# Patient Record
Sex: Male | Born: 1935 | Race: White | Hispanic: No | Marital: Married | Smoking: Former smoker
Health system: Southern US, Community
[De-identification: ages and names within clinical notes are randomized; demographics above are authoritative.]

## PROBLEM LIST (undated history)

## (undated) DIAGNOSIS — I4892 Unspecified atrial flutter: Secondary | ICD-10-CM

## (undated) DIAGNOSIS — M1990 Unspecified osteoarthritis, unspecified site: Secondary | ICD-10-CM

## (undated) DIAGNOSIS — T148XXA Other injury of unspecified body region, initial encounter: Secondary | ICD-10-CM

## (undated) DIAGNOSIS — K219 Gastro-esophageal reflux disease without esophagitis: Secondary | ICD-10-CM

## (undated) DIAGNOSIS — C801 Malignant (primary) neoplasm, unspecified: Secondary | ICD-10-CM

## (undated) DIAGNOSIS — I10 Essential (primary) hypertension: Secondary | ICD-10-CM

## (undated) DIAGNOSIS — E669 Obesity, unspecified: Secondary | ICD-10-CM

## (undated) DIAGNOSIS — I2510 Atherosclerotic heart disease of native coronary artery without angina pectoris: Secondary | ICD-10-CM

## (undated) DIAGNOSIS — C189 Malignant neoplasm of colon, unspecified: Secondary | ICD-10-CM

## (undated) DIAGNOSIS — Z950 Presence of cardiac pacemaker: Secondary | ICD-10-CM

## (undated) DIAGNOSIS — N189 Chronic kidney disease, unspecified: Principal | ICD-10-CM

## (undated) DIAGNOSIS — R569 Unspecified convulsions: Secondary | ICD-10-CM

## (undated) DIAGNOSIS — I259 Chronic ischemic heart disease, unspecified: Secondary | ICD-10-CM

## (undated) DIAGNOSIS — G4730 Sleep apnea, unspecified: Secondary | ICD-10-CM

## (undated) DIAGNOSIS — I495 Sick sinus syndrome: Secondary | ICD-10-CM

## (undated) DIAGNOSIS — K649 Unspecified hemorrhoids: Secondary | ICD-10-CM

## (undated) DIAGNOSIS — D631 Anemia in chronic kidney disease: Principal | ICD-10-CM

## (undated) DIAGNOSIS — N289 Disorder of kidney and ureter, unspecified: Secondary | ICD-10-CM

## (undated) DIAGNOSIS — I209 Angina pectoris, unspecified: Secondary | ICD-10-CM

## (undated) DIAGNOSIS — H269 Unspecified cataract: Secondary | ICD-10-CM

## (undated) DIAGNOSIS — E785 Hyperlipidemia, unspecified: Secondary | ICD-10-CM

## (undated) DIAGNOSIS — I219 Acute myocardial infarction, unspecified: Secondary | ICD-10-CM

## (undated) HISTORY — DX: Hyperlipidemia, unspecified: E78.5

## (undated) HISTORY — DX: Unspecified atrial flutter: I48.92

## (undated) HISTORY — DX: Chronic ischemic heart disease, unspecified: I25.9

## (undated) HISTORY — DX: Obesity, unspecified: E66.9

## (undated) HISTORY — DX: Disorder of kidney and ureter, unspecified: N28.9

## (undated) HISTORY — DX: Atherosclerotic heart disease of native coronary artery without angina pectoris: I25.10

## (undated) HISTORY — DX: Presence of cardiac pacemaker: Z95.0

## (undated) HISTORY — DX: Sick sinus syndrome: I49.5

## (undated) HISTORY — DX: Malignant neoplasm of colon, unspecified: C18.9

## (undated) HISTORY — DX: Chronic kidney disease, unspecified: N18.9

## (undated) HISTORY — PX: FRACTURE SURGERY: SHX138

## (undated) HISTORY — DX: Anemia in chronic kidney disease: D63.1

---

## 1984-05-21 DIAGNOSIS — I219 Acute myocardial infarction, unspecified: Secondary | ICD-10-CM

## 1984-05-21 HISTORY — DX: Acute myocardial infarction, unspecified: I21.9

## 1995-05-22 DIAGNOSIS — I495 Sick sinus syndrome: Secondary | ICD-10-CM

## 1995-05-22 HISTORY — DX: Sick sinus syndrome: I49.5

## 1995-05-22 HISTORY — PX: PACEMAKER REMOVAL: SHX5066

## 1997-09-17 ENCOUNTER — Other Ambulatory Visit: Admission: RE | Admit: 1997-09-17 | Discharge: 1997-09-17 | Payer: Self-pay | Admitting: Cardiology

## 1999-08-29 ENCOUNTER — Inpatient Hospital Stay (HOSPITAL_COMMUNITY): Admission: EM | Admit: 1999-08-29 | Discharge: 1999-08-31 | Payer: Self-pay | Admitting: Emergency Medicine

## 1999-08-29 ENCOUNTER — Encounter: Payer: Self-pay | Admitting: Emergency Medicine

## 2003-05-22 HISTORY — PX: COLON SURGERY: SHX602

## 2003-11-19 DIAGNOSIS — C189 Malignant neoplasm of colon, unspecified: Secondary | ICD-10-CM

## 2003-11-19 HISTORY — DX: Malignant neoplasm of colon, unspecified: C18.9

## 2003-11-23 ENCOUNTER — Ambulatory Visit (HOSPITAL_COMMUNITY): Admission: RE | Admit: 2003-11-23 | Discharge: 2003-11-23 | Payer: Self-pay | Admitting: Gastroenterology

## 2003-11-23 ENCOUNTER — Encounter (INDEPENDENT_AMBULATORY_CARE_PROVIDER_SITE_OTHER): Payer: Self-pay | Admitting: Specialist

## 2003-12-03 ENCOUNTER — Encounter: Admission: RE | Admit: 2003-12-03 | Discharge: 2003-12-03 | Payer: Self-pay | Admitting: General Surgery

## 2003-12-04 HISTORY — PX: LAPAROTOMY: SHX154

## 2003-12-14 ENCOUNTER — Encounter (INDEPENDENT_AMBULATORY_CARE_PROVIDER_SITE_OTHER): Payer: Self-pay | Admitting: *Deleted

## 2003-12-14 ENCOUNTER — Inpatient Hospital Stay (HOSPITAL_COMMUNITY): Admission: RE | Admit: 2003-12-14 | Discharge: 2003-12-21 | Payer: Self-pay | Admitting: General Surgery

## 2003-12-27 ENCOUNTER — Ambulatory Visit (HOSPITAL_COMMUNITY): Admission: RE | Admit: 2003-12-27 | Discharge: 2003-12-27 | Payer: Self-pay | Admitting: General Surgery

## 2004-01-06 ENCOUNTER — Ambulatory Visit (HOSPITAL_COMMUNITY): Admission: RE | Admit: 2004-01-06 | Discharge: 2004-01-06 | Payer: Self-pay | Admitting: Oncology

## 2004-04-04 ENCOUNTER — Ambulatory Visit: Payer: Self-pay | Admitting: Oncology

## 2004-04-10 ENCOUNTER — Ambulatory Visit (HOSPITAL_COMMUNITY): Admission: RE | Admit: 2004-04-10 | Discharge: 2004-04-10 | Payer: Self-pay | Admitting: Oncology

## 2004-05-21 HISTORY — PX: INSERT / REPLACE / REMOVE PACEMAKER: SUR710

## 2004-07-11 ENCOUNTER — Ambulatory Visit: Payer: Self-pay | Admitting: Oncology

## 2004-07-14 ENCOUNTER — Ambulatory Visit (HOSPITAL_COMMUNITY): Admission: RE | Admit: 2004-07-14 | Discharge: 2004-07-14 | Payer: Self-pay | Admitting: Oncology

## 2004-10-25 ENCOUNTER — Ambulatory Visit: Payer: Self-pay | Admitting: Oncology

## 2004-10-27 ENCOUNTER — Ambulatory Visit (HOSPITAL_COMMUNITY): Admission: RE | Admit: 2004-10-27 | Discharge: 2004-10-27 | Payer: Self-pay | Admitting: Oncology

## 2004-11-28 ENCOUNTER — Encounter: Admission: RE | Admit: 2004-11-28 | Discharge: 2004-11-28 | Payer: Self-pay | Admitting: Cardiology

## 2005-01-03 ENCOUNTER — Encounter: Admission: RE | Admit: 2005-01-03 | Discharge: 2005-01-03 | Payer: Self-pay | Admitting: Otolaryngology

## 2005-01-03 ENCOUNTER — Encounter (INDEPENDENT_AMBULATORY_CARE_PROVIDER_SITE_OTHER): Payer: Self-pay | Admitting: Specialist

## 2005-01-03 ENCOUNTER — Other Ambulatory Visit: Admission: RE | Admit: 2005-01-03 | Discharge: 2005-01-03 | Payer: Self-pay | Admitting: Interventional Radiology

## 2005-03-02 ENCOUNTER — Ambulatory Visit (HOSPITAL_COMMUNITY): Admission: RE | Admit: 2005-03-02 | Discharge: 2005-03-02 | Payer: Self-pay | Admitting: *Deleted

## 2005-04-20 ENCOUNTER — Ambulatory Visit: Payer: Self-pay | Admitting: Oncology

## 2005-04-25 ENCOUNTER — Ambulatory Visit (HOSPITAL_COMMUNITY): Admission: RE | Admit: 2005-04-25 | Discharge: 2005-04-25 | Payer: Self-pay | Admitting: Urology

## 2005-04-30 ENCOUNTER — Ambulatory Visit: Admission: RE | Admit: 2005-04-30 | Discharge: 2005-07-11 | Payer: Self-pay | Admitting: Radiation Oncology

## 2005-05-21 HISTORY — PX: RADIOACTIVE SEED IMPLANT: SHX5150

## 2005-06-06 ENCOUNTER — Ambulatory Visit (HOSPITAL_BASED_OUTPATIENT_CLINIC_OR_DEPARTMENT_OTHER): Admission: RE | Admit: 2005-06-06 | Discharge: 2005-06-06 | Payer: Self-pay | Admitting: Urology

## 2005-10-17 ENCOUNTER — Ambulatory Visit: Payer: Self-pay | Admitting: Oncology

## 2005-10-23 LAB — COMPREHENSIVE METABOLIC PANEL
Albumin: 4 g/dL (ref 3.5–5.2)
BUN: 23 mg/dL (ref 6–23)
CO2: 25 mEq/L (ref 19–32)
Calcium: 8.8 mg/dL (ref 8.4–10.5)
Chloride: 99 mEq/L (ref 96–112)
Glucose, Bld: 325 mg/dL — ABNORMAL HIGH (ref 70–99)
Potassium: 4.7 mEq/L (ref 3.5–5.3)

## 2005-10-23 LAB — CBC WITH DIFFERENTIAL/PLATELET
Basophils Absolute: 0 10*3/uL (ref 0.0–0.1)
Eosinophils Absolute: 0.1 10*3/uL (ref 0.0–0.5)
HGB: 12.8 g/dL — ABNORMAL LOW (ref 13.0–17.1)
MCV: 91.4 fL (ref 81.6–98.0)
MONO#: 0.6 10*3/uL (ref 0.1–0.9)
MONO%: 6.6 % (ref 0.0–13.0)
NEUT#: 7 10*3/uL — ABNORMAL HIGH (ref 1.5–6.5)
RDW: 13 % (ref 11.2–14.6)
WBC: 8.7 10*3/uL (ref 4.0–10.0)
lymph#: 1 10*3/uL (ref 0.9–3.3)

## 2005-10-23 LAB — CEA: CEA: 2.6 ng/mL (ref 0.0–5.0)

## 2005-10-23 LAB — LACTATE DEHYDROGENASE: LDH: 137 U/L (ref 94–250)

## 2005-10-25 ENCOUNTER — Ambulatory Visit (HOSPITAL_COMMUNITY): Admission: RE | Admit: 2005-10-25 | Discharge: 2005-10-25 | Payer: Self-pay | Admitting: Oncology

## 2005-10-30 ENCOUNTER — Ambulatory Visit: Payer: Self-pay | Admitting: Oncology

## 2006-03-20 ENCOUNTER — Inpatient Hospital Stay (HOSPITAL_COMMUNITY): Admission: RE | Admit: 2006-03-20 | Discharge: 2006-03-21 | Payer: Self-pay | Admitting: Cardiovascular Disease

## 2006-05-01 ENCOUNTER — Inpatient Hospital Stay (HOSPITAL_COMMUNITY): Admission: EM | Admit: 2006-05-01 | Discharge: 2006-05-12 | Payer: Self-pay | Admitting: Emergency Medicine

## 2006-05-01 ENCOUNTER — Encounter: Payer: Self-pay | Admitting: Vascular Surgery

## 2006-05-01 HISTORY — PX: CARDIAC CATHETERIZATION: SHX172

## 2006-05-06 DIAGNOSIS — I259 Chronic ischemic heart disease, unspecified: Secondary | ICD-10-CM

## 2006-05-06 HISTORY — PX: CORONARY ARTERY BYPASS GRAFT: SHX141

## 2006-05-06 HISTORY — DX: Chronic ischemic heart disease, unspecified: I25.9

## 2006-06-27 ENCOUNTER — Ambulatory Visit: Payer: Self-pay | Admitting: Cardiothoracic Surgery

## 2006-07-05 ENCOUNTER — Ambulatory Visit (HOSPITAL_COMMUNITY): Admission: RE | Admit: 2006-07-05 | Discharge: 2006-07-05 | Payer: Self-pay | Admitting: Cardiology

## 2006-10-04 ENCOUNTER — Ambulatory Visit: Payer: Self-pay | Admitting: Oncology

## 2006-10-08 LAB — COMPREHENSIVE METABOLIC PANEL
ALT: 14 U/L (ref 0–53)
AST: 15 U/L (ref 0–37)
CO2: 28 mEq/L (ref 19–32)
Sodium: 142 mEq/L (ref 135–145)
Total Bilirubin: 0.7 mg/dL (ref 0.3–1.2)
Total Protein: 6.7 g/dL (ref 6.0–8.3)

## 2006-10-08 LAB — CBC WITH DIFFERENTIAL/PLATELET
BASO%: 0.4 % (ref 0.0–2.0)
LYMPH%: 13.7 % — ABNORMAL LOW (ref 14.0–48.0)
MCHC: 34.7 g/dL (ref 32.0–35.9)
MONO#: 0.5 10*3/uL (ref 0.1–0.9)
Platelets: 156 10*3/uL (ref 145–400)
RBC: 3.57 10*6/uL — ABNORMAL LOW (ref 4.20–5.71)
WBC: 5.4 10*3/uL (ref 4.0–10.0)
lymph#: 0.7 10*3/uL — ABNORMAL LOW (ref 0.9–3.3)

## 2006-10-15 ENCOUNTER — Ambulatory Visit: Payer: Self-pay | Admitting: Oncology

## 2006-10-16 LAB — CBC WITH DIFFERENTIAL (CANCER CENTER ONLY)
BASO#: 0 10*3/uL (ref 0.0–0.2)
Eosinophils Absolute: 0.1 10*3/uL (ref 0.0–0.5)
HGB: 11.2 g/dL — ABNORMAL LOW (ref 13.0–17.1)
MCH: 30.9 pg (ref 28.0–33.4)
MCV: 90 fL (ref 82–98)
MONO%: 6.7 % (ref 0.0–13.0)
NEUT#: 3.8 10*3/uL (ref 1.5–6.5)
RBC: 3.62 10*6/uL — ABNORMAL LOW (ref 4.20–5.70)

## 2006-10-17 LAB — COMPREHENSIVE METABOLIC PANEL
BUN: 23 mg/dL (ref 6–23)
CO2: 28 mEq/L (ref 19–32)
Creatinine, Ser: 1.48 mg/dL (ref 0.40–1.50)
Glucose, Bld: 107 mg/dL — ABNORMAL HIGH (ref 70–99)
Total Bilirubin: 1 mg/dL (ref 0.3–1.2)

## 2006-10-17 LAB — IRON AND TIBC
%SAT: 18 % — ABNORMAL LOW (ref 20–55)
Iron: 69 ug/dL (ref 42–165)
TIBC: 389 ug/dL (ref 215–435)

## 2006-10-17 LAB — VITAMIN B12: Vitamin B-12: 489 pg/mL (ref 211–911)

## 2006-10-17 LAB — CEA: CEA: 2.7 ng/mL (ref 0.0–5.0)

## 2007-02-20 ENCOUNTER — Encounter: Admission: RE | Admit: 2007-02-20 | Discharge: 2007-02-20 | Payer: Self-pay | Admitting: Gastroenterology

## 2007-02-20 ENCOUNTER — Inpatient Hospital Stay (HOSPITAL_COMMUNITY): Admission: AD | Admit: 2007-02-20 | Discharge: 2007-02-22 | Payer: Self-pay | Admitting: Gastroenterology

## 2007-04-03 ENCOUNTER — Ambulatory Visit: Payer: Self-pay | Admitting: Oncology

## 2007-04-07 LAB — CBC WITH DIFFERENTIAL/PLATELET
BASO%: 0.6 % (ref 0.0–2.0)
LYMPH%: 13.6 % — ABNORMAL LOW (ref 14.0–48.0)
MCHC: 35 g/dL (ref 32.0–35.9)
MCV: 91.5 fL (ref 81.6–98.0)
MONO%: 8.5 % (ref 0.0–13.0)
Platelets: 166 10*3/uL (ref 145–400)
RBC: 3.52 10*6/uL — ABNORMAL LOW (ref 4.20–5.71)

## 2007-04-07 LAB — COMPREHENSIVE METABOLIC PANEL
Alkaline Phosphatase: 62 U/L (ref 39–117)
Creatinine, Ser: 1.99 mg/dL — ABNORMAL HIGH (ref 0.40–1.50)
Glucose, Bld: 123 mg/dL — ABNORMAL HIGH (ref 70–99)
Sodium: 141 mEq/L (ref 135–145)
Total Bilirubin: 0.6 mg/dL (ref 0.3–1.2)
Total Protein: 6.8 g/dL (ref 6.0–8.3)

## 2007-04-09 ENCOUNTER — Ambulatory Visit (HOSPITAL_COMMUNITY): Admission: RE | Admit: 2007-04-09 | Discharge: 2007-04-09 | Payer: Self-pay | Admitting: Oncology

## 2007-04-15 ENCOUNTER — Ambulatory Visit: Payer: Self-pay | Admitting: Oncology

## 2007-10-02 ENCOUNTER — Ambulatory Visit: Payer: Self-pay | Admitting: Oncology

## 2007-10-06 LAB — CBC WITH DIFFERENTIAL/PLATELET
BASO%: 0.4 % (ref 0.0–2.0)
Basophils Absolute: 0 10*3/uL (ref 0.0–0.1)
Eosinophils Absolute: 0.2 10*3/uL (ref 0.0–0.5)
HCT: 32.9 % — ABNORMAL LOW (ref 38.7–49.9)
HGB: 11.4 g/dL — ABNORMAL LOW (ref 13.0–17.1)
LYMPH%: 15.7 % (ref 14.0–48.0)
MCHC: 34.7 g/dL (ref 32.0–35.9)
MONO#: 0.6 10*3/uL (ref 0.1–0.9)
NEUT%: 72.4 % (ref 40.0–75.0)
Platelets: 160 10*3/uL (ref 145–400)
WBC: 6.4 10*3/uL (ref 4.0–10.0)

## 2007-10-06 LAB — COMPREHENSIVE METABOLIC PANEL
ALT: 14 U/L (ref 0–53)
Albumin: 4.1 g/dL (ref 3.5–5.2)
Alkaline Phosphatase: 50 U/L (ref 39–117)
CO2: 26 mEq/L (ref 19–32)
Glucose, Bld: 107 mg/dL — ABNORMAL HIGH (ref 70–99)
Potassium: 4.3 mEq/L (ref 3.5–5.3)
Sodium: 139 mEq/L (ref 135–145)
Total Protein: 6.9 g/dL (ref 6.0–8.3)

## 2007-10-06 LAB — CEA: CEA: 3.8 ng/mL (ref 0.0–5.0)

## 2007-10-15 ENCOUNTER — Ambulatory Visit: Payer: Self-pay | Admitting: Oncology

## 2007-10-16 LAB — PSA: PSA: 13.19 ng/mL — ABNORMAL HIGH (ref 0.10–4.00)

## 2007-10-16 LAB — IRON AND TIBC: %SAT: 26 % (ref 20–55)

## 2007-11-04 ENCOUNTER — Ambulatory Visit (HOSPITAL_COMMUNITY): Admission: RE | Admit: 2007-11-04 | Discharge: 2007-11-04 | Payer: Self-pay | Admitting: Oncology

## 2007-11-20 LAB — CBC WITH DIFFERENTIAL (CANCER CENTER ONLY)
BASO#: 0 10*3/uL (ref 0.0–0.2)
EOS%: 2.5 % (ref 0.0–7.0)
Eosinophils Absolute: 0.2 10*3/uL (ref 0.0–0.5)
HGB: 11.4 g/dL — ABNORMAL LOW (ref 13.0–17.1)
LYMPH#: 0.9 10*3/uL (ref 0.9–3.3)
MONO#: 0.4 10*3/uL (ref 0.1–0.9)
NEUT#: 4.4 10*3/uL (ref 1.5–6.5)
RBC: 3.66 10*6/uL — ABNORMAL LOW (ref 4.20–5.70)

## 2008-03-02 ENCOUNTER — Observation Stay (HOSPITAL_COMMUNITY): Admission: EM | Admit: 2008-03-02 | Discharge: 2008-03-03 | Payer: Self-pay | Admitting: Emergency Medicine

## 2008-04-01 ENCOUNTER — Ambulatory Visit: Payer: Self-pay | Admitting: Oncology

## 2008-04-05 LAB — CBC WITH DIFFERENTIAL/PLATELET
BASO%: 0.7 % (ref 0.0–2.0)
EOS%: 2.5 % (ref 0.0–7.0)
MCH: 31.6 pg (ref 28.0–33.4)
MCHC: 34.5 g/dL (ref 32.0–35.9)
NEUT%: 76.3 % — ABNORMAL HIGH (ref 40.0–75.0)
RDW: 13.5 % (ref 11.2–14.6)
lymph#: 0.9 10*3/uL (ref 0.9–3.3)

## 2008-04-06 LAB — COMPREHENSIVE METABOLIC PANEL
ALT: 17 U/L (ref 0–53)
AST: 19 U/L (ref 0–37)
Calcium: 9.4 mg/dL (ref 8.4–10.5)
Chloride: 101 mEq/L (ref 96–112)
Creatinine, Ser: 1.99 mg/dL — ABNORMAL HIGH (ref 0.40–1.50)
Potassium: 4.7 mEq/L (ref 3.5–5.3)

## 2008-04-07 ENCOUNTER — Ambulatory Visit: Payer: Self-pay | Admitting: Oncology

## 2008-09-30 ENCOUNTER — Ambulatory Visit: Payer: Self-pay | Admitting: Oncology

## 2008-10-04 LAB — CBC WITH DIFFERENTIAL/PLATELET
Eosinophils Absolute: 0.2 10*3/uL (ref 0.0–0.5)
HCT: 30.4 % — ABNORMAL LOW (ref 38.4–49.9)
LYMPH%: 15.2 % (ref 14.0–49.0)
MONO#: 0.6 10*3/uL (ref 0.1–0.9)
NEUT#: 5.5 10*3/uL (ref 1.5–6.5)
NEUT%: 74.1 % (ref 39.0–75.0)
Platelets: 152 10*3/uL (ref 140–400)
WBC: 7.4 10*3/uL (ref 4.0–10.3)
lymph#: 1.1 10*3/uL (ref 0.9–3.3)
nRBC: 0 % (ref 0–0)

## 2008-10-04 LAB — COMPREHENSIVE METABOLIC PANEL
AST: 18 U/L (ref 0–37)
BUN: 41 mg/dL — ABNORMAL HIGH (ref 6–23)
Calcium: 9.7 mg/dL (ref 8.4–10.5)
Chloride: 102 mEq/L (ref 96–112)
Creatinine, Ser: 1.9 mg/dL — ABNORMAL HIGH (ref 0.40–1.50)

## 2008-10-04 LAB — CEA: CEA: 3 ng/mL (ref 0.0–5.0)

## 2008-10-06 ENCOUNTER — Ambulatory Visit: Payer: Self-pay | Admitting: Oncology

## 2008-11-09 LAB — PSA: PSA: 0.57 ng/mL (ref 0.10–4.00)

## 2008-11-23 ENCOUNTER — Ambulatory Visit: Payer: Self-pay | Admitting: Oncology

## 2008-11-25 LAB — CBC WITH DIFFERENTIAL (CANCER CENTER ONLY)
BASO%: 0.3 % (ref 0.0–2.0)
EOS%: 2.7 % (ref 0.0–7.0)
HCT: 30.2 % — ABNORMAL LOW (ref 38.7–49.9)
LYMPH#: 1.2 10*3/uL (ref 0.9–3.3)
LYMPH%: 15.6 % (ref 14.0–48.0)
MCHC: 35.4 g/dL (ref 32.0–35.9)
NEUT%: 73.5 % (ref 40.0–80.0)
Platelets: 193 10*3/uL (ref 145–400)
RDW: 12.9 % (ref 10.5–14.6)

## 2008-12-16 LAB — CBC WITH DIFFERENTIAL (CANCER CENTER ONLY)
BASO%: 0.3 % (ref 0.0–2.0)
EOS%: 2.3 % (ref 0.0–7.0)
HGB: 11.3 g/dL — ABNORMAL LOW (ref 13.0–17.1)
LYMPH#: 0.9 10*3/uL (ref 0.9–3.3)
MCHC: 34.6 g/dL (ref 32.0–35.9)
NEUT#: 5.7 10*3/uL (ref 1.5–6.5)
RDW: 13.2 % (ref 10.5–14.6)
WBC: 7.3 10*3/uL (ref 4.0–10.0)

## 2009-01-04 ENCOUNTER — Ambulatory Visit: Payer: Self-pay | Admitting: Oncology

## 2009-01-05 LAB — CBC WITH DIFFERENTIAL (CANCER CENTER ONLY)
BASO%: 0.4 % (ref 0.0–2.0)
HCT: 33.4 % — ABNORMAL LOW (ref 38.7–49.9)
LYMPH%: 15.3 % (ref 14.0–48.0)
MCH: 29.2 pg (ref 28.0–33.4)
MCHC: 34.6 g/dL (ref 32.0–35.9)
MCV: 85 fL (ref 82–98)
MONO#: 0.5 10*3/uL (ref 0.1–0.9)
MONO%: 7.2 % (ref 0.0–13.0)
NEUT%: 74.5 % (ref 40.0–80.0)
Platelets: 217 10*3/uL (ref 145–400)
RDW: 13.2 % (ref 10.5–14.6)
WBC: 7.1 10*3/uL (ref 4.0–10.0)

## 2009-01-05 LAB — CMP (CANCER CENTER ONLY)
Alkaline Phosphatase: 70 U/L (ref 26–84)
BUN, Bld: 38 mg/dL — ABNORMAL HIGH (ref 7–22)
Creat: 1.9 mg/dl — ABNORMAL HIGH (ref 0.6–1.2)
Glucose, Bld: 91 mg/dL (ref 73–118)
Total Bilirubin: 0.5 mg/dl (ref 0.20–1.60)

## 2009-01-28 LAB — CBC WITH DIFFERENTIAL (CANCER CENTER ONLY)
BASO%: 0.5 % (ref 0.0–2.0)
EOS%: 2.7 % (ref 0.0–7.0)
LYMPH#: 1.2 10*3/uL (ref 0.9–3.3)
MCHC: 33.1 g/dL (ref 32.0–35.9)
NEUT#: 5.1 10*3/uL (ref 1.5–6.5)
RDW: 14.1 % (ref 10.5–14.6)

## 2009-02-14 ENCOUNTER — Ambulatory Visit: Payer: Self-pay | Admitting: Oncology

## 2009-02-17 LAB — CBC WITH DIFFERENTIAL (CANCER CENTER ONLY)
BASO%: 0.4 % (ref 0.0–2.0)
EOS%: 2.9 % (ref 0.0–7.0)
HCT: 33.3 % — ABNORMAL LOW (ref 38.7–49.9)
LYMPH%: 14 % (ref 14.0–48.0)
MCH: 28.8 pg (ref 28.0–33.4)
MCHC: 33.3 g/dL (ref 32.0–35.9)
MCV: 86 fL (ref 82–98)
MONO%: 7.8 % (ref 0.0–13.0)
NEUT%: 74.9 % (ref 40.0–80.0)
RDW: 14.3 % (ref 10.5–14.6)

## 2009-03-10 LAB — CBC WITH DIFFERENTIAL (CANCER CENTER ONLY)
BASO%: 0.4 % (ref 0.0–2.0)
HCT: 34.4 % — ABNORMAL LOW (ref 38.7–49.9)
LYMPH%: 14.6 % (ref 14.0–48.0)
MCV: 84 fL (ref 82–98)
MONO#: 0.6 10*3/uL (ref 0.1–0.9)
NEUT%: 74.3 % (ref 40.0–80.0)
RDW: 15 % — ABNORMAL HIGH (ref 10.5–14.6)
WBC: 6.9 10*3/uL (ref 4.0–10.0)

## 2009-03-10 LAB — BASIC METABOLIC PANEL - CANCER CENTER ONLY
BUN, Bld: 38 mg/dL — ABNORMAL HIGH (ref 7–22)
CO2: 29 mEq/L (ref 18–33)
Chloride: 101 mEq/L (ref 98–108)
Glucose, Bld: 125 mg/dL — ABNORMAL HIGH (ref 73–118)
Potassium: 4.6 mEq/L (ref 3.3–4.7)

## 2009-03-28 ENCOUNTER — Ambulatory Visit: Payer: Self-pay | Admitting: Oncology

## 2009-03-31 LAB — CBC WITH DIFFERENTIAL (CANCER CENTER ONLY)
Eosinophils Absolute: 0.2 10*3/uL (ref 0.0–0.5)
LYMPH%: 17.5 % (ref 14.0–48.0)
MONO#: 0.6 10*3/uL (ref 0.1–0.9)
NEUT%: 70.1 % (ref 40.0–80.0)
Platelets: 201 10*3/uL (ref 145–400)

## 2009-04-21 LAB — CBC WITH DIFFERENTIAL (CANCER CENTER ONLY)
BASO#: 0 10*3/uL (ref 0.0–0.2)
BASO%: 0.3 % (ref 0.0–2.0)
EOS%: 2.8 % (ref 0.0–7.0)
HCT: 31.7 % — ABNORMAL LOW (ref 38.7–49.9)
HGB: 10.5 g/dL — ABNORMAL LOW (ref 13.0–17.1)
LYMPH%: 13.9 % — ABNORMAL LOW (ref 14.0–48.0)
MCH: 27.8 pg — ABNORMAL LOW (ref 28.0–33.4)
MCHC: 33.2 g/dL (ref 32.0–35.9)
MCV: 84 fL (ref 82–98)
MONO%: 7.5 % (ref 0.0–13.0)
NEUT%: 75.5 % (ref 40.0–80.0)
RDW: 17.1 % — ABNORMAL HIGH (ref 10.5–14.6)

## 2009-05-10 ENCOUNTER — Ambulatory Visit: Payer: Self-pay | Admitting: Oncology

## 2009-05-12 LAB — CBC WITH DIFFERENTIAL (CANCER CENTER ONLY)
BASO#: 0 10*3/uL (ref 0.0–0.2)
BASO%: 0.3 % (ref 0.0–2.0)
Eosinophils Absolute: 0.2 10*3/uL (ref 0.0–0.5)
HCT: 33 % — ABNORMAL LOW (ref 38.7–49.9)
HGB: 10.7 g/dL — ABNORMAL LOW (ref 13.0–17.1)
LYMPH%: 14.8 % (ref 14.0–48.0)
MCV: 84 fL (ref 82–98)
MONO#: 0.5 10*3/uL (ref 0.1–0.9)
NEUT%: 73.7 % (ref 40.0–80.0)
RDW: 17.7 % — ABNORMAL HIGH (ref 10.5–14.6)
WBC: 6.3 10*3/uL (ref 4.0–10.0)

## 2009-06-02 LAB — CBC WITH DIFFERENTIAL (CANCER CENTER ONLY)
BASO#: 0 10*3/uL (ref 0.0–0.2)
Eosinophils Absolute: 0.2 10*3/uL (ref 0.0–0.5)
HCT: 33.1 % — ABNORMAL LOW (ref 38.7–49.9)
LYMPH#: 0.9 10*3/uL (ref 0.9–3.3)
LYMPH%: 13.4 % — ABNORMAL LOW (ref 14.0–48.0)
MCV: 84 fL (ref 82–98)
MONO%: 5.2 % (ref 0.0–13.0)
NEUT#: 5.1 10*3/uL (ref 1.5–6.5)
NEUT%: 78.4 % (ref 40.0–80.0)
Platelets: 196 10*3/uL (ref 145–400)
WBC: 6.5 10*3/uL (ref 4.0–10.0)

## 2009-06-02 LAB — BASIC METABOLIC PANEL - CANCER CENTER ONLY
CO2: 27 mEq/L (ref 18–33)
Chloride: 104 mEq/L (ref 98–108)
Creat: 2.2 mg/dl — ABNORMAL HIGH (ref 0.6–1.2)
Sodium: 143 mEq/L (ref 128–145)

## 2009-06-21 ENCOUNTER — Ambulatory Visit: Payer: Self-pay | Admitting: Oncology

## 2009-06-23 LAB — CBC WITH DIFFERENTIAL (CANCER CENTER ONLY)
BASO#: 0 10*3/uL (ref 0.0–0.2)
HCT: 31.3 % — ABNORMAL LOW (ref 38.7–49.9)
HGB: 10.4 g/dL — ABNORMAL LOW (ref 13.0–17.1)
LYMPH#: 0.9 10*3/uL (ref 0.9–3.3)
LYMPH%: 14.8 % (ref 14.0–48.0)
MCHC: 33.1 g/dL (ref 32.0–35.9)
MCV: 83 fL (ref 82–98)
MONO#: 0.4 10*3/uL (ref 0.1–0.9)
NEUT%: 74.5 % (ref 40.0–80.0)
RDW: 17 % — ABNORMAL HIGH (ref 10.5–14.6)
WBC: 5.8 10*3/uL (ref 4.0–10.0)

## 2009-07-14 LAB — CBC WITH DIFFERENTIAL (CANCER CENTER ONLY)
BASO#: 0 10*3/uL (ref 0.0–0.2)
BASO%: 0.3 % (ref 0.0–2.0)
EOS%: 2.9 % (ref 0.0–7.0)
HCT: 31.3 % — ABNORMAL LOW (ref 38.7–49.9)
HGB: 10.6 g/dL — ABNORMAL LOW (ref 13.0–17.1)
LYMPH%: 14.5 % (ref 14.0–48.0)
MCH: 28.3 pg (ref 28.0–33.4)
MCHC: 33.8 g/dL (ref 32.0–35.9)
MONO%: 5.9 % (ref 0.0–13.0)
NEUT%: 76.4 % (ref 40.0–80.0)
RDW: 19.9 % — ABNORMAL HIGH (ref 10.5–14.6)

## 2009-08-02 ENCOUNTER — Ambulatory Visit: Payer: Self-pay | Admitting: Oncology

## 2009-08-04 LAB — CBC WITH DIFFERENTIAL (CANCER CENTER ONLY)
BASO%: 0.3 % (ref 0.0–2.0)
Eosinophils Absolute: 0.2 10*3/uL (ref 0.0–0.5)
HCT: 31.5 % — ABNORMAL LOW (ref 38.7–49.9)
LYMPH#: 0.9 10*3/uL (ref 0.9–3.3)
LYMPH%: 13.5 % — ABNORMAL LOW (ref 14.0–48.0)
MCV: 83 fL (ref 82–98)
MONO#: 0.5 10*3/uL (ref 0.1–0.9)
NEUT%: 76.4 % (ref 40.0–80.0)
RBC: 3.78 10*6/uL — ABNORMAL LOW (ref 4.20–5.70)
RDW: 18.7 % — ABNORMAL HIGH (ref 10.5–14.6)
WBC: 6.4 10*3/uL (ref 4.0–10.0)

## 2009-08-25 LAB — CBC WITH DIFFERENTIAL (CANCER CENTER ONLY)
BASO#: 0 10*3/uL (ref 0.0–0.2)
BASO%: 0.3 % (ref 0.0–2.0)
EOS%: 2.9 % (ref 0.0–7.0)
HCT: 31.8 % — ABNORMAL LOW (ref 38.7–49.9)
LYMPH#: 1 10*3/uL (ref 0.9–3.3)
MCH: 27.8 pg — ABNORMAL LOW (ref 28.0–33.4)
MCHC: 34 g/dL (ref 32.0–35.9)
MONO%: 7.2 % (ref 0.0–13.0)
NEUT%: 74.5 % (ref 40.0–80.0)
RDW: 17.9 % — ABNORMAL HIGH (ref 10.5–14.6)

## 2009-09-08 ENCOUNTER — Ambulatory Visit: Payer: Self-pay | Admitting: Oncology

## 2009-09-15 LAB — CBC WITH DIFFERENTIAL (CANCER CENTER ONLY)
BASO%: 0.4 % (ref 0.0–2.0)
EOS%: 2.5 % (ref 0.0–7.0)
Eosinophils Absolute: 0.2 10*3/uL (ref 0.0–0.5)
LYMPH%: 14.9 % (ref 14.0–48.0)
MCH: 27 pg — ABNORMAL LOW (ref 28.0–33.4)
MCHC: 33 g/dL (ref 32.0–35.9)
MCV: 82 fL (ref 82–98)
MONO%: 5.8 % (ref 0.0–13.0)
NEUT#: 5.1 10*3/uL (ref 1.5–6.5)
Platelets: 214 10*3/uL (ref 145–400)
RBC: 3.95 10*6/uL — ABNORMAL LOW (ref 4.20–5.70)
RDW: 18 % — ABNORMAL HIGH (ref 10.5–14.6)

## 2009-09-15 LAB — BASIC METABOLIC PANEL - CANCER CENTER ONLY
Calcium: 9.3 mg/dL (ref 8.0–10.3)
Glucose, Bld: 172 mg/dL — ABNORMAL HIGH (ref 73–118)
Potassium: 4.6 mEq/L (ref 3.3–4.7)
Sodium: 139 mEq/L (ref 128–145)

## 2009-10-06 LAB — CBC WITH DIFFERENTIAL (CANCER CENTER ONLY)
BASO#: 0 10*3/uL (ref 0.0–0.2)
BASO%: 0.4 % (ref 0.0–2.0)
EOS%: 2.4 % (ref 0.0–7.0)
HCT: 27.5 % — ABNORMAL LOW (ref 38.7–49.9)
HGB: 9.4 g/dL — ABNORMAL LOW (ref 13.0–17.1)
LYMPH%: 16.4 % (ref 14.0–48.0)
MCHC: 34.3 g/dL (ref 32.0–35.9)
MCV: 80 fL — ABNORMAL LOW (ref 82–98)
MONO#: 0.5 10*3/uL (ref 0.1–0.9)
NEUT%: 72.5 % (ref 40.0–80.0)
RDW: 17.2 % — ABNORMAL HIGH (ref 10.5–14.6)

## 2009-10-11 ENCOUNTER — Ambulatory Visit: Payer: Self-pay | Admitting: Oncology

## 2009-10-27 LAB — CBC WITH DIFFERENTIAL (CANCER CENTER ONLY)
Eosinophils Absolute: 0.2 10*3/uL (ref 0.0–0.5)
HCT: 31.1 % — ABNORMAL LOW (ref 38.7–49.9)
LYMPH%: 13.6 % — ABNORMAL LOW (ref 14.0–48.0)
MCH: 26.8 pg — ABNORMAL LOW (ref 28.0–33.4)
MCV: 83 fL (ref 82–98)
MONO#: 0.6 10*3/uL (ref 0.1–0.9)
MONO%: 8.1 % (ref 0.0–13.0)
NEUT%: 74.8 % (ref 40.0–80.0)
Platelets: 243 10*3/uL (ref 145–400)
RBC: 3.77 10*6/uL — ABNORMAL LOW (ref 4.20–5.70)
RDW: 16.6 % — ABNORMAL HIGH (ref 10.5–14.6)
WBC: 6.8 10*3/uL (ref 4.0–10.0)

## 2009-11-07 ENCOUNTER — Ambulatory Visit: Payer: Self-pay | Admitting: Oncology

## 2009-11-17 LAB — CBC WITH DIFFERENTIAL (CANCER CENTER ONLY)
BASO%: 0.3 % (ref 0.0–2.0)
EOS%: 3 % (ref 0.0–7.0)
HCT: 27.2 % — ABNORMAL LOW (ref 38.7–49.9)
LYMPH%: 13.7 % — ABNORMAL LOW (ref 14.0–48.0)
MCHC: 33.3 g/dL (ref 32.0–35.9)
MCV: 82 fL (ref 82–98)
MONO%: 7.7 % (ref 0.0–13.0)
NEUT%: 75.3 % (ref 40.0–80.0)
Platelets: 202 10*3/uL (ref 145–400)
RDW: 18.4 % — ABNORMAL HIGH (ref 10.5–14.6)

## 2009-11-21 LAB — KAPPA/LAMBDA LIGHT CHAINS
Kappa free light chain: 2.1 mg/dL — ABNORMAL HIGH (ref 0.33–1.94)
Lambda Free Lght Chn: 1.58 mg/dL (ref 0.57–2.63)

## 2009-11-21 LAB — DIRECT ANTIGLOBULIN TEST (NOT AT ARMC): DAT IgG: NEGATIVE

## 2009-11-21 LAB — PROTEIN ELECTROPHORESIS, SERUM
Albumin ELP: 52.1 % — ABNORMAL LOW (ref 55.8–66.1)
Alpha-1-Globulin: 5.4 % — ABNORMAL HIGH (ref 2.9–4.9)
Gamma Globulin: 15.6 % (ref 11.1–18.8)
Total Protein, Serum Electrophoresis: 6.9 g/dL (ref 6.0–8.3)

## 2009-11-21 LAB — FERRITIN: Ferritin: 12 ng/mL — ABNORMAL LOW (ref 22–322)

## 2009-11-21 LAB — VITAMIN B12: Vitamin B-12: 1515 pg/mL — ABNORMAL HIGH (ref 211–911)

## 2009-11-21 LAB — ERYTHROPOIETIN: Erythropoietin: 36.3 m[IU]/mL — ABNORMAL HIGH (ref 2.6–34.0)

## 2009-11-30 ENCOUNTER — Ambulatory Visit: Payer: Self-pay | Admitting: Oncology

## 2009-12-08 LAB — CBC WITH DIFFERENTIAL/PLATELET
Basophils Absolute: 0 10*3/uL (ref 0.0–0.1)
Eosinophils Absolute: 0.1 10*3/uL (ref 0.0–0.5)
HGB: 10.4 g/dL — ABNORMAL LOW (ref 13.0–17.1)
LYMPH%: 10.6 % — ABNORMAL LOW (ref 14.0–49.0)
MCV: 84.4 fL (ref 79.3–98.0)
MONO#: 0.6 10*3/uL (ref 0.1–0.9)
MONO%: 9.9 % (ref 0.0–14.0)
NEUT#: 4.9 10*3/uL (ref 1.5–6.5)
Platelets: 181 10*3/uL (ref 140–400)
RBC: 3.66 10*6/uL — ABNORMAL LOW (ref 4.20–5.82)
WBC: 6.4 10*3/uL (ref 4.0–10.3)

## 2009-12-29 LAB — URINALYSIS, MICROSCOPIC (CHCC SATELLITE)
Glucose: NEGATIVE g/dL
RBC: NEGATIVE (ref 0–2)

## 2009-12-29 LAB — CBC WITH DIFFERENTIAL (CANCER CENTER ONLY)
Eosinophils Absolute: 0.2 10*3/uL (ref 0.0–0.5)
HGB: 12.2 g/dL — ABNORMAL LOW (ref 13.0–17.1)
LYMPH#: 0.7 10*3/uL — ABNORMAL LOW (ref 0.9–3.3)
LYMPH%: 11.5 % — ABNORMAL LOW (ref 14.0–48.0)
MCH: 29.6 pg (ref 28.0–33.4)
MCHC: 34.1 g/dL (ref 32.0–35.9)
MONO#: 0.4 10*3/uL (ref 0.1–0.9)
MONO%: 6.9 % (ref 0.0–13.0)
NEUT#: 4.5 10*3/uL (ref 1.5–6.5)
Platelets: 181 10*3/uL (ref 145–400)
RDW: 21.5 % — ABNORMAL HIGH (ref 10.5–14.6)

## 2010-01-02 ENCOUNTER — Ambulatory Visit: Payer: Self-pay | Admitting: Cardiology

## 2010-01-13 ENCOUNTER — Ambulatory Visit: Payer: Self-pay | Admitting: Oncology

## 2010-01-15 ENCOUNTER — Ambulatory Visit: Payer: Self-pay | Admitting: Cardiology

## 2010-01-19 LAB — CBC WITH DIFFERENTIAL (CANCER CENTER ONLY)
BASO%: 0.3 % (ref 0.0–2.0)
Eosinophils Absolute: 0.2 10*3/uL (ref 0.0–0.5)
HCT: 32.8 % — ABNORMAL LOW (ref 38.7–49.9)
LYMPH#: 0.7 10*3/uL — ABNORMAL LOW (ref 0.9–3.3)
LYMPH%: 13.3 % — ABNORMAL LOW (ref 14.0–48.0)
NEUT%: 76.4 % (ref 40.0–80.0)
Platelets: 160 10*3/uL (ref 145–400)
RBC: 3.74 10*6/uL — ABNORMAL LOW (ref 4.20–5.70)
WBC: 5.6 10*3/uL (ref 4.0–10.0)

## 2010-01-20 LAB — IRON AND TIBC: UIBC: 199 ug/dL

## 2010-01-20 LAB — FERRITIN: Ferritin: 212 ng/mL (ref 22–322)

## 2010-02-02 ENCOUNTER — Ambulatory Visit: Payer: Self-pay | Admitting: Cardiology

## 2010-02-09 LAB — CMP (CANCER CENTER ONLY)
ALT(SGPT): 24 U/L (ref 10–47)
Albumin: 3.4 g/dL (ref 3.3–5.5)
Alkaline Phosphatase: 77 U/L (ref 26–84)
BUN, Bld: 36 mg/dL — ABNORMAL HIGH (ref 7–22)
CO2: 25 mEq/L (ref 18–33)
Calcium: 8.6 mg/dL (ref 8.0–10.3)
Chloride: 98 mEq/L (ref 98–108)
Creat: 1.7 mg/dl — ABNORMAL HIGH (ref 0.6–1.2)
Glucose, Bld: 141 mg/dL — ABNORMAL HIGH (ref 73–118)
Potassium: 4.5 mEq/L (ref 3.3–4.7)
Sodium: 141 mEq/L (ref 128–145)

## 2010-02-09 LAB — CBC WITH DIFFERENTIAL (CANCER CENTER ONLY)
BASO%: 0.3 % (ref 0.0–2.0)
EOS%: 3.2 % (ref 0.0–7.0)
HCT: 35.4 % — ABNORMAL LOW (ref 38.7–49.9)
HGB: 12.1 g/dL — ABNORMAL LOW (ref 13.0–17.1)
MCH: 30.8 pg (ref 28.0–33.4)
MONO#: 0.5 10*3/uL (ref 0.1–0.9)
MONO%: 7.6 % (ref 0.0–13.0)
WBC: 6.2 10*3/uL (ref 4.0–10.0)

## 2010-02-16 ENCOUNTER — Ambulatory Visit: Payer: Self-pay | Admitting: Cardiology

## 2010-02-28 ENCOUNTER — Ambulatory Visit: Payer: Self-pay | Admitting: Oncology

## 2010-03-02 LAB — CBC WITH DIFFERENTIAL (CANCER CENTER ONLY)
BASO%: 0.5 % (ref 0.0–2.0)
EOS%: 3 % (ref 0.0–7.0)
Eosinophils Absolute: 0.2 10*3/uL (ref 0.0–0.5)
HCT: 33.5 % — ABNORMAL LOW (ref 38.7–49.9)
LYMPH#: 0.9 10*3/uL (ref 0.9–3.3)
LYMPH%: 11.3 % — ABNORMAL LOW (ref 14.0–48.0)
MCH: 31.4 pg (ref 28.0–33.4)
MCHC: 34.8 g/dL (ref 32.0–35.9)
MONO%: 8 % (ref 0.0–13.0)
NEUT%: 77.2 % (ref 40.0–80.0)
RBC: 3.71 10*6/uL — ABNORMAL LOW (ref 4.20–5.70)
RDW: 13.8 % (ref 10.5–14.6)
WBC: 7.5 10*3/uL (ref 4.0–10.0)

## 2010-03-09 ENCOUNTER — Ambulatory Visit: Payer: Self-pay | Admitting: Cardiology

## 2010-03-23 LAB — CBC WITH DIFFERENTIAL/PLATELET
BASO%: 0.4 % (ref 0.0–2.0)
LYMPH%: 9.8 % — ABNORMAL LOW (ref 14.0–49.0)
MCHC: 33 g/dL (ref 32.0–36.0)
MONO#: 0.5 10*3/uL (ref 0.1–0.9)
MONO%: 6.9 % (ref 0.0–14.0)
Platelets: 179 10*3/uL (ref 140–400)
RBC: 4.01 10*6/uL — ABNORMAL LOW (ref 4.20–5.82)
RDW: 15.7 % — ABNORMAL HIGH (ref 11.0–14.6)
WBC: 7.5 10*3/uL (ref 4.0–10.3)

## 2010-04-12 ENCOUNTER — Ambulatory Visit: Payer: Self-pay | Admitting: Oncology

## 2010-04-14 ENCOUNTER — Ambulatory Visit: Payer: Self-pay | Admitting: Cardiology

## 2010-04-14 LAB — CBC WITH DIFFERENTIAL/PLATELET
Basophils Absolute: 0 10*3/uL (ref 0.0–0.1)
EOS%: 2.1 % (ref 0.0–7.0)
Eosinophils Absolute: 0.2 10*3/uL (ref 0.0–0.5)
HCT: 35.8 % — ABNORMAL LOW (ref 38.4–49.9)
LYMPH%: 11.1 % — ABNORMAL LOW (ref 14.0–49.0)
MCHC: 33.2 g/dL (ref 32.0–36.0)
MONO#: 0.6 10*3/uL (ref 0.1–0.9)
MONO%: 5.8 % (ref 0.0–14.0)
NEUT%: 80.6 % — ABNORMAL HIGH (ref 39.0–75.0)
RBC: 3.85 10*6/uL — ABNORMAL LOW (ref 4.20–5.82)
WBC: 9.5 10*3/uL (ref 4.0–10.3)
lymph#: 1.1 10*3/uL (ref 0.9–3.3)

## 2010-04-22 ENCOUNTER — Encounter (INDEPENDENT_AMBULATORY_CARE_PROVIDER_SITE_OTHER): Payer: Self-pay | Admitting: *Deleted

## 2010-05-04 LAB — CBC WITH DIFFERENTIAL/PLATELET
BASO%: 0.5 % (ref 0.0–2.0)
Basophils Absolute: 0 10*3/uL (ref 0.0–0.1)
EOS%: 2.3 % (ref 0.0–7.0)
MCH: 30.8 pg (ref 27.2–33.4)
MCHC: 33.3 g/dL (ref 32.0–36.0)
MCV: 92.5 fL (ref 79.3–98.0)
MONO%: 7.1 % (ref 0.0–14.0)
RBC: 3.86 10*6/uL — ABNORMAL LOW (ref 4.20–5.82)
RDW: 14.9 % — ABNORMAL HIGH (ref 11.0–14.6)

## 2010-05-08 ENCOUNTER — Ambulatory Visit: Payer: Self-pay | Admitting: Cardiology

## 2010-05-09 ENCOUNTER — Ambulatory Visit: Payer: Self-pay | Admitting: Cardiology

## 2010-05-21 HISTORY — PX: OTHER SURGICAL HISTORY: SHX169

## 2010-05-25 ENCOUNTER — Ambulatory Visit: Payer: Self-pay | Admitting: Oncology

## 2010-05-25 LAB — CBC WITH DIFFERENTIAL/PLATELET
BASO%: 0.5 % (ref 0.0–2.0)
Basophils Absolute: 0.1 10*3/uL (ref 0.0–0.1)
EOS%: 2.4 % (ref 0.0–7.0)
Eosinophils Absolute: 0.2 10*3/uL (ref 0.0–0.5)
HCT: 36.8 % — ABNORMAL LOW (ref 38.4–49.9)
HGB: 12.2 g/dL — ABNORMAL LOW (ref 13.0–17.1)
LYMPH%: 12 % — ABNORMAL LOW (ref 14.0–49.0)
MCH: 30.1 pg (ref 27.2–33.4)
MCHC: 33.2 g/dL (ref 32.0–36.0)
MCV: 90.9 fL (ref 79.3–98.0)
MONO#: 0.9 10*3/uL (ref 0.1–0.9)
MONO%: 9.2 % (ref 0.0–14.0)
NEUT#: 7.1 10*3/uL — ABNORMAL HIGH (ref 1.5–6.5)
NEUT%: 75.9 % — ABNORMAL HIGH (ref 39.0–75.0)
Platelets: 180 10*3/uL (ref 140–400)
RBC: 4.05 10*6/uL — ABNORMAL LOW (ref 4.20–5.82)
RDW: 14.6 % (ref 11.0–14.6)
WBC: 9.3 10*3/uL (ref 4.0–10.3)
lymph#: 1.1 10*3/uL (ref 0.9–3.3)
nRBC: 0 % (ref 0–0)

## 2010-06-09 ENCOUNTER — Ambulatory Visit: Payer: Self-pay | Admitting: Cardiology

## 2010-06-09 ENCOUNTER — Other Ambulatory Visit (HOSPITAL_COMMUNITY): Payer: Self-pay | Admitting: Oncology

## 2010-06-09 DIAGNOSIS — C61 Malignant neoplasm of prostate: Secondary | ICD-10-CM

## 2010-06-10 ENCOUNTER — Other Ambulatory Visit: Payer: Self-pay | Admitting: Oncology

## 2010-06-10 ENCOUNTER — Other Ambulatory Visit (HOSPITAL_COMMUNITY): Payer: Self-pay | Admitting: Oncology

## 2010-06-10 DIAGNOSIS — C61 Malignant neoplasm of prostate: Secondary | ICD-10-CM

## 2010-06-15 LAB — CBC WITH DIFFERENTIAL/PLATELET
BASO%: 0.6 % (ref 0.0–2.0)
EOS%: 2.3 % (ref 0.0–7.0)
Eosinophils Absolute: 0.2 10*3/uL (ref 0.0–0.5)
HCT: 36.4 % — ABNORMAL LOW (ref 38.4–49.9)
LYMPH%: 12.7 % — ABNORMAL LOW (ref 14.0–49.0)
MCH: 30.3 pg (ref 27.2–33.4)
MCHC: 33.5 g/dL (ref 32.0–36.0)
MONO#: 0.7 10*3/uL (ref 0.1–0.9)
NEUT#: 8.3 10*3/uL — ABNORMAL HIGH (ref 1.5–6.5)
NEUT%: 77.7 % — ABNORMAL HIGH (ref 39.0–75.0)
RBC: 4.03 10*6/uL — ABNORMAL LOW (ref 4.20–5.82)
RDW: 15 % — ABNORMAL HIGH (ref 11.0–14.6)
WBC: 10.6 10*3/uL — ABNORMAL HIGH (ref 4.0–10.3)
lymph#: 1.4 10*3/uL (ref 0.9–3.3)

## 2010-06-20 ENCOUNTER — Ambulatory Visit: Payer: Self-pay | Admitting: Cardiology

## 2010-06-20 NOTE — Miscellaneous (Signed)
Summary: Device preload  Clinical Lists Changes  Observations: Added new observation of PPM INDICATN: Sick sinus syndrome (04/22/2010 12:50) Added new observation of MAGNET RTE: BOL 85 ERI 65 (04/22/2010 12:50) Added new observation of PPMLEADSTAT2: active (04/22/2010 12:50) Added new observation of PPMLEADSER2: [id number] (04/22/2010 12:50) Added new observation of PPMLEADMOD2: 1388T (04/22/2010 12:50) Added new observation of PPMLEADDOI2: 03/27/1196 (04/22/2010 12:50) Added new observation of PPMLEADLOC2: RV (04/22/2010 12:50) Added new observation of PPMLEADSTAT1: active (04/22/2010 12:50) Added new observation of PPMLEADSER1: [id number] (04/22/2010 12:50) Added new observation of PPMLEADMOD1: 1388TC (04/22/2010 12:50) Added new observation of PPMLEADDOI1: 03/27/1996 (04/22/2010 12:50) Added new observation of PPMLEADLOC1: RA (04/22/2010 12:50) Added new observation of PPM DOI: 03/02/2005 (04/22/2010 12:50) Added new observation of PPM SERL#: [id number] (04/22/2010 12:50) Added new observation of PPM MODL#: [id number] (04/22/2010 12:50) Added new observation of PACEMAKERMFG: Medtronic (04/22/2010 12:50) Added new observation of PPM IMP MD: Charlynn Court (04/22/2010 12:50) Added new observation of PPM REFER MD: Rolla Plate (04/22/2010 12:50) Added new observation of PACEMAKER MD: Hillis Range, MD (04/22/2010 12:50)      PPM Specifications Following MD:  Hillis Range, MD     Referring MD:  Rolla Plate PPM Vendor:  Medtronic     PPM Model Number:  ADDR01     PPM Serial Number:  [id number] PPM DOI:  03/02/2005     PPM Implanting MD:  Charlynn Court  Lead 1    Location: RA     DOI: 03/27/1996     Model #: [id number]     Serial #: [id number]     Status: active Lead 2    Location: RV     DOI: 03/27/1196     Model #: 1388T     Serial #: [id number]     Status: active  Magnet Response Rate:  BOL 85 ERI 65  Indications:  Sick sinus syndrome

## 2010-07-04 ENCOUNTER — Ambulatory Visit (INDEPENDENT_AMBULATORY_CARE_PROVIDER_SITE_OTHER): Payer: Medicare Other | Admitting: Cardiology

## 2010-07-04 DIAGNOSIS — Z79899 Other long term (current) drug therapy: Secondary | ICD-10-CM

## 2010-07-04 DIAGNOSIS — I4891 Unspecified atrial fibrillation: Secondary | ICD-10-CM

## 2010-07-04 DIAGNOSIS — E119 Type 2 diabetes mellitus without complications: Secondary | ICD-10-CM

## 2010-07-06 ENCOUNTER — Other Ambulatory Visit: Payer: Self-pay | Admitting: Oncology

## 2010-07-06 ENCOUNTER — Encounter (HOSPITAL_BASED_OUTPATIENT_CLINIC_OR_DEPARTMENT_OTHER): Payer: Medicare Other | Admitting: Oncology

## 2010-07-06 ENCOUNTER — Ambulatory Visit: Payer: Self-pay | Admitting: Cardiology

## 2010-07-06 DIAGNOSIS — C189 Malignant neoplasm of colon, unspecified: Secondary | ICD-10-CM

## 2010-07-06 DIAGNOSIS — N289 Disorder of kidney and ureter, unspecified: Secondary | ICD-10-CM

## 2010-07-06 DIAGNOSIS — C61 Malignant neoplasm of prostate: Secondary | ICD-10-CM

## 2010-07-06 DIAGNOSIS — D649 Anemia, unspecified: Secondary | ICD-10-CM

## 2010-07-06 DIAGNOSIS — D638 Anemia in other chronic diseases classified elsewhere: Secondary | ICD-10-CM

## 2010-07-06 LAB — COMPREHENSIVE METABOLIC PANEL
AST: 15 U/L (ref 0–37)
Alkaline Phosphatase: 91 U/L (ref 39–117)
Calcium: 8.6 mg/dL (ref 8.4–10.5)
Potassium: 4 mEq/L (ref 3.5–5.3)
Sodium: 136 mEq/L (ref 135–145)
Total Protein: 6.5 g/dL (ref 6.0–8.3)

## 2010-07-06 LAB — CBC WITH DIFFERENTIAL/PLATELET
BASO%: 0.3 % (ref 0.0–2.0)
EOS%: 2.4 % (ref 0.0–7.0)
HGB: 10.8 g/dL — ABNORMAL LOW (ref 13.0–17.1)
LYMPH%: 7.3 % — ABNORMAL LOW (ref 14.0–49.0)
MONO#: 0.5 10*3/uL (ref 0.1–0.9)
MONO%: 5.8 % (ref 0.0–14.0)
NEUT%: 84.2 % — ABNORMAL HIGH (ref 39.0–75.0)
Platelets: 176 10*3/uL (ref 140–400)
RDW: 15.7 % — ABNORMAL HIGH (ref 11.0–14.6)

## 2010-07-06 LAB — FERRITIN: Ferritin: 66 ng/mL (ref 22–322)

## 2010-07-06 LAB — IRON AND TIBC
Iron: 55 ug/dL (ref 42–165)
TIBC: 291 ug/dL (ref 215–435)
UIBC: 236 ug/dL

## 2010-07-18 ENCOUNTER — Encounter (HOSPITAL_BASED_OUTPATIENT_CLINIC_OR_DEPARTMENT_OTHER): Payer: Medicare Other | Admitting: Oncology

## 2010-07-18 DIAGNOSIS — N289 Disorder of kidney and ureter, unspecified: Secondary | ICD-10-CM

## 2010-07-18 DIAGNOSIS — D638 Anemia in other chronic diseases classified elsewhere: Secondary | ICD-10-CM

## 2010-07-26 DIAGNOSIS — I498 Other specified cardiac arrhythmias: Secondary | ICD-10-CM | POA: Insufficient documentation

## 2010-07-26 DIAGNOSIS — R55 Syncope and collapse: Secondary | ICD-10-CM | POA: Insufficient documentation

## 2010-07-26 DIAGNOSIS — I219 Acute myocardial infarction, unspecified: Secondary | ICD-10-CM | POA: Insufficient documentation

## 2010-07-26 DIAGNOSIS — E785 Hyperlipidemia, unspecified: Secondary | ICD-10-CM | POA: Insufficient documentation

## 2010-07-26 DIAGNOSIS — E119 Type 2 diabetes mellitus without complications: Secondary | ICD-10-CM | POA: Insufficient documentation

## 2010-07-26 DIAGNOSIS — N259 Disorder resulting from impaired renal tubular function, unspecified: Secondary | ICD-10-CM | POA: Insufficient documentation

## 2010-07-26 DIAGNOSIS — I495 Sick sinus syndrome: Secondary | ICD-10-CM | POA: Insufficient documentation

## 2010-07-26 DIAGNOSIS — D126 Benign neoplasm of colon, unspecified: Secondary | ICD-10-CM | POA: Insufficient documentation

## 2010-07-26 DIAGNOSIS — I10 Essential (primary) hypertension: Secondary | ICD-10-CM | POA: Insufficient documentation

## 2010-07-26 DIAGNOSIS — I2510 Atherosclerotic heart disease of native coronary artery without angina pectoris: Secondary | ICD-10-CM | POA: Insufficient documentation

## 2010-07-26 DIAGNOSIS — C61 Malignant neoplasm of prostate: Secondary | ICD-10-CM | POA: Insufficient documentation

## 2010-07-26 DIAGNOSIS — I4891 Unspecified atrial fibrillation: Secondary | ICD-10-CM | POA: Insufficient documentation

## 2010-07-27 ENCOUNTER — Encounter (HOSPITAL_BASED_OUTPATIENT_CLINIC_OR_DEPARTMENT_OTHER): Payer: Medicare Other | Admitting: Oncology

## 2010-07-27 ENCOUNTER — Other Ambulatory Visit: Payer: Self-pay | Admitting: Oncology

## 2010-07-27 ENCOUNTER — Encounter: Payer: Self-pay | Admitting: Internal Medicine

## 2010-07-27 ENCOUNTER — Encounter (INDEPENDENT_AMBULATORY_CARE_PROVIDER_SITE_OTHER): Payer: Medicare Other | Admitting: Internal Medicine

## 2010-07-27 DIAGNOSIS — I10 Essential (primary) hypertension: Secondary | ICD-10-CM

## 2010-07-27 DIAGNOSIS — I4891 Unspecified atrial fibrillation: Secondary | ICD-10-CM

## 2010-07-27 DIAGNOSIS — C61 Malignant neoplasm of prostate: Secondary | ICD-10-CM

## 2010-07-27 DIAGNOSIS — I495 Sick sinus syndrome: Secondary | ICD-10-CM

## 2010-07-27 LAB — CBC WITH DIFFERENTIAL/PLATELET
Basophils Absolute: 0 10*3/uL (ref 0.0–0.1)
EOS%: 2.1 % (ref 0.0–7.0)
HCT: 34.1 % — ABNORMAL LOW (ref 38.4–49.9)
HGB: 11.6 g/dL — ABNORMAL LOW (ref 13.0–17.1)
LYMPH%: 4.1 % — ABNORMAL LOW (ref 14.0–49.0)
MCH: 31 pg (ref 27.2–33.4)
MCHC: 34.2 g/dL (ref 32.0–36.0)
MONO#: 0.5 10*3/uL (ref 0.1–0.9)
NEUT%: 88.1 % — ABNORMAL HIGH (ref 39.0–75.0)
Platelets: 173 10*3/uL (ref 140–400)
lymph#: 0.3 10*3/uL — ABNORMAL LOW (ref 0.9–3.3)

## 2010-08-01 ENCOUNTER — Encounter (INDEPENDENT_AMBULATORY_CARE_PROVIDER_SITE_OTHER): Payer: Medicare Other

## 2010-08-01 DIAGNOSIS — I4891 Unspecified atrial fibrillation: Secondary | ICD-10-CM

## 2010-08-01 DIAGNOSIS — Z7901 Long term (current) use of anticoagulants: Secondary | ICD-10-CM

## 2010-08-01 NOTE — Assessment & Plan Note (Signed)
Summary: pacer check/Medtronic/[place] Card pt/al   Visit Type:  Initial Consult Referring Provider:  Dr Patty Sermons   History of Present Illness: Jeffery Gibson is a pleasant 77 yo WM with a h/o tachycardia/ bradycardia syndrome s/p PPM who presents today to establish care in the EP device clinic.  He has had a pacemaker for syncope and SSS 03/27/1996.  He reached ERI and underwent pulse generator replacement (MDT) by Dr Reyes Ivan 03/02/05.  He reports doing very well since that time.  He remains active despite his age.  He denies palpitations, chest pain, shortness of breath, orthopnea, PND, lower extremity edema, dizziness, presyncope, syncope, or neurologic sequela. The patient is tolerating medications without difficulties and is otherwise without complaint today.   Current Medications (verified): 1)  Micardis 80 Mg Tabs (Telmisartan) .Marland Kitchen.. 1 By Mouth Daily 2)  Glimepiride 4 Mg Tabs (Glimepiride) .Marland Kitchen.. 1 By Mouth Daily 3)  Amiodarone Hcl 200 Mg Tabs (Amiodarone Hcl) .... 1/2 By Mouth Daily 4)  Hydrochlorothiazide 25 Mg Tabs (Hydrochlorothiazide) .Marland Kitchen.. 1 By Mouth Daily 5)  Coumadin 5 Mg Tabs (Warfarin Sodium) .... Uad 6)  Multivitamins   Tabs (Multiple Vitamin) .Marland Kitchen.. 1 By Mouth Daily 7)  Vitamin C 500 Mg Chew (Ascorbic Acid) .Marland Kitchen.. 1 By Mouth Daily 8)  Vitamin D3 2000 Unit Caps (Cholecalciferol) .Marland Kitchen.. 1 By Mouth Daily 9)  Ursodial 300mg  .... 2 By Mouth Two Times A Day 10)  Lopressor 50 Mg Tabs (Metoprolol Tartrate) .... 2 By Mouth Two Times A Day 11)  Aspirin 81 Mg  Tabs (Aspirin) .Marland Kitchen.. 1 By Mouth Daily 12)  Crestor 10 Mg Tabs (Rosuvastatin Calcium) .... 1/2 By Mouth Daily 13)  Omeprazole 20 Mg Cpdr (Omeprazole) .Marland Kitchen.. 1 By Mouth Daily 14)  Lupron .... Injections Every 4 Months 15)  Aranesp .... Injection Every 3-4 Weeks For Anemia  Allergies (verified): No Known Drug Allergies  Past History:  Past Medical History: ATRIAL FIBRILLATION  BRADYCARDIA-TACHYCARDIA SYNDROME s/p PPM 1997, generator replaced  2006 HYPERTENSION CAD MYOCARDIAL INFARCTION HYPERLIPIDEMIA ADENOMATOUS COLONIC POLYP  PROSTATE CANCER RENAL INSUFFICIENCY  DM   Past Surgical History: Coronary artery bypass grafting x4, using left internal mammary artery to left anterior descending coronary artery; reverse saphenous vein graft to diagonal coronary artery; reverse saphenous vein graft to circumflex coronary artery; reverse saphenous vein graft to distal right coronary artery.  Endo vein harvesting done.  (status post dual-  chamber permanent pacemaker implant) 1997, generator replaced 2006   History of colon cancer status post sigmoid colectomy 2005 at which  time there was no nodal involvement (T3, N0), no chemo given.   Family History: Reviewed history from 07/26/2010 and no changes required.  Positive for COPD and coronary disease.      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  He is married and accompanied by his wife.  Denies TED.  Review of Systems       All systems are reviewed and negative except as listed in the HPI.   Vital Signs:  Patient profile:   77 year old male Height:      68 inches Weight:      257 pounds BMI:     39.22 Pulse rate:   92 / minute Resp:     16 per minute BP sitting:   161 / 83  (right arm)  Vitals Entered By: Marrion Coy, CNA (July 27, 2010 11:28 AM)  Physical Exam  General:  elderly male, NAD Head:  normocephalic and atraumatic Eyes:  PERRLA/EOM intact; conjunctiva and lids normal. Mouth:  Teeth, gums and palate normal. Oral mucosa normal. Neck:  supple Chest Wall:  pacemaker pocket well healed Lungs:  Clear bilaterally to auscultation and percussion. Heart:  RRR, no m/r/g Abdomen:  Bowel sounds positive; abdomen soft and non-tender without masses, organomegaly, or hernias noted. No hepatosplenomegaly. Msk:  Back normal, normal gait. Muscle strength and tone normal. Extremities:  No clubbing or cyanosis. 1+  edema Neurologic:  Alert and oriented x 3. Skin:  Intact without lesions or rashes. Psych:  Normal affect.   PPM Specifications Following MD:  Hillis Range, MD     Referring MD:  Rolla Plate PPM Vendor:  Medtronic     PPM Model Number:  ADDR01     PPM Serial Number:  [id number] PPM DOI:  03/02/2005     PPM Implanting MD:  Charlynn Court  Lead 1    Location: RA     DOI: 03/27/1996     Model #: 1388TC     Serial #: [id number]     Status: active Lead 2    Location: RV     DOI: 03/27/1196     Model #: 1388T     Serial #: [id number]     Status: active  Magnet Response Rate:  BOL 85 ERI 65  Indications:  Sick sinus syndrome  MD Comments:  see scanned report from [facility]  Impression & Recommendations:  Problem # 1:  BRADYCARDIA-TACHYCARDIA SYNDROME (ICD-427.81) normal pacemaker function see scanned report from [facility] no changes today  Problem # 2:  ATRIAL FIBRILLATION (ICD-427.31) stable no changes  Problem # 3:  CAD (ICD-414.00) no ischemic symptoms  Problem # 4:  HYPERTENSION (ICD-401.9) above goal 2 gram sodium diet pt will follow BP at home and contact Dr Patty Sermons if it remains elevated  Patient Instructions: 1)  Your physician wants you to follow-up in: 12 months  You will receive a reminder letter in the mail two months in advance. If you don't receive a letter, please call our office to schedule the follow-up appointment. 2)  Your physician has requested that you limit the intake of sodium (salt) in your diet to two grams daily. Please see [facility] handout.

## 2010-08-02 ENCOUNTER — Other Ambulatory Visit (HOSPITAL_COMMUNITY): Payer: Self-pay

## 2010-08-02 ENCOUNTER — Ambulatory Visit (HOSPITAL_COMMUNITY)
Admission: RE | Admit: 2010-08-02 | Discharge: 2010-08-02 | Disposition: A | Discharge status: Home / Self Care | Payer: Medicare Other | Source: Ambulatory Visit | Attending: Oncology | Admitting: Oncology

## 2010-08-02 ENCOUNTER — Other Ambulatory Visit: Payer: Self-pay | Admitting: Oncology

## 2010-08-02 ENCOUNTER — Encounter (HOSPITAL_COMMUNITY): Payer: Self-pay

## 2010-08-02 ENCOUNTER — Encounter (HOSPITAL_COMMUNITY)
Admission: RE | Admit: 2010-08-02 | Discharge: 2010-08-02 | Disposition: A | Discharge status: Home / Self Care | Payer: Medicare Other | Source: Ambulatory Visit | Attending: Oncology | Admitting: Oncology

## 2010-08-02 DIAGNOSIS — R972 Elevated prostate specific antigen [PSA]: Secondary | ICD-10-CM | POA: Insufficient documentation

## 2010-08-02 DIAGNOSIS — Z85038 Personal history of other malignant neoplasm of large intestine: Secondary | ICD-10-CM | POA: Insufficient documentation

## 2010-08-02 DIAGNOSIS — C61 Malignant neoplasm of prostate: Secondary | ICD-10-CM

## 2010-08-02 DIAGNOSIS — Z9049 Acquired absence of other specified parts of digestive tract: Secondary | ICD-10-CM | POA: Insufficient documentation

## 2010-08-02 DIAGNOSIS — K5730 Diverticulosis of large intestine without perforation or abscess without bleeding: Secondary | ICD-10-CM | POA: Insufficient documentation

## 2010-08-02 HISTORY — DX: Malignant (primary) neoplasm, unspecified: C80.1

## 2010-08-02 MED ORDER — TECHNETIUM TC 99M MEDRONATE IV KIT
24.0000 | PACK | Freq: Once | INTRAVENOUS | Status: AC | PRN
  Administered 2010-08-02: 24 via INTRAVENOUS

## 2010-08-16 ENCOUNTER — Ambulatory Visit (INDEPENDENT_AMBULATORY_CARE_PROVIDER_SITE_OTHER): Payer: Medicare Other | Admitting: *Deleted

## 2010-08-16 DIAGNOSIS — Z7901 Long term (current) use of anticoagulants: Secondary | ICD-10-CM

## 2010-08-16 DIAGNOSIS — I4891 Unspecified atrial fibrillation: Secondary | ICD-10-CM

## 2010-08-17 NOTE — Cardiovascular Report (Signed)
Summary: Office Visit   Office Visit   Imported By: Roderic Ovens 08/11/2010 14:40:36  _____________________________________________________________________  External Attachment:    Type:   Image     Comment:   External Document

## 2010-08-18 ENCOUNTER — Other Ambulatory Visit: Payer: Self-pay | Admitting: Oncology

## 2010-08-18 ENCOUNTER — Encounter (HOSPITAL_BASED_OUTPATIENT_CLINIC_OR_DEPARTMENT_OTHER): Payer: Medicare Other | Admitting: Oncology

## 2010-08-18 DIAGNOSIS — C189 Malignant neoplasm of colon, unspecified: Secondary | ICD-10-CM

## 2010-08-18 DIAGNOSIS — C61 Malignant neoplasm of prostate: Secondary | ICD-10-CM

## 2010-08-18 LAB — CBC WITH DIFFERENTIAL/PLATELET
Basophils Absolute: 0 10*3/uL (ref 0.0–0.1)
EOS%: 0.8 % (ref 0.0–7.0)
Eosinophils Absolute: 0.1 10*3/uL (ref 0.0–0.5)
HCT: 30.8 % — ABNORMAL LOW (ref 38.4–49.9)
HGB: 10.6 g/dL — ABNORMAL LOW (ref 13.0–17.1)
LYMPH%: 5.2 % — ABNORMAL LOW (ref 14.0–49.0)
MCH: 31.4 pg (ref 27.2–33.4)
MCV: 91.3 fL (ref 79.3–98.0)
MONO%: 2.7 % (ref 0.0–14.0)
NEUT#: 10.6 10*3/uL — ABNORMAL HIGH (ref 1.5–6.5)
NEUT%: 91.3 % — ABNORMAL HIGH (ref 39.0–75.0)
Platelets: 146 10*3/uL (ref 140–400)

## 2010-08-22 ENCOUNTER — Telehealth: Payer: Self-pay | Admitting: Cardiology

## 2010-08-22 NOTE — Telephone Encounter (Signed)
Wife wanting to know if ok to use mucinex, advised yes.  Is having some urinary issues and may have to go to emergency room per urologist if no output.  Will get protime tomorrow secondary to finishing up cirpo this morning.

## 2010-08-22 NOTE — Telephone Encounter (Signed)
WHEEZING AND MUCUS/NUMEROUS THINGS CAME  UP IN CONVERSATION SHE NEEDS TO TALK TO MELINDA ABOUT. PLACED CHART IN BOX.

## 2010-08-23 ENCOUNTER — Ambulatory Visit (INDEPENDENT_AMBULATORY_CARE_PROVIDER_SITE_OTHER): Payer: Medicare Other | Admitting: *Deleted

## 2010-08-23 DIAGNOSIS — I4891 Unspecified atrial fibrillation: Secondary | ICD-10-CM

## 2010-08-23 DIAGNOSIS — Z7901 Long term (current) use of anticoagulants: Secondary | ICD-10-CM

## 2010-08-24 ENCOUNTER — Encounter: Payer: Self-pay | Admitting: Cardiology

## 2010-09-04 ENCOUNTER — Encounter: Payer: PRIVATE HEALTH INSURANCE | Admitting: *Deleted

## 2010-09-07 ENCOUNTER — Encounter (HOSPITAL_BASED_OUTPATIENT_CLINIC_OR_DEPARTMENT_OTHER): Payer: Medicare Other | Admitting: Oncology

## 2010-09-07 ENCOUNTER — Other Ambulatory Visit: Payer: Self-pay | Admitting: Oncology

## 2010-09-07 DIAGNOSIS — C61 Malignant neoplasm of prostate: Secondary | ICD-10-CM

## 2010-09-07 LAB — CBC WITH DIFFERENTIAL/PLATELET
EOS%: 4.4 % (ref 0.0–7.0)
Eosinophils Absolute: 0.3 10*3/uL (ref 0.0–0.5)
LYMPH%: 8.9 % — ABNORMAL LOW (ref 14.0–49.0)
MCH: 31.8 pg (ref 27.2–33.4)
MCV: 92.3 fL (ref 79.3–98.0)
MONO%: 6.4 % (ref 0.0–14.0)
NEUT#: 6.3 10*3/uL (ref 1.5–6.5)
Platelets: 168 10*3/uL (ref 140–400)
RBC: 3.6 10*6/uL — ABNORMAL LOW (ref 4.20–5.82)
RDW: 15.4 % — ABNORMAL HIGH (ref 11.0–14.6)

## 2010-09-18 ENCOUNTER — Other Ambulatory Visit: Payer: Self-pay | Admitting: *Deleted

## 2010-09-18 DIAGNOSIS — E119 Type 2 diabetes mellitus without complications: Secondary | ICD-10-CM

## 2010-09-18 DIAGNOSIS — Z79899 Other long term (current) drug therapy: Secondary | ICD-10-CM

## 2010-09-18 DIAGNOSIS — E7800 Pure hypercholesterolemia, unspecified: Secondary | ICD-10-CM

## 2010-09-20 ENCOUNTER — Ambulatory Visit (INDEPENDENT_AMBULATORY_CARE_PROVIDER_SITE_OTHER): Payer: Medicare Other | Admitting: *Deleted

## 2010-09-20 ENCOUNTER — Telehealth: Payer: Self-pay | Admitting: *Deleted

## 2010-09-20 DIAGNOSIS — I4891 Unspecified atrial fibrillation: Secondary | ICD-10-CM

## 2010-09-20 LAB — POCT INR: INR: 1.5

## 2010-09-20 NOTE — Telephone Encounter (Signed)
When called and spoke with wife about coumadin she stated patient had an episode.  Stated just after lunch he said he had a sick feeling, sharp pain in back of neck, and she said his color looked drained.  Stated he was feeling better now.  I asked Mrs. Luellen if he had any change in speech, weakness, or any other things that were unusual for him, she said no.  She also stated he didn't think it was his heart.  Advised for her to try checking his blood sugar if it should happen again.  Again she said he was feeling ok now.  If any more problems says she will call back or covering physician.

## 2010-09-21 NOTE — Telephone Encounter (Signed)
No other suggestions

## 2010-09-25 ENCOUNTER — Other Ambulatory Visit: Payer: Self-pay | Admitting: *Deleted

## 2010-09-25 DIAGNOSIS — R52 Pain, unspecified: Secondary | ICD-10-CM

## 2010-09-25 MED ORDER — IBUPROFEN 600 MG PO TABS: ORAL_TABLET | ORAL | Status: DC

## 2010-09-25 MED ORDER — NITROGLYCERIN 0.4 MG SL SUBL: 0.4000 mg | SUBLINGUAL_TABLET | SUBLINGUAL | Status: DC | PRN

## 2010-09-28 ENCOUNTER — Encounter (HOSPITAL_BASED_OUTPATIENT_CLINIC_OR_DEPARTMENT_OTHER): Payer: Medicare Other | Admitting: Oncology

## 2010-09-28 ENCOUNTER — Other Ambulatory Visit: Payer: Self-pay | Admitting: Oncology

## 2010-09-28 ENCOUNTER — Encounter: Payer: Self-pay | Admitting: *Deleted

## 2010-09-28 ENCOUNTER — Encounter: Payer: Self-pay | Admitting: Cardiology

## 2010-09-28 DIAGNOSIS — C61 Malignant neoplasm of prostate: Secondary | ICD-10-CM

## 2010-09-28 LAB — CBC WITH DIFFERENTIAL/PLATELET
EOS%: 2.2 % (ref 0.0–7.0)
Eosinophils Absolute: 0.2 10*3/uL (ref 0.0–0.5)
LYMPH%: 11.7 % — ABNORMAL LOW (ref 14.0–49.0)
MCH: 30.1 pg (ref 27.2–33.4)
MCHC: 33.1 g/dL (ref 32.0–36.0)
MCV: 90.7 fL (ref 79.3–98.0)
MONO%: 5.4 % (ref 0.0–14.0)
NEUT#: 7.8 10*3/uL — ABNORMAL HIGH (ref 1.5–6.5)
Platelets: 165 10*3/uL (ref 140–400)
RBC: 3.89 10*6/uL — ABNORMAL LOW (ref 4.20–5.82)

## 2010-10-02 ENCOUNTER — Other Ambulatory Visit (INDEPENDENT_AMBULATORY_CARE_PROVIDER_SITE_OTHER): Payer: Medicare Other | Admitting: *Deleted

## 2010-10-02 ENCOUNTER — Ambulatory Visit (INDEPENDENT_AMBULATORY_CARE_PROVIDER_SITE_OTHER): Payer: Medicare Other | Admitting: *Deleted

## 2010-10-02 ENCOUNTER — Encounter: Payer: Self-pay | Admitting: Cardiology

## 2010-10-02 ENCOUNTER — Ambulatory Visit (INDEPENDENT_AMBULATORY_CARE_PROVIDER_SITE_OTHER): Payer: Medicare Other | Admitting: Cardiology

## 2010-10-02 VITALS — BP 124/74 | HR 74 | Wt 257.0 lb

## 2010-10-02 DIAGNOSIS — Z79899 Other long term (current) drug therapy: Secondary | ICD-10-CM

## 2010-10-02 DIAGNOSIS — I10 Essential (primary) hypertension: Secondary | ICD-10-CM

## 2010-10-02 DIAGNOSIS — R079 Chest pain, unspecified: Secondary | ICD-10-CM

## 2010-10-02 DIAGNOSIS — C61 Malignant neoplasm of prostate: Secondary | ICD-10-CM

## 2010-10-02 DIAGNOSIS — I4891 Unspecified atrial fibrillation: Secondary | ICD-10-CM

## 2010-10-02 DIAGNOSIS — E7800 Pure hypercholesterolemia, unspecified: Secondary | ICD-10-CM

## 2010-10-02 DIAGNOSIS — I2510 Atherosclerotic heart disease of native coronary artery without angina pectoris: Secondary | ICD-10-CM

## 2010-10-02 DIAGNOSIS — E119 Type 2 diabetes mellitus without complications: Secondary | ICD-10-CM

## 2010-10-02 LAB — BASIC METABOLIC PANEL
Calcium: 9 mg/dL (ref 8.4–10.5)
GFR: 31.94 mL/min — ABNORMAL LOW (ref >60.00)
Glucose, Bld: 149 mg/dL — ABNORMAL HIGH (ref 70–99)
Sodium: 134 mEq/L — ABNORMAL LOW (ref 135–145)

## 2010-10-02 LAB — CBC WITH DIFFERENTIAL/PLATELET
Basophils Relative: 0.2 % (ref 0.0–3.0)
Eosinophils Relative: 1.9 % (ref 0.0–5.0)
Lymphocytes Relative: 10 % — ABNORMAL LOW (ref 12.0–46.0)
Lymphs Abs: 0.8 10*3/uL (ref 0.7–4.0)
MCV: 93.7 fl (ref 78.0–100.0)
Monocytes Relative: 7.7 % (ref 3.0–12.0)
Neutro Abs: 6.6 10*3/uL (ref 1.4–7.7)
RDW: 14.9 % — ABNORMAL HIGH (ref 11.5–14.6)
WBC: 8.3 10*3/uL (ref 4.5–10.5)

## 2010-10-02 LAB — POCT INR: INR: 1.8

## 2010-10-02 LAB — LIPID PANEL
Cholesterol: 134 mg/dL (ref 0–200)
HDL: 44.4 mg/dL (ref >39.00)
VLDL: 26.4 mg/dL (ref 0.0–40.0)

## 2010-10-02 LAB — TSH: TSH: 0.44 u[IU]/mL (ref 0.35–5.50)

## 2010-10-02 LAB — HEPATIC FUNCTION PANEL
Albumin: 3.5 g/dL (ref 3.5–5.2)
Alkaline Phosphatase: 83 U/L (ref 39–117)
Total Bilirubin: 0.5 mg/dL (ref 0.3–1.2)

## 2010-10-02 MED ORDER — NITROGLYCERIN 0.4 MG SL SUBL: 0.4000 mg | SUBLINGUAL_TABLET | SUBLINGUAL | Status: DC | PRN

## 2010-10-02 NOTE — Assessment & Plan Note (Signed)
The patient brought in a recent results of his PSA which was 4.64 which his urologist feels is satisfactory and requires no specific treatment at this time.

## 2010-10-02 NOTE — Assessment & Plan Note (Signed)
The patient has a past history of tachybradycardia syndrome and he has a functioning dual-chamber pacemaker.  He has not been aware of a recent episodes of atrial fibrillation.  He remains on long-term Coumadin and his INR today is subtherapeutic at 1.8 and his dose has been adjusted upward At the Coumadin clinic

## 2010-10-02 NOTE — Assessment & Plan Note (Signed)
The patient has a history of essential hypertension.  He has not been expressing any headaches.  He has had occasional weak spells which tend to occur shortly after eating a large meal.  The timing is not such that would suggest hypoglycemia

## 2010-10-02 NOTE — Assessment & Plan Note (Signed)
The patient has not been expressing any angina pectoris. 

## 2010-10-02 NOTE — Progress Notes (Signed)
Jeffery Gibson Date of Birth:  1928/08/03 [facility] [address]   [address] [place], [place]  [address] [phone]           Fax   [phone]  History of Present Illness: This pleasant 77 year old gentleman is seen for a scheduled followup office visit.  He has a complex past medical history.  He has a history of prostate cancer followed at [facility].  He's had anemia of chronic disease.  He's had a history of paroxysmal atrial fibrillation and tachybradycardia syndrome and has had a Medtronic pacemaker dual chamber implanted in 2007.  He has known coronary artery disease and is status post CABG.  CABG was 05/06/06.  He's had a problem with weight gain.  He is a diabetic.He has not been having any hypoglycemic episodes.  He said a history of essential hypertension.  His last echocardiogram was 11/02/05 and showed mild aortic sclerosis mild mitral regurgitation and moderate LVH with normal systolic function.  His ejection fraction was 55-60%.  He has had a history of colon cancer as well as prostate cancer.  Current Outpatient Prescriptions  Medication Sig Dispense Refill  . amiodarone (PACERONE) 100 MG tablet Take 200 mg by mouth daily. 1/2 tablet       . Ascorbic Acid (VITAMIN C PO) Take 1 tablet by mouth daily.        Marland Kitchen aspirin 81 MG tablet Take 81 mg by mouth daily.        . Cholecalciferol (VITAMIN D) 2000 UNITS tablet Take 2,000 Units by mouth daily.        Marland Kitchen FINASTERIDE PO Take 5 mg by mouth daily.        Marland Kitchen glimepiride (AMARYL) 4 MG tablet Take 4 mg by mouth daily before breakfast. 1/4 of tablet prn       . hydrochlorothiazide 25 MG tablet Take 25 mg by mouth daily. Taking 1/2 daily      . ibuprofen (ADVIL,MOTRIN) 600 MG tablet 1 daily with food as needed  90 tablet  0  . metoprolol (TOPROL-XL) 50 MG 24 hr tablet Take 50 mg by mouth 2 (two) times daily.        . Multiple Vitamin (MULTIVITAMIN) tablet Take 1 tablet by mouth daily.        .  nitroGLYCERIN (NITROSTAT) 0.4 MG SL tablet Place 1 tablet (0.4 mg total) under the tongue every 5 (five) minutes as needed for chest pain.  100 tablet  3  . omeprazole (PRILOSEC) 20 MG capsule Take 20 mg by mouth at bedtime.        . rosuvastatin (CRESTOR) 10 MG tablet Take 10 mg by mouth at bedtime. 1/2 tablet         . telmisartan (MICARDIS) 80 MG tablet Take 80 mg by mouth daily.        . ursodiol (ACTIGALL) 300 MG capsule Take 300 mg by mouth 2 (two) times daily.        Marland Kitchen warfarin (COUMADIN) 5 MG tablet Take 5 mg by mouth. Take as directed by the coumadin clinic       . DISCONTD: nitroGLYCERIN (NITROSTAT) 0.4 MG SL tablet Place 1 tablet (0.4 mg total) under the tongue every 5 (five) minutes as needed for chest pain.  100 tablet  3    Allergies  Allergen Reactions  . Altace Cough  . Bextra (Valdecoxib) Diarrhea  . Latex   . Metformin And Related Nausea Only  . Mevacor (Lovastatin)  Nausea And Vomiting    Patient Active Problem List  Diagnoses  . PROSTATE CANCER  . ADENOMATOUS COLONIC POLYP  . DM  . HYPERLIPIDEMIA  . HYPERTENSION  . MYOCARDIAL INFARCTION  . CAD  . Atrial fibrillation  . BRADYCARDIA-TACHYCARDIA SYNDROME  . SUPRAVENTRICULAR TACHYCARDIA  . RENAL INSUFFICIENCY  . SYNCOPE    History  Smoking status  . Former Smoker  . Types: Cigarettes  . Quit date: 05/22/1963  Smokeless tobacco  . Not on file    History  Alcohol Use No    Family History  Problem Relation Age of Onset  . Heart failure Father 9  . COPD    . Coronary artery disease    . Gallbladder disease Father   . Emphysema      Review of Systems: Constitutional: no fever chills diaphoresis or fatigue or change in weight.  Head and neck: no hearing loss, no epistaxis, no photophobia or visual disturbance. Respiratory: No cough, shortness of breath or wheezing. Cardiovascular: No chest pain peripheral edema, palpitations. Gastrointestinal: No abdominal distention, no abdominal pain, no  change in bowel habits hematochezia or melena. Genitourinary: No dysuria, no frequency, no urgency, no nocturia. Musculoskeletal:No arthralgias, no back pain, no gait disturbance or myalgias. Neurological: No dizziness, no headaches, no numbness, no seizures, no syncope, no weakness, no tremors. Hematologic: No lymphadenopathy, no easy bruising. Psychiatric: No confusion, no hallucinations, no sleep disturbance.    Physical Exam: Filed Vitals:   10/02/10 0926  BP: 124/74  Pulse: 74  The general appearance reveals a overweight elderly gentleman in no acute distress.Pupils equal and reactive.   Extraocular Movements are full.  There is no scleral icterus.  The mouth and pharynx are normal.  The neck is supple.  The carotids reveal no bruits.  The jugular venous pressure is normal.  The thyroid is not enlarged.  There is no lymphadenopathy.The chest is clear to percussion and auscultation. There are no rales or rhonchi. Expansion of the chest is symmetrical.The precordium is quiet.  The first heart sound is normal.  The second heart sound is physiologically split.  There is no murmur gallop rub or click.  There is no abnormal lift or heave. The rhythm is regular today.The abdomen is soft and nontender. Bowel sounds are normal. The liver and spleen are not enlarged. There Are no abdominal masses. There are no bruits.The pedal pulses are good.  There is no phlebitis or edema.  There is no cyanosis or clubbing.Strength is normal and symmetrical in all extremities.  There is no lateralizing weakness.  There are no sensory deficits.The skin is warm and dry.  There is no rash.There are no overt psychiatric signs or symptoms.   Assessment / Plan: Continue same medication.  Recheck in 3 months for followup office visit and fasting lab work.

## 2010-10-03 NOTE — H&P (Signed)
NAME:  Jeffery Gibson, Jeffery Gibson NO.:  [id number]   MEDICAL RECORD NO.:  [id number]          PATIENT TYPE:  OBV   LOCATION:  2037                         FACILITY:  [facility]   PHYSICIAN:  Elmore Guise., M.D.DATE OF BIRTH:  [birth year]   DATE OF ADMISSION:  03/02/2008  DATE OF DISCHARGE:                              HISTORY & PHYSICAL   INDICATION FOR ADMISSION:  Presyncope.   PRIMARY CARDIOLOGIST:  Dr. Ronny Flurry.   HISTORY OF PRESENT ILLNESS:  Jeffery Gibson is a very pleasant 77-year-  old white male with past medical history of diabetes mellitus,  paroxysmal atrial fibrillation (on chronic Coumadin), coronary artery  disease, hypertension, history of brady tachy syndrome (status post dual-  chamber permanent pacemaker implant), who presents after a presyncopal  episode.  The patient reports normal state of health.  He was out this  morning doing yard work when he got very hot, clammy, nauseous and felt  like I was going to pass out.  He went to the steps to sit down, Jeffery  son was at Jeffery side.  He reports Jeffery Gibson was confused, very pale and  diaphoretic.  He called EMS.  EMS on arrival did Jeffery vitals.  Jeffery blood  pressure was 80/40 and Jeffery pulse was thready.  He was given 1 liter of  normal saline with normalization of blood pressure and was brought to  the emergency room for further evaluation.  He now reports I am back to  normal.  He denies any recent problems with vomiting or viral  syndromes.  He does state that he has had occasional nausea since he  started Lupron and Degarelix.  No chest pain or shortness of breath.  No  significant lower extremity edema.  He does report he recently started  on Norvasc.   CURRENT MEDICATIONS:  1. Actos 45 mg daily.  2. Micardis 80 mg daily.  3. Coumadin.  4. Metformin 500 mg twice daily.  5. Amiodarone 100 mg daily.  6. Lopressor 50 mg twice daily.  7. Crestor 5 mg nightly.  8. Omeprazole 20 mg daily.  9. Aspirin  once daily.  10.Ursodial 300 mg twice daily.  11.Hydrochlorothiazide 12.5 mg daily.   ALLERGIES:  1. MEVACOR.  2. BEXTRA.  3. ALTACE.   FAMILY HISTORY:  Positive for COPD and coronary disease.   SOCIAL HISTORY:  He is married.  No tobacco, no alcohol.   PHYSICAL EXAMINATION:  VITAL SIGNS:  He is afebrile.  Blood pressure is  123/60, heart rate 58-90, showing sinus rhythm on the monitor, sating  96% on room air.  GENERAL:  He is a very pleasant elderly white male alert and oriented x4  in no acute distress.  HEENT:  Appears normal.  NECK:  Supple.  No lymphadenopathy.  2+ carotids.  No JVD.  No bruits.  LUNGS:  Clear.  HEART:  Regular with normal S1-S2.  No rub noted.  ABDOMEN:  Soft, nontender, nondistended.  No rebound or guarding.  EXTREMITIES:  Warm with trace pretibial edema.  NEUROLOGICAL:  He has no focal deficits.  Strength is 5/5  and equal  bilaterally.   Jeffery blood work shows an INR of 2.8, potassium of 4.7, BUN and creatinine  of 42 and 1.9, hemoglobin of 10.4 with white blood cell count of 8.9 and  platelet count of 158.  Jeffery CT of the head showed mild atrophy with  possible old tiny lacunar infarct at the right basal ganglia.  No acute  intracranial abnormalities were noted.  Jeffery chest x-ray showed no acute  cardiopulmonary disease.  Jeffery ECG showed sinus bradycardia with a first-  degree AV block.  No significant ST or T-wave changes noted.  Jeffery pacer  was interrogated and functioning appropriately.  He is set to pace at 50  beats per minute.   IMPRESSION:  1. Questionable vagal episode.  2. History of coronary artery disease.  3. History of sick sinus syndrome, status post dual-chamber permanent      pacemaker implant.  4. Chronic renal insufficiency.  5. Diabetes mellitus.  6. History of hypertension.   PLAN:  The patient will be admitted for observation with telemetry  monitoring.  We will continue gentle hydration for now.  He will have  orthostatics  checked.  We will hold Jeffery Norvasc.  Because Jeffery creatinine  is greater than 1.5, I will also hold Jeffery metformin.  If Jeffery symptoms do  continue, likely would consider increasing Jeffery heart rate from 50-60 or  65 beats per minute.  He is currently pacing the atrium less than 1% of  the time.  He will have serial cardiac enzymes performed.  If everything  checks out fine, I anticipate he could be discharged in the morning.  Dr. Patty Sermons to assume care at that point.      Elmore Guise., M.D.  Electronically Signed     TWK/MEDQ  D:  03/02/2008  T:  03/02/2008  Job:  [id number]   cc:   Cassell Clement, M.D.

## 2010-10-03 NOTE — Consult Note (Signed)
NAME:  Jeffery Gibson, Jeffery Gibson NO.:  [id number]   MEDICAL RECORD NO.:  [id number]          PATIENT TYPE:  INP   LOCATION:  3735                         FACILITY:  [facility]   PHYSICIAN:  Lennie Muckle, MD      DATE OF BIRTH:  [birth year]   DATE OF CONSULTATION:  02/21/2007  DATE OF DISCHARGE:                                 CONSULTATION   CONSULTATION:   REASON FOR CONSULTATION:  Evaluate for possible cholecystitis.   HISTORY OF PRESENT ILLNESS:  [name] is a 77 year old male who was  admitted for a fever of 102 yesterday.  He had generalized abdominal  pain on Tuesday which he describes as crampy, sometimes sharp pain which  radiated to his back.  He did have a colonoscopy on Monday and had 10  polyps removed.  He has no associated nausea or vomiting, no diarrhea.  Now his abdominal pain has seemed to have resolved.  He does not give a  history of having any right upper quadrant pain, especially after eating  greasy foods.  No significant history of epigastric discomfort, reflux  or heartburn or weight loss.  He did have a workup which included an  ultrasound on October 2 which revealed some sludge within the  gallbladder and mild gallbladder wall thickening.  No fluid around the  gallbladder.  He had a HIDA scan which was normal with the gallbladder  filling on examination.  He has had some enzymes drawn while he has been  in the hospital with mild elevation in AST and ALT as well as a mild  rise in his bilirubin of 1.5.  His AST is 101 and ALT is 196, alkaline  phosphatase has  been normal at 89 and 98.   PAST MEDICAL HISTORY:  His past medical history is significant for:  1. Diabetes.  2. Coronary artery disease.  3. Prostate cancer.  4. Colon cancer.  5. Atrial fibrillation.   PAST SURGICAL HISTORY:  1. Colon resection, August 2005.  2. Coronary artery bypass and a pacemaker.   REVIEW OF SYSTEMS:  As per the patient's chart and HPI.  No other  significant  positives.   SOCIAL HISTORY:  He is married.   MEDICATIONS/ALLERGIES:  Medications located in the patient's chart as  well as patient's allergies.   PHYSICAL EXAMINATION:  GENERAL:  The patient is laying in bed.  Appears  his stated age.  He is pleasant and in no acute distress.  VITAL SIGNS:  Temperature is 96.  Pulse of 83.  Blood pressure of  167/69.  HEENT EXAM:  Extraocular muscles are intact.  There is no scleral  icterus evident.  ABDOMEN:  Focused examination on his abdomen reveals a soft abdomen and  incisional midline scars, no hernias are appreciated.  He is nontender,  nondistended to deep palpation.  NO masses are palpated.  EXAMINATION OF EXTREMITIES:  Reveals no deformities or edema noted.  SKIN EXAM:  Within normal limits.   ASSESSMENT/PLAN:  Abdominal pain of unknown etiology.  I do not feel he  is having issues with his gallbladder given  his presentation and his  past and current symptoms.  Also his HIDA scan is normal and the  ultrasound was essentially normal as well.  There is only a minimal  amount of gallbladder wall thickening, but none that I would think would  coincide with acute cholecystitis.  I would recommend going ahead and  starting him on a diet.  Monitor for any change in examination.  He does  have a mild elevation in his liver enzymes which could be consistent  with medications.  I do not think there is any need for performing  cholecystectomy at this time and no further need for follow-up with  surgery given no past history of symptoms from his gallbladder.  Feel  free to call if the patient does have a change in symptoms or starts to  have symptomatology which is consistent with gallbladder disease.      Lennie Muckle, MD  Electronically Signed     ALA/MEDQ  D:  02/21/2007  T:  02/21/2007  Job:  [id number]

## 2010-10-03 NOTE — Discharge Summary (Signed)
NAME:  Jeffery Gibson, Jeffery Gibson NO.:  [id number]   MEDICAL RECORD NO.:  [id number]          PATIENT TYPE:  INP   LOCATION:  3735                         FACILITY:  [facility]   PHYSICIAN:  Bernette Redbird, M.D.   DATE OF BIRTH:  06-07-1928   DATE OF ADMISSION:  02/20/2007  DATE OF DISCHARGE:                               DISCHARGE SUMMARY   ADMISSION HISTORY AND PHYSICAL:  This 77 year old gentleman is being  admitted to the hospital because of recent abdominal pain, chills, and  elevated liver chemistries.   [name] developed the abrupt onset of diffuse abdominal pain about  1 a.m. yesterday morning, awakening him out of sleep.  The pain radiated  through to the back.  As the hours went on he developed a low-grade  fever that rose to as high as 102 degrees, even as the pain itself  seemed to diminish.  He was seen by me in the office yesterday afternoon  in absolutely no distress and with a benign abdominal exam and no fever,  but lab work showed elevated liver chemistries and a marginal elevation  of his white count.  Hospitalization was advised but the patient  declined it so I started him empirically on Keflex for what was thought  to possibly be cholangitis and an abdominal ultrasound was obtained this  morning which showed extensive sludge but no shadowing stones, a  somewhat thick-walled gallbladder at about 5 mm, and a normal-caliber  common bile duct at approximately 3 mm.   The patient returned to my office this afternoon for further labs and  reevaluation.  All of his labs were somewhat improved, amylase and  lipase were normal as they had also been the day before, and the patient  had remained free of pain from the preceding day, but he did have at  least two episodes of shaking chills today.  He attributed this to the  cooler whether.   Of note, all of these symptoms began approximately 24 hours after a  routine surveillance colonoscopy by me performed 3  days ago, at which  time approximately eight small polyps were removed by cold snare and  cold biopsy technique (no cautery).  As before, the patient was noted to  have extensive diverticulosis.   In view of the chills and the positive biliary findings on his abdominal  ultrasound, and in view of the fact that the patient is diabetic,  inpatient management was felt to be necessary.   PAST MEDICAL HISTORY:  No known allergies.   Outpatient medications:  1. Coumadin variable dose, typically 5 mg on Wednesday and Sunday and      2.5 mg the other 5 days of the week.  2. Actos 45 mg daily.  3. Omeprazole 20 mg q.h.s.  4. Aspirin 81 mg each morning.  5. Vitamin C 500 mg each morning.  6. Multiple vitamin each morning.  7. Micardis 80 mg each morning.  8. Metformin 500 mg b.i.d.  9. Amiodarone 100 mg each night (half of a 200-mg tablet).  10.Metoprolol 50 mg b.i.d.  11.Crestor 5 mg q.h.s. (half of  a 10-mg tablet).  12.Lasix 20 mg each morning (half of a 40-mg tablet).   Operations:  1. Pacemaker placement 1997.  2. Sigmoid colectomy July 2005 for colon cancer.  3. Radiation seed implants for prostate cancer January 2007.  4. Four vessel CABG December 2007.   Medical illnesses include:  1. Type 2 diabetes diagnosed approximately a year ago.  2. Atrial atrial fibrillation status post cardioversion, on      maintenance with amiodarone and Coumadin.  3. Prostate cancer treated with radiation.  4. Coronary artery disease without history of MI, status post CABG.  5. History of pacemaker placement.  6. History of pseudomembranous colitis May 2007.  7. History of colon cancer status post sigmoid colectomy 2005 at which      time there was no nodal involvement (T3, N0), no chemo given.  8. History of multiple adenomatous polyps.  Incidentally, all of the      polyps recently removed were benign but several were adenomatous in      character.  In addition, the patient has pancolonic  diverticulosis.   HABITS:  Nonsmoker, nondrinker.   FAMILY HISTORY:  Negative for colon cancer, polyps or liver disease, but  probably positive for gallbladder disease in his father who never had  his gallbladder removed due to significant medical comorbidities.   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  He is married and accompanied by his wife.   REVIEW OF SYSTEMS:  Pertinent for shaking chill today.   PHYSICAL EXAMINATION:  The patient appears slightly jittery or shaky  consistent with his chills but is not in any acute distress.  In the  office, his weight with clothing is 203 pounds, blood pressure 142/70,  pulse 68, temperature 98.2.  HEENT:  Anicteric.  No frank pallor.  CHEST:  Clear to auscultation.  HEART:  Regular rhythm, no murmurs or gallops.  ABDOMEN:  Slightly adipose, active bowel sounds.  No tenderness at this  time; no guarding, masses or organomegaly.   LABORATORIES TODAY:  Include white count 8600, hemoglobin 11.0.  Differential shows 87% polys.  Lipase is normal at 18.  Glucose is 162,  BUN 24, creatinine 1.5, electrolytes normal.  Total bilirubin 1.8, alk  phos 98, AST 195, ALT 262.  Reportedly, an INR at Dr. Yevonne Pax office  today was 1.5, although I do not have a hard copy of that report.  In  comparison, labs yesterday included total bilirubin 1.6; AST 502; ALT  343; white count 10,600 with 89% polys; normal amylase and lipase; BUN  27; creatinine 1.6.   Ultrasound:  See above report.  I have discussed it with the radiologist  on the telephone.   IMPRESSION:  I think the overall picture is most consistent with  smoldering cholecystitis and/or cholangitis.  Given the normal-caliber  duct in the absence of any discrete stones being seen, I favor the  absence of common duct pathology and instead would tend to attribute the  elevated liver chemistries to reactive hepatopathy.  In view of the  fact that  this problem is occurring in an older diabetic on Coumadin,  the potential for destabilization is substantial.   PROBLEM LIST:  1. Abdominal pain, recent fevers, chills and elevated liver      chemistries, suggestive of biliary tract disease.  2. Diabetes.  3. Chronic anticoagulation, on Coumadin.  4. History of atrial fibrillation on amiodarone and status post  pacemaker placement.  5. History of colon cancer status post sigmoid colectomy.  6. History of prostate cancer status post radiation seed implants  7. Pancolonic diverticulosis.  8. History of coronary disease status post four-vessel coronary artery      bypass grafting.  9. History of pseudomembranous colitis.   PLAN:  1. Admission to telemetry unit  2. Dr. Patty Sermons will be seeing the patient to assist with management      regarding diabetic and cardiac issues.  3. Surgical consultation.  I have already spoken with the surgery PA      and they will plan to see the patient this evening.  4. For now, we will hold the patient's Coumadin and aspirin and also      some other medicines including his Crestor.  5. IV antibiotics.  6. Probiotic therapy in view of his prior history of C. difficile.  7. N.p.o. with STAT HIDA scan to check the gallbladder as well as      plain abdominal films to help rule out perforation from his recent      colonoscopy (doubt, in view of benign exam and time course of      symptoms).  8. Consider abdominal CT scan if the HIDA scan is normal.           ______________________________  Bernette Redbird, M.D.     RB/MEDQ  D:  02/20/2007  T:  02/21/2007  Job:  [id number]   cc:   Lennie Muckle, MD  Cassell Clement, M.D.

## 2010-10-03 NOTE — Discharge Summary (Signed)
NAME:  AMARO, MANGOLD NO.:  [id number]   MEDICAL RECORD NO.:  [id number]          PATIENT TYPE:  INP   LOCATION:  3735                         FACILITY:  [facility]   PHYSICIAN:  Troye C. Madilyn Fireman, M.D.    DATE OF BIRTH:  [birth year]   DATE OF ADMISSION:  02/20/2007  DATE OF DISCHARGE:  02/22/2007                               DISCHARGE SUMMARY   HISTORY OF ILLNESS:  The patient is 77 year old white male admitted, due  to diffuse abdominal pain, chills and elevated liver chemistries.  All  this occurred within a day after he had had a colonoscopy, with eight  small polyps removed by cold biopsy, with extensive diverticulosis  incidentally noted.  For details, please see admission history and  physical.   COURSE IN HOSPITAL:  The patient was admitted and started on Unison.  Initially, he had had an ultrasound which showed gallbladder sludge and  mild gallbladder wall thickening with a normal common bile duct.  Surgical consultation was obtained, and they obtained a hepatobiliary  scan which was normal.  The patient's pain had already dissolved by the  time he was at hospital and never recurred.  His liver function tests  which were mildly elevated improved each day, while he was in, and  surgery felt a overall there was not enough evidence of cholelithiasis  or cholecystitis to warrant cholecystectomy at the time.  It was noted  that the patient had had fairly serious C. Difficile colitis in the  past, and therefore we switched him from [place] to Cipro and Flagyl, and  at the time of discharge they decided to send him home on p.o. Flagyl  only.  He was tolerating a diet, having normal non-bloody bowel  movements and was basically asymptomatic.  He did have a temperature  spike of 101, the day before he left that was afebrile on the day of  discharge.   DISCHARGE MEDICATIONS:  Same as on admission, plus Flagyl 500 mg b.i.d.  for 7 days.   FOLLOWUPS:  To be with Dr.  Matthias Hughs in 1 to 2 weeks with liver function  tests to be checked at that time.  He will call sooner, if he any  further abdominal pain, would re-evaluate him sooner.   DISCHARGE DIAGNOSIS:  Abdominal pain, etiology unclear.  Elevated liver  function tests, colon polyps, diverticulosis.   CONDITION ON DISCHARGE:  Improved.           ______________________________  Everardo All Madilyn Fireman, M.D.     JCH/MEDQ  D:  02/22/2007  T:  02/23/2007  Job:  [id number]   cc:   Bernette Redbird, M.D.  Cassell Clement, M.D.

## 2010-10-03 NOTE — Consult Note (Signed)
NAME:  Jeffery Gibson, Jeffery Gibson NO.:  [id number]   MEDICAL RECORD NO.:  [id number]          PATIENT TYPE:  INP   LOCATION:  3735                         FACILITY:  [facility]   PHYSICIAN:  Cassell Clement, M.D. DATE OF BIRTH:  1928-08-31   DATE OF CONSULTATION:  02/20/2007  DATE OF DISCHARGE:                                 CONSULTATION   CARDIOLOGY CONSULTATION:   CHIEF COMPLAINT:  Fever and abnormal liver function studies.   HISTORY:  This is a 77 year old married Caucasian diabetic gentleman  admitted from Dr. Buccini's office with fever, transient abdominal pain  and elevated liver function studies.  The patient was in his usual state  of health until earlier this week.  He had an uneventful routine  colonoscopy on Monday September 29.  He did well for the next 24 hours  but then following that developed abdominal pain and probable fever.  He  saw Dr. Matthias Hughs yesterday and was found to have abnormal liver function  studies, and he underwent a gallbladder ultrasound as an outpatient  today, which showed sludge but no definite stones.  He went back to see  Dr. Matthias Hughs this afternoon and was having a shaking chill and together  with the elevated liver function studies, it was felt wise that he come  into the hospital for further evaluation since he may need surgical  intervention.   The patient has a complex past medical history.  He is status post CABG.  His surgery was in December 2007.  Dr. Tyrone Sage was his surgeon.  He  underwent coronary bypass grafting x4 on May 06, 2006, with an  internal mammary artery to the LAD, reverses saphenous vein graft to the  diagonal, reversed saphenous vein graft to the circumflex and reversed  saphenous vein graft to the distal right coronary artery.  He also had  ligation of the left atrial appendage.  He had had a previous history of  sick sinus syndrome requiring implantation of a dual-chamber pacemaker.  The patient has been on  chronic Coumadin because of his history of  atrial fibrillation.  The Coumadin was recently held prior to his  colonoscopy of February 17, 2007, and a pro time in my office earlier  today was only 1.5 INR.  Fortunately, the patient has been remaining in  normal sinus rhythm.  The patient also has a longstanding history of  hypertension, diabetes and hyperlipidemia.   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  The patient lives with his wife.  He does  not smoke or drink alcohol.   The patient has history of allergy to METFORMIN, MEVACOR, BEXTRA and  ALTACE.   PRESENT MEDICATIONS:  1. Coumadin 5 mg two days a week, 2.5 mg five days a week.  2. Actos 45 mg daily.  3. Aspirin 81 mg daily.  4. Multivitamin one daily.  5. Vitamin C daily.  6. Metformin 500 mg b.i.d.  7. Amiodarone 200 mg one-half tablet daily.  8. Metoprolol 50 mg b.i.d.  9. Crestor 10 mg taking 1/2 tablet daily.  10.Omeprazole 20 mg at bedtime.  11.Lasix 40 mg taking half a tablet daily p.r.n.  12.Micardis 80 mg daily.   PAST SURGICAL HISTORY:  Several years ago he had removal of a colon  cancer.  He is now followed by Dr. Park Breed, his oncologist.   REVIEW OF SYSTEMS:  He has not been experiencing any recent chest pain  or angina.  He has not been aware of any recent racing of his heart.  He  denies any cough or sputum production.  He has not noticed any  hematochezia or melena.  He has not noticed any particularly dark urine.  Remainder of his review of systems is negative in detail.   PHYSICAL EXAM:  Blood pressure 140/70, pulse of 76, and he is in normal  sinus rhythm, temperature is 100.9.  HEENT:  There is no scleral icterus.  SKIN:  Warm to touch.  CHEST is clear to percussion and auscultation.  HEART:  A soft basilar systolic murmur.  There is no gallop.  ABDOMEN:  Soft and nontender.  The patient has no palpable masses.  EXTREMITIES:  No phlebitis or  edema.   Laboratory studies in my office include an AST of 235, ALT of 304, blood  sugar of 257 and a BUN of 25 and a creatinine of 1.5 today.  EKG this  evening is pending.  A chest x-ray is pending.   IMPRESSION:  1. Possible cholecystitis.  2. Elevated liver function tests.  3. Type 2 diabetes with hyperglycemia.  4. Status post coronary artery bypass graft July 07, 2005, by Dr.      Tyrone Sage.  5. Sick sinus syndrome with functioning dual-chamber pacemaker and      history of chronic Coumadin anticoagulation for paroxysmal atrial      fibrillation.  6. Hyperlipidemia.  7. Essential hypertension.   DISPOSITION:  I agree with plans as outlined by Dr. Matthias Hughs.  We will  monitor blood sugars and use a hospital sliding scale.  We will hold his  Coumadin until we know whether gallbladder surgery will be needed and  will cover him for now with low-dose Lovenox 40 mg subcutaneous daily.  Will follow with you.           ______________________________  Cassell Clement, M.D.     TB/MEDQ  D:  02/20/2007  T:  02/21/2007  Job:  [id number]   cc:   Bernette Redbird, M.D.

## 2010-10-04 ENCOUNTER — Telehealth: Payer: Self-pay | Admitting: *Deleted

## 2010-10-04 ENCOUNTER — Other Ambulatory Visit: Payer: Self-pay | Admitting: *Deleted

## 2010-10-04 DIAGNOSIS — I10 Essential (primary) hypertension: Secondary | ICD-10-CM

## 2010-10-04 MED ORDER — METOPROLOL TARTRATE 50 MG PO TABS: 50.0000 mg | ORAL_TABLET | Freq: Two times a day (BID) | ORAL | Status: DC

## 2010-10-04 NOTE — Telephone Encounter (Signed)
Advised wife of lab results.  

## 2010-10-04 NOTE — Progress Notes (Signed)
Advised wife  

## 2010-10-04 NOTE — Telephone Encounter (Signed)
Refilled metoprolol tart. 50mg 

## 2010-10-04 NOTE — Telephone Encounter (Signed)
Message copied by Regis Bill on Wed Oct 04, 2010 12:00 PM ------      Message from: Cassell Clement      Created: Mon Oct 02, 2010  5:22 PM       Please report.Blood sugar is better at 149.Liver tests are normal.Lipids are good.Hemoglobin is stable at 11.4.Thyroid function is normal.The kidney function is not as good so I want him to try to drink more water.  Continue same medication.

## 2010-10-06 NOTE — H&P (Signed)
NAME:  Jeffery Gibson, HANNER NO.:  [id number]   MEDICAL RECORD NO.:  [id number]           PATIENT TYPE:   LOCATION:                                 FACILITY:   PHYSICIAN:  Vesta Mixer, M.D. DATE OF BIRTH:  1928/10/06   DATE OF ADMISSION:  DATE OF DISCHARGE:                                HISTORY & PHYSICAL   Jeffery Gibson is an elderly gentleman with a history of coronary artery  disease.  He is status post PTCA in 2001.  He also has a history of sick  sinus syndrome status post pacemaker.  He is admitted now for heart  catheterization after having an abnormal stress test.   Jeffery Gibson is an elderly gentleman with a history of coronary artery  disease.  He had recurrent episodes of chest pain for the past several  weeks.  The pain was described as chest heaviness with radiation to left  arm.  It sometimes occurs when he is walking fast.  He has been seen by Dr.  Patty Sermons.  He recently had a stress Cardiolite study which reveals an  inferior wall defect.  His left ventricular systolic function is normal.  Because of this Cardiolite abnormality, he is now referred for heart  catheterization.  He denies any syncope or presyncope.  He denies any PND or  orthopnea.   CURRENT MEDICATIONS:  1. Aspirin 81 mg a day.  2. Amiodarone 100 mg a day.  3. Fosamax 0.4 mg a day.  4. Metoprolol 50 mg p.o. b.i.d.  5. Nifedipine 30 mg a day.  6. Lanoxin 0.125 mg twice a day.  7. Omeprazole 20 mg a day.  8. Lipitor 10 mg a day.  9. Glucophage 500 mg twice a day.  10.Amaryl 4 mg twice a day.  11.Actos 45 mg a day.  12.Cozaar 100 mg a day.  13.Centrum once a day.  14.Coumadin 2.5 mg 7 days a week.  15.Vitamin C 500 mg a day.  16.Dipyridamole once a day.   ALLERGIES:  He has no known drug allergies.   PAST MEDICAL HISTORY:  1. Coronary artery disease.  Status post PTCA of his distal right coronary      artery.  2. History of sick sinus syndrome - status post pacemaker  implantation by      Rosine Abe, M.D.  3. Dyslipidemia.  4. Chronic anticoagulation because of a atrial fibrillation.   SOCIAL HISTORY:  The patient is a nonsmoker.  He does not drink alcohol to  excess.   FAMILY HISTORY:  Is unremarkable.   REVIEW OF SYSTEMS:  As per HPI and is otherwise negative.   PHYSICAL EXAMINATION:  GENERAL:  He is an elderly gentleman in no acute  distress.  He is alert and oriented x3 and mood and affect are normal.  His  weight is 212.  VITAL SIGNS:  His blood pressure is 120/70 with heart rate of 74.  HEENT: Exam reveals 2+ carotids, no bruits, no JVD, no thyromegaly.  LUNGS: Clear to auscultation.  HEART: Regular rate S1-S2 with no murmurs.  ABDOMEN:  Good  bowel sounds and is nontender.  EXTREMITIES:  He has no clubbing, cyanosis or edema.  NEUROLOGY:  Nonfocal.   His EKG reveals normal sinus rhythm.  He has no acute ST or T-wave changes.  There is normal R-wave progression.   Jeffery Gibson presents with some episodes of angina which radiate out to his  left arm.  He has an abnormal Cardiolite scan which reveals a defect in the  inferior wall.  It is quite possible that he has restenosis of his right  coronary artery.  We have scheduled him for a catheterization with possible  angioplasty.  We have discussed the risks, benefits and options of heart  catheterization.  He understands and agrees to proceed.  All his other  medical problems remain stable.           ______________________________  Vesta Mixer, M.D.     PJN/MEDQ  D:  03/17/2006  T:  03/18/2006  Job:  [id number]   cc:   Cassell Clement, M.D.

## 2010-10-06 NOTE — Op Note (Signed)
NAME:  Jeffery Gibson, Jeffery Gibson NO.:  [id number]   MEDICAL RECORD NO.:  [id number]          PATIENT TYPE:  AMB   LOCATION:  [facility]                         [facility]AC[facility]   PHYSICIAN:  Boston Service, M.D.DATE OF BIRTH:  10/05/1928   DATE OF PROCEDURE:  06/06/2005  DATE OF DISCHARGE:                                 OPERATIVE REPORT   References made to office notes from May 08, 2005, March 26, 2005,  March 22, 2005, March 14, 2005, February 12, 2005, bone scan from  April 25, 2005 and prostate biopsy report from March 22, 2005.   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  The patient has multiple medical problems including pacemaker, arrhythmia,  diabetes and prior history of colon cancer. The decision made to proceed  with seed implant as primary therapy for what is felt to be localized  prostate cancer.   PREOPERATIVE DIAGNOSES:  Gleason 7 adenocarcinoma, 30% on the left.   PROCEDURE:  Transperineal placement of radioactive I-125 seeds seed implant  for prostate cancer.   SURGEON:  Boston Service, M.D., Maryln Gottron, M.D.   ANESTHESIA:  General.   SPECIMENS:  One extracted seed.   DRAINS:  18-French Foley.   ESTIMATED BLOOD LOSS:  Minimal.   COMPLICATIONS:  None obvious.   DESCRIPTION OF PROCEDURE:  The patient was prepped and draped in the  dorsolithotomy position after institution of an adequate level of general  anesthesia. A rectal probe, transrectal ultrasound probe, suprapubic  fluoroscopy unit, indwelling Foley catheter were all positioned  appropriately and duplicate images were obtained that matched pretreatment  planning images. Using [employer], sonographic planning was carried  out by duplicating transverse and longitudinal scans of the prostate  outlining the prostatic base, prostatic apex, margin with  the rectum and  path of the urethra. Once adequate planning had been achieved, a plan was  then created and executed. Total dose delivered 145.00 Gy,  total number of  needles 19, total number of activated needles 20, total number of seeds 55,  one pulled from the bladder. Total activity 34.9440 mCi, type of seed I-125.  Stabilizing needles were placed, needles carrying seeds were then placed  according to prearranged coordinates taking care to confirm the position of  the needle in both transverse and longitudinal direction. Once all seeds had  been placed needles were removed. Appropriate post-treatment images were  obtained. Indwelling Foley catheter was removed. Fiberoptic cystoscopy  showed a normal urethra and sphincter. The prostatic urethra showed evidence  of submucosal calculi. There was a single seed within the bladder. Clear  efflux right and left orifice. Seed was grasped with forceps and  then gently withdrawn, confirmed with staff to represent the seeds seen  endoscopically. The cystoscope was reinserted and no additional seeds were  identified within the bladder. The 18-French Foley was reinserted and left  to straight drain. The patient was returned to recovery in satisfactory  condition.  ______________________________  Boston Service, M.D.     RH/MEDQ  D:  06/06/2005  T:  06/06/2005  Job:  [id number]   cc:   Cassell Clement, M.D.  Fax: [phone]   Gita Kudo, M.D.  [address]  [place]  [place] [address]   Maryln Gottron, M.D.  Fax: [phone]   Drue Second, MD  Fax: [phone]

## 2010-10-06 NOTE — Op Note (Signed)
NAME:  KOLSON, CHOVANEC                        ACCOUNT NO.:  [id number]   MEDICAL RECORD NO.:  [id number]                   PATIENT TYPE:  INP   LOCATION:  3735                                 FACILITY:  [facility]   PHYSICIAN:  Gita Kudo, M.D.              DATE OF BIRTH:  1929-03-17   DATE OF PROCEDURE:  DATE OF DISCHARGE:                                 OPERATIVE REPORT   ADDENDUM:  I operated on Mr. Rae Roam on December 14, 2003, and dictated the  note at that date.  I am adding this as an addendum on December 15, 2003.   After the EEA device was fired and removed without incident.  It was opened  and the two donuts of proximal and distal colon were examined.  They were  intact and had good full thickness circumferential tissue.  After that was  done, we checked for leaks by insufflating air gently and there were none.                                               Gita Kudo, M.D.    MRL/MEDQ  D:  12/15/2003  T:  12/15/2003  Job:  [id number]

## 2010-10-06 NOTE — Op Note (Signed)
NAME:  Jeffery Gibson, Jeffery Gibson                        ACCOUNT NO.:  [id number]   MEDICAL RECORD NO.:  [id number]                   PATIENT TYPE:  INP   LOCATION:  2550                                 FACILITY:  [facility]   PHYSICIAN:  Gita Kudo, M.D.              DATE OF BIRTH:  01-17-1929   DATE OF PROCEDURE:  12/14/2003  DATE OF DISCHARGE:                                 OPERATIVE REPORT   PREOPERATIVE DIAGNOSIS:  Cancer of the colon.   POSTOPERATIVE DIAGNOSIS:  Cancer of the colon with adherence to the bladder.   OPERATIVE PROCEDURE:  Laparotomy, resection of rectosigmoid carcinoma with  in-continuity partial cystectomy.   SURGEON:  Gita Kudo, M.D.   ASSISTANT:  Sheppard Plumber. Earlene Plater, M.D.   ANESTHESIA:  General.   CLINICAL SUMMARY:  A 77 year old man brought in after elective bowel prep  and medical stabilization for colon resection.  He has a significant  coronary artery history, has a pacemaker, he has treated diabetes.  Colonoscopy revealed a biopsy-proven carcinoma at approximately 22 cm.  CT  scan of the abdomen showed possible small lesions in the liver, CEA was  normal.  Chest x-ray showed no lesions.   OPERATIVE FINDINGS:  The patient had a very large, bulky tumor of the distal  sigmoid adherent to the bladder.  There was no fluid in the abdomen, no  enlarged nodes.  The liver felt and looked normal.  The ureters were not  injured.  We stayed well medial to them.   OPERATIVE PROCEDURE:  Under satisfactory general endotracheal anesthesia,  the patient was positioned and underwent rigid sigmoidoscopy to 25 cm  without seeing the lesion.  I felt comfortable that we could do this without  a colostomy.  He was placed in the stirrups and prepped and draped in the  standard fashion.  He received IV Cefotan and IM heparin preop as well as  placed with PAS hose.  Foley catheter and NG tubes placed.   A generous midline incision was made and carried into the peritoneum.   The  large, bulky tumor was encountered.  It had not broken through the bowel  wall, but it was quite adherent to the bladder.  A manual laparotomy and  visual laparotomy were performed showing no other lesions.  Following this,  self-retaining retractors were placed and the lateral peritoneal reflections  were divided with cautery.  Then with traction up on the colon and sutures  placed on the bladder for markers, a circumferential incision made with  cautery into the bladder itself and went all around, leaving a portion of  the bladder wall attached to the colon.  It looked as though this was just  bladder mucosa and not definitely malignancy of the colon coming in.  Then  the defect in the bladder was closed with two 2-0 chromic running sutures.   Then exposure was re-obtained and using self-retaining retractors, the  colon  was mobilized laterally using cautery and then medially the mesentery was  incised down to the pelvic brim, then carried across anteriorly on the  rectum.  It was felt best to remove the colon from above downward and,  accordingly, proximal to the tumor the bowel was divided with the GIA  stapler.  Then the mesentery was taken with clamps and ties of 2-0 silk and  the lateral attachments divided with the cautery, staying out as wide as we  could.  Then approximately 5 cm distal to the lesion the bowel was  transected with a Contour stapler and then the most distal mesentery was  divided with the same device.  The mesenteric dissection was completed and  the specimen removed and sent to pathology.   Then an incision made in the side of the proximal sigmoid after occluding  clamps were placed.  The 29 cm EEA stapler was chosen and the anvil placed  in and controlled with a 2-0 Prolene pursestring followed by a second one to  obtain good approximation.  A little bit of fat in the proposed suture line  was removed, and there was good healthy bowel proximally.   Then  Dr. Earlene Plater went distally and placed the EEA stapling device through the  rectum.  The device was advanced and the spike came through, hooked to the  anvil and then clamped down and fired.  After firing, rigid sigmoidoscopy  performed showing a patent anastomosis at about 20 cm with no evidence of  any air leak upon air insufflation while saline was in the pelvis.  Then the  EEA was removed, gowns and gloves were changed.   The pelvis suctioned out.  There was no available mesentery to really close  since it lay so well.  A little more peritoneum was incised laterally to  allow a tension-free anastomosis.  A small serosal defect of the proximal  sigmoid was repaired with three interrupted silk sutures.  Then sponge and  needle counts were done and correct.  Abdomen again lavaged with saline and  suctioned dry.  NG tube checked for good placement and left.  Omentum  brought down.   Abdomen then closed with a running double-stranded #1 PDS suture for midline  closure and staples for the skin.  Sterile absorbent dressings were applied  and the patient went to the recovery room from the operating room in good  condition without complication.                                               Gita Kudo, M.D.    MRL/MEDQ  D:  12/14/2003  T:  12/14/2003  Job:  [id number]   cc:   Cassell Clement, M.D.  [address], [address]  [place]  [place] [address]  Fax: [phone]   Bernette Redbird, M.D.  [address]  [place], [place] [address]  Fax: [phone]

## 2010-10-06 NOTE — Cardiovascular Report (Signed)
[place]  Patient:    Jeffery Gibson, Jeffery Gibson                     MRN: [id number] Proc. Date: 08/30/99 Adm. Date:  [id number] Disc. Date: [id number] Attending:  Corliss Marcus                        Cardiac Catheterization  HISTORY:  Mr. Siebenaler is a 77 year old gentleman who is admitted with chest pain. He was referred for catheterization for further evaluation.  PROCEDURES PERFORMED:  Left heart catheterization with PTCA of the distal right  coronary artery.  DESCRIPTION OF PROCEDURE:  The right femoral artery was easily cannulated using a modified Seldinger technique.  HEMODYNAMICS:  The left ventricular pressure was 181/23, with an aortic pressure of 184/95.  ANGIOGRAPHY: 1. The left main coronary artery is large, smooth, and normal.  It trifurcates nto    an LAD, ramus intermediate branch, and large circumflex artery.  2. The left anterior descending artery is a fairly large vessel.  There are minor    to moderate luminal irregularities throughout the proximal and mid LAD.  The    tightest stenosis is approximately 30%.  The LAD gives off a large diagonal    branch and then continues to have mild irregularities.  Further down the LAD is    relatively normal.  The LAD supplies the apex and a small amount of the    inferoapical wall.  The diagonal branch is a large and normal vessel.  3. The ramus intermediate vessel is a large branch.  It has minor luminal    irregularities with between 30% and 40% stenoses.  None of the lesions are    critical.  4. The left circumflex artery is an extremely large vessel.  It gives off a    large first and second obtuse marginal artery.  There is a 30% to 40% stenosis    in the proximal section of the first OM.  5. The right coronary artery is a moderate sized vessel and is dominant.  There is    a mid 50% stenosis.  This stenosis is quite eccentric and is on a bend.  In    other views, this lesion  does not appear to be any more than 30%.  The distal    right coronary artery has a 95% stenosis, with TIMI-2 flow.  The PDA is    unremarkable.  LEFT VENTRICULOGRAM:  The left ventriculogram was performed in a 30 degree RAO position.  It revealed overall normal left ventricular systolic function, with n ejection fraction of 65% to 70%.  There was no significant mitral regurgitation.  PTCA PROCEDURE:  The patient was given 3000 units of heparin.  He was given a double-bolus Integrilin infusion followed by continuous infusion.  The right coronary artery was engaged using a 7-French JR-4 guide.  A traverse 0.014 angioplasty wire was passed down across the distal right coronary artery stenosis. A 3.0 x 15 mm CrossSail balloon was positioned across the stenosis and was inflated once up to 8 atm for a total of 60 seconds.  This resulted in marked improvement of the vessel lumen with essentially no significant restenosis.  There was brisk flow through the lesion.  The more proximal right coronary stenosis was not affected.  COMPLICATIONS:  None.  CONCLUSIONS: 1. Successful percutaneous transluminal coronary angioplasty of the distal right    coronary artery. 2.  Well preserved left ventricular systolic function.  PLAN:  We will treat the more proximal right coronary lesion medically.  The patient may need to have a Cardiolite scan in the near future. DD:  09/15/99 TD:  09/16/99 Job: [id number]

## 2010-10-06 NOTE — Consult Note (Signed)
NAME:  Jeffery Gibson, Jeffery Gibson NO.:  [id number]   MEDICAL RECORD NO.:  [id number]          PATIENT TYPE:  INP   LOCATION:  2907                         FACILITY:  [facility]   PHYSICIAN:  Sheliah Plane, MD    DATE OF BIRTH:  Oct 27, 1928   DATE OF CONSULTATION:  05/01/2006  DATE OF DISCHARGE:                                 CONSULTATION   REQUESTING PHYSICIAN:  Dr. Learta Codding CARDIOLOGIST:  Dr. Elease Hashimoto   PRIMARY CARE PHYSICIAN:  Dr. Patty Sermons   REASON FOR CONSULTATION:  Recurrent coronary occlusive disease status  post stent placement October 31.   REASON FOR CONSULTATION:  Unstable angina.   HISTORY OF PRESENT ILLNESS:  The patient is a 77 year old male, who had  his first known coronary event in 1989 and, at that time, had an  angioplasty performed.  He has done well until he had a pacemaker placed  because of sick sinus syndrome.  The patient had acute myocardial  infarction and angioplasty in 1986.  He had sick sinus syndrome and a  DDD pacemaker placed in 1997 and a replacement of the generator in 2006.   In late October of this year, he presented with unstable anginal  symptoms.  He underwent cardiac catheterization by Dr. Elease Hashimoto.  At that  time, he had evidence of three-vessel disease but with a very high grade  mid LAD lesion which was angioplastied and a stent placed.  The  patient's current records do not indicate this was a drug-coated stent,  but it is presumed that it was.  Two weeks ago, the patient had a  recurrent episode of substernal chest pain, had been on Coumadin for 1  episode of atrial fibrillation in June 2007.  The Coumadin was being  held, and he was scheduled for a repeat cath when he had second episode  of unstable angina with very minimal activity and came to the emergency  room; it was noted on April 29, 2006.  Troponins have been negative,  and CK-MBs have been negative since admission.  Cardiac risk factors  include  hypertension, type 2 diabetes for several years, history of  hyperlipidemia, is a remote smoker but quit in 1964.   FAMILY HISTORY:  The patient gives no history of premature cardiac  disease in his family.  He denies previous stroke, denies claudication,  denies renal insufficiency.   PAST MEDICAL HISTORY:  1. As noted above.  The patient has had 1 episode of paroxysmal atrial      fibrillation.  2. History of permanent pacemaker for sick sinus syndrome.  3. History of hypertension.  4. Dyslipidemia.  5. Noninsulin-dependent diabetes.  6. History of trauma and fracture to the left tibial area with      internal rods and screws and history of prostate cancer diagnosed      in January of this year, treated with radiation seed implants.   PAST SURGICAL HISTORY:  1. Placement of right subclavian pacemaker generator and revision.      When his pacemaker was placed, he had a syncopal episode, fell, and  had a fracture of his left tibial area that was repaired with      screws and plates.   SOCIAL HISTORY:  The patient is married.  In addition, his daughter  lives in [place] and works as an Airline pilot.  The  patient does not smoke and denies alcohol use.  He is a retired Physicist, medical.   MEDICATIONS AT TIME OF ADMISSION:  1. Plavix, Cozaar, __________, Actos, Nifedipine, digoxin, metoprolol,      aspirin, __________ , Crestor, Flomax, amiodarone, Metformin, and      Coumadin.  The patient's Coumadin was stopped 4 days prior to      admission.   DRUG ALLERGIES:  The patient's chart notes LATEX as an allergy.  When  questioned, the patient is totally unaware of the basis for this, notes  that he frequently uses latex gloves at home with no reaction.  He does  remember getting a rash on his back following colon resection for  carcinoma of the colon.   REVIEW OF SYSTEMS:  CARDIAC:  Positive for chest pain, denies resting  shortness of breath, denies  exertional shortness of breath, denies  orthopnea, syncope, presyncope with exception of an episode prior to  placement of his pacemaker in 1986.  Denies palpitations, denies lower  extremity edema.  GENERAL:  The patient without hemoptysis or shortness of breath.  GASTROINTESTINAL:  Without active bleeding.  NEUROLOGIC:  The patient  denies amaurosis or TIAs.  MUSCULOSKELETAL:  Without significant  complaint.  No recent infections.  HEMATOLOGIC:  Denies blood in his  stool or urine.  Does relate easy bruisability.  ENDOCRINE:  Denies  amaurosis and TIAs.  There are no constitutional symptoms such as fever,  chills, or night sweats.   PHYSICAL EXAMINATION:  VITAL SIGNS:  The patient's blood pressure  135/60, heart rate is 68 in sinus.  GENERAL:  The patient is awake, alert, and neurologically intact, is  able to relay his history without difficulty.  HEENT:  Pupils equal, round, and reactive to light.  NECK:  With slight parathyroid adenopathy bilaterally.  He has 2+ DP and  PT.  He has no carotid bruits.  CARDIAC:  Normal S1 and S2 without murmur, rub, or gallop.  ABDOMEN:  Benign without palpable masses.  LOWER EXTREMITIES:  The patient has 2+ DP and PT pulses bilaterally.  He  has venostasis changes and homocysteine deposits in the stent of the  lower legs.  Left leg is much greater than the right.  This is in the  area related to his previous fracture and repair with screws and plates.   LABORATORY FINDINGS:  Sodium 131, potassium 3.8, chloride 100, BUN 27,  glucose 117, BUN 16, creatinine 123, hematocrit 28, hemoglobin 9.9.   IMPRESSION:  After reviewing the cardiac catheterization films which  demonstrated significant three-vessel coronary artery disease including  proximal right coronary artery of 80%, 70% stenosis.  Proximal left  anterior descending has a diffuse 50% lesion.  Ejection fraction 40%.   Preop echocardiograms were done revealing radial compression,  decreasing radial pulse is 40-60% right coronary plaque.  The patient with severe  three-vessel disease and recurrent stenosis of the stent site on Plavix  which he got today.  Agree that the patient would benefit from coronary  artery bypass grafting.  The coronary artery bypass grafting is  recommended to the patient who agrees.  The risks and benefits of the  operation are discussed in detail including the  risk of death,  infection, stroke, myocardial infarction, bleeding, blood transfusion.  The patient is currently on Plavix which he will need to have a period  of wash-out between 5-7 days before we proceed with  bypass surgery to decrease the risk of bleeding.  Because of the  patient's critical anatomy and symptoms, he should remain in the  hospital until bypass surgery can be accomplished.  The patient has had  his questions answered and is willing to proceed with coronary artery  bypass grafting.      Sheliah Plane, MD  Electronically Signed     EG/MEDQ  D:  05/01/2006  T:  05/01/2006  Job:  [id number]   cc:   Vesta Mixer, M.D.

## 2010-10-06 NOTE — Cardiovascular Report (Signed)
NAME:  [name], [name] NO.:  [id number]   MEDICAL RECORD NO.:  [id number]          PATIENT TYPE:  OBV   LOCATION:  2807                         FACILITY:  [facility]   PHYSICIAN:  Vesta Mixer, M.D. DATE OF BIRTH:  1928-09-23   DATE OF PROCEDURE:  05/01/2006  DATE OF DISCHARGE:                            CARDIAC CATHETERIZATION   Jeffery Gibson is a 77 year old gentleman.  He recently presented with  unstable angina and had a stent placed in his proximal LAD.  He was  noted to have moderate to severe diffuse irregularities in some of the  other vessels.   He presented with recurrent episodes of chest discomfort the other day.  He was scheduled for heart catheterization.   The right femoral artery was easily cannulated using the modified  Seldinger technique.   HEMODYNAMIC RESULTS:  LV pressure is 151/15 with an aortic pressure of  151/60.   ANGIOGRAPHY:  Left main.  The left main is smooth and normal.   The left anterior descending artery reveals proximal diffuse  irregularities of 50%.  These were basically unchanged from his previous  catheterization.  The mid LAD stent has a tight 95-99% in-stent  restenosis.  This is in the proximal aspect of the stent.  The remainder  of the stent appears to be unremarkable.  There is a moderate-size  diagonal branch which has minor luminal irregularities.   There is a ramus intermediate branch which has diffuse 40-50% stenosis  in the proximal segment.  The left circumflex artery is a very large  vessel.  There are tandem 50% stenoses in the proximal aspect of the  vessel.   The right coronary artery is large and dominant.  There is a new  ruptured plaque in the proximal aspect of the right coronary artery  which represents an 80% stenosis.  This was followed by an eccentric 70-  80% stenosis that was present during his last catheterization.  The mid  and distal RCA have minor luminal irregularities.  The  posterior  descending artery is unremarkable.   The left ventriculogram was performed in 30 RAO position.  It reveals  mildly depressed left ventricular systolic function.  There is mild  hypokinesis of the anterior and apical walls.  Ejection fraction is  around 40%.  There is mild mitral regurgitation.   COMPLICATIONS:  None.   CONCLUSIONS:  1. Diffuse three-vessel coronary artery disease.  The patient has      moderate to severe coronary artery disease.  He has a tight left      anterior descending artery stenosis that did not respond well at      all to stenting.  He has diffuse disease in his proximal left      anterior descending, and I do not think that further stenting is      going to help him long term.  I think our best option at this point      is bypass grafting.  It is surprising that he should develop some      with restenosis within the stent within 6 weeks' time.  He will need to discontinue the Plavix.  We will continue with  Integrilin.   He is having a little bit of chest and back pain at this time.  Will  move him to the step-down unit.  We will anticipate surgery next week.           ______________________________  Vesta Mixer, M.D.     PJN/MEDQ  D:  05/01/2006  T:  05/01/2006  Job:  [id number]   cc:   Cassell Clement, M.D.  CVTS

## 2010-10-06 NOTE — Consult Note (Signed)
NAME:  Jeffery Gibson, Jeffery Gibson                        ACCOUNT NO.:  [id number]   MEDICAL RECORD NO.:  [id number]                   PATIENT TYPE:  INP   LOCATION:  2550                                 FACILITY:  [facility]   PHYSICIAN:  Elmore Guise., M.D.           DATE OF BIRTH:  August 28, 1928   DATE OF CONSULTATION:  12/14/2003  DATE OF DISCHARGE:                                   CONSULTATION   REFERRED BY:  Gita Kudo, M.D.   REASON FOR CONSULTATION:  Dyspnea and desaturation.   HISTORY OF PRESENT ILLNESS:  This 77 year old white male with a past medical  history of coronary artery disease  (status post PCI of the RCA in 2001),  hypertension, dyslipidemia and type 2 diabetes mellitus was admitted for  elective colon resection.  The patient actually reports no cardiac  complaints.  His cardiac workup prior to his colon resection included an  adenosine Cardiolite which showed an ejection fraction of 51%, with no wall  motion abnormalities and no areas of ischemia.  The patient also reports  that he has been active, without any significant problems prior to his  operation.  By report, his operation was uneventful.  No metastasis was  seen. Post-surgery after extubation the patient with a 30-minute period of  tachypnea with respiratory rate up to 30, desaturation with O2 saturations  in the mid-80's, requiring 100% non-rebreather to maintain saturations  greater than 92%.  At that time the patient was without any significant  complaint; however, reported that he just could not catch his breath.  This  situation resolved spontaneously over the next 10-15 minutes.  His O2  saturations continued to improve.  His oxygen requirement decreased.  The  patient started to take slowed deep breaths with respiratory rates  decreasing into the 15-20 range.  O2 saturations increased to 96% to 97% on  5 L nasal cannula.  The patient noted significant improvement after being  given some pain  medication.  Currently the patient reports diffuse abdominal  pain, otherwise no significant complaints.  He reports his breathing has had  significant improvement; however, he can't take a deep breath because it  hurts.  No chest pain.   CURRENT HOME MEDICATIONS:  1. Procardia XL 30 mg daily.  2. Propranolol 20 mg daily.  3. Aspirin 81 mg daily.  4. Dipyridamole daily.  5. Nitroglycerin p.r.n.  6. Lipitor 10 mg daily.  7. Protonix 40 mg daily.  8. Actos 45 mg daily.  9. Diovan 50 mg daily.  10.      Motrin 600 mg on a p.r.n. basis.   ALLERGIES:  No known drug allergies.   FAMILY HISTORY:  Noncontributory.   SOCIAL HISTORY:  He is married.  He has four grown children.  Denies tobacco  or alcohol.   PHYSICAL EXAMINATION:  VITAL SIGNS:  Blood pressure 163/100, heart rate  80's, respirations 16, O2 saturation 96% on  5 L nasal cannula.  GENERAL:  He is an elderly white male, alert and oriented x4, in no acute  distress.  NECK:  Supple, no lymphadenopathy, with 2+ carotids.  No jugular venous  distention.  LUNGS:  Clear with mildly decreased breath sounds in the bases.  HEART:  Regular with normal S1 and S2.  No significant murmurs, gallops, or  rubs.  ABDOMEN:  Is diffusely tender; however, soft, with mild distention and  decreased bowel sounds throughout.  EXTREMITIES:  Show no edema.  A preoperative electrocardiogram shows a normal sinus rhythm with  ventricular pacing.  A postoperative electrocardiogram is unchanged.   LABORATORY DATA:  Show a preoperative WBC of 7.2, hemoglobin 10.6, platelet  count 271, electrolytes all within normal limits.  BUN and creatinine 12 and  1.4.  A chest x-ray shows bilateral basilar atelectasis, left greater than right.  A pacemaker in the right upper chest with lead placement being normal, and  an NG tube in the stomach, but no other changes noted.   IMPRESSION:  1. Desaturation and tachypnea episode, most likely multifactorial and      secondary to prolonged clearance of recently-administered anesthesia,     atelectasis from recent surgery, and significant abdominal pain with deep     inspiration, all causing episode.  2. History of coronary artery disease.  3. History of dyslipidemia.  4. History of diabetes.   PLAN:  1. From the cardiovascular standpoint, the patient actually is doing     remarkably better.  His condition actually resolved spontaneously, and     was most likely due to early postoperative course and decreased clearance     from his general anesthesia medications.  At this time I do recommend     continued perioperative beta blocker as tolerated, and once aspirin is     okay from the surgical standpoint, start him back on baby aspirin as     tolerated.  Once the patient starts taking p.o., we will start back his     normal medications, as tolerated.  2. In the meantime, would recommend telemetry for 24 hours, and drawing     cardiac enzymes x3.  The patient is currently resting comfortably using     his PCA epidural for pain control.  He will be transported to a telemetry     room.  We will follow him in his postoperative course.                                               Elmore Guise., M.D.    TWK/MEDQ  D:  12/14/2003  T:  12/14/2003  Job:  [id number]

## 2010-10-06 NOTE — H&P (Signed)
NAME:  Jeffery Gibson NO.:  [id number]   MEDICAL RECORD NO.:  [id number]           PATIENT TYPE:   LOCATION:                               FACILITY:  [facility]   PHYSICIAN:  Vesta Mixer, M.D. DATE OF BIRTH:  1928/05/27   DATE OF ADMISSION:  05/01/2006  DATE OF DISCHARGE:                              HISTORY & PHYSICAL   Jeffery Gibson is an elderly gentleman with a history of coronary artery  disease.  He is status post recent PTCA and stenting of his distal left  anterior descending artery.  He was noted at that time to have diffuse  irregularities involving the first diagonal artery, the left circumflex  artery, and the right coronary artery.  He is now admitted for heart  catheterization for further evaluation of these chest pains.   Jeffery Gibson has been a relatively healthy gentleman.  He was seen about a month  ago after having some chest pains and an abnormal Cardiolite study.  We  placed a stent in his distal left anterior descending artery.  He was  noted to have a moderate to severe stenosis in his right coronary artery  but it did not appear to be critical at that time.  He has done well  with medical therapy but for the past day or so he has had some episodes  of chest pain.  These episodes have occurred when he was climbing stairs  and another time when he was walking out from the car.  He also  describes and another type of separate chest pain that occurs when he is  lying on his left side in bed.  This pain does not sound anginal.  He  took nitroglycerin with the episodes of angina which promptly relieved  the pain.  He denies any syncope or presyncope.   CURRENT MEDICATIONS:  1. Aspirin 81 mg a day.  2. Amiodarone 100 mg a day.  3. Flomax 0.4 mg a day.  4. Metoprolol 50 mg every 8 hours.  5. Nifedipine slow release 30 mg a day.  6. Lanoxin 0.125 mg twice a day.  7. Omeprazole 20 mg a day.  8. Glucophage 500 mg p.o. b.i.d.  9. Amaryl 8 mg a day.  10.Actos 45 mg a day.  11.Cozaar 100 mg a day.  12.Centrum once a day.  13.Coumadin 2.7 mg 7 days a week.  14.Vitamin C once a day.  15.Dipyridamole once a day.  16.Plavix 75 mg a day.  17.Crestor 10 mg a day.   ALLERGIES:  NONE.   PAST MEDICAL HISTORY:  1. Coronary artery disease.      a.     He is status post PTCA and stenting of his distal right       coronary artery in the past.  2. History of sick sinus syndrome, status post pacemaker implantation      by Dr. Rosine Abe.  3. Dyslipidemia.  4. Chronic anticoagulation.  5. Atrial fibrillation.   SOCIAL HISTORY:  The patient is a nonsmoker.  He does not drink alcohol  to excess.   FAMILY  HISTORY:  Unremarkable.   REVIEW OF SYSTEMS:  As mentioned in the HPI and is otherwise  unremarkable.   PHYSICAL EXAMINATION:  GENERAL:  He is an elderly gentleman in no acute  distress.  He is alert and oriented did x3.  His mood and affect are  normal.  VITAL SIGNS:  His weight is 208 which is down 4 pounds.  Blood pressure  is 130/70 with a heart rate of 74.  HEENT:  Reveals 2+ carotids.  He has no bruits, no JVD, no thyromegaly.  LUNGS:  Clear to auscultation.  HEART:  Regular rate S1-S2.  ABDOMEN:  Reveals good bowel sounds and is nontender.  EXTREMITIES:  He has no clubbing, cyanosis or edema.  NEUROLOGIC:  Nonfocal.   [name] presents with some episodes of angina-like chest pain.  He is just  one month out from placement of a 2.25 x 12-mm mini vision stent to his  mid LAD.  I would like to perform heart catheterization because of his  episodes of angina.  He also has a moderate stenosis involving his right  coronary artery which we will need to re-evaluate.  We have discussed  the risks, benefits and options of heart catheterization.  He  understands and agrees to proceed.   His EKG from Monday reveals a normal sinus rhythm.  He has a first-  degree AV block.  He has minor ST-T wave changes.            ______________________________  Vesta Mixer, M.D.     PJN/MEDQ  D:  04/26/2006  T:  04/27/2006  Job:  [id number]   cc:   Cassell Clement, M.D.

## 2010-10-06 NOTE — Cardiovascular Report (Signed)
NAME:  Jeffery Gibson, Jeffery Gibson NO.:  [id number]   MEDICAL RECORD NO.:  [id number]          PATIENT TYPE:  OIB   LOCATION:  NA                           FACILITY:  [facility]   PHYSICIAN:  Elmore Guise., M.D.DATE OF BIRTH:  12-15-1928   DATE OF PROCEDURE:  03/02/2005  DATE OF DISCHARGE:                              CARDIAC CATHETERIZATION   INDICATIONS FOR PROCEDURE:  Generator at elective replacement interval.   PROCEDURE DESCRIPTION:  The patient was brought to the cardiac cath lab at  appropriate informed consent.  He was prepped and draped in a sterile  fashion.  Approximately 10 cc of 1% lidocaine was used for local anesthesia.  A 2 inch incision was made in the right deltopectoral groove.  Bovie  dissection was then carried out to the generator.  The generator was exposed  and removed from the pocket without difficulty.  The leads did have some  scarring down in the inferior portion of his pocket and these were freed by  Bovie cautery.  His leads were interrogated and functioned appropriately.  The lead measurements are as follows:  P waves measured 3.4 mV, with an  impedence of 347 ohms, threshold of 0.7 volts and 0.5 msec with a current  width of 2.7 mU.  Ventricular lead measurements:  R wave measured 9.3 mV  with impedence of 358 ohms, threshold 1.0 volts at 0.5 msec with a current  of 2.8 milliunits.  The atrial lead is a Development worker, international aid model 1388TC, 45-cm,  serial number [id number]; date of implant March 27, 1996.  Ventricular lead  is a Development worker, international aid, model 1388TC, 52-cm, serial number [id number]; date of  implant March 27, 1996.  His previous pacemaker was a [employer] Paragon II,  model 2016, serial number [id number]; date of implant March 27, 1996.  This  was removed from the atrial and ventricular leads.  The atrial lead was then  identified and placed in the new  [employer]  Medtronic generator.  The  ventricular lead was then freed and placed in the ventricular  portion.  The  device was sewn into the pocket.  The pocket was then closed in three layers  with 2-0, followed by 2-0, followed by 4-0 Vicryl suture.  The patient  tolerated the procedure well.  No apparent complications.  He will be  transferred from the cardiac cath lab in stable condition for recovery.      Elmore Guise., M.D.  Electronically Signed     TWK/MEDQ  D:  03/02/2005  T:  03/02/2005  Job:  [id number]

## 2010-10-06 NOTE — Discharge Summary (Signed)
NAME:  Jeffery Gibson, Jeffery Gibson NO.:  [id number]   MEDICAL RECORD NO.:  [id number]          PATIENT TYPE:  INP   LOCATION:  2037                         FACILITY:  [facility]   PHYSICIAN:  Sheliah Plane, MD    DATE OF BIRTH:  [birth year]   DATE OF ADMISSION:  04/29/2006  DATE OF DISCHARGE:                               DISCHARGE SUMMARY   PRIMARY DIAGNOSIS:  Coronary occlusive disease with unstable angina.   IN-HOSPITAL DIAGNOSES:  1. 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  2. Acute blood loss anemia postoperatively.  3. Continued postoperative proximal atrial fibrillation.   SECONDARY DIAGNOSES:  1. History of paroxysmal atrial fibrillation.  2. History of permanent pacemaker for sick sinus syndrome.  3. Hypertension.  4. Hyperlipidemia.  5. Non-insulin-dependent diabetes mellitus.  6. History of trauma and fracture to left tibial.  Bone with internal      rods and screws.  7. A history of prostate cancer, status post radiation with seed      implants.  8. History of coronary artery disease, status post angioplasty and      stent placement to left anterior descending artery.   IN-HOSPITAL OPERATIONS/PROCEDURES:  1. Cardiac catheterization.  2. Coronary artery bypass grafting x4, using left internal mammary      artery to left anterior descending coronary artery; reverse      saphenous vein graft to diagonal coronary artery; reverse saphenous      vein graft to circumflex coronary artery; reverse saphenous vein      graft to distal right coronary artery.  Endo vein harvesting done.  3. Repair of innominate vein.  4. Ligation of left atrial appendage.   The patient's history and physical and hospital course:  The patient is  a 77-year male who had his first known coronary event in 29.  At that  time, he had angioplasty performed.  The patient had done well until he  required a pacemaker due to sick sinus syndrome.  In late October 2007,  he presented with some stable angina  symptoms.  He was taken to the cath  lab, where he underwent cardiac catheterization by Dr. Elease Hashimoto in  October.  At that time, there was evidence of three-vessel disease but  with a very high grade mid LAD lesion which had angioplasty and stent  placed.  Recently, the patient had a recurrent episode of substernal  chest pain, had been Coumadin for his one episode of atrial  fibrillation, June 2007.  Cardiac enzymes were obtained which were  negative.  The patient was taken to the cath lab by Dr. Elease Hashimoto on  May 01, 2006, demonstrating severe three-vessel coronary artery  disease with in-stent restenosis.  Following catheterization, Dr.  Tyrone Sage was consulted.  Dr. Tyrone Sage discussed with the patient  undergoing coronary artery bypass grafting.  He discussed due to the  patient being on Plavix, that he will need to wait 5 days to wash out  the Plavix prior to undergoing surgery.  He discussed this.  The patient  continued to have chest pain or any EKG changes will require surgery  sooner than the  5 days.  The patient acknowledges understanding and  agreed to proceed.  Surgery was scheduled for May 06, 2006.  Prior to undergoing  coronary bypass grafting, the patient underwent preoperative carotid  duplex ultrasound.  This showed 40-60% bilateral ICA stenosis.  He also  had bilateral ABIs done which showed greater than 1.  The patient  remained stable prior to May 06, 2006.  For details of the  patient's past medical history and physical exam, please see dictated  history and physical.   The patient was taken to the operating room May 06, 2006, where he  underwent coronary artery bypass grafting x4, using left internal  mammary artery to left anterior descending coronary artery; reverse  saphenous vein graft to diagonal coronary artery; reverse saphenous vein  graft to circumflex coronary artery; reverse saphenous vein graft to  distal right coronary artery.  Endo vein  harvesting was done from the  right leg.  The patient also required intraoperative repair of  innominate vein as well as ligation of the left atrial appendage.  The  patient tolerated this procedure well and was transferred to the  intensive care unit in stable condition.   Following surgery the patient seemed to be hemodynamically stable with  hematocrit of 32%.  He was extubated the evening of surgery.  Following  extubation, the patient was seen to be alert and oriented x4.  Post-op  day #1, the patient was noted to have a hemoglobin and hematocrit drop  to 7.7 and 22.4%.  He did receive a packed red blood cell transfusion at  that time.  His hemoglobin/hematocrit were monitored closely.  The  patient continued to have a low hemoglobin/hematocrit.  Stool was  checked for blood.  It was noted to be negative.  By post-op day #4, the  patient's hemoglobin/hematocrit stabilized.  It was 8.5 and 25.3.  He  was started on iron p.o.  The patient was asymptomatic from his acute  blood loss anemia.  This will be checked prior to the patient's  discharge home.   Also postoperatively, the patient had got into postoperative atrial  fibrillation.  He was continued on his amiodarone preoperatively.  The patient was continued on the p.o. amiodarone and monitored closely.  He continued to go in and out of atrial fibrillation.  His amiodarone as  well as his Lopressor were increased.  The patient was on Coumadin  preoperatively due to his one episode of atrial fibrillation.  It was  felt that if he continued to go in and of atrial fibrillation he will  need to be restarted on Coumadin.  Currently, the patient is in a normal  sinus rhythm with a heart rate in the 80s.  Postoperatively, the  patient's chest tubes and lines were discontinued in a normal fashion.  He was transferred to 2000 on post-op day #3 in a stable condition.  His vital signs were monitored closely and seen to be stable.  He was  able  to be weaned off oxygen, sating greater than 90% on room air.  The  patient was afebrile.   Due to the patient's history of diabetes mellitus, his blood sugars were  monitored closely.  He was started on low-dose Lantus insulin  immediately postoperatively.  Over next several days and with evaluation  creatinine being the patient baseline, he was restarted on p.o. diabetic  medication.  Lantus was discontinued.  Blood sugars were continued to be  monitored closely and seen to be stable.  The patient's incisions were clean, dry and intact and healing well.  He  was out of bed ambulating well with assistance.  The patient was  tolerating a regular diet well.  No nausea, vomiting noted.  Bowel  movements within normal limits.  During the patient's postoperative  course [facility] continued to assist in the patient's care.   The patient is tentatively ready for discharge home in the next 2 days  pending heart rate.  Discharge could be held due to requiring initiation  of Coumadin and reaching therapeutic levels.   FOLLOWUP APPOINTMENTS:  1. A followup appointment will be scheduled with Dr. Tyrone Sage for in 3      weeks.  Our office will contact the patient with this information.  2. The patient will need to follow up with Dr. Elease Hashimoto in 2 weeks.  He      will need to contact Dr. Harvie Bridge office to schedule a followup      appointment.   ACTIVITY:  1. The patient was instructed no driving till released to do so, no      heavy lifting over 10 pounds.  2. He was told to ambulate 3-4 times per day, progress as tolerated.  3. Continue his breathing exercises.   INCISIONAL CARE:  The patient is told to shower, washing his incisions  using soap and water.  He is to contact the office if he develops any  drainage or opening from any of his incision sites.   DIET:  The patient was educated on diet to be low-fat, low-salt, as well  as carbohydrate modified medium calorie  diet.   DISCHARGE MEDICATIONS:  1. Aspirin 325 mg daily.  2. Lopressor 25 mg b.i.d.  3. Cozaar 25 mg daily.  4. Crestor 10 mg at night.  5. Flomax 0.4 mg daily.  6. Amiodarone 200 mg b.i.d.  7. Niferex 150 mg daily.  8. Omeprazole 20 mg daily.  9. Metformin 500 mg b.i.d.  10.Glimepiride 4 mg b.i.d.  11.Actos 45 mg daily.  12.Folic acid 1 mg daily.  13.Oxycodone 5 mg, 102 tablets q.4-6 h. p.r.n. pain.      Theda Belfast, [place]      Sheliah Plane, MD  Electronically Signed    KMD/MEDQ  D:  05/10/2006  T:  05/11/2006  Job:  [id number]   cc:   Vesta Mixer, M.D.

## 2010-10-06 NOTE — H&P (Signed)
NAME:  Jeffery, Gibson NO.:  [id number]   MEDICAL RECORD NO.:  [id number]          PATIENT TYPE:  OBV   LOCATION:  1823                         FACILITY:  [facility]   PHYSICIAN:  Ulyses Amor, MD DATE OF BIRTH:  08-04-1928   DATE OF ADMISSION:  04/29/2006  DATE OF DISCHARGE:                              HISTORY & PHYSICAL   Jeffery Gibson is a 77 year old white man who is admitted to [facility] for unstable angina.   The patient has a history of coronary artery disease which dates back to  2001.  At that time he underwent percutaneous coronary intervention of  the right coronary artery.  On March 20, 2006, he under PCI of the  LAD.  Approximately 10 days ago he experienced 3 episodes of exertional  chest pain.  As a result of these episodes, he was scheduled for cardiac  catheterization later this week.  However, he experienced two episodes  of chest pain at rest today.  The first occurred while he was eating  dinner.  He took one nitroglycerin tablet and the chest pain resolved in  approximately 15 minutes.  He experienced a second episode of chest pain  this evening while sedentary.  It did not resolve with three  nitroglycerins.  It prompted his visit to the emergency department.  His  chest pain is largely resolved at this time, approximately 2 hours after  its onset.  The patient reports that the two episodes of chest pain were  similar in quality to his prior cardiac chest pain.  The chest pain is  described as a pressure across his anterior chest.  It radiates down his  left arm.  It is associated with diaphoresis, but no dyspnea or nausea.  There were no exacerbating or ameliorating factors other than as  described above.  The chest pain appeared not to be related to position  or respirations.   The patient has no history of myocardial infarction, congestive heart  failure, or arrhythmia.   He has a history of paroxysmal atrial  fibrillation and is status post  permanent pacemaker placement for sick sinus syndrome.   He also has a history of hypertension, dyslipidemia, and non-insulin  dependent diabetes mellitus.   MEDICATIONS:  Plavix, Cozaar, Glimepiride, dipyridamole, Actos,  nifedipine, digoxin, metoprolol, aspirin, omeprazole, Crestor, Flomax,  amiodarone, Metformin, and Coumadin.  The Coumadin was stopped 4 days  ago in anticipation of the planned cardiac catheterization for later  this week.   ALLERGIES:  LATEX.   PAST SURGICAL HISTORY:  None.   SOCIAL HISTORY:  The patient is a retired Emergency planning/management officer.  He lives with  his wife.  He does not smoke cigarettes.  He does not drink alcohol.   FAMILY HISTORY:  Noncontributory.   REVIEW OF SYSTEMS:  No new problems related to his head, eyes, ears,  nose, mouth, throat, lungs, gastrointestinal system, genitourinary  system, or extremities.  There is no history of neurologic or  psychiatric disorder.  There is no history of fever, chills, or weight  loss.   PHYSICAL EXAMINATION:  VITAL SIGNS:  Blood pressure 163/69, pulse 68 and  regular, respirations 21, temperature 98.6.  GENERAL:  The patient was an elderly white man in no discomfort.  He was  alert, oriented, appropriate, and responsive.  HEENT:  Head, eyes, nose, and mouth were normal.  NECK:  Without thyromegaly or adenopathy.  Carotid pulses were palpable  bilaterally and without bruits.  HEART:  Normal S1 and S2.  There was no S3, S4, murmur, rub, or click.  Cardiac rhythm was regular.  No chest wall tenderness was noted.  LUNGS:  Clear.  ABDOMEN:  Soft and nontender.  There was no mass, hepatosplenomegaly,  bruit, distention, rebound, guarding, or rigidity.  Bowel sounds were  normal.  RECTAL:  GENITOURINARY:  Not performed as they were not pertinent to the  reason for acute care hospitalization.  EXTREMITIES:  Without edema, deviation, or deformity.  Radial and  dorsalis pedal pulses were  palpable bilaterally.  NEUROLOGY:  Brief screening neurologic survey was unremarkable.   The electrocardiogram revealed normal sinus rhythm.  There was a  nonspecific ST-T abnormality.  There were no repolarization  abnormalities specific for ischemia or infarction.  The chest  radiograph, according to the radiologist, demonstrated no evidence of  acute cardiopulmonary disease.  Cardiac markers were pending at the time  of this dictation.  Potassium was 4.3, BUN 22, and creatinine pending.  The remaining laboratory studies were pending at the time of this  dictation.   IMPRESSION:  1. Unstable angina.  2. Coronary artery disease, status post right coronary artery      percutaneous coronary intervention in 2001, status post left      anterior descending percutaneous coronary intervention on March 20, 2006.  3. History of paroxysmal atrial fibrillation; permanent pacemaker for      sick sinus syndrome.  4. Hypertension.  5. Dyslipidemia.  6. Diabetes mellitus.   PLAN:  1. Telemetry.  2. Serial cardiac enzymes.  3. Aspirin.  4. Intravenous heparin (if INR is not therapeutic, as would be      expected given its discontinuation).  5. Intravenous nitroglycerin.  6. Plavix.  7. Further measures per Vesta Mixer, M.D.      Ulyses Amor, MD  Electronically Signed    MSC/MEDQ  D:  04/29/2006  T:  04/30/2006  Job:  [id number]   cc:   Vesta Mixer, M.D.

## 2010-10-06 NOTE — Discharge Summary (Signed)
[place]  Patient:    Jeffery Gibson, Jeffery Gibson                     MRN: [id number] Adm. Date:  [id number] Disc. Date: [id number] Attending:  Corliss Marcus                           Discharge Summary  FINAL DIAGNOSES:  1. Unstable angina.  2. Old myocardial infarction.  3. Functioning cardiac pacemaker.  4. Hypertensive cardiovascular disease.  5. Esophageal reflux.  OPERATION/PROCEDURE: Cardiac catheterization with single vessel percutaneous transluminal coronary angioplasty by Dr. Kristeen Miss on August 30, 1999.  HISTORY OF PRESENT ILLNESS: This patient is a 77 year old gentleman admitted on August 29, 1999 by Dr. Corliss Marcus with new onset angina.  He has known prior coronary disease dating back to 64 when he had a myocardial infarction and had subsequent PTCA.  He presented the night of this admission after three episodes beginning two days prior of anterior substernal aching chest pain which radiated through to the back, both shoulders, and down both arms.  The first episode was associated with minimal exertion and the second episode was postprandial.  Each episode responded to sublingual nitroglycerin x 1.  There was no nausea, diaphoresis, or shortness of breath, and at the time of presentation he was asymptomatic.  This was his first angina since his procedures in 1986.  PAST MEDICAL HISTORY: Syncope followed by permanent pacemaker insertion in 1997.  PHYSICAL EXAMINATION:  GENERAL: Well-developed, well-nourished, oriented and alert, cooperative gentleman in no distress.  CHEST: Clear.  HEART: No S1 and S2 or S3 or S4.  ABDOMEN: Soft, nontender.  No pulsatile mass.  RECTAL: Not performed.  NEUROLOGIC: Unremarkable.  HOSPITAL COURSE: The patient was admitted and started on IV heparin and IV nitroglycerin.  Aspirin was continued.  Serial CK-MBs and troponin enzymes were obtained.  By the following day his enzymes were still  negative but it was felt that the patient should have cardiac catheterization because of his classic symptoms.  He was seen therefore by Dr. Kristeen Miss, who arranged for cardiac catheterization on the day after admission.  He was found to have 99% stenosis of the distal right coronary artery and he underwent successful PTCA of the distal right coronary artery.  Please see the catheterization report for details.  The patient did well status post PTCA with only slight nausea.  He was observed overnight and was able to be discharged home the following morning, to be followed closely on an outpatient basis.  DISCHARGE MEDICATIONS:  1. Continue home medications of:     a. Inderal.     b. Lipitor.     c. Procardia.     d. Protonix.     e. Aspirin.  2. Plavix 75 mg q.d. (new medication).  DISCHARGE DIET: Continue on a low cholesterol diet.  DISCHARGE ACTIVITY: He is to avoid any lifting or straining.  He may walk as tolerated.  FOLLOW-UP: He will keep his regular appointment next week with Dr. Patty Sermons.  ACCESSORY LABORATORY DATA: Hemoglobin on admission was 14, WBC 7300. Electrolytes normal.  Blood sugar was elevated at 190 and 145.  Troponin I was negative x 2.  CK-MB was negative x 3.  DISCHARGE CONDITION: Improved. DD:  10/25/99 TD:  10/30/99 Job: [id number]

## 2010-10-06 NOTE — Op Note (Signed)
NAME:  Jeffery Gibson, ISA NO.:  [id number]   MEDICAL RECORD NO.:  [id number]          PATIENT TYPE:  INP   LOCATION:  2311                         FACILITY:  [facility]   PHYSICIAN:  Sheliah Plane, MD    DATE OF BIRTH:  26-Sep-1928   DATE OF PROCEDURE:  05/06/2006  DATE OF DISCHARGE:                               OPERATIVE REPORT   PREOPERATIVE DIAGNOSIS:  Coronary occlusive disease with unstable  angina.   POSTOPERATIVE DIAGNOSES:  1. Coronary occlusive disease with unstable angina.  2. 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   PROCEDURES PERFORMED:  1. Coronary artery bypass grafting x4 with left internal mammary      artery to the left anterior descending coronary artery, reversed      saphenous vein graft to the diagonal coronary artery, reversed      saphenous vein graft to the circumflex coronary artery, reversed      saphenous vein graft to the distal right coronary artery, with      right thigh and calf endovein harvesting.  2. Repair innominate vein.  3. Ligation of left atrial appendage.   SURGEON:  Sheliah Plane, MD   FIRST ASSISTANT:  Salvatore Decent. Dorris Fetch, MD   SECOND ASSISTANT:  Stephanie Acre. Dominick, PA   BRIEF HISTORY:  The patient is a 77 year old male who has known coronary  occlusive disease and had drug-coated stent placed in his LAD  approximately 6 weeks previously.  Has been maintained on aspirin,  Plavix, Coumadin, and dipyridamole.  He returns with recurrent unstable  angina, undergoes cardiac catheterization which demonstrates significant  3-vessel disease.  Coronary artery bypass grafting was recommend.  The  patient was stabilized on heparin and Integrilin until the Plavix could  be washed out.  Risks and options were discussed with the patient, who  agreed to sign informed consent.   DESCRIPTION OF PROCEDURE:  The patient had Swan-Ganz placed with some  difficulty by anesthesia.  Attempts at the right IJ where the Ernestine Conrad was  unable  to be passed.  In the left IJ, also a Swan could not be passed  after several tries.  Ultimately a Swan-Ganz was placed through the left  subclavian vein.  The patient underwent general endotracheal anesthesia  without incident.  Skin of the chest and legs was prepped with Betadine  and draped in the usual sterile manner.  Using the Guidant endovein  harvesting system, vein was harvested from the right thigh and calf.  Upon opening the chest with a sternal saw, a significant amount of  venous bleeding was encountered.  Pressure was held at that portion of  the incision where the innominate vein was injured.  Bleeding was  controlled.  The patient was volume-resuscitated.  A retractor was  placed in the chest with Dr. Sunday Corn assistance.  The patient was  heparinized and aortic and venous cannulas were placed and volume was  administered.  Clamps were placed on the vein and the vein was repaired  with a running Prolene.  The Swan-Ganz was coiled up in the innominate  vein.  Postulated that the vein was injured  with the coils of the Swan-  Ganz in the vein distorted its anatomy and the vein was then injured  with the sternal saw.  Satisfactory repair was carried out.  Then with  the patient cannulated but not on bypass yet, the left internal mammary  artery was dissected down as a pedicle graft.  The distal artery was  divided and had good, free flow.  It was satisfactory conduit.  The  patient was placed on cardiopulmonary bypass, 2.4 L/min per sq. m.  Sites of anastomosis were dissected out of the epicardium.  The  patient's body temperature was cooled to 30 degrees.  The aortic  crossclamp was applied, 500 mL of cold blood potassium cardioplegia was  administered with rapid diastolic arrest of the heart.  Myocardial  septal temperature was monitored throughout the crossclamp period.  Attention was turned first to the distal right coronary artery, which  was opened and admitted a 1.5  mm probe.  Using a running 7-0 Prolene,  distal anastomosis was performed.  Attention was then turned to the  circumflex coronary artery, which was a large, partially intramyocardial  vessel.  The vessel was opened and admitted a 1.5 mm probe.  Using  running 7-0 Prolene, distal anastomosis was performed.  The diagonal  coronary artery was identified and was opened.  It also was 1.5 mm in  size.  Using running 7-0 Prolene, distal anastomosis was performed.  The  left internal mammary artery was then anastomosed to the mid-left  anterior descending coronary.  This vessel was approximately 1.8 mm in  size.  Using running 8-0 Prolene the left internal mammary artery was  anastomosed to the left anterior descending coronary artery.  With the  aortic crossclamp still in place, three punch aortotomies were  performed.  Each of the vein grafts were anastomosed to the ascending  aorta.  Air was evacuated from the ascending aorta and the grafts and  the aortic crossclamp was removed with a total crossclamp time of 80  minutes.  Sites of anastomosis were inspected and were free of bleeding.  The [place], which had been removed with the difficulty with the innominate  vein, was then re-floated under direct vision, watching the vein and the  progression of the [place] so as not to injure it any further.  The patient  was then ventilated and weaned from cardiopulmonary bypass without  difficulty.  He remained hemodynamically stable and was decannulated in  the usual fashion.  Protamine sulfate was administered with the  operative field hemostatic.  A left and right pleural tube were placed.  Prior to complete closure of the right chest, the apex of the right  chest was also carefully inspected.  There was no evidence of bleeding  from this area or potential injury from the right IJ stick.  The patient  did have a right subclavian pacemaker in place, but at completion of the case this seemed to be undisturbed  and functioning properly.  The  patient was decannulated in the usual fashion.  Protamine sulfate was  administered with the operative hemostatic.  The pleural tubes were  placed.  A single Blake mediastinal drain was placed.  Pericardium was  reapproximated, the sternum closed with #6 stainless steel wire, the  fascia closed with interrupted 0 Vicryl, running 3-0 Vicryl for the  subcutaneous tissue, a 4-0 subcuticular stitch in the skin edges.  Dry  dressings were applied.  Sponge and needle count was reported as correct  at  completion of the procedure.  Total pump time was 134 minutes.   It should also be noted, while the heart was arrested, the left atrial  appendage was doubly ligated because the patient had had a distant  history of atrial fibrillation noted this hospitalization and for  several years he had been in sinus rhythm with the pacemaker in place.      Sheliah Plane, MD  Electronically Signed     EG/MEDQ  D:  05/07/2006  T:  05/07/2006  Job:  [id number]   cc:   Cassell Clement, M.D.  Vesta Mixer, M.D.

## 2010-10-06 NOTE — H&P (Signed)
[facility]  Patient:    SELDON, BARRELL                     MRN: [id number] Adm. Date:  [id number] Attending:  Corliss Marcus CC:         Clovis Pu. Patty Sermons, M.D.                         History and Physical  REASON FOR ADMISSION:  New onset angina.  HISTORY OF PRESENT ILLNESS:  Mr. Skoog is a 77 year old man with a previous  history of ASCVD dating to 71 when he experienced a myocardial infarction and was treated with subsequent PTCA.  He presents tonight after three episodes beginning two days ago of anterior substernal aching chest pain that radiates through to he back, both shoulder, and down both arms.  The first episode was associated with  some minimal exertion.  The second two episodes were postprandial.  Each episode responded to sublingual nitroglycerin taken x 1.  The last episode required longer to resolve than the previous two.  They were not associated with any nausea, diaphoresis, or shortness of breath.  The first two lasted about 15 to 20 minutes, the last one 20 to 25 minutes.  He is currently asymptomatic.  He has not had symptoms since his procedures in 1986.  PAST MEDICAL HISTORY: 1. GERD recently diagnosed.  Symptoms described are as "couldnt breathe."?) 2. ASCVD as described above. 3. Status post ankle fracture during a syncopal episode. 4. Status post PPM for syncope in 1997. 5. Hypertension.  CURRENT MEDICATIONS:  Procardia, Inderal, aspirin, and Protonix 40 mg p.o. q.d.  Doses of Procardia and Inderal are unknown.  DRUG ALLERGIES:  None known.  FAMILY HISTORY:  Father died, age 41, had emphysema and heart disease.  Mother ied in her old age of an unusual "Oriental illness."  SOCIAL HISTORY:  The gentleman denies tobacco or alcohol usage at all.  He drinks three cups of tea and one cup of coffee per day, no sodas.  He is married, accompanied by his one son this evening.  He has two other sons and a  daughter. He has lived here in [place] for 21 years.  He is a retired [employer].  REVIEW OF SYSTEMS:  Noncontributory.  PHYSICAL EXAMINATION:  VITAL SIGNS:  Blood pressure 165/82, pulse 90, respirations 16, temperature 98.6. Room air O2 saturation 95%.  GENERAL:  This is a 77 year old, well-appearing gentleman, alert and oriented, n no acute distress.  HEENT:  Unremarkable.  The pupils are equal, round and reactive to light and accommodation.  Extraocular movements are intact.  Sclerae nonicteric.  Oral mucosa is pink and moist.  The tongue is not coated.  The dentition is in good repair.  NECK:  Supple without thyromegaly or masses.  Carotid upstrokes are normal. There are no bruits, no jugular venous distension.  CHEST:  Clear with good excursion bilaterally.  Normal vesicular breath sounds throughout.  The precordium is quiet.  Normal S1 and S2 is heard.  No S3, S4, murmur, click, or rub noted.  PMI is not palpable.  ABDOMEN:  Protuberant, soft, and nontender.  No hepatosplenomegaly or midline pulsatile mass.  Bowel sounds are present in all quadrants.  GENITALIA:  Normal male phallus, descended testicles without lesions.  RECTAL:  Not performed.  EXTREMITIES:  Full range of motion.  No edema and intact distal pulses.  NEUROLOGIC:  Cranial nerves II-XII intact.  Motor and sensory grossly intact. Farrel Gordon is not tested.  SKIN:  Warm, dry, and clear.  LABORATORY DATA:  Electrocardiogram: Normal sinus rhythm.  Normal EKG.  IMPRESSION:  Unstable angina pectoris, new onset.  PLAN:  Will admit to telemetry.  Continue home medications.  Begin IV heparin as well as low-dose IV nitroglycerin.  Continue aspirin.  Obtain serial CK and troponin enzymes, and repeat ECG.  Further management per Dr. Ronny Flurry. DD:  08/29/99 TD:  08/30/99 Job: [id number]

## 2010-10-06 NOTE — Discharge Summary (Signed)
NAME:  Jeffery Gibson, Jeffery Gibson                        ACCOUNT NO.:  [id number]   MEDICAL RECORD NO.:  [id number]                   PATIENT TYPE:  INP   LOCATION:  3735                                 FACILITY:  [facility]   PHYSICIAN:  Gita Kudo, M.D.              DATE OF BIRTH:  10-19-1928   DATE OF ADMISSION:  12/14/2003  DATE OF DISCHARGE:  12/21/2003                                 DISCHARGE SUMMARY   CHIEF COMPLAINT:  Cancer of the rectosigmoid.   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  Because of a change in  bowel movements and bleeding, colonoscopy showed a rectosigmoid carcinoma.  After a bowel prep, he comes in for surgery.   He is on multiple medications for his cardiac status, and he has been  followed for this by Dr. Ronny Flurry.   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  Twelve negative lymph nodes.  Adherent to but not invasive of the urinary bladder.   EKG showed ventricular pacing, normal rhythm.   Chest x-ray showed decreased aeration initially but improving and followup  films with remaneant cardiomegaly and atelectasis, decrease in effusion.   Initial hemoglobin was 11, hematocrit 31; at discharge hemoglobin 11,  hematocrit 32.  Glucose remained elevated during this hospitalization  ranging approximately 140 to 163.  A series of cardiac markers, CK and CK-MB  were done and negative.  He was O positive, received two units of blood  transfusion during the procedure.   HOSPITAL COURSE:  On the morning of admission, the patient underwent  resection of the rectosigmoid tumor as well as a portion of the bladder.  Postoperatively in the recovery room, he had an episode of hypotension and  hypoperfusion.  He had some difficulty breathing and the dyspnea, tachypnea  resolved, and he was seen in consultation by Dr.  Reyes Ivan and followed by him  and Dr. Patty Sermons during the hospitalization.  After that he continued to do  quite well.  His cardiac status was monitored and treated by Dr. Patty Sermons.  His GI tract regained function.  The Foley catheter remained in place during  his entire hospitalization.  He slowly improved until the point that he was  discharged, on December 21, 2003, his seventh postoperative day.  He was  allowed home on his previous medications, to get a cystogram before I  removed the Foley catheter, analgesics for pain.   DISCHARGE DIAGNOSIS:  Carcinoma of rectosigmoid with adherent bladder.   OPERATIONS:  Resection rectosigmoid carcinoma within continuity partial  cystectomy.   COMPLICATIONS:  Infections:  None.   CONSULTATIONS:  1.  Rosine Abe, M.D.  2.  Cassell Clement, M.D.   CONDITION ON DISCHARGE:  Good.  Gita Kudo, M.D.    MRL/MEDQ  D:  01/21/2004  T:  01/22/2004  Job:  [id number]   cc:   Cassell Clement, M.D.  [address]  [place]  [place] [address]  Fax: [phone]   Bernette Redbird, M.D.  [address] [place]  [place], [place] [address]  Fax: [phone]   Elmore Guise., M.D.  [address]  [place], [place] [address]  Fax: [phone]

## 2010-10-06 NOTE — H&P (Signed)
NAME:  ZYDEN, SUMAN NO.:  [id number]   MEDICAL RECORD NO.:  [id number]            PATIENT TYPE:   LOCATION:                                 FACILITY:   PHYSICIAN:  Elmore Guise., M.D.DATE OF BIRTH:  [birth year]   DATE OF ADMISSION:  03/02/2005  DATE OF DISCHARGE:                                HISTORY & PHYSICAL   REASON FOR ADMISSION:  Generator change.   HISTORY OF PRESENT ILLNESS:  The patient is a very pleasant 77 year old  white male with a past medical history of hypertension, sick sinus syndrome  (status post dual chamber permanent pacemaker implant), dyslipidemia,  gastroesophageal reflux disease, and history of colon cancer, who presents  for evaluation of pacemaker being at elective replacement interval.  The  patient reports a normal state of health, and actually states I'm doing  fine.  He had a pacemaker implant approximately 9 years ago for treatment  of sick sinus syndrome.  This was implanted after a syncopal episode where  he broke his leg.  His pacemaker now shows elective replacement interval.  He reports that he has no significant problems.  He is doing his normal  activities without any difficulty.  He is on antibiotics for prostate  infection; however, will be finishing these in the next couple of days.  He  denies any recent fever, chills, nausea, vomiting or diarrhea.   REVIEW OF SYSTEMS:  As per HPI, or otherwise negative.   CURRENT MEDICATIONS:  1.  Procardia XL 30 mg daily.  2.  Aspirin 81 mg daily.  3.  Dipyridamole 50 mg daily.  4.  Nitroglycerin p.r.n.  5.  Lipitor 10 mg daily.  6.  Actos 45 mg daily.  7.  Metoprolol 25 mg b.i.d.  8.  Digoxin 0.25 mg daily.  9.  Omeprazole 20 mg daily.  10. Vitamin C 500 mg daily.  11. Multivitamin once daily.  12. Amaryl 4 mg daily.  13. Cozaar 100 mg daily.  14. Cipro 500 mg daily.   ALLERGIES:  None.   FAMILY HISTORY:  Positive for COPD and heart disease.   SOCIAL HISTORY:   He is married.  No tobacco or alcohol.  He is a retired  Nurse, adult.   PHYSICAL EXAMINATION:  VITAL SIGNS:  Weight is 216 pounds.  Blood pressure  170/88, heart rate 80 and regular.  GENERAL:  He is a very pleasant elderly white male, alert and oriented x4 in  no acute distress.  HEENT:  Normal.  NECK:  Supple.  No lymphadenopathy.  There were 2+ carotids.  No JVD and no  bruits.  LUNGS:  Clear.  HEART:  Regular with a normal S1 and S2.  Pacemaker site is in the right  deltopectoral groove.  ABDOMEN:  Soft, nontender, nondistended.  EXTREMITIES:  Warm with 2+ pulses.  No significant edema.   His pacemaker was evaluated on February 21, 2005.  He currently has a Museum/gallery curator implanted March 27, 1996.  The battery voltage is 2.34  volts.  His MP and sensing and threshold test showed  an atrial lead  impedence of 350 ohms with P waves measuring 2.0 mv.  The threshold was 1.5  volts at 0.4 ms.  Ventricular lead impedence was 349 ohms.  R waves measured  5.0 mv, and threshold was 1.5 volts at 0.4 ms.  His current atrial and  ventricular lead settings are 3.0 volts at 0.4 ms with the sensitivity at  0.5 mv.  His ventricular lead settings are 3.0 volts and a pulse width of  0.4 ms, and sensitivity of 2.0 mv.   IMPRESSION:  History of sick sinus syndrome with elective replacement  interval on his pacemaker.   PLAN:  The patient will have a CBC, BNP, and coags done today.  He will have  a generator change scheduled for March 02, 2005.  I did go over the risks  and benefits with him at length.  Should his leads show any worsening  threshold or impedence, I discussed potentially placing an additional lead  at that time.  The patient understands the risks including bleeding,  infection, vascular injury, pneumothorax, perforation, and elects to  proceed.      Elmore Guise., M.D.  Electronically Signed     TWK/MEDQ  D:  02/22/2005  T:  02/23/2005  Job:  [id number]

## 2010-10-06 NOTE — Discharge Summary (Signed)
NAME:  Jeffery Gibson Gibson, Jeffery Gibson NO.:  [id number]   MEDICAL RECORD NO.:  [id number]          PATIENT TYPE:  INP   LOCATION:  6525                         FACILITY:  [facility]   PHYSICIAN:  Vesta Mixer, M.D. DATE OF BIRTH:  March 01, 1929   DATE OF ADMISSION:  03/20/2006  DATE OF DISCHARGE:  03/21/2006                                 DISCHARGE SUMMARY   DISCHARGE DIAGNOSES:  1. Coronary artery disease. He is status post PTCA and stenting of his mid      left anterior descending artery. He is status post remote PTCA of his      right coronary artery.  2. Hyperlipidemia.  3. Atrial fibrillation.  4. Hypertension.  5. Diabetes mellitus.   DISCHARGE MEDICATIONS:  1. Aspirin 81 mg a day.  2. Plavix 75 mg a day.  3. Nitroglycerin 0.4 mg sublingually as needed.  4. Crestor 10 mg a day.  5. Amiodarone 100 mg a day.  6. Flomax 0.4 mg a day.  7. Metoprolol 50 mg twice a day.  8. Nifedipine 30 mg a day.  9. Lanoxin 0.125 mg tablets - two tablets a day.  10.Omeprazole 20 mg a day.  11.Amaryl 4 mg twice a day.  12.Actos 45 mg once a day.  13.Cozaar 100 mg once a day.  14.Centrum vitamins once a day.  15.Dipyridamole 50 mg once a day.  16.Coumadin 2.5 mg a day.  17.Glucophage 500 mg a day. He is to start back on Saturday, March 23, 2006.   DISPOSITION:  The patient has been instructed to stop his Lipitor. We are  starting Crestor in its place. He will see Dr. Patty Sermons in 1-2 weeks. He  will need a protime as well as an office visit.   HISTORY OF PRESENT ILLNESS:  [name] is a 77 year old gentleman with a  history of coronary artery disease and hyperlipidemia. He was admitted for  heart catheterization. Please see dictated H&P for further details.   HOSPITAL COURSE PER PROBLEMS:  Problem #1. Coronary artery disease. The  patient underwent heart catheterization. He is found to have a long diffuse  stenosis of his proximal and mid LAD ranging between 40-50%. The  vessel  normalized, and then there was a very tight subtotal occlusion of about 99%.  He underwent successful PTCA and stenting of this lesion. We used a 2.25 mm  mini Vision stent. It was postdilated using a 2.25 x 8 mm Quantum monorail  inflated up to 16 atmospheres. He tolerated the procedure quite well. There  was no disruption of the proximal LAD plaque. The patient tolerated  procedure and did not have any further episodes of chest pain.  Problem #2. Hyperlipidemia. The patient had a fasting lipid profile the  morning of discharge. He was found to have an LDL 95, HDL 33, triglycerides  146, total cholesterol 157. We will stop his Lipitor and start him on  Crestor 10 mg a day. He will follow up with Dr. Patty Sermons for this.  1. Diabetes mellitus. The patient's glucose levels were fairly well-  controlled. We will restart his Glucophage on Saturday. He will follow      up with Dr. Patty Sermons as needed. All of his other medical problems are      stable.           ______________________________  Vesta Mixer, M.D.     PJN/MEDQ  D:  03/21/2006  T:  03/21/2006  Job:  [id number]   cc:   Cassell Clement, M.D.

## 2010-10-06 NOTE — Op Note (Signed)
NAME:  Jeffery Gibson, Jeffery Gibson                        ACCOUNT NO.:  [id number]   MEDICAL RECORD NO.:  [id number]                   PATIENT TYPE:  AMB   LOCATION:  ENDO                                 FACILITY:  [facility]   PHYSICIAN:  Bernette Redbird, M.D.                DATE OF BIRTH:  1928/12/23   DATE OF PROCEDURE:  11/23/2003  DATE OF DISCHARGE:                                 OPERATIVE REPORT   PROCEDURE:  Colonoscopy with polypectomy and biopsies.   INDICATIONS:  A 77 year old gentleman with iron-deficiency anemia and small-  volume hematochezia.   FINDINGS:  A bulky, necrotic tumor near the rectosigmoid junction.  Multiple  polyps.  Pancolonic diverticulosis.  Possible localized resolving ischemia  near the splenic flexure.   PROCEDURE:  The nature, purpose, and risks of the procedure had been  discussed with the patient, who provided written consent.  Sedation was  fentanyl 70 mcg and Versed 7 mg IV without arrhythmias or desaturation.  Digital exam of the prostate was unremarkable.  The Olympus adult video  colonoscope was advanced to the cecum as identified by clear visualization  of the appendiceal orifice and pullback was then performed.   Beginning at about 22 cm and extending proximally about an additional 3-4  cm, there was a bulky, nodular, necrotic, lobulated, semicircumferential,  semiobstructing mass suggestive of a malignant neoplasm.  The adult  colonoscope was able to squeeze through this area without too much  difficulty.  At the conclusion of the procedure, multiple biopsies were  obtained from it.   Elsewhere in the colon, the patient had numerous polyps, including several  small ones in the cecum or ascending colon removed by either cold biopsy or  cold snare, plus multiple diminutive ones in the rectum and a medium-sized  one in the rectum removed by hot snare technique with complete hemostasis  and no evidence of excessive cautery.  That polyp probably measured  about  1.5-2 cm across.   In addition, at about 45 cm from the external anal opening, there was a  short segment, perhaps a couple of centimeters long, where the colonic  mucosa was erythematous and superficially ulcerated in appearance suggestive  of resolving ischemia.  Multiple biopsies were obtained from there as well.   The patient also had substantial pancolonic diverticulosis.   Retroflexion in the rectum was unremarkable apart from the presence of  multiple small polyps.  It is estimated there were probably 15 such sessile  polyps in the rectum.  Many of these were biopsied, but not to the point of  excision, and not all of them were sampled due to their large number.   The patient tolerated this procedure well, and there were no apparent  complications.   IMPRESSION:  1. Mass in the distal sigmoid suggestive of colon cancer.  2. Multiple colon polyps of various sizes removed and/or sampled as     described above,  from the rectum to the cecum.  In all, there were     probably 20-30 such polyps, although most of them were under 5 mm in size     and only a few were greater than a centimeter.  3. Possible resolving ischemia near the splenic flexure.  4. Pancolonic diverticulosis.   PLAN:  Await pathology results with anticipated surgical consultation in the  near future.                                               Bernette Redbird, M.D.    RB/MEDQ  D:  11/23/2003  T:  11/24/2003  Job:  [id number]   cc:   Cassell Clement, M.D.  [address], [address]  [place]  [place] [address]  Fax: [phone]

## 2010-10-06 NOTE — Op Note (Signed)
NAME:  Jeffery Gibson, Jeffery Gibson NO.:  [id number]   MEDICAL RECORD NO.:  [id number]          PATIENT TYPE:  OIB   LOCATION:  2853                         FACILITY:  [facility]   PHYSICIAN:  Cassell Clement, M.D. DATE OF BIRTH:  11-04-1928   DATE OF PROCEDURE:  DATE OF DISCHARGE:                               OPERATIVE REPORT   PROCEDURE:  Direct current cardioversion.   HISTORY:  This is an elderly gentleman with a history of atrial flutter  postop.  He has been on Coumadin for a sufficient period of time and is  now brought in for elective cardioversion.  His preprocedure rhythm is  atrial flutter with variable block.   The patient was given IV Pentothal by Dr. Michelle Piper, after which he was  given a single 75-joule shock using the AP pads and converted promptly  to normal sinus rhythm.  He tolerated the procedure well.  There were no  postprocedural complications.           ______________________________  Cassell Clement, M.D.     TB/MEDQ  D:  07/05/2006  T:  07/06/2006  Job:  [id number]   cc:   Kaylyn Layer. Michelle Piper, M.D.  Sheliah Plane, MD

## 2010-10-06 NOTE — Cardiovascular Report (Signed)
NAME:  Jeffery Gibson NO.:  [id number]   MEDICAL RECORD NO.:  [id number]          PATIENT TYPE:  INP   LOCATION:  6525                         FACILITY:  [facility]   PHYSICIAN:  Jeffery Gibson, M.D. DATE OF BIRTH:  19-Apr-1929   DATE OF PROCEDURE:  03/20/2006  DATE OF DISCHARGE:                              CARDIAC CATHETERIZATION   HISTORY:  [name] is a 77 year old gentleman with a history of  coronary artery disease and mild renal insufficiency.  He was admitted for  heart catheterization after having an abnormal Cardiolite study for symptoms  of chest pain.  Because of his mild renal insufficiency, he received a  bicarb drip protocol before and after the case.   PROCEDURES:  1. Left heart catheterization.  2. Coronary angiography.  3. Percutaneous transluminal cardiac angioplasty and stenting of the mid      left anterior descending artery.   The right femoral artery was easily cannulated using a modified Seldinger  technique.   HEMODYNAMIC RESULTS:  LV pressure was 158/10 with an aortic pressure of  159/63.   ANGIOGRAPHY:  Left main.  The left main has mild luminal irregularities.   The left anterior descending artery has proximal mid diffuse stenosis of  approximately 40-50% along the entire proximal mid region.  There is then a  99% subtotal occlusion at the mid point.  The distal LAD following this  occlusion looks fairly good. There are only a few minor luminal  irregularities.   The first diagonal artery is a large vessel which is fairly normal.  The  second diagonal artery originates in the middle of the lesion and has a 60-  70% stenosis.  This vessel is fairly small.   The left circumflex artery is a large vessel.  There is a 40% stenosis in  the proximal segment.  It gives off a large obtuse marginal artery which is  fairly normal.   The ramus intermediate branch is a large moderate-size vessel with minor  luminal irregularities.   The right coronary artery is large and dominant.  There is a 30% proximal  stenosis.  This was followed by 60-70% eccentric mid stenosis.  There is  mild disease in the distal RCA.  The posterior descending artery is  unremarkable.  The posterolateral branch is fairly normal.   Left ventriculogram was performed using hand injection.  It reveals normal  left ventricular contractility with a fairly normal left ventricular  systolic function.  Ejection fraction by Cardiolite scanning was 65%.   PERCUTANEOUS INTERVENTION:  A 6-French Judkins left 4 guide was placed up  into the left main.  An Asahi soft wire was placed down into the distal left  anterior descending artery.  A 2.0 x 15 mm Voyager balloon was passed down  into the mid LAD and was inflated up to 8 atmospheres for 28 seconds.  This  resulted in improvement of the vessel lumen.  At this point, a 2.25 x 12 mm  mini Vision stent was positioned across the mid LAD stenosis.  It was  deployed at 10 atmospheres for 19 seconds.  This was removed, and post stent dilatation was achieved using a 2.25 x 8 mm  Quantum monorail balloon.  It was inflated in the middle of the stent for 16  atmospheres for 24 seconds.  It was then moved distally and was inflated up  to 12 atmospheres for 25 seconds and then pulled proximally and was inflated  up to 14 atmospheres for 12 seconds.  This resulted in a very nice  angiographic result.  He still has a diffuse 50% stenosis just proximal to  the stent, but overall the flow through this region looks good.  I worry  about doing any other further angioplasty in the proximal LAD as this would  give him a very long segment of stent which would have a high likelihood of  closing.   We will continue with the bicarb protocol.  He will continue with Integrilin  for 18 hours and will receive 300 mg more of Plavix as well as 20 mg of IV  Pepcid.           ______________________________  Jeffery Gibson,  M.D.     PJN/MEDQ  D:  03/20/2006  T:  03/20/2006  Job:  [id number]   cc:   Cassell Clement, M.D.

## 2010-10-17 ENCOUNTER — Ambulatory Visit (INDEPENDENT_AMBULATORY_CARE_PROVIDER_SITE_OTHER): Payer: Medicare Other | Admitting: *Deleted

## 2010-10-17 ENCOUNTER — Telehealth: Payer: Self-pay | Admitting: *Deleted

## 2010-10-17 DIAGNOSIS — I4891 Unspecified atrial fibrillation: Secondary | ICD-10-CM

## 2010-10-17 NOTE — Telephone Encounter (Signed)
Pt was in Coumadin clinic this am.   Spouse stated his right hand would occasionally turn "blue";  States no pian or coldness noted; states this comes and goes and does not last long; On exam this morning with RN, both hands were pink in color and no blueness was noted;  Discussed with Dr. Tiburcio Pea suggested to continue to monitor.  If this becomes worse or last, to call back;  Pts spouse was advised

## 2010-10-19 ENCOUNTER — Encounter (HOSPITAL_BASED_OUTPATIENT_CLINIC_OR_DEPARTMENT_OTHER): Payer: Medicare Other | Admitting: Oncology

## 2010-10-19 ENCOUNTER — Other Ambulatory Visit: Payer: Self-pay | Admitting: Oncology

## 2010-10-19 DIAGNOSIS — C61 Malignant neoplasm of prostate: Secondary | ICD-10-CM

## 2010-10-19 LAB — CBC WITH DIFFERENTIAL/PLATELET
Eosinophils Absolute: 0.2 10*3/uL (ref 0.0–0.5)
LYMPH%: 12.4 % — ABNORMAL LOW (ref 14.0–49.0)
MONO#: 0.7 10*3/uL (ref 0.1–0.9)
NEUT#: 6.9 10*3/uL — ABNORMAL HIGH (ref 1.5–6.5)
Platelets: 161 10*3/uL (ref 140–400)
RBC: 3.59 10*6/uL — ABNORMAL LOW (ref 4.20–5.82)
WBC: 8.9 10*3/uL (ref 4.0–10.3)
nRBC: 0 % (ref 0–0)

## 2010-10-26 ENCOUNTER — Ambulatory Visit (INDEPENDENT_AMBULATORY_CARE_PROVIDER_SITE_OTHER): Payer: Medicare Other | Admitting: *Deleted

## 2010-10-26 ENCOUNTER — Other Ambulatory Visit: Payer: Self-pay | Admitting: Internal Medicine

## 2010-10-26 DIAGNOSIS — I495 Sick sinus syndrome: Secondary | ICD-10-CM

## 2010-10-26 DIAGNOSIS — I4891 Unspecified atrial fibrillation: Secondary | ICD-10-CM

## 2010-10-30 ENCOUNTER — Ambulatory Visit (INDEPENDENT_AMBULATORY_CARE_PROVIDER_SITE_OTHER): Payer: Medicare Other | Admitting: *Deleted

## 2010-10-30 DIAGNOSIS — I4891 Unspecified atrial fibrillation: Secondary | ICD-10-CM

## 2010-11-02 NOTE — Progress Notes (Signed)
Pacer remote check  

## 2010-11-09 ENCOUNTER — Other Ambulatory Visit: Payer: Self-pay | Admitting: Oncology

## 2010-11-09 ENCOUNTER — Encounter: Payer: Self-pay | Admitting: *Deleted

## 2010-11-09 ENCOUNTER — Encounter (HOSPITAL_BASED_OUTPATIENT_CLINIC_OR_DEPARTMENT_OTHER): Payer: Medicare Other | Admitting: Oncology

## 2010-11-09 DIAGNOSIS — C61 Malignant neoplasm of prostate: Secondary | ICD-10-CM

## 2010-11-09 DIAGNOSIS — C189 Malignant neoplasm of colon, unspecified: Secondary | ICD-10-CM

## 2010-11-09 DIAGNOSIS — D638 Anemia in other chronic diseases classified elsewhere: Secondary | ICD-10-CM

## 2010-11-09 LAB — CBC WITH DIFFERENTIAL/PLATELET
Eosinophils Absolute: 0.2 10*3/uL (ref 0.0–0.5)
MONO#: 0.6 10*3/uL (ref 0.1–0.9)
NEUT#: 7 10*3/uL — ABNORMAL HIGH (ref 1.5–6.5)
Platelets: 159 10*3/uL (ref 140–400)
RBC: 3.56 10*6/uL — ABNORMAL LOW (ref 4.20–5.82)
RDW: 14.5 % (ref 11.0–14.6)
WBC: 9 10*3/uL (ref 4.0–10.3)
lymph#: 1.1 10*3/uL (ref 0.9–3.3)
nRBC: 0 % (ref 0–0)

## 2010-11-13 ENCOUNTER — Ambulatory Visit (INDEPENDENT_AMBULATORY_CARE_PROVIDER_SITE_OTHER): Payer: Medicare Other | Admitting: *Deleted

## 2010-11-13 DIAGNOSIS — I4891 Unspecified atrial fibrillation: Secondary | ICD-10-CM

## 2010-11-13 LAB — POCT INR: INR: 3

## 2010-11-27 ENCOUNTER — Other Ambulatory Visit (INDEPENDENT_AMBULATORY_CARE_PROVIDER_SITE_OTHER): Payer: Medicare Other | Admitting: *Deleted

## 2010-11-27 ENCOUNTER — Encounter (INDEPENDENT_AMBULATORY_CARE_PROVIDER_SITE_OTHER): Payer: Medicare Other | Admitting: *Deleted

## 2010-11-27 ENCOUNTER — Emergency Department (HOSPITAL_COMMUNITY)
Admission: EM | Admit: 2010-11-27 | Discharge: 2010-11-28 | Disposition: A | Discharge status: Home / Self Care | Payer: Medicare Other | Attending: Emergency Medicine | Admitting: Emergency Medicine

## 2010-11-27 ENCOUNTER — Telehealth: Payer: Self-pay | Admitting: Cardiology

## 2010-11-27 DIAGNOSIS — R339 Retention of urine, unspecified: Secondary | ICD-10-CM | POA: Insufficient documentation

## 2010-11-27 DIAGNOSIS — Z8546 Personal history of malignant neoplasm of prostate: Secondary | ICD-10-CM | POA: Insufficient documentation

## 2010-11-27 DIAGNOSIS — I498 Other specified cardiac arrhythmias: Secondary | ICD-10-CM | POA: Insufficient documentation

## 2010-11-27 DIAGNOSIS — E119 Type 2 diabetes mellitus without complications: Secondary | ICD-10-CM | POA: Insufficient documentation

## 2010-11-27 DIAGNOSIS — R319 Hematuria, unspecified: Secondary | ICD-10-CM

## 2010-11-27 DIAGNOSIS — I252 Old myocardial infarction: Secondary | ICD-10-CM | POA: Insufficient documentation

## 2010-11-27 DIAGNOSIS — Z950 Presence of cardiac pacemaker: Secondary | ICD-10-CM | POA: Insufficient documentation

## 2010-11-27 DIAGNOSIS — I4891 Unspecified atrial fibrillation: Secondary | ICD-10-CM

## 2010-11-27 DIAGNOSIS — Z7901 Long term (current) use of anticoagulants: Secondary | ICD-10-CM | POA: Insufficient documentation

## 2010-11-27 LAB — POCT INR: INR: 2.5

## 2010-11-27 NOTE — Telephone Encounter (Signed)
Patient is having some bleeding from his bladder.  They have held coumadin today and yesterday.  They have a call into the urologist and are waiting for a call back.  Patient is being treated for prostate cancer.  Patient has coumadin check on Wednesday.  Wife wanted dr. Patty Sermons to know.

## 2010-11-27 NOTE — Telephone Encounter (Signed)
Fyi.  Do you want to check coumadin before Wednesday?  How long do you want him to hold coumadin

## 2010-11-27 NOTE — Telephone Encounter (Signed)
Bleeding started yesterday with the passing of clots.  Urologist called back and suggested urinalysis.  Since urologist in [place], ok to come here and check INR as well.

## 2010-11-27 NOTE — Telephone Encounter (Signed)
Agree with plan 

## 2010-11-28 ENCOUNTER — Telehealth: Payer: Self-pay | Admitting: Cardiology

## 2010-11-28 LAB — URINALYSIS W MICROSCOPIC + REFLEX CULTURE
Bilirubin Urine: NEGATIVE
Casts: NONE SEEN
Glucose, UA: NEGATIVE mg/dL
Ketones, ur: NEGATIVE mg/dL
pH: 5.5 (ref 5.0–8.0)

## 2010-11-28 LAB — URINALYSIS, ROUTINE W REFLEX MICROSCOPIC
Glucose, UA: NEGATIVE mg/dL
Ketones, ur: NEGATIVE mg/dL
Protein, ur: 100 mg/dL — AB

## 2010-11-28 LAB — URINE MICROSCOPIC-ADD ON

## 2010-11-28 NOTE — Telephone Encounter (Signed)
Spoke with wife and patient did go to emergency department last night and home with catheter.  Blood still dark, however no visible blood in urine.  Discussed with Lawson Fiscal and will have him hold coumadin today as well.  INR yesterday 2.5, no coumadin since 7/7.  Wife does have a call in to his urologist and advised to have her call me when she speaks with them to see what there plan is.  Wife will call us back tomorrow.

## 2010-11-28 NOTE — Telephone Encounter (Signed)
Message copied by Burnell Blanks on Tue Nov 28, 2010  3:03 PM ------      Message from: Cassell Clement      Created: Tue Nov 28, 2010  8:39 AM       Urine showed RBCs but no evidence of infection.  It appears that he went to the ER last night.

## 2010-11-28 NOTE — Progress Notes (Signed)
Advised of u/a

## 2010-11-28 NOTE — Telephone Encounter (Signed)
Called in wanting to ask about her husbands continuation of his coumadin and wanted to let Dr. Patty Sermons know that he had to go to the emergency room last evening and they inserted a catheter. She also wanted to know about how to empty the waste bag. Please call back. I have pulled his chart.

## 2010-11-29 ENCOUNTER — Encounter: Payer: Medicare Other | Admitting: *Deleted

## 2010-11-29 ENCOUNTER — Telehealth: Payer: Self-pay | Admitting: *Deleted

## 2010-11-29 ENCOUNTER — Emergency Department (HOSPITAL_COMMUNITY)
Admission: EM | Admit: 2010-11-29 | Discharge: 2010-11-29 | Disposition: A | Discharge status: Home / Self Care | Payer: Medicare Other | Attending: Emergency Medicine | Admitting: Emergency Medicine

## 2010-11-29 DIAGNOSIS — I252 Old myocardial infarction: Secondary | ICD-10-CM | POA: Insufficient documentation

## 2010-11-29 DIAGNOSIS — Z950 Presence of cardiac pacemaker: Secondary | ICD-10-CM | POA: Insufficient documentation

## 2010-11-29 DIAGNOSIS — E119 Type 2 diabetes mellitus without complications: Secondary | ICD-10-CM | POA: Insufficient documentation

## 2010-11-29 DIAGNOSIS — Z8546 Personal history of malignant neoplasm of prostate: Secondary | ICD-10-CM | POA: Insufficient documentation

## 2010-11-29 DIAGNOSIS — I498 Other specified cardiac arrhythmias: Secondary | ICD-10-CM | POA: Insufficient documentation

## 2010-11-29 DIAGNOSIS — R339 Retention of urine, unspecified: Secondary | ICD-10-CM | POA: Insufficient documentation

## 2010-11-29 DIAGNOSIS — Z7901 Long term (current) use of anticoagulants: Secondary | ICD-10-CM | POA: Insufficient documentation

## 2010-11-29 DIAGNOSIS — N390 Urinary tract infection, site not specified: Secondary | ICD-10-CM | POA: Insufficient documentation

## 2010-11-29 LAB — URINALYSIS, ROUTINE W REFLEX MICROSCOPIC
Ketones, ur: NEGATIVE mg/dL
Nitrite: NEGATIVE
Nitrite: NEGATIVE
Protein, ur: 100 mg/dL — AB
Protein, ur: NEGATIVE mg/dL
Urobilinogen, UA: 0.2 mg/dL (ref 0.0–1.0)
Urobilinogen, UA: 0.2 mg/dL (ref 0.0–1.0)
pH: 5 (ref 5.0–8.0)

## 2010-11-29 LAB — URINE CULTURE: Culture: NO GROWTH

## 2010-11-29 LAB — URINE MICROSCOPIC-ADD ON

## 2010-11-29 NOTE — Telephone Encounter (Signed)
Message copied by Burnell Blanks on Wed Nov 29, 2010  2:20 PM ------      Message from: Cassell Clement      Created: Wed Nov 29, 2010 12:44 PM       Does report.  The urine culture grew significant numbers of enterococcus.  Since he has a Foley catheter We will defer antibiotic treatment to his urologist.

## 2010-11-29 NOTE — Progress Notes (Signed)
Advised wife  

## 2010-11-29 NOTE — Telephone Encounter (Signed)
Agree with plan 

## 2010-11-29 NOTE — Telephone Encounter (Signed)
Patient wife called back and in emergency room again last night.  They did give him rx for cipro and has appointment with his urologist in am at 8:30.  Wife states still has some visible blood in urine.  He will continue to hold coumadin tonight and call back after appointment. Patient can tell when he is in afib and wife states he has not been.

## 2010-11-29 NOTE — Progress Notes (Signed)
Advise wife

## 2010-11-29 NOTE — Telephone Encounter (Signed)
Patient at the emergency room last two nights and was given rx for cipro.  Has follow up in am with urologist at [facility].

## 2010-11-30 ENCOUNTER — Ambulatory Visit (INDEPENDENT_AMBULATORY_CARE_PROVIDER_SITE_OTHER): Payer: Medicare Other | Admitting: *Deleted

## 2010-11-30 ENCOUNTER — Telehealth: Payer: Self-pay | Admitting: *Deleted

## 2010-11-30 DIAGNOSIS — I4891 Unspecified atrial fibrillation: Secondary | ICD-10-CM

## 2010-11-30 LAB — URINE CULTURE

## 2010-11-30 NOTE — Telephone Encounter (Signed)
Saw urologist today and they are going to do laser surgery on 8/1/2 and he will need to be off coumadin for 1 wk.  There is no visible blood in urine and urologist did advise to go back on coumadin.  He is going to resume his dose and check INR next week.  He did have a pacer check there and he is in Atrial Flutter.  His coumadin dose is 2.5 mg 6 days a week, advised wife to have him take 5 mg today.  He will keep his catheter in until procedure.  Will he need lovenox?  Any other recommendations?

## 2010-11-30 NOTE — Telephone Encounter (Signed)
The patient will stop his Coumadin one week prior to the surgery and he will need to be on Lovenox bridging on days 3 and days 2 and the morning of day one prior to surgery.restart Coumadin as soon as possible postoperatively.

## 2010-12-01 ENCOUNTER — Telehealth: Payer: Self-pay | Admitting: Cardiology

## 2010-12-01 MED ORDER — ENOXAPARIN SODIUM 100 MG/ML ~~LOC~~ SOLN: 100.0000 mg | Freq: Two times a day (BID) | SUBCUTANEOUS | Status: DC

## 2010-12-01 NOTE — Telephone Encounter (Signed)
Advised wife and called to [facility]

## 2010-12-01 NOTE — Telephone Encounter (Signed)
Left message to call back regarding lovenox

## 2010-12-01 NOTE — Telephone Encounter (Signed)
Advised of lovenox.

## 2010-12-01 NOTE — Telephone Encounter (Signed)
The patient weighs approximately 116 kg so we will use 100 mg Lovenox twice a day

## 2010-12-01 NOTE — Telephone Encounter (Signed)
Wanted Last Echo, Ekg, Ov, Labs showing creatine level if possible

## 2010-12-01 NOTE — Telephone Encounter (Signed)
Addended by: Regis Bill B on: 12/01/2010 04:56 PM   Modules accepted: Orders

## 2010-12-01 NOTE — Telephone Encounter (Signed)
What dose of lovenox?

## 2010-12-02 LAB — URINE CULTURE

## 2010-12-04 ENCOUNTER — Telehealth: Payer: Self-pay | Admitting: Cardiology

## 2010-12-04 NOTE — Telephone Encounter (Signed)
Patient's wife wants to know what pt should do regarding low blood sugar. She has a question about a particular prescription and wanted to ask RN

## 2010-12-04 NOTE — Telephone Encounter (Signed)
Agree with plan and dosage of Lovenox

## 2010-12-04 NOTE — Telephone Encounter (Signed)
Hold glimepiride if blood sugar is 100 or less

## 2010-12-04 NOTE — Telephone Encounter (Signed)
Blood sugar Saturday 99, Sunday 90, and this am 84. Did not give him his glimiperide this am.  He feels weak.  At what number should she hold?  Should she give it to him at lunch?  Would like some #'s for guidelines.

## 2010-12-04 NOTE — Telephone Encounter (Signed)
Advised patients wife to hold glimepiride for blood sugar less than 100

## 2010-12-06 ENCOUNTER — Ambulatory Visit (INDEPENDENT_AMBULATORY_CARE_PROVIDER_SITE_OTHER): Payer: Medicare Other | Admitting: *Deleted

## 2010-12-06 DIAGNOSIS — I4891 Unspecified atrial fibrillation: Secondary | ICD-10-CM

## 2010-12-06 LAB — POCT INR: INR: 2.3

## 2010-12-11 ENCOUNTER — Encounter (HOSPITAL_BASED_OUTPATIENT_CLINIC_OR_DEPARTMENT_OTHER): Payer: Medicare Other | Admitting: Oncology

## 2010-12-11 ENCOUNTER — Other Ambulatory Visit: Payer: Self-pay | Admitting: Oncology

## 2010-12-11 DIAGNOSIS — C61 Malignant neoplasm of prostate: Secondary | ICD-10-CM

## 2010-12-11 DIAGNOSIS — C189 Malignant neoplasm of colon, unspecified: Secondary | ICD-10-CM

## 2010-12-11 DIAGNOSIS — D649 Anemia, unspecified: Secondary | ICD-10-CM

## 2010-12-11 DIAGNOSIS — D638 Anemia in other chronic diseases classified elsewhere: Secondary | ICD-10-CM

## 2010-12-11 LAB — CBC WITH DIFFERENTIAL/PLATELET
Basophils Absolute: 0.1 10*3/uL (ref 0.0–0.1)
Eosinophils Absolute: 0.2 10*3/uL (ref 0.0–0.5)
HGB: 10.8 g/dL — ABNORMAL LOW (ref 13.0–17.1)
MCV: 91.9 fL (ref 79.3–98.0)
NEUT#: 6.5 10*3/uL (ref 1.5–6.5)
RDW: 14.2 % (ref 11.0–14.6)
lymph#: 0.9 10*3/uL (ref 0.9–3.3)

## 2010-12-11 LAB — COMPREHENSIVE METABOLIC PANEL
Albumin: 3.8 g/dL (ref 3.5–5.2)
BUN: 50 mg/dL — ABNORMAL HIGH (ref 6–23)
Calcium: 8.9 mg/dL (ref 8.4–10.5)
Chloride: 102 mEq/L (ref 96–112)
Glucose, Bld: 152 mg/dL — ABNORMAL HIGH (ref 70–99)
Potassium: 4.1 mEq/L (ref 3.5–5.3)

## 2010-12-11 LAB — IRON AND TIBC
Iron: 54 ug/dL (ref 42–165)
UIBC: 236 ug/dL

## 2010-12-12 ENCOUNTER — Telehealth: Payer: Self-pay | Admitting: Cardiology

## 2010-12-12 NOTE — Telephone Encounter (Signed)
Discussed with Lawson Fiscal and he is to continue to hold Glimepiride if blood sugar less than 100.  Finish last 3 doxycycline, call back and will send for stool cultures if diarrhea continues.  Advised wife, verbalized understanding

## 2010-12-12 NOTE — Telephone Encounter (Signed)
Blood sugars yesterday am 80, 11:30 am 98, and 6:00 pm 138.  No Glimpirde yesterday.  This am blood sugar was only 79.  Resting now because he was up las night with diarrhea, on antibiotic.  Doxycycline 100 mg x 7 days given last week and has 3 more pills.  Just finished an antibiotic prior to starting Doxycycline for UTI, however wrong antibiotic given and had to change.  Patient very concerned about c-diff per wife.  Should he take the last three Doxycycline?

## 2010-12-12 NOTE — Telephone Encounter (Signed)
Pt's wife called said that Calder's bs has went down to 29 she says she takes 2 to 3 times a day She is concerned please call

## 2010-12-17 NOTE — Telephone Encounter (Signed)
Agree with advice to patient.

## 2010-12-25 ENCOUNTER — Ambulatory Visit (INDEPENDENT_AMBULATORY_CARE_PROVIDER_SITE_OTHER): Payer: Medicare Other | Admitting: *Deleted

## 2010-12-25 ENCOUNTER — Encounter: Payer: Self-pay | Admitting: Cardiology

## 2010-12-25 ENCOUNTER — Ambulatory Visit (INDEPENDENT_AMBULATORY_CARE_PROVIDER_SITE_OTHER): Payer: Medicare Other | Admitting: Cardiology

## 2010-12-25 ENCOUNTER — Ambulatory Visit: Payer: Medicare Other | Admitting: Cardiology

## 2010-12-25 VITALS — BP 130/76 | HR 70 | Wt 262.0 lb

## 2010-12-25 DIAGNOSIS — I4891 Unspecified atrial fibrillation: Secondary | ICD-10-CM

## 2010-12-25 DIAGNOSIS — D126 Benign neoplasm of colon, unspecified: Secondary | ICD-10-CM

## 2010-12-25 DIAGNOSIS — E785 Hyperlipidemia, unspecified: Secondary | ICD-10-CM

## 2010-12-25 DIAGNOSIS — E119 Type 2 diabetes mellitus without complications: Secondary | ICD-10-CM

## 2010-12-25 DIAGNOSIS — E7800 Pure hypercholesterolemia, unspecified: Secondary | ICD-10-CM

## 2010-12-25 DIAGNOSIS — I10 Essential (primary) hypertension: Secondary | ICD-10-CM

## 2010-12-25 LAB — LIPID PANEL
LDL Cholesterol: 28 mg/dL (ref 0–99)
Total CHOL/HDL Ratio: 2

## 2010-12-25 LAB — BASIC METABOLIC PANEL
BUN: 29 mg/dL — ABNORMAL HIGH (ref 6–23)
CO2: 23 mEq/L (ref 19–32)
Chloride: 100 mEq/L (ref 96–112)
Creatinine, Ser: 2.2 mg/dL — ABNORMAL HIGH (ref 0.4–1.5)

## 2010-12-25 LAB — HEPATIC FUNCTION PANEL
Albumin: 3.4 g/dL — ABNORMAL LOW (ref 3.5–5.2)
Alkaline Phosphatase: 61 U/L (ref 39–117)
Bilirubin, Direct: 0 mg/dL (ref 0.0–0.3)

## 2010-12-25 LAB — POCT INR: INR: 4.9

## 2010-12-25 NOTE — Assessment & Plan Note (Signed)
The patient has a past history of diabetes mellitus.  He has not been experiencing any hypoglycemia.  Blood work today is pending.

## 2010-12-25 NOTE — Assessment & Plan Note (Signed)
The patient remains on low dose amiodarone.  He is on long-term Coumadin.  He has not been experiencing any recurrent atrial fibrillation or tachycardia.

## 2010-12-25 NOTE — Assessment & Plan Note (Signed)
The patient has had recent problems with increasing dyspnea and increasing weight.  He's also been experiencing increasing peripheral edema.  He is not presently on any diuretic and we are going to start him on furosemide 20 mg daily.

## 2010-12-25 NOTE — Progress Notes (Signed)
Jeffery Gibson Date of Birth:  1928-12-02 [facility] [address]   [address] [place], [place]  [address] [phone]           Fax   [phone]  History of Present Illness: This pleasant 77 year old gentleman is seen for a scheduled followup office visit.  He has a very complex past medical history.  He has a history of prostate cancer.  He recently underwent surgery 5 days ago at [facility] and has an indwelling Foley catheter with a leg bag which is scheduled to be removed this evening.  Patient has a history of ischemic heart disease.  He is status post CABG.  He has a history of paroxysmal atrial fibrillation.  He has a dual-chamber pacemaker for sick sinus syndrome and tachybradycardia syndrome.  He has a Medtronic pacemaker which was initially implanted in 2007.  He's had diabetes.  He has continued to have problems with weight gain despite being off his Actos and his metformin.  The patient has not been expressing any recurrent angina pectoris.  He has been having problems with fluid retention orthopnea and peripheral edema consistent with some congestive failure.  In addition to his prostate cancer he has a history of stage II adenocarcinoma of the colon diagnosed in July 2005 and has had no clinical evidence of recurrent disease.  He's had a history of renal insufficiency and has anemia of chronic disease and is on Aranesp injections.  He's had dyslipidemia.  He is on chronic Coumadin because of his paroxysmal atrial fibrillation.  Current Outpatient Prescriptions  Medication Sig Dispense Refill  . amiodarone (PACERONE) 100 MG tablet Take 200 mg by mouth daily. 1/2 tablet       . Ascorbic Acid (VITAMIN C PO) Take 1 tablet by mouth daily.        Marland Kitchen aspirin 81 MG tablet Take 81 mg by mouth daily.        . Cholecalciferol (VITAMIN D) 2000 UNITS tablet Take 2,000 Units by mouth daily.        . Darbepoetin Alfa-Albumin (ARANESP IJ) Inject as directed.         Marland Kitchen FINASTERIDE PO Take 5 mg by mouth daily.        Marland Kitchen glimepiride (AMARYL) 4 MG tablet Take 4 mg by mouth daily before breakfast. 1mg  daily      . hydrochlorothiazide 25 MG tablet Take 25 mg by mouth daily. Taking 1/2 daily      . HYDROCODONE-ACETAMINOPHEN PO Take by mouth. As directed       . ibuprofen (ADVIL,MOTRIN) 600 MG tablet 1 daily with food as needed  90 tablet  0  . metoprolol (LOPRESSOR) 50 MG tablet Take 1 tablet (50 mg total) by mouth 2 (two) times daily.  180 tablet  3  . Multiple Vitamin (MULTIVITAMIN) tablet Take 1 tablet by mouth daily.        . nitroGLYCERIN (NITROSTAT) 0.4 MG SL tablet Place 1 tablet (0.4 mg total) under the tongue every 5 (five) minutes as needed for chest pain.  100 tablet  3  . omeprazole (PRILOSEC) 20 MG capsule Take 20 mg by mouth at bedtime.        . rosuvastatin (CRESTOR) 10 MG tablet Take 10 mg by mouth at bedtime. 1/2 tablet         . telmisartan (MICARDIS) 80 MG tablet Take 80 mg by mouth daily.        . ursodiol (ACTIGALL) 300 MG  capsule Take 300 mg by mouth 2 (two) times daily.        Marland Kitchen warfarin (COUMADIN) 5 MG tablet Take 5 mg by mouth. Take as directed by the coumadin clinic         Allergies  Allergen Reactions  . Altace Cough  . Bextra (Valdecoxib) Diarrhea  . Latex   . Metformin And Related Nausea Only  . Mevacor (Lovastatin) Nausea And Vomiting    Patient Active Problem List  Diagnoses  . PROSTATE CANCER  . ADENOMATOUS COLONIC POLYP  . DM  . HYPERLIPIDEMIA  . HYPERTENSION  . MYOCARDIAL INFARCTION  . CAD  . Atrial fibrillation  . BRADYCARDIA-TACHYCARDIA SYNDROME  . SUPRAVENTRICULAR TACHYCARDIA  . RENAL INSUFFICIENCY  . SYNCOPE    History  Smoking status  . Former Smoker  . Types: Cigarettes  . Quit date: 05/22/1963  Smokeless tobacco  . Not on file    History  Alcohol Use No    Family History  Problem Relation Age of Onset  . Heart failure Father 8  . COPD    . Coronary artery disease    . Gallbladder  disease Father   . Emphysema      Review of Systems: Constitutional: no fever chills diaphoresis or fatigue or change in weight.  Head and neck: no hearing loss, no epistaxis, no photophobia or visual disturbance. Respiratory: No cough, shortness of breath or wheezing. Cardiovascular: No chest pain peripheral edema, palpitations. Gastrointestinal: No abdominal distention, no abdominal pain, no change in bowel habits hematochezia or melena. Genitourinary: No dysuria, no frequency, no urgency, no nocturia. Musculoskeletal:No arthralgias, no back pain, no gait disturbance or myalgias. Neurological: No dizziness, no headaches, no numbness, no seizures, no syncope, no weakness, no tremors. Hematologic: No lymphadenopathy, no easy bruising. Psychiatric: No confusion, no hallucinations, no sleep disturbance.    Physical Exam: Filed Vitals:   12/25/10 0855  BP: 130/76  Pulse: 70  The general appearance reveals a well-developed well-nourished obese gentleman in no acute distress.Pupils equal and reactive.   Extraocular Movements are full.  There is no scleral icterus.  The mouth and pharynx are normal.  The neck is supple.  The carotids reveal no bruits.  The jugular venous pressure is normal.  The thyroid is not enlarged.  There is no lymphadenopathy.  The chest is clear to percussion and auscultation. There are no rales or rhonchi. Expansion of the chest is symmetrical.  The precordium is quiet.  The first heart sound is normal.  The second heart sound is physiologically split.  There is no murmur gallop rub or click.  There is no abnormal lift or heave.  The abdomen is soft and nontender. Bowel sounds are normal. The liver and spleen are not enlarged. There Are no abdominal masses. There are no bruits.    Extremities reveal 1+ pretibial and pedal edema.Strength is normal and symmetrical in all extremities.  There is no lateralizing weakness.  There are no sensory  deficits.     Assessment / Plan: For his dyspnea and weight gain and peripheral edema we are adding furosemide 20 mg daily.  He will continue same diabetic medication.  He is on glimepiride 1 mg daily but he holds it is his blood sugar in the morning is less than 100.  He is on Coumadin and his INR today is excessive but he has recently been on a sulfa drug for his kidneys and bladder.  The last dose of that will be tomorrow.  We made  adjustments downward and his Coumadin dose.  We are checking lab work today.  He'll continue other medicines the same and be rechecked in 3 months for followup office visit and fasting lab work

## 2010-12-25 NOTE — Assessment & Plan Note (Signed)
The patient has not been experiencing any hematochezia or melena.  He does complain of constipation.  He has tried generic Colace with no effect and so we recommended that he get some MiraLax 17 g once a day in large glass of water.  If his constipation persists he would need to see his gastroenterologist since he does have a past history of colon cancer problems in 2005.

## 2010-12-27 ENCOUNTER — Telehealth: Payer: Self-pay | Admitting: *Deleted

## 2010-12-27 NOTE — Telephone Encounter (Signed)
Advise wife of labs and to decrease Crestor.  Verbalized understanding.  Patient is having nausea and this is one of the symptoms urology had told them to be looking for post procedure.  Has had some constipation, but did advise wife to contact urology.

## 2010-12-27 NOTE — Progress Notes (Signed)
Advised wife of labs 

## 2010-12-27 NOTE — Telephone Encounter (Signed)
Message copied by Burnell Blanks on Wed Dec 27, 2010  9:37 AM ------      Message from: Cassell Clement      Created: Tue Dec 26, 2010  1:35 PM       Please report.  The cholesterol is very low now.  The Crestor could be contributing to his weakness.        Decrease Crestor from 1/2 at HS to just 1/2 HS on Mon Wed FRi only.  LFTs are okay.  BS is better 134. Kidney function is stable.  Creatinine down to 2.2 better.  Continue other meds the same.  Drink plenty of water.

## 2010-12-28 ENCOUNTER — Other Ambulatory Visit: Payer: Self-pay | Admitting: *Deleted

## 2010-12-28 DIAGNOSIS — Z79899 Other long term (current) drug therapy: Secondary | ICD-10-CM

## 2011-01-08 ENCOUNTER — Ambulatory Visit (INDEPENDENT_AMBULATORY_CARE_PROVIDER_SITE_OTHER): Payer: Medicare Other | Admitting: *Deleted

## 2011-01-08 DIAGNOSIS — I4891 Unspecified atrial fibrillation: Secondary | ICD-10-CM

## 2011-01-08 LAB — POCT INR: INR: 1.4

## 2011-01-12 ENCOUNTER — Other Ambulatory Visit: Payer: Self-pay | Admitting: Oncology

## 2011-01-12 ENCOUNTER — Encounter (HOSPITAL_BASED_OUTPATIENT_CLINIC_OR_DEPARTMENT_OTHER): Payer: Medicare Other | Admitting: Oncology

## 2011-01-12 DIAGNOSIS — D638 Anemia in other chronic diseases classified elsewhere: Secondary | ICD-10-CM

## 2011-01-12 DIAGNOSIS — C189 Malignant neoplasm of colon, unspecified: Secondary | ICD-10-CM

## 2011-01-12 DIAGNOSIS — D649 Anemia, unspecified: Secondary | ICD-10-CM

## 2011-01-12 LAB — CBC WITH DIFFERENTIAL/PLATELET
Basophils Absolute: 0.1 10*3/uL (ref 0.0–0.1)
EOS%: 1.9 % (ref 0.0–7.0)
Eosinophils Absolute: 0.2 10*3/uL (ref 0.0–0.5)
HCT: 32.2 % — ABNORMAL LOW (ref 38.4–49.9)
HGB: 10.6 g/dL — ABNORMAL LOW (ref 13.0–17.1)
MCH: 29.6 pg (ref 27.2–33.4)
MONO#: 0.7 10*3/uL (ref 0.1–0.9)
NEUT#: 8.1 10*3/uL — ABNORMAL HIGH (ref 1.5–6.5)
NEUT%: 80.5 % — ABNORMAL HIGH (ref 39.0–75.0)
lymph#: 1.1 10*3/uL (ref 0.9–3.3)

## 2011-01-17 ENCOUNTER — Encounter (HOSPITAL_BASED_OUTPATIENT_CLINIC_OR_DEPARTMENT_OTHER): Payer: Medicare Other | Admitting: Oncology

## 2011-01-17 DIAGNOSIS — D638 Anemia in other chronic diseases classified elsewhere: Secondary | ICD-10-CM

## 2011-01-17 DIAGNOSIS — D649 Anemia, unspecified: Secondary | ICD-10-CM

## 2011-01-18 ENCOUNTER — Telehealth: Payer: Self-pay | Admitting: Cardiology

## 2011-01-18 DIAGNOSIS — R05 Cough: Secondary | ICD-10-CM

## 2011-01-18 NOTE — Telephone Encounter (Signed)
We will try Tussionex 5 cc q. 12 H. P.r.n.

## 2011-01-18 NOTE — Telephone Encounter (Signed)
Called because her husband has a constant dry cough and she was wondering what she could pick up for him to take. He has already tried Mucinex and Guisenessin but nothing has worked. Please call back.

## 2011-01-18 NOTE — Telephone Encounter (Signed)
Advised wife  

## 2011-01-18 NOTE — Telephone Encounter (Signed)
Please advise 

## 2011-01-23 ENCOUNTER — Ambulatory Visit (INDEPENDENT_AMBULATORY_CARE_PROVIDER_SITE_OTHER): Payer: Medicare Other | Admitting: *Deleted

## 2011-01-23 DIAGNOSIS — I4891 Unspecified atrial fibrillation: Secondary | ICD-10-CM

## 2011-02-01 ENCOUNTER — Encounter: Payer: Self-pay | Admitting: Internal Medicine

## 2011-02-01 ENCOUNTER — Other Ambulatory Visit: Payer: Self-pay

## 2011-02-01 ENCOUNTER — Ambulatory Visit (INDEPENDENT_AMBULATORY_CARE_PROVIDER_SITE_OTHER): Payer: Medicare Other | Admitting: *Deleted

## 2011-02-01 DIAGNOSIS — I495 Sick sinus syndrome: Secondary | ICD-10-CM

## 2011-02-04 LAB — REMOTE PACEMAKER DEVICE
BAMS-0001: 175 {beats}/min
BATTERY VOLTAGE: 2.75 V
RV LEAD AMPLITUDE: 16 mv

## 2011-02-06 ENCOUNTER — Ambulatory Visit (INDEPENDENT_AMBULATORY_CARE_PROVIDER_SITE_OTHER): Payer: Medicare Other | Admitting: *Deleted

## 2011-02-06 DIAGNOSIS — I4891 Unspecified atrial fibrillation: Secondary | ICD-10-CM

## 2011-02-07 LAB — POCT INR: INR: 3.9

## 2011-02-08 ENCOUNTER — Encounter (HOSPITAL_BASED_OUTPATIENT_CLINIC_OR_DEPARTMENT_OTHER): Payer: Medicare Other | Admitting: Oncology

## 2011-02-08 ENCOUNTER — Other Ambulatory Visit: Payer: Self-pay | Admitting: Oncology

## 2011-02-08 DIAGNOSIS — C61 Malignant neoplasm of prostate: Secondary | ICD-10-CM

## 2011-02-08 LAB — CBC WITH DIFFERENTIAL/PLATELET
BASO%: 0.1 % (ref 0.0–2.0)
EOS%: 1.7 % (ref 0.0–7.0)
LYMPH%: 7.8 % — ABNORMAL LOW (ref 14.0–49.0)
MCH: 31.8 pg (ref 27.2–33.4)
MCHC: 34.5 g/dL (ref 32.0–36.0)
MONO#: 0.4 10*3/uL (ref 0.1–0.9)
NEUT%: 85.5 % — ABNORMAL HIGH (ref 39.0–75.0)
Platelets: 155 10*3/uL (ref 140–400)
RBC: 3.82 10*6/uL — ABNORMAL LOW (ref 4.20–5.82)
WBC: 8.3 10*3/uL (ref 4.0–10.3)
lymph#: 0.6 10*3/uL — ABNORMAL LOW (ref 0.9–3.3)

## 2011-02-13 ENCOUNTER — Encounter: Payer: Self-pay | Admitting: *Deleted

## 2011-02-16 NOTE — Progress Notes (Signed)
Pacer checked by remote 

## 2011-02-19 LAB — DIFFERENTIAL
Basophils Absolute: 0
Eosinophils Relative: 2
Lymphocytes Relative: 8 — ABNORMAL LOW
Lymphs Abs: 0.7
Neutro Abs: 7.4

## 2011-02-19 LAB — CARDIAC PANEL(CRET KIN+CKTOT+MB+TROPI)
CK, MB: 1.2
Relative Index: INVALID
Total CK: 41
Troponin I: 0.05

## 2011-02-19 LAB — BASIC METABOLIC PANEL
Chloride: 104
GFR calc Af Amer: 44 — ABNORMAL LOW
GFR calc non Af Amer: 37 — ABNORMAL LOW
Potassium: 3.8
Sodium: 137

## 2011-02-19 LAB — URINALYSIS, ROUTINE W REFLEX MICROSCOPIC
Ketones, ur: NEGATIVE
Nitrite: NEGATIVE
Protein, ur: 30 — AB
Urobilinogen, UA: 0.2
pH: 5.5

## 2011-02-19 LAB — PROTIME-INR
INR: 2.8 — ABNORMAL HIGH
Prothrombin Time: 33.5 — ABNORMAL HIGH

## 2011-02-19 LAB — POCT I-STAT, CHEM 8
BUN: 42 — ABNORMAL HIGH
Chloride: 104
Creatinine, Ser: 1.9 — ABNORMAL HIGH
Potassium: 4.7
Sodium: 136

## 2011-02-19 LAB — GLUCOSE, CAPILLARY: Glucose-Capillary: 117 — ABNORMAL HIGH

## 2011-02-19 LAB — URINE MICROSCOPIC-ADD ON

## 2011-02-19 LAB — POCT CARDIAC MARKERS
CKMB, poc: 1.1
Troponin i, poc: <0.05

## 2011-02-19 LAB — CBC
HCT: 30.4 — ABNORMAL LOW
Platelets: 151
Platelets: 158
RDW: 14.3
RDW: 14.6
WBC: 6.4
WBC: 8.9

## 2011-02-28 ENCOUNTER — Ambulatory Visit (INDEPENDENT_AMBULATORY_CARE_PROVIDER_SITE_OTHER): Payer: Medicare Other | Admitting: *Deleted

## 2011-02-28 DIAGNOSIS — I4891 Unspecified atrial fibrillation: Secondary | ICD-10-CM

## 2011-03-01 ENCOUNTER — Telehealth: Payer: Self-pay | Admitting: Cardiology

## 2011-03-01 LAB — FERRITIN: Ferritin: 253 (ref 22–322)

## 2011-03-01 LAB — CBC
HCT: 28.7 — ABNORMAL LOW
HCT: 29.6 — ABNORMAL LOW
HCT: 31 — ABNORMAL LOW
Hemoglobin: 10.1 — ABNORMAL LOW
Hemoglobin: 10.6 — ABNORMAL LOW
MCV: 90.1
MCV: 91.6
Platelets: 115 — ABNORMAL LOW
Platelets: 122 — ABNORMAL LOW
Platelets: 132 — ABNORMAL LOW
RBC: 3.28 — ABNORMAL LOW
RDW: 14.2 — ABNORMAL HIGH
RDW: 14.5 — ABNORMAL HIGH
WBC: 6.1
WBC: 6.9

## 2011-03-01 LAB — COMPREHENSIVE METABOLIC PANEL
ALT: 230 — ABNORMAL HIGH
Albumin: 3 — ABNORMAL LOW
Albumin: 3.3 — ABNORMAL LOW
Albumin: 3.5
Alkaline Phosphatase: 89
Alkaline Phosphatase: 98
BUN: 21
BUN: 21
BUN: 23
CO2: 28
Chloride: 100
Chloride: 101
Chloride: 98
Creatinine, Ser: 1.49
Creatinine, Ser: 1.49
GFR calc non Af Amer: 46 — ABNORMAL LOW
Glucose, Bld: 162 — ABNORMAL HIGH
Potassium: 3.3 — ABNORMAL LOW
Potassium: 3.7
Sodium: 132 — ABNORMAL LOW
Total Bilirubin: 1.4 — ABNORMAL HIGH
Total Bilirubin: 1.5 — ABNORMAL HIGH
Total Bilirubin: 1.9 — ABNORMAL HIGH
Total Protein: 6.1
Total Protein: 6.5

## 2011-03-01 LAB — DIFFERENTIAL
Basophils Absolute: 0
Basophils Absolute: 0
Basophils Relative: 0
Basophils Relative: 0
Eosinophils Absolute: 0
Eosinophils Absolute: 0
Eosinophils Relative: 0
Monocytes Absolute: 0.6
Monocytes Absolute: 0.6
Monocytes Relative: 9
Monocytes Relative: 9
Neutro Abs: 6
Neutrophils Relative %: 87 — ABNORMAL HIGH

## 2011-03-01 LAB — PROTIME-INR
INR: 1.6 — ABNORMAL HIGH
Prothrombin Time: 19.4 — ABNORMAL HIGH
Prothrombin Time: 25.1 — ABNORMAL HIGH

## 2011-03-01 LAB — AMYLASE: Amylase: 53

## 2011-03-01 LAB — CULTURE, BLOOD (ROUTINE X 2)

## 2011-03-01 LAB — URINE CULTURE

## 2011-03-01 LAB — HEPATITIS PANEL, ACUTE
Hep A IgM: NEGATIVE
Hep B C IgM: NEGATIVE
Hepatitis B Surface Ag: NEGATIVE

## 2011-03-01 LAB — URINE MICROSCOPIC-ADD ON

## 2011-03-01 LAB — IRON AND TIBC
Saturation Ratios: 5 — ABNORMAL LOW
TIBC: 262
UIBC: 248

## 2011-03-01 LAB — FOLATE: Folate: >20

## 2011-03-01 LAB — URINALYSIS, ROUTINE W REFLEX MICROSCOPIC
Bilirubin Urine: NEGATIVE
Hgb urine dipstick: NEGATIVE
Specific Gravity, Urine: 1.022
Urobilinogen, UA: 1
pH: 5.5

## 2011-03-01 LAB — TYPE AND SCREEN

## 2011-03-01 NOTE — Telephone Encounter (Signed)
Wife called and wants to know when he received his Pneumonia vaccine and has a question about the flu shot.  Please call them back.

## 2011-03-02 NOTE — Telephone Encounter (Signed)
Spoke with patient wife and discussed pneumonia vaccine.  [facility] with [facility] on when he last received it

## 2011-03-06 MED ORDER — HYDROCOD POLST-CHLORPHEN POLST 10-8 MG/5ML PO LQCR: 5.0000 mL | Freq: Two times a day (BID) | ORAL | Status: DC | PRN

## 2011-03-07 ENCOUNTER — Ambulatory Visit (INDEPENDENT_AMBULATORY_CARE_PROVIDER_SITE_OTHER): Payer: Medicare Other | Admitting: *Deleted

## 2011-03-07 DIAGNOSIS — I4891 Unspecified atrial fibrillation: Secondary | ICD-10-CM

## 2011-03-07 LAB — POCT INR: INR: 2.6

## 2011-03-08 ENCOUNTER — Encounter (HOSPITAL_BASED_OUTPATIENT_CLINIC_OR_DEPARTMENT_OTHER): Payer: Medicare Other | Admitting: Oncology

## 2011-03-08 ENCOUNTER — Other Ambulatory Visit: Payer: Self-pay | Admitting: Oncology

## 2011-03-08 DIAGNOSIS — C61 Malignant neoplasm of prostate: Secondary | ICD-10-CM

## 2011-03-08 LAB — CBC WITH DIFFERENTIAL/PLATELET
BASO%: 0.6 % (ref 0.0–2.0)
Basophils Absolute: 0.1 10*3/uL (ref 0.0–0.1)
EOS%: 2.2 % (ref 0.0–7.0)
HGB: 11.6 g/dL — ABNORMAL LOW (ref 13.0–17.1)
MCH: 30.9 pg (ref 27.2–33.4)
RBC: 3.76 10*6/uL — ABNORMAL LOW (ref 4.20–5.82)
RDW: 16.1 % — ABNORMAL HIGH (ref 11.0–14.6)
lymph#: 0.9 10*3/uL (ref 0.9–3.3)
nRBC: 0 % (ref 0–0)

## 2011-03-15 ENCOUNTER — Other Ambulatory Visit: Payer: Self-pay | Admitting: Cardiology

## 2011-03-15 NOTE — Telephone Encounter (Signed)
Refilled amiodorone

## 2011-03-27 ENCOUNTER — Other Ambulatory Visit: Payer: Medicare Other | Admitting: *Deleted

## 2011-03-27 ENCOUNTER — Encounter: Payer: Self-pay | Admitting: Cardiology

## 2011-03-27 ENCOUNTER — Ambulatory Visit (INDEPENDENT_AMBULATORY_CARE_PROVIDER_SITE_OTHER): Payer: Medicare Other | Admitting: *Deleted

## 2011-03-27 ENCOUNTER — Ambulatory Visit (INDEPENDENT_AMBULATORY_CARE_PROVIDER_SITE_OTHER): Payer: Medicare Other | Admitting: Cardiology

## 2011-03-27 ENCOUNTER — Other Ambulatory Visit (INDEPENDENT_AMBULATORY_CARE_PROVIDER_SITE_OTHER): Payer: Medicare Other | Admitting: *Deleted

## 2011-03-27 DIAGNOSIS — C61 Malignant neoplasm of prostate: Secondary | ICD-10-CM

## 2011-03-27 DIAGNOSIS — I119 Hypertensive heart disease without heart failure: Secondary | ICD-10-CM

## 2011-03-27 DIAGNOSIS — I4891 Unspecified atrial fibrillation: Secondary | ICD-10-CM

## 2011-03-27 DIAGNOSIS — I480 Paroxysmal atrial fibrillation: Secondary | ICD-10-CM

## 2011-03-27 DIAGNOSIS — I10 Essential (primary) hypertension: Secondary | ICD-10-CM

## 2011-03-27 DIAGNOSIS — Z79899 Other long term (current) drug therapy: Secondary | ICD-10-CM

## 2011-03-27 DIAGNOSIS — Z950 Presence of cardiac pacemaker: Secondary | ICD-10-CM

## 2011-03-27 DIAGNOSIS — E785 Hyperlipidemia, unspecified: Secondary | ICD-10-CM

## 2011-03-27 DIAGNOSIS — E119 Type 2 diabetes mellitus without complications: Secondary | ICD-10-CM | POA: Insufficient documentation

## 2011-03-27 LAB — CBC WITH DIFFERENTIAL/PLATELET
Basophils Absolute: 0 10*3/uL (ref 0.0–0.1)
Eosinophils Relative: 2.3 % (ref 0.0–5.0)
Lymphs Abs: 0.9 10*3/uL (ref 0.7–4.0)
Monocytes Relative: 7.8 % (ref 3.0–12.0)
Neutrophils Relative %: 78.1 % — ABNORMAL HIGH (ref 43.0–77.0)
Platelets: 155 10*3/uL (ref 150.0–400.0)
RDW: 16 % — ABNORMAL HIGH (ref 11.5–14.6)
WBC: 7.5 10*3/uL (ref 4.5–10.5)

## 2011-03-27 LAB — POCT INR: INR: 2.5

## 2011-03-27 LAB — BASIC METABOLIC PANEL
BUN: 38 mg/dL — ABNORMAL HIGH (ref 6–23)
CO2: 28 mEq/L (ref 19–32)
Chloride: 101 mEq/L (ref 96–112)
Glucose, Bld: 142 mg/dL — ABNORMAL HIGH (ref 70–99)
Potassium: 3.9 mEq/L (ref 3.5–5.1)
Sodium: 137 mEq/L (ref 135–145)

## 2011-03-27 LAB — LIPID PANEL
Cholesterol: 148 mg/dL (ref 0–200)
VLDL: 32.4 mg/dL (ref 0.0–40.0)

## 2011-03-27 LAB — HEPATIC FUNCTION PANEL
ALT: 14 U/L (ref 0–53)
AST: 20 U/L (ref 0–37)
Albumin: 3.7 g/dL (ref 3.5–5.2)
Alkaline Phosphatase: 86 U/L (ref 39–117)
Total Protein: 7.1 g/dL (ref 6.0–8.3)

## 2011-03-27 NOTE — Assessment & Plan Note (Signed)
The patient has not been expressing any hypoglycemic events.

## 2011-03-27 NOTE — Progress Notes (Signed)
[name] Date of Birth:  07-13-1928 [facility] [address]   [address] [place], [place]  [address] [phone]           Fax   [phone]  History of Present Illness: This pleasant 77 year old gentleman is seen for a scheduled followup office visit.  He has a complex past medical history.  He has a history of ischemic heart disease and is status post CABG.  He has a history of paroxysmal atrial fibrillation.  He has a dual-chamber pacemaker for sick sinus syndrome and tachybradycardia syndrome.  It is a Medtronic device implanted in 2007.  The patient has diabetes.  He has a history of prostate cancer with ongoing difficulties with urination.  He also has a history of previous stage II adenocarcinoma of the colon No Sting July 2005 without evidence of recurrent disease.  He is on chronic Coumadin because of his paroxysmal atrial fibrillation.  He has renal insufficiency and anemia of chronic disease and is on Aranesp injections.  Current Outpatient Prescriptions  Medication Sig Dispense Refill  . amiodarone (PACERONE) 200 MG tablet TAKE (1/2) TABLET DAILY.  45 tablet  4  . Ascorbic Acid (VITAMIN C PO) Take 1 tablet by mouth daily.        Marland Kitchen aspirin 81 MG tablet Take 81 mg by mouth daily.        . chlorpheniramine-HYDROcodone (TUSSIONEX) 10-8 MG/5ML LQCR Take 5 mLs by mouth every 12 (twelve) hours as needed.  120 mL  0  . Cholecalciferol (VITAMIN D) 2000 UNITS tablet Take 2,000 Units by mouth daily.        . Darbepoetin Alfa-Albumin (ARANESP IJ) Inject as directed.        Marland Kitchen FINASTERIDE PO Take 5 mg by mouth daily.        Marland Kitchen glimepiride (AMARYL) 4 MG tablet Take 4 mg by mouth daily before breakfast. 1mg  daily      . hydrochlorothiazide 25 MG tablet Take 25 mg by mouth daily. Taking 1/2 daily      . HYDROCODONE-ACETAMINOPHEN PO Take by mouth. As directed       . ibuprofen (ADVIL,MOTRIN) 600 MG tablet 1 daily with food as needed  90 tablet  0  . metoprolol  (LOPRESSOR) 50 MG tablet Take 1 tablet (50 mg total) by mouth 2 (two) times daily.  180 tablet  3  . Multiple Vitamin (MULTIVITAMIN) tablet Take 1 tablet by mouth daily.        . nitroGLYCERIN (NITROSTAT) 0.4 MG SL tablet Place 1 tablet (0.4 mg total) under the tongue every 5 (five) minutes as needed for chest pain.  100 tablet  3  . omeprazole (PRILOSEC) 20 MG capsule Take 20 mg by mouth at bedtime.        . rosuvastatin (CRESTOR) 10 MG tablet Take 10 mg by mouth at bedtime. 1/2 tablet         . telmisartan (MICARDIS) 80 MG tablet Take 80 mg by mouth daily.        . ursodiol (ACTIGALL) 300 MG capsule Take 300 mg by mouth 2 (two) times daily.        Marland Kitchen warfarin (COUMADIN) 5 MG tablet Take 5 mg by mouth. Take as directed by the coumadin clinic         Allergies  Allergen Reactions  . Altace Cough  . Bextra (Valdecoxib) Diarrhea  . Latex   . Metformin And Related Nausea Only  . Mevacor (Lovastatin) Nausea  And Vomiting    Patient Active Problem List  Diagnoses  . PROSTATE CANCER  . ADENOMATOUS COLONIC POLYP  . DM  . HYPERLIPIDEMIA  . HYPERTENSION  . MYOCARDIAL INFARCTION  . CAD  . Atrial fibrillation  . BRADYCARDIA-TACHYCARDIA SYNDROME  . SUPRAVENTRICULAR TACHYCARDIA  . RENAL INSUFFICIENCY  . SYNCOPE  . Diabetes mellitus    History  Smoking status  . Former Smoker  . Types: Cigarettes  . Quit date: 05/22/1963  Smokeless tobacco  . Not on file    History  Alcohol Use No    Family History  Problem Relation Age of Onset  . Heart failure Father 68  . COPD    . Coronary artery disease    . Gallbladder disease Father   . Emphysema      Review of Systems: Constitutional: no fever chills diaphoresis or fatigue or change in weight.  Head and neck: no hearing loss, no epistaxis, no photophobia or visual disturbance. Respiratory: No cough, shortness of breath or wheezing. Cardiovascular: No chest pain peripheral edema, palpitations. Gastrointestinal: No abdominal  distention, no abdominal pain, no change in bowel habits hematochezia or melena. Genitourinary: No dysuria, no frequency, no urgency, no nocturia.  Positive for urinary incontinence Musculoskeletal:No arthralgias, no back pain, no gait disturbance or myalgias. Neurological: No dizziness, no headaches, no numbness, no seizures, no syncope, no weakness, no tremors. Hematologic: No lymphadenopathy, no easy bruising. Psychiatric: No confusion, no hallucinations, no sleep disturbance.    Physical Exam: Filed Vitals:   03/27/11 0851  BP: 144/68  Pulse: 68   Gen. appearance reveals a large gentleman in no acute distress.Pupils equal and reactive.   Extraocular Movements are full.  There is no scleral icterus.  The mouth and pharynx are normal.  The neck is supple.  The carotids reveal no bruits.  The jugular venous pressure is normal.  The thyroid is not enlarged.  There is no lymphadenopathy.  The chest is clear to percussion and auscultation. There are no rales or rhonchi. Expansion of the chest is symmetrical.  The precordium is quiet.  The first heart sound is normal.  The second heart sound is physiologically split.  There is no murmur gallop rub or click.  There is no abnormal lift or heave.  The abdomen is distended with  Ascites.  There is no palpable organomegaly.  Extremities reveal no edema.Strength is normal and symmetrical in all extremities.  There is no lateralizing weakness.  There are no sensory deficits.  The skin is warm and dry.  There is no rash.   Assessment / Plan: The patient is to continue same medication.  He will return in 2 months for a followup office visit and basal metabolic panel with myself or with Lawson Fiscal

## 2011-03-27 NOTE — Assessment & Plan Note (Signed)
The patient has not been experiencing any dizziness or syncope.  His exertional dyspnea has improved since he was started on Lasix last visit.  His weight is down 12 pounds since last visit.  He uses the Lasix when necessary.  He is not expressing any recent edema.

## 2011-03-27 NOTE — Patient Instructions (Addendum)
continue same dose of medications Your physician recommends that you schedule a follow-up appointment in: 2 months with Lawson Fiscal NP or Dr Patty Sermons

## 2011-03-27 NOTE — Assessment & Plan Note (Signed)
EKG today shows that the patient is back in atrial fibrillation.  He himself is not aware of his underlying rhythm because of his ventricular pacing.  He has not had any thromboembolic events.

## 2011-03-27 NOTE — Assessment & Plan Note (Signed)
The patient underwent laser surgery on his bladder and prostate last summer at [facility].  He states that the bladder is still not emptying properly.  He is scheduled to undergo urodynamic testing in the next week or 2.  He is having problem with urinary incontinence.  His urologist as mentioned the possibility of self-catheterization 2-3 times a day.

## 2011-04-05 ENCOUNTER — Encounter: Payer: Self-pay | Admitting: Oncology

## 2011-04-05 ENCOUNTER — Ambulatory Visit (HOSPITAL_BASED_OUTPATIENT_CLINIC_OR_DEPARTMENT_OTHER): Payer: Medicare Other | Admitting: Oncology

## 2011-04-05 ENCOUNTER — Telehealth: Payer: Self-pay | Admitting: *Deleted

## 2011-04-05 ENCOUNTER — Other Ambulatory Visit (HOSPITAL_BASED_OUTPATIENT_CLINIC_OR_DEPARTMENT_OTHER): Payer: Medicare Other | Admitting: Lab

## 2011-04-05 ENCOUNTER — Telehealth: Payer: Self-pay | Admitting: Oncology

## 2011-04-05 ENCOUNTER — Other Ambulatory Visit: Payer: Self-pay | Admitting: Oncology

## 2011-04-05 VITALS — BP 166/74 | HR 91 | Temp 98.1°F | Ht 67.5 in | Wt 253.8 lb

## 2011-04-05 DIAGNOSIS — Z85038 Personal history of other malignant neoplasm of large intestine: Secondary | ICD-10-CM

## 2011-04-05 DIAGNOSIS — C61 Malignant neoplasm of prostate: Secondary | ICD-10-CM

## 2011-04-05 DIAGNOSIS — R319 Hematuria, unspecified: Secondary | ICD-10-CM

## 2011-04-05 DIAGNOSIS — D631 Anemia in chronic kidney disease: Secondary | ICD-10-CM

## 2011-04-05 DIAGNOSIS — D638 Anemia in other chronic diseases classified elsewhere: Secondary | ICD-10-CM

## 2011-04-05 DIAGNOSIS — N189 Chronic kidney disease, unspecified: Secondary | ICD-10-CM

## 2011-04-05 HISTORY — DX: Anemia in chronic kidney disease: D63.1

## 2011-04-05 HISTORY — DX: Chronic kidney disease, unspecified: N18.9

## 2011-04-05 LAB — FERRITIN: Ferritin: 267 ng/mL (ref 22–322)

## 2011-04-05 LAB — COMPREHENSIVE METABOLIC PANEL
ALT: 10 U/L (ref 0–53)
Albumin: 4 g/dL (ref 3.5–5.2)
CO2: 28 mEq/L (ref 19–32)
Calcium: 9.4 mg/dL (ref 8.4–10.5)
Chloride: 101 mEq/L (ref 96–112)
Glucose, Bld: 179 mg/dL — ABNORMAL HIGH (ref 70–99)
Sodium: 137 mEq/L (ref 135–145)
Total Protein: 6.7 g/dL (ref 6.0–8.3)

## 2011-04-05 LAB — CBC WITH DIFFERENTIAL/PLATELET
BASO%: 0.2 % (ref 0.0–2.0)
Eosinophils Absolute: 0.2 10*3/uL (ref 0.0–0.5)
HCT: 33.9 % — ABNORMAL LOW (ref 38.4–49.9)
LYMPH%: 7.5 % — ABNORMAL LOW (ref 14.0–49.0)
MCHC: 34.4 g/dL (ref 32.0–36.0)
MONO#: 0.5 10*3/uL (ref 0.1–0.9)
NEUT#: 7.4 10*3/uL — ABNORMAL HIGH (ref 1.5–6.5)
Platelets: 169 10*3/uL (ref 140–400)
RBC: 3.64 10*6/uL — ABNORMAL LOW (ref 4.20–5.82)
WBC: 8.7 10*3/uL (ref 4.0–10.3)
lymph#: 0.6 10*3/uL — ABNORMAL LOW (ref 0.9–3.3)

## 2011-04-05 NOTE — Telephone Encounter (Signed)
Message copied by Burnell Blanks on Thu Apr 05, 2011  6:06 PM ------      Message from: Cassell Clement      Created: Tue Mar 27, 2011  8:59 PM       The CBC is stable .  Hgb 11.5      LFTs are normal      Cholesterol good.  BS and TG slightly high            Creatinine 2.3 up sliightly--drinke plenty of water.      CSD

## 2011-04-05 NOTE — Telephone Encounter (Signed)
Mailed to patient and faxed to [place] [phone]

## 2011-04-05 NOTE — Telephone Encounter (Signed)
Gv pt appt for dec-april2013

## 2011-04-05 NOTE — Telephone Encounter (Signed)
Message copied by Burnell Blanks on Thu Apr 05, 2011  6:04 PM ------      Message from: Cassell Clement      Created: Tue Mar 27, 2011  8:59 PM       The CBC is stable .  Hgb 11.5      LFTs are normal      Cholesterol good.  BS and TG slightly high            Creatinine 2.3 up sliightly--drinke plenty of water.      CSD

## 2011-04-05 NOTE — Progress Notes (Signed)
OFFICE PROGRESS NOTE  CC: Dr. Broadus Chancey Hemal  Cassell Clement, MD, MD [address] [place] [address]  DIAGNOSIS: 77 year old gentleman with  #1 stage II adenocarcinoma of the colon originally diagnosed in July 2005 on observation.  #2 prostate cancer status post brachii therapy is currently receiving Lupron every 6 month is followed by Dr. hemolysis at [facility].   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 male returns for followup visit. He was last seen on 12/11/2010. Overall he seems to be doing well. He does tell me that he was recently seen by Dr. Nida Boatman spell and was diagnosed with atrial fibrillation. He is followed very closely by Dr. Patty Sermons. Patient also has developed some her Neri incontinence. He is gong to have a bladder study performed in the future care other wise has no complaints he does have some fatigue he has no nausea no vomiting no fevers no chills no night sweats has gained significant weight. He is now using a cane to ambulate. Which is significantly different than in the past.  MEDICAL HISTORY: Past Medical History  Diagnosis Date  . Cancer     prostate/on Lupron  . Ischemic heart disease 05/06/06    post CARG 05/06/06  . Paroxysmal atrial fibrillation   . Pacemaker 1997    dual-chamber/for tachybradycardia syndrome  . Obese     exogenous  . Adenocarcinoma of colon 11/2003    stage 2(T3,N0,M0)  . Renal insufficiency   . Anemia of chronic disease     aranesp injections  . Dyslipidemia   . Coronary artery disease   . Anemia associated with chronic renal failure 04/05/2011    ALLERGIES:  is allergic to altace; bextra; latex; metformin and related; and mevacor.  MEDICATIONS:  Current  Outpatient Prescriptions  Medication Sig Dispense Refill  . amiodarone (PACERONE) 200 MG tablet TAKE (1/2) TABLET DAILY.  45 tablet  4  . Ascorbic Acid (VITAMIN C PO) Take 1 tablet by mouth daily.        Marland Kitchen aspirin 81 MG tablet Take 81 mg by mouth daily.        . chlorpheniramine-HYDROcodone (TUSSIONEX) 10-8 MG/5ML LQCR Take 5 mLs by mouth every 12 (twelve) hours as needed.  120 mL  0  . Cholecalciferol (VITAMIN D) 2000 UNITS tablet Take 2,000 Units by mouth daily.        . Darbepoetin Alfa-Albumin (ARANESP IJ) Inject as directed.        Marland Kitchen FINASTERIDE PO Take 5 mg by mouth daily.        Marland Kitchen glimepiride (AMARYL) 4 MG tablet Take 4 mg by mouth daily before breakfast. 1mg  daily      . hydrochlorothiazide 25 MG tablet Take 25 mg by mouth daily. Taking 1/2 daily      . HYDROCODONE-ACETAMINOPHEN PO Take by mouth. As directed       . ibuprofen (ADVIL,MOTRIN) 600 MG tablet 1 daily with food as needed  90 tablet  0  . metoprolol (LOPRESSOR) 50 MG tablet Take 1 tablet (50 mg total) by mouth 2 (two) times daily.  180 tablet  3  . Multiple Vitamin (MULTIVITAMIN) tablet Take 1 tablet by mouth daily.        . nitroGLYCERIN (NITROSTAT)  0.4 MG SL tablet Place 1 tablet (0.4 mg total) under the tongue every 5 (five) minutes as needed for chest pain.  100 tablet  3  . omeprazole (PRILOSEC) 20 MG capsule Take 20 mg by mouth at bedtime.        . rosuvastatin (CRESTOR) 10 MG tablet Take 10 mg by mouth at bedtime. 1/2 tablet         . telmisartan (MICARDIS) 80 MG tablet Take 80 mg by mouth daily.        . ursodiol (ACTIGALL) 300 MG capsule Take 300 mg by mouth 2 (two) times daily.        Marland Kitchen warfarin (COUMADIN) 5 MG tablet Take 5 mg by mouth. Take as directed by the coumadin clinic         SURGICAL HISTORY:  Past Surgical History  Procedure Date  . Laparotomy 12/04/2003    resection of rectosigmoid carcinoma/  . Radioactive seed implant 05/2005    transperianeal placement I-125 for prostate cancer/# of  seeds 55    . Cardiac catheterization 05/01/06    EF 40%/diffuse 3 vessel CAD/tight L antereior descending artery stenosis/diffuse disease proximal L anterior descending  . Coronary artery bypass graft 05/06/06    x4 with L internal mammary artery to the L anterior descending coronary artery  . Pacemaker removal 1997    REVIEW OF SYSTEMS:  A comprehensive review of systems was negative except for: Cardiovascular: positive for dyspnea and irregular heart beat Genitourinary: positive for urinary incontinence   PHYSICAL EXAMINATION: General appearance: alert, cooperative, mild distress, moderately obese and pale Neck: no adenopathy, no carotid bruit, no JVD, supple, symmetrical, trachea midline and thyroid not enlarged, symmetric, no tenderness/mass/nodules Resp: clear to auscultation bilaterally and normal percussion bilaterally Cardio: irregularly irregular rhythm GI: soft, non-tender; bowel sounds normal; no masses,  no organomegaly Extremities: extremities normal, atraumatic, no cyanosis or edema Neurologic: Alert and oriented X 3, normal strength and tone. Normal symmetric reflexes. Normal coordination and gait Sensory: normal Motor: grossly normal  ECOG PERFORMANCE STATUS: 1 - Symptomatic but completely ambulatory  Blood pressure 166/74, pulse 91, temperature 98.1 F (36.7 C), height 5' 7.5" (1.715 m), weight 253 lb 12.8 oz (115.123 kg).  LABORATORY DATA: Lab Results  Component Value Date   WBC 8.7 04/05/2011   HGB 11.7* 04/05/2011   HCT 33.9* 04/05/2011   MCV 93.0 04/05/2011   PLT 169 04/05/2011      Chemistry      Component Value Date/Time   NA 137 03/27/2011 0831   NA 141 02/09/2010 1156   K 3.9 03/27/2011 0831   K 4.5 02/09/2010 1156   CL 101 03/27/2011 0831   CL 98 02/09/2010 1156   CO2 28 03/27/2011 0831   CO2 25 02/09/2010 1156   BUN 38* 03/27/2011 0831   BUN 36* 02/09/2010 1156   CREATININE 2.3* 03/27/2011 0831   CREATININE 1.7* 02/09/2010 1156      Component Value Date/Time    CALCIUM 9.1 03/27/2011 0831   CALCIUM 8.6 02/09/2010 1156   ALKPHOS 86 03/27/2011 0831   ALKPHOS 77 02/09/2010 1156   AST 20 03/27/2011 0831   AST 25 02/09/2010 1156   ALT 14 03/27/2011 0831   BILITOT 0.8 03/27/2011 0831   BILITOT 0.70 02/09/2010 1156       RADIOGRAPHIC STUDIES:  No results found.  ASSESSMENT: 77 year old gentleman with stage II colon cancer as well as anemia of chronic disease. He also has prostate cancer. He has developed some hematuria.  He also has urinary incontinence.   PLAN: She will proceed to continue getting his Aranesp injections. However his hemoglobin today is above 11 so we will hold the injection. I will plan on having him come back in four-week time for another injection. Of note patient does have ischemic colitis he is being seen by Dr. Lucy Antigua from GI. He is currently not on any antibiotics. He will continue to see Dr. Matthias Hughs as planned I will plan on seeing him back in 3-6 months time for followup but he will continue to get receipt of his Aranesp injections every 4 weeks.   All questions were answered. The patient knows to call the clinic with any problems, questions or concerns. We can certainly see the patient much sooner if necessary.  I spent 20 minutes counseling the patient face to face. The total time spent in the appointment was 30 minutes.    Drue Second, MD Medical/Oncology [facility] [phone] (beeper) [phone] (Office)  04/05/2011, 1:51 PM

## 2011-04-05 NOTE — Telephone Encounter (Signed)
Advised wife  

## 2011-04-17 ENCOUNTER — Other Ambulatory Visit: Payer: Self-pay | Admitting: Cardiology

## 2011-04-17 DIAGNOSIS — I119 Hypertensive heart disease without heart failure: Secondary | ICD-10-CM

## 2011-04-27 ENCOUNTER — Telehealth: Payer: Self-pay | Admitting: *Deleted

## 2011-04-27 NOTE — Telephone Encounter (Signed)
Pt's wife, Liborio Nixon called states, We went to [facility], saw Dr. Ceasar Mons at Urology and he has recommended we see a Hematologist because he needs treatment. His PSI is high. We told him we see Dr. Welton Flakes and he said he needs treatment, notes from the visit will be forwarded to Dr. Welton Flakes for review. Informed Liborio Nixon I would give Dr. Welton Flakes the message and we would be looking for the notes on his recent visit with Dr. Luane School.  Dr. Luane School -Urology [facility] [phone]

## 2011-04-27 NOTE — Telephone Encounter (Signed)
ok 

## 2011-05-03 ENCOUNTER — Ambulatory Visit: Payer: Medicare Other

## 2011-05-03 ENCOUNTER — Encounter: Payer: Medicare Other | Admitting: *Deleted

## 2011-05-03 ENCOUNTER — Other Ambulatory Visit (HOSPITAL_BASED_OUTPATIENT_CLINIC_OR_DEPARTMENT_OTHER): Payer: Medicare Other | Admitting: Lab

## 2011-05-03 ENCOUNTER — Other Ambulatory Visit: Payer: Self-pay | Admitting: Oncology

## 2011-05-03 DIAGNOSIS — C61 Malignant neoplasm of prostate: Secondary | ICD-10-CM

## 2011-05-03 LAB — CBC WITH DIFFERENTIAL/PLATELET
BASO%: 0.5 % (ref 0.0–2.0)
EOS%: 2.1 % (ref 0.0–7.0)
MCH: 31.1 pg (ref 27.2–33.4)
MCHC: 33.9 g/dL (ref 32.0–36.0)
MONO#: 0.7 10*3/uL (ref 0.1–0.9)
NEUT%: 82.4 % — ABNORMAL HIGH (ref 39.0–75.0)
RBC: 3.66 10*6/uL — ABNORMAL LOW (ref 4.20–5.82)
RDW: 14.2 % (ref 11.0–14.6)
WBC: 10.1 10*3/uL (ref 4.0–10.3)
lymph#: 0.9 10*3/uL (ref 0.9–3.3)
nRBC: 0 % (ref 0–0)

## 2011-05-03 NOTE — Progress Notes (Signed)
Aranesp not given today with HGB 11.4.  Patient feeling well without complaint

## 2011-05-03 NOTE — Patient Instructions (Signed)
Call MD for problems 

## 2011-05-06 ENCOUNTER — Other Ambulatory Visit: Payer: Self-pay | Admitting: Cardiology

## 2011-05-07 ENCOUNTER — Encounter: Payer: Self-pay | Admitting: *Deleted

## 2011-05-08 ENCOUNTER — Ambulatory Visit (INDEPENDENT_AMBULATORY_CARE_PROVIDER_SITE_OTHER): Payer: Medicare Other | Admitting: *Deleted

## 2011-05-08 DIAGNOSIS — I4891 Unspecified atrial fibrillation: Secondary | ICD-10-CM

## 2011-05-08 LAB — POCT INR: INR: 2

## 2011-05-28 ENCOUNTER — Telehealth: Payer: Self-pay | Admitting: Oncology

## 2011-05-28 ENCOUNTER — Other Ambulatory Visit: Payer: Self-pay | Admitting: Oncology

## 2011-05-28 ENCOUNTER — Encounter: Payer: Self-pay | Admitting: Cardiology

## 2011-05-28 ENCOUNTER — Telehealth: Payer: Self-pay | Admitting: *Deleted

## 2011-05-28 ENCOUNTER — Ambulatory Visit (INDEPENDENT_AMBULATORY_CARE_PROVIDER_SITE_OTHER): Payer: Medicare Other | Admitting: Cardiology

## 2011-05-28 DIAGNOSIS — IMO0002 Reserved for concepts with insufficient information to code with codable children: Secondary | ICD-10-CM

## 2011-05-28 DIAGNOSIS — I10 Essential (primary) hypertension: Secondary | ICD-10-CM

## 2011-05-28 DIAGNOSIS — C61 Malignant neoplasm of prostate: Secondary | ICD-10-CM

## 2011-05-28 DIAGNOSIS — E7800 Pure hypercholesterolemia, unspecified: Secondary | ICD-10-CM

## 2011-05-28 DIAGNOSIS — E119 Type 2 diabetes mellitus without complications: Secondary | ICD-10-CM

## 2011-05-28 DIAGNOSIS — I4891 Unspecified atrial fibrillation: Secondary | ICD-10-CM

## 2011-05-28 DIAGNOSIS — R5383 Other fatigue: Secondary | ICD-10-CM

## 2011-05-28 LAB — BASIC METABOLIC PANEL
Calcium: 9.2 mg/dL (ref 8.4–10.5)
GFR: 25.56 mL/min — ABNORMAL LOW (ref >60.00)
Glucose, Bld: 146 mg/dL — ABNORMAL HIGH (ref 70–99)
Potassium: 4.1 mEq/L (ref 3.5–5.1)
Sodium: 138 mEq/L (ref 135–145)

## 2011-05-28 NOTE — Progress Notes (Signed)
[name] Date of Birth:  1929-02-17 [facility] [address] [place], [place]  [address] [phone]         Fax   [phone]  History of Present Illness: This very pleasant 78 year old gentleman is seen back for a scheduled followup office visit.  He has a complex past medical history.  He has a history of ischemic heart disease and is status post CABG.  He has a history of paroxysmal atrial fibrillation and he has tachybradycardia syndrome and has a dual-chamber pacemaker in place.  It is a Medtronic device implanted in 2007.  The patient has a history of diabetes.  He has a history of prostate cancer with ongoing problems with urination and rising PSA level.  He also has a history of stage II adenocarcinoma.  He is on chronic Coumadin because of his atrial fibrillation.  He has chronic anemia and renal insufficiency.  Current Outpatient Prescriptions  Medication Sig Dispense Refill  . amiodarone (PACERONE) 200 MG tablet TAKE (1/2) TABLET DAILY.  45 tablet  4  . Ascorbic Acid (VITAMIN C PO) Take 1 tablet by mouth daily.        Marland Kitchen aspirin 81 MG tablet Take 81 mg by mouth daily.        . chlorpheniramine-HYDROcodone (TUSSIONEX) 10-8 MG/5ML LQCR Take 5 mLs by mouth every 12 (twelve) hours as needed.  120 mL  0  . Cholecalciferol (VITAMIN D) 2000 UNITS tablet Take 2,000 Units by mouth daily.        . Darbepoetin Alfa-Albumin (ARANESP IJ) Inject as directed.        Marland Kitchen FINASTERIDE PO Take 5 mg by mouth daily.        Marland Kitchen glimepiride (AMARYL) 4 MG tablet Take 4 mg by mouth daily before breakfast. 1mg  daily      . hydrochlorothiazide (HYDRODIURIL) 25 MG tablet TAKE AS DIRECTED.  90 tablet  3  . HYDROCODONE-ACETAMINOPHEN PO Take by mouth. As directed       . ibuprofen (ADVIL,MOTRIN) 600 MG tablet 1 daily with food as needed  90 tablet  0  . metoprolol (LOPRESSOR) 50 MG tablet Take 1 tablet (50 mg total) by mouth 2 (two) times daily.  180 tablet  3  . MICARDIS 80 MG  tablet TAKE 1 TABLET ONCE DAILY.  90 each  3  . Multiple Vitamin (MULTIVITAMIN) tablet Take 1 tablet by mouth daily.        . nitroGLYCERIN (NITROSTAT) 0.4 MG SL tablet Place 1 tablet (0.4 mg total) under the tongue every 5 (five) minutes as needed for chest pain.  100 tablet  3  . omeprazole (PRILOSEC) 20 MG capsule Take 20 mg by mouth at bedtime.        . rosuvastatin (CRESTOR) 10 MG tablet Take 10 mg by mouth at bedtime. 1/2 tablet         . ursodiol (ACTIGALL) 300 MG capsule Take 300 mg by mouth 2 (two) times daily.        Marland Kitchen warfarin (COUMADIN) 5 MG tablet Take 5 mg by mouth. Take as directed by the coumadin clinic         Allergies  Allergen Reactions  . Altace Cough  . Bextra (Valdecoxib) Diarrhea  . Latex   . Metformin And Related Nausea Only  . Mevacor (Lovastatin) Nausea And Vomiting    Patient Active Problem List  Diagnoses  . PROSTATE CANCER  . ADENOMATOUS COLONIC POLYP  . DM  . HYPERLIPIDEMIA  .  HYPERTENSION  . MYOCARDIAL INFARCTION  . CAD  . Atrial fibrillation  . BRADYCARDIA-TACHYCARDIA SYNDROME  . SUPRAVENTRICULAR TACHYCARDIA  . RENAL INSUFFICIENCY  . SYNCOPE  . Diabetes mellitus  . Anemia associated with chronic renal failure    History  Smoking status  . Former Smoker  . Types: Cigarettes  . Quit date: 05/22/1963  Smokeless tobacco  . Not on file    History  Alcohol Use No    Family History  Problem Relation Age of Onset  . Heart failure Father 87  . COPD    . Coronary artery disease    . Gallbladder disease Father   . Emphysema      Review of Systems: Constitutional: no fever chills diaphoresis or fatigue or change in weight.  Head and neck: no hearing loss, no epistaxis, no photophobia or visual disturbance. Respiratory: No cough, shortness of breath or wheezing. Cardiovascular: No chest pain peripheral edema, palpitations. Gastrointestinal: No abdominal distention, no abdominal pain, no change in bowel habits hematochezia or  melena. Genitourinary: No dysuria, no frequency, no urgency, no nocturia. Musculoskeletal:No arthralgias, no back pain, no gait disturbance or myalgias. Neurological: No dizziness, no headaches, no numbness, no seizures, no syncope, no weakness, no tremors. Hematologic: No lymphadenopathy, no easy bruising. Psychiatric: No confusion, no hallucinations, no sleep disturbance.    Physical Exam: Filed Vitals:   05/28/11 0905  BP: 120/78  Pulse: 80   The general appearance reveals an overweight gentleman who walks with a cane.  He has some difficulty getting up and down from the exam table.  He is in no acute distress.Pupils equal and reactive.   Extraocular Movements are full.  There is no scleral icterus.  The mouth and pharynx are normal.  The neck is supple.  The carotids reveal no bruits.  The jugular venous pressure is normal.  The thyroid is not enlarged.  There is no lymphadenopathy.  The chest is clear to percussion and auscultation. There are no rales or rhonchi. Expansion of the chest is symmetrical.  The precordium is quiet.  The first heart sound is normal.  The second heart sound is physiologically split.  There is no murmur gallop rub or click.  There is no abnormal lift or heave.  The abdomen is obese and nontender without palpable masses or organomegaly.  The extremities show mild to trace pretibial and pedal edema.Strength is normal and symmetrical in all extremities.  There is no lateralizing weakness.  There are no sensory deficits.  The skin is warm and dry.  There is no rash.   Assessment / Plan: Continue same medication.  Recheck in 2 months for followup office visit EKG CBC hemoglobin A1c lipid panel hepatic function panel and basal metabolic panel

## 2011-05-28 NOTE — Assessment & Plan Note (Signed)
Unfortunately his prostate cancer is not doing well and his PSA level continues to rise.  His doctors are considering new type of chemotherapy.

## 2011-05-28 NOTE — Telephone Encounter (Signed)
called pt and informed him of appt for 01/08

## 2011-05-28 NOTE — Patient Instructions (Signed)
Will obtain labs today and call you with the results Your physician recommends that you schedule a follow-up appointment in: 2months with fasting labs (lp/bmet/hfp/a1c) and EKG

## 2011-05-28 NOTE — Assessment & Plan Note (Signed)
The patient has not been expressing any symptoms of increased congestive heart failure.  He has had mild peripheral edema.  He is not having any angina pectoris

## 2011-05-28 NOTE — Assessment & Plan Note (Addendum)
His pulse today is regular and he does have a known pacemaker.  We did not do EKG today.  He has not had any TIA symptoms from his atrial fib.

## 2011-05-28 NOTE — Telephone Encounter (Signed)
Per MD pt to be seen lab at11am, MD appt at 1130. S/w pt's wife who verbalized/confirmed appt date/time.

## 2011-05-28 NOTE — Assessment & Plan Note (Signed)
Patient is not having any hypoglycemic episodes. 

## 2011-05-29 ENCOUNTER — Other Ambulatory Visit: Payer: Medicare Other | Admitting: Lab

## 2011-05-29 ENCOUNTER — Ambulatory Visit (HOSPITAL_BASED_OUTPATIENT_CLINIC_OR_DEPARTMENT_OTHER): Payer: Medicare Other | Admitting: Oncology

## 2011-05-29 ENCOUNTER — Telehealth: Payer: Self-pay | Admitting: *Deleted

## 2011-05-29 VITALS — BP 125/65 | HR 89 | Temp 97.9°F | Ht 68.0 in | Wt 250.1 lb

## 2011-05-29 DIAGNOSIS — C61 Malignant neoplasm of prostate: Secondary | ICD-10-CM

## 2011-05-29 LAB — CBC WITH DIFFERENTIAL/PLATELET
Basophils Absolute: 0 10*3/uL (ref 0.0–0.1)
Eosinophils Absolute: 0.2 10*3/uL (ref 0.0–0.5)
HGB: 11.4 g/dL — ABNORMAL LOW (ref 13.0–17.1)
MCV: 93.7 fL (ref 79.3–98.0)
MONO%: 5.3 % (ref 0.0–14.0)
NEUT#: 6.9 10*3/uL — ABNORMAL HIGH (ref 1.5–6.5)
Platelets: 166 10*3/uL (ref 140–400)
RDW: 13.6 % (ref 11.0–14.6)

## 2011-05-29 LAB — COMPREHENSIVE METABOLIC PANEL
Albumin: 4 g/dL (ref 3.5–5.2)
Alkaline Phosphatase: 89 U/L (ref 39–117)
BUN: 37 mg/dL — ABNORMAL HIGH (ref 6–23)
CO2: 25 mEq/L (ref 19–32)
Calcium: 9 mg/dL (ref 8.4–10.5)
Glucose, Bld: 255 mg/dL — ABNORMAL HIGH (ref 70–99)
Potassium: 4.4 mEq/L (ref 3.5–5.3)

## 2011-05-29 LAB — PSA: PSA: 4.31 ng/mL — ABNORMAL HIGH (ref <=4.00)

## 2011-05-29 NOTE — Telephone Encounter (Signed)
gave patient appointment for scans in 06-12-2011 arrival time 7:45am gave patient appointment with dr.khan

## 2011-05-31 ENCOUNTER — Other Ambulatory Visit (HOSPITAL_BASED_OUTPATIENT_CLINIC_OR_DEPARTMENT_OTHER): Payer: Medicare Other | Admitting: Lab

## 2011-05-31 ENCOUNTER — Ambulatory Visit: Payer: Medicare Other

## 2011-05-31 ENCOUNTER — Other Ambulatory Visit: Payer: Self-pay | Admitting: Oncology

## 2011-05-31 DIAGNOSIS — C61 Malignant neoplasm of prostate: Secondary | ICD-10-CM

## 2011-05-31 DIAGNOSIS — D631 Anemia in chronic kidney disease: Secondary | ICD-10-CM

## 2011-05-31 LAB — CBC WITH DIFFERENTIAL/PLATELET
BASO%: 0.5 % (ref 0.0–2.0)
Eosinophils Absolute: 0.2 10*3/uL (ref 0.0–0.5)
MONO#: 0.6 10*3/uL (ref 0.1–0.9)
NEUT#: 6.5 10*3/uL (ref 1.5–6.5)
Platelets: 172 10*3/uL (ref 140–400)
RBC: 3.62 10*6/uL — ABNORMAL LOW (ref 4.20–5.82)
RDW: 13.5 % (ref 11.0–14.6)
WBC: 8.2 10*3/uL (ref 4.0–10.3)
lymph#: 0.9 10*3/uL (ref 0.9–3.3)
nRBC: 0 % (ref 0–0)

## 2011-05-31 MED ORDER — DARBEPOETIN ALFA-POLYSORBATE 500 MCG/ML IJ SOLN: 300.0000 ug | Freq: Once | INTRAMUSCULAR | Status: DC

## 2011-06-01 ENCOUNTER — Telehealth: Payer: Self-pay | Admitting: *Deleted

## 2011-06-01 NOTE — Telephone Encounter (Signed)
Message copied by Burnell Blanks on Fri Jun 01, 2011  8:45 AM ------      Message from: Cassell Clement      Created: Mon May 28, 2011  9:23 PM       Please report.  The labs are stable.  Continue same meds.  Continue careful diet.  BS better.  Creatinine 2.6 higher.  Drink plenty of water.

## 2011-06-01 NOTE — Telephone Encounter (Signed)
Advised wife of labs 

## 2011-06-04 NOTE — Progress Notes (Signed)
OFFICE PROGRESS NOTE  CC: Dr. Broadus Fontaine Hemal  Cassell Clement, MD, MD [address] [place] [address]  DIAGNOSIS: 78 year old gentleman with  #1 stage II adenocarcinoma of the colon originally diagnosed in July 2005 on observation.  #2 prostate cancer status post brachii therapy is currently receiving Lupron every 6 month is followed by Dr. hemolysis at [facility].   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 male returns for followup visit. He was recently seen at [facility] by Dr. Luane School who did a PSA and patient has had a rise in the psa. It was recommended that patient be seen by a medical oncologist. So patient called my office and he is now being seen in follow up. Clinically patient seems to be doing well. He denies any fever, chills, nausea or vomting , he no aches or pains. He still is having some urinary complaints with some retention. He denies hematuria or hematochezia. Remainder of the review of systems is negative. MEDICAL HISTORY: Past Medical History  Diagnosis Date  . Cancer     prostate/on Lupron  . Ischemic heart disease 05/06/06    post CARG 05/06/06  . Paroxysmal atrial fibrillation   . Pacemaker 1997    dual-chamber/for tachybradycardia syndrome  . Obese     exogenous  . Adenocarcinoma of colon 11/2003    stage 2(T3,N0,M0)  . Renal insufficiency   . Anemia of chronic disease     aranesp injections  . Dyslipidemia   . Coronary artery disease   . Anemia associated with chronic renal failure 04/05/2011    ALLERGIES:  is allergic to altace; bextra; latex; metformin and related; and mevacor.  MEDICATIONS:  Current Outpatient Prescriptions  Medication Sig Dispense Refill  . amiodarone (PACERONE) 200 MG tablet TAKE (1/2)  TABLET DAILY.  45 tablet  4  . Ascorbic Acid (VITAMIN C PO) Take 1 tablet by mouth daily.        Marland Kitchen aspirin 81 MG tablet Take 81 mg by mouth daily.        . chlorpheniramine-HYDROcodone (TUSSIONEX) 10-8 MG/5ML LQCR Take 5 mLs by mouth every 12 (twelve) hours as needed.  120 mL  0  . Cholecalciferol (VITAMIN D) 2000 UNITS tablet Take 2,000 Units by mouth daily.        . Darbepoetin Alfa-Albumin (ARANESP IJ) Inject as directed.        Marland Kitchen FINASTERIDE PO Take 5 mg by mouth daily.        Marland Kitchen glimepiride (AMARYL) 4 MG tablet Take 4 mg by mouth daily before breakfast. 1mg  daily      . hydrochlorothiazide (HYDRODIURIL) 25 MG tablet TAKE AS DIRECTED.  90 tablet  3  . HYDROCODONE-ACETAMINOPHEN PO Take by mouth. As directed       . ibuprofen (ADVIL,MOTRIN) 600 MG tablet 1 daily with food as needed  90 tablet  0  . metoprolol (LOPRESSOR) 50 MG tablet Take 1 tablet (50 mg total) by mouth 2 (two) times daily.  180 tablet  3  . MICARDIS 80 MG tablet TAKE 1 TABLET ONCE DAILY.  90 each  3  . Multiple Vitamin (MULTIVITAMIN) tablet Take 1 tablet by mouth daily.        . nitroGLYCERIN (NITROSTAT) 0.4 MG SL tablet  Place 1 tablet (0.4 mg total) under the tongue every 5 (five) minutes as needed for chest pain.  100 tablet  3  . omeprazole (PRILOSEC) 20 MG capsule Take 20 mg by mouth at bedtime.        . rosuvastatin (CRESTOR) 10 MG tablet Take 10 mg by mouth at bedtime. 1/2 tablet         . ursodiol (ACTIGALL) 300 MG capsule Take 300 mg by mouth 2 (two) times daily.        Marland Kitchen warfarin (COUMADIN) 5 MG tablet Take 5 mg by mouth. Take as directed by the coumadin clinic         SURGICAL HISTORY:  Past Surgical History  Procedure Date  . Laparotomy 12/04/2003    resection of rectosigmoid carcinoma/  . Radioactive seed implant 05/2005    transperianeal placement I-125 for prostate cancer/# of  seeds 55  . Cardiac catheterization 05/01/06    EF 40%/diffuse 3 vessel CAD/tight L antereior descending artery  stenosis/diffuse disease proximal L anterior descending  . Coronary artery bypass graft 05/06/06    x4 with L internal mammary artery to the L anterior descending coronary artery  . Pacemaker removal 1997    REVIEW OF SYSTEMS:  A comprehensive review of systems was negative except for: Cardiovascular: positive for dyspnea and irregular heart beat Genitourinary: positive for urinary incontinence   PHYSICAL EXAMINATION: General appearance: alert, cooperative, mild distress, moderately obese and pale Neck: no adenopathy, no carotid bruit, no JVD, supple, symmetrical, trachea midline and thyroid not enlarged, symmetric, no tenderness/mass/nodules Resp: clear to auscultation bilaterally and normal percussion bilaterally Cardio: irregularly irregular rhythm GI: soft, non-tender; bowel sounds normal; no masses,  no organomegaly Extremities: extremities normal, atraumatic, no cyanosis or edema Neurologic: Alert and oriented X 3, normal strength and tone. Normal symmetric reflexes. Normal coordination and gait Sensory: normal Motor: grossly normal  ECOG PERFORMANCE STATUS: 1 - Symptomatic but completely ambulatory  Blood pressure 125/65, pulse 89, temperature 97.9 F (36.6 C), height 5\' 8"  (1.727 m), weight 250 lb 1.6 oz (113.445 kg).  LABORATORY DATA: Lab Results  Component Value Date   WBC 8.2 05/31/2011   HGB 11.3* 05/31/2011   HCT 33.2* 05/31/2011   MCV 91.7 05/31/2011   PLT 172 05/31/2011      Chemistry      Component Value Date/Time   NA 137 05/29/2011 1101   NA 141 02/09/2010 1156   K 4.4 05/29/2011 1101   K 4.5 02/09/2010 1156   CL 100 05/29/2011 1101   CL 98 02/09/2010 1156   CO2 25 05/29/2011 1101   CO2 25 02/09/2010 1156   BUN 37* 05/29/2011 1101   BUN 36* 02/09/2010 1156   CREATININE 2.34* 05/29/2011 1101   CREATININE 1.7* 02/09/2010 1156      Component Value Date/Time   CALCIUM 9.0 05/29/2011 1101   CALCIUM 8.6 02/09/2010 1156   ALKPHOS 89 05/29/2011 1101   ALKPHOS 77 02/09/2010 1156    AST 13 05/29/2011 1101   AST 25 02/09/2010 1156   ALT 9 05/29/2011 1101   BILITOT 0.6 05/29/2011 1101   BILITOT 0.70 02/09/2010 1156       RADIOGRAPHIC STUDIES:  No results found.  ASSESSMENT: 78 year old gentleman with: 1.  stage II colon cancer with no evidence of recurrence 2. Anemia of chronic disease and renal deficiency on aranesp injections 3. Prostate cancer now with a rising PSA but no clinical evidence of recurrence  PLAN:  1. Prostate cancer, we will order  restaging scan with CT chest/abdomen/pelvis and bone scan 2. Follow up in 1 -2 months.  3. Discussed provenge and we will try to get preauhtorization from his insurance if there is clear evidence of recurrence 4. All questions are answeered.    All questions were answered. The patient knows to call the clinic with any problems, questions or concerns. We can certainly see the patient much sooner if necessary.  I spent 20 minutes counseling the patient face to face. The total time spent in the appointment was 30 minutes.    Drue Second, MD Medical/Oncology [facility] [phone] (beeper) [phone] (Office)

## 2011-06-12 ENCOUNTER — Encounter (HOSPITAL_COMMUNITY): Payer: Self-pay

## 2011-06-12 ENCOUNTER — Encounter (HOSPITAL_COMMUNITY)
Admission: RE | Admit: 2011-06-12 | Discharge: 2011-06-12 | Disposition: A | Discharge status: Home / Self Care | Payer: Medicare Other | Source: Ambulatory Visit | Attending: Oncology | Admitting: Oncology

## 2011-06-12 ENCOUNTER — Other Ambulatory Visit: Payer: Self-pay | Admitting: Oncology

## 2011-06-12 DIAGNOSIS — N289 Disorder of kidney and ureter, unspecified: Secondary | ICD-10-CM | POA: Insufficient documentation

## 2011-06-12 DIAGNOSIS — J984 Other disorders of lung: Secondary | ICD-10-CM | POA: Insufficient documentation

## 2011-06-12 DIAGNOSIS — E049 Nontoxic goiter, unspecified: Secondary | ICD-10-CM | POA: Insufficient documentation

## 2011-06-12 DIAGNOSIS — K8020 Calculus of gallbladder without cholecystitis without obstruction: Secondary | ICD-10-CM | POA: Insufficient documentation

## 2011-06-12 DIAGNOSIS — Z950 Presence of cardiac pacemaker: Secondary | ICD-10-CM | POA: Insufficient documentation

## 2011-06-12 DIAGNOSIS — C61 Malignant neoplasm of prostate: Secondary | ICD-10-CM

## 2011-06-12 DIAGNOSIS — I2510 Atherosclerotic heart disease of native coronary artery without angina pectoris: Secondary | ICD-10-CM | POA: Insufficient documentation

## 2011-06-12 MED ORDER — TECHNETIUM TC 99M MEDRONATE IV KIT
25.0000 | PACK | Freq: Once | INTRAVENOUS | Status: AC | PRN
  Administered 2011-06-12: 25 via INTRAVENOUS

## 2011-06-15 ENCOUNTER — Other Ambulatory Visit: Payer: Self-pay | Admitting: Cardiology

## 2011-06-15 ENCOUNTER — Telehealth: Payer: Self-pay | Admitting: *Deleted

## 2011-06-15 DIAGNOSIS — N390 Urinary tract infection, site not specified: Secondary | ICD-10-CM

## 2011-06-15 MED ORDER — CIPROFLOXACIN HCL 500 MG PO TABS: 500.0000 mg | ORAL_TABLET | Freq: Two times a day (BID) | ORAL | Status: AC

## 2011-06-15 NOTE — Telephone Encounter (Signed)
Advised wife  

## 2011-06-15 NOTE — Telephone Encounter (Signed)
Patient only take warfarin 2.5 mg 5 days a week per wife and anticoag visit, please advise on warfarin.  Also with Cipro, warning with Amio coming up, ok?

## 2011-06-15 NOTE — Telephone Encounter (Signed)
Rx something 

## 2011-06-15 NOTE — Telephone Encounter (Signed)
Thanks Agree 

## 2011-06-15 NOTE — Telephone Encounter (Signed)
Pt's wife calling burning  when voiding and cloudy urine, requesting an rx , uses [facility]

## 2011-06-15 NOTE — Telephone Encounter (Signed)
Call in Cipro 500 mg BID #14.  While on Cipro decrease warfarin to 5 mg alternate with 2.5 mg.

## 2011-06-15 NOTE — Telephone Encounter (Signed)
Reduce warfarin to only 3 days a week while on cipro.  Okay to give cipro with amiodarone.

## 2011-06-15 NOTE — Telephone Encounter (Signed)
Patient wife phoned in and patient is having burning when voiding and cloudy urine.   Dr. Patty Sermons wants to Rx Cipro 500 mg twice daily for 7 days.  Ok with Amiodarone.  Will decreased warfarin to 2.5 mg 3 days a week while on Cipro.  Advised wife

## 2011-06-19 ENCOUNTER — Ambulatory Visit (INDEPENDENT_AMBULATORY_CARE_PROVIDER_SITE_OTHER): Payer: Medicare Other | Admitting: *Deleted

## 2011-06-19 DIAGNOSIS — I4891 Unspecified atrial fibrillation: Secondary | ICD-10-CM

## 2011-06-27 ENCOUNTER — Ambulatory Visit: Payer: Medicare Other | Admitting: Lab

## 2011-06-27 ENCOUNTER — Telehealth: Payer: Self-pay | Admitting: *Deleted

## 2011-06-27 ENCOUNTER — Ambulatory Visit (HOSPITAL_BASED_OUTPATIENT_CLINIC_OR_DEPARTMENT_OTHER): Payer: Medicare Other | Admitting: Oncology

## 2011-06-27 DIAGNOSIS — C61 Malignant neoplasm of prostate: Secondary | ICD-10-CM

## 2011-06-27 DIAGNOSIS — N289 Disorder of kidney and ureter, unspecified: Secondary | ICD-10-CM

## 2011-06-27 DIAGNOSIS — C189 Malignant neoplasm of colon, unspecified: Secondary | ICD-10-CM

## 2011-06-27 LAB — CBC WITH DIFFERENTIAL/PLATELET
BASO%: 0 % (ref 0.0–2.0)
EOS%: 2.7 % (ref 0.0–7.0)
HCT: 31.4 % — ABNORMAL LOW (ref 38.4–49.9)
LYMPH%: 8.4 % — ABNORMAL LOW (ref 14.0–49.0)
MCH: 32.3 pg (ref 27.2–33.4)
MCHC: 34.5 g/dL (ref 32.0–36.0)
MONO#: 0.7 10*3/uL (ref 0.1–0.9)
NEUT%: 79.5 % — ABNORMAL HIGH (ref 39.0–75.0)
Platelets: 153 10*3/uL (ref 140–400)

## 2011-06-27 NOTE — Progress Notes (Signed)
OFFICE PROGRESS NOTE  CC: Dr. Broadus Axil Hemal  Cassell Clement, MD, MD [address] [place] [address]  DIAGNOSIS: 78 year old gentleman with  #1 stage II adenocarcinoma of the colon originally diagnosed in July 2005 on observation.  #2 prostate cancer status post brachii therapy is currently receiving Lupron every 6 month is followed by Dr. Luane School at [facility].   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 male returns for followup visit. Had staging scans performed. He had a CT of the chest abdomen and pelvis as well as a bone scan performed which shows no evidence of metastatic recurrent disease. He had a PSA done on his last visit that was elevated at 4.31. Today he had a PSA done which was slightly higher at 5.17. And a clinically to be without any significant complaints he is denying any fevers chills night sweats headaches.  He does have myalgias and arthralgias. He does have chronic focal 2 ambulating do to his weight as well as his neuropathies. He does get somewhat short of breath when he does anything. Remainder of the 10 point review of systems is negative.  MEDICAL HISTORY: Past Medical History  Diagnosis Date  . Cancer     prostate/on Lupron  . Ischemic heart disease 05/06/06    post CARG 05/06/06  . Paroxysmal atrial fibrillation   . Pacemaker 1997    dual-chamber/for tachybradycardia syndrome  . Obese     exogenous  . Adenocarcinoma of colon 11/2003    stage 2(T3,N0,M0)  . Renal insufficiency   . Anemia of chronic disease     aranesp injections  . Dyslipidemia   . Coronary artery disease   . Anemia associated with chronic renal failure 04/05/2011    ALLERGIES:  is allergic to altace; bextra; latex; metformin and related;  and mevacor.  MEDICATIONS:  Current Outpatient Prescriptions  Medication Sig Dispense Refill  . amiodarone (PACERONE) 200 MG tablet TAKE (1/2) TABLET DAILY.  45 tablet  4  . Ascorbic Acid (VITAMIN C PO) Take 1 tablet by mouth daily.        Marland Kitchen aspirin 81 MG tablet Take 81 mg by mouth daily.        . chlorpheniramine-HYDROcodone (TUSSIONEX) 10-8 MG/5ML LQCR Take 5 mLs by mouth every 12 (twelve) hours as needed.  120 mL  0  . Cholecalciferol (VITAMIN D) 2000 UNITS tablet Take 2,000 Units by mouth daily.        . Darbepoetin Alfa-Albumin (ARANESP IJ) Inject as directed.        Marland Kitchen FINASTERIDE PO Take 5 mg by mouth daily.        Marland Kitchen glimepiride (AMARYL) 4 MG tablet Take 4 mg by mouth daily before breakfast. 1mg  daily      . hydrochlorothiazide (HYDRODIURIL) 25 MG tablet TAKE AS DIRECTED.  90 tablet  3  . HYDROCODONE-ACETAMINOPHEN PO Take by mouth. As directed       . ibuprofen (ADVIL,MOTRIN) 600 MG tablet 1 daily with food as needed  90 tablet  0  . metoprolol (LOPRESSOR) 50 MG tablet Take 1 tablet (50 mg total) by mouth 2 (two) times daily.  180 tablet  3  . MICARDIS 80 MG tablet TAKE 1 TABLET ONCE DAILY.  90 each  3  . Multiple Vitamin (MULTIVITAMIN) tablet Take 1 tablet by mouth daily.        . nitroGLYCERIN (NITROSTAT) 0.4 MG SL tablet Place 1 tablet (0.4 mg total) under the tongue every 5 (five) minutes as needed for chest pain.  100 tablet  3  . omeprazole (PRILOSEC) 20 MG capsule Take 20 mg by mouth at bedtime.        . rosuvastatin (CRESTOR) 10 MG tablet Take 10 mg by mouth at bedtime. 1/2 tablet         . ursodiol (ACTIGALL) 300 MG capsule Take 300 mg by mouth 2 (two) times daily.        Marland Kitchen warfarin (COUMADIN) 5 MG tablet Take 5 mg by mouth. Take as directed by the coumadin clinic        SURGICAL HISTORY:  Past Surgical History  Procedure Date  . Laparotomy 12/04/2003    resection of rectosigmoid carcinoma/  . Radioactive seed implant 05/2005    transperianeal placement I-125 for  prostate cancer/# of  seeds 55  . Cardiac catheterization 05/01/06    EF 40%/diffuse 3 vessel CAD/tight L antereior descending artery stenosis/diffuse disease proximal L anterior descending  . Coronary artery bypass graft 05/06/06    x4 with L internal mammary artery to the L anterior descending coronary artery  . Pacemaker removal 1997    REVIEW OF SYSTEMS:  A comprehensive review of systems was negative except for: Cardiovascular: positive for dyspnea and irregular heart beat Genitourinary: positive for urinary incontinence   PHYSICAL EXAMINATION: General appearance: alert, cooperative, mild distress, moderately obese and pale Neck: no adenopathy, no carotid bruit, no JVD, supple, symmetrical, trachea midline and thyroid not enlarged, symmetric, no tenderness/mass/nodules Resp: clear to auscultation bilaterally and normal percussion bilaterally Cardio: irregularly irregular rhythm GI: soft, non-tender; bowel sounds normal; no masses,  no organomegaly Extremities: extremities normal, atraumatic, no cyanosis or edema Neurologic: Alert and oriented X 3, normal strength and tone. Normal symmetric reflexes. Normal coordination and gait Sensory: normal Motor: grossly normal  ECOG PERFORMANCE STATUS: 1 - Symptomatic but completely ambulatory  Blood pressure 148/71, pulse 90, temperature 97.9 F (36.6 C), height 5\' 8"  (1.727 m), weight 254 lb 8 oz (115.44 kg).  LABORATORY DATA: Lab Results  Component Value Date   WBC 7.8 06/27/2011   HGB 10.8* 06/27/2011   HCT 31.4* 06/27/2011   MCV 93.7 06/27/2011   PLT 153 06/27/2011      Chemistry      Component Value Date/Time   NA 137 05/29/2011 1101   NA 141 02/09/2010 1156   K 4.4 05/29/2011 1101   K 4.5 02/09/2010 1156   CL 100 05/29/2011 1101   CL 98 02/09/2010 1156   CO2 25 05/29/2011 1101   CO2 25 02/09/2010 1156   BUN 37* 05/29/2011 1101   BUN 36* 02/09/2010 1156   CREATININE 2.34* 05/29/2011 1101   CREATININE 1.7* 02/09/2010 1156      Component Value  Date/Time   CALCIUM 9.0 05/29/2011 1101   CALCIUM 8.6 02/09/2010 1156   ALKPHOS 89 05/29/2011 1101   ALKPHOS 77 02/09/2010 1156   AST 13 05/29/2011 1101   AST 25 02/09/2010 1156   ALT 9 05/29/2011 1101   BILITOT 0.6 05/29/2011 1101   BILITOT 0.70 02/09/2010 1156    Results for Jeffery Gibson, Jeffery Gibson (MRN [id number]) as of 06/27/2011 18:10  Ref. Range 06/27/2011 09:38  PSA Latest Range: <=4.00 ng/mL 5.17 (H)  PSA, Free No range found 1.02  PSA,  Free Pct Latest Range: >25 % 20 (L)   RADIOGRAPHIC STUDIES: Clinical Data: Prostate cancer with rising PSA.  CT CHEST, ABDOMEN AND PELVIS WITHOUT CONTRAST  Technique: Multidetector CT imaging of the chest, abdomen and  pelvis was performed following the standard protocol without IV  contrast.  Comparison: CT abdomen 08/02/2010.  CT CHEST  Findings: The chest wall is unremarkable. A right-sided pacemaker  is noted. No supraclavicular or axillary lymphadenopathy. 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 Stable surgical changes  from bypass surgery.  The heart is normal in size. No pericardial effusion. No  mediastinal or hilar lymphadenopathy. The aorta is stable in  caliber. Stable atherosclerotic changes. Stable coronary artery  calcifications. The esophagus is grossly normal.  Examination of the lung parenchyma demonstrates no acute pulmonary  findings. There is a single slightly ill-defined nodular density  in the superior segment of the right lower lobe on image number 31  which measures 9.5 mm. This could be a focal area of inflammation  or atelectasis but a pulmonary nodule cannot be excluded. A short-  term follow-up noncontrast chest CT and 3 months is suggested. No  other pulmonary abnormalities. Minimal basilar atelectasis.  IMPRESSION:  1. Stable substernal thyroid goiter.  2. Stable atherosclerotic changes involving the aorta and coronary  arteries.  3. 9.5 mm airspace density in the right lower lobe is an  indeterminate finding. A  follow-up noncontrast chest CT in 3  months is suggested.  CT ABDOMEN AND PELVIS  Findings: The liver is unremarkable without contrast. No focal  lesions or biliary dilatation. Gallstones are noted the  gallbladder. No findings for acute cholecystitis. The common bile  duct is normal in caliber. The pancreas is normal. The spleen is  normal in size. No focal lesions. The adrenal glands and kidneys  are unremarkable except for bilateral renal cysts and renal  atrophy. A large amount of perirenal fat is noted.  The stomach, duodenum, small bowel and colon are grossly normal.  No mass lesion or inflammatory changes.  No mesenteric or retroperitoneal masses or lymphadenopathy. The  aorta demonstrates stable atherosclerotic changes. No aneurysm.  The major branch vessels are stable.  There are brachytherapy seeds in the prostate gland. No pelvic  mass or pelvic lymphadenopathy. The bladder is unremarkable. A few  small scattered stable external iliac nodes are noted. No inguinal  adenopathy.  Examination of the bony structures demonstrates no obvious lytic or  sclerotic bone lesions to suggest osseous metastasis.  IMPRESSION:  1. Unremarkable and stable CT appearance of the abdomen/pelvis.  No lymphadenopathy or pelvic mass.  2. No obvious osseous metastatic disease.  3. Stable renal cysts.  4. Cholelithiasis  Original Report Authenticated By: P. Loralie Champagne, M.D.   ASSESSMENT: 78 year old gentleman with: 1.  stage II colon cancer with no evidence of recurrence 2. Anemia of chronic disease and renal deficiency on aranesp injections 3. Patient with prostate cancer with a rising PSA which is now up to about 5. Clinically he is without any symptoms. He also does not have any evidence of metastatic disease by CT scans and bone scans.  PLAN:  1. I discussed the bone scan and CT results with the patient and his wife as well as his daughter. We also did discuss the previous PSA which was  about 4 as well.  #2 at this time since his most recent PSA from today is rise reason in comparison to previous month I do think that patient does have a  chemical relapse. I will pursue [employer] authorization. On his behalf   #3 I will plan on seeing him back in one month's time for followup   All questions were answered. The patient knows to call the clinic with any problems, questions or concerns. We can certainly see the patient much sooner if necessary.  I spent 20 minutes counseling the patient face to face. The total time spent in the appointment was 30 minutes.    Drue Second, MD Medical/Oncology [facility] [phone] (beeper) [phone] (Office)

## 2011-06-27 NOTE — Telephone Encounter (Signed)
na

## 2011-06-27 NOTE — Progress Notes (Signed)
Per MD , pt's labs reviewed. No aranesp to be given. Printed pt schedule for future appts. Cancelled pt's appt for 06/28/11 per MD. Pt is aware of changes and has copy of future appts.

## 2011-06-28 ENCOUNTER — Ambulatory Visit: Payer: Medicare Other

## 2011-06-28 ENCOUNTER — Other Ambulatory Visit: Payer: Medicare Other | Admitting: Lab

## 2011-06-29 ENCOUNTER — Ambulatory Visit (INDEPENDENT_AMBULATORY_CARE_PROVIDER_SITE_OTHER): Payer: Medicare Other | Admitting: *Deleted

## 2011-06-29 DIAGNOSIS — I4891 Unspecified atrial fibrillation: Secondary | ICD-10-CM

## 2011-06-29 LAB — POCT INR: INR: 2.2

## 2011-06-30 ENCOUNTER — Other Ambulatory Visit: Payer: Self-pay | Admitting: Cardiology

## 2011-07-02 NOTE — Telephone Encounter (Signed)
Refilled coumadin and amaryl

## 2011-07-03 ENCOUNTER — Telehealth: Payer: Self-pay | Admitting: Cardiology

## 2011-07-03 DIAGNOSIS — R0989 Other specified symptoms and signs involving the circulatory and respiratory systems: Secondary | ICD-10-CM

## 2011-07-03 MED ORDER — AZITHROMYCIN 250 MG PO TABS: ORAL_TABLET | ORAL | Status: AC

## 2011-07-03 NOTE — Telephone Encounter (Signed)
The patient may have bronchitis or he may have influenza.  We will treat him with Tamiflu 75 mg twice a day for 5 days and also treat him with a Z-Pak and continue Mucinex.  Use Tylenol for fever or chills

## 2011-07-03 NOTE — Telephone Encounter (Signed)
Denies any increase swelling or weight gain.  Wife stated she just asked him how he felt and he has on a heavy blanket and has a chill

## 2011-07-03 NOTE — Telephone Encounter (Signed)
Chest congestion but not coughing much.   Using Tussionex some and Mucinex.  Denies fever.  Not really coughing up any sputum.  Just doesn't feel well.  Stuffy nose, does have some blood when he blows his nose, per wife he just feels awful.

## 2011-07-03 NOTE — Telephone Encounter (Signed)
Advised wife  

## 2011-07-03 NOTE — Telephone Encounter (Signed)
Temp 98.4 this am and 98.4. Denies any aching and states he doesn't feel like he has the flu (per wife).  Will forward to  Dr. Patty Sermons for review

## 2011-07-03 NOTE — Telephone Encounter (Signed)
New Msg: Pt wife calling stating that pt is not feeling well at all since Sunday. Pt is wheezing and feeling tight in the chest and c/o a bad headache. Pt NOT c/o sob or chest pain.  Pt wife has been giving pt cough syrup with little to no relief.    Please return pt wife call to discuss further.

## 2011-07-03 NOTE — Telephone Encounter (Signed)
We can discontinue the order for the Tamiflu but proceed with the order for Z-Pak.

## 2011-07-24 ENCOUNTER — Encounter: Payer: Self-pay | Admitting: Cardiology

## 2011-07-24 ENCOUNTER — Other Ambulatory Visit (INDEPENDENT_AMBULATORY_CARE_PROVIDER_SITE_OTHER): Payer: Medicare Other

## 2011-07-24 ENCOUNTER — Ambulatory Visit (INDEPENDENT_AMBULATORY_CARE_PROVIDER_SITE_OTHER): Payer: Medicare Other | Admitting: Cardiology

## 2011-07-24 VITALS — BP 138/72 | HR 80 | Resp 18 | Ht 68.0 in | Wt 251.8 lb

## 2011-07-24 DIAGNOSIS — E7800 Pure hypercholesterolemia, unspecified: Secondary | ICD-10-CM

## 2011-07-24 DIAGNOSIS — C61 Malignant neoplasm of prostate: Secondary | ICD-10-CM

## 2011-07-24 DIAGNOSIS — R5381 Other malaise: Secondary | ICD-10-CM

## 2011-07-24 DIAGNOSIS — E119 Type 2 diabetes mellitus without complications: Secondary | ICD-10-CM

## 2011-07-24 DIAGNOSIS — I119 Hypertensive heart disease without heart failure: Secondary | ICD-10-CM

## 2011-07-24 DIAGNOSIS — I4891 Unspecified atrial fibrillation: Secondary | ICD-10-CM

## 2011-07-24 DIAGNOSIS — I2510 Atherosclerotic heart disease of native coronary artery without angina pectoris: Secondary | ICD-10-CM

## 2011-07-24 DIAGNOSIS — I4892 Unspecified atrial flutter: Secondary | ICD-10-CM

## 2011-07-24 LAB — BASIC METABOLIC PANEL
CO2: 25 mEq/L (ref 19–32)
Chloride: 99 mEq/L (ref 96–112)
Creatinine, Ser: 2.4 mg/dL — ABNORMAL HIGH (ref 0.4–1.5)
Potassium: 3.8 mEq/L (ref 3.5–5.1)

## 2011-07-24 LAB — LIPID PANEL
Cholesterol: 173 mg/dL (ref 0–200)
Triglycerides: 270 mg/dL — ABNORMAL HIGH (ref 0.0–149.0)

## 2011-07-24 LAB — HEPATIC FUNCTION PANEL
ALT: 14 U/L (ref 0–53)
AST: 13 U/L (ref 0–37)
Albumin: 3.6 g/dL (ref 3.5–5.2)
Total Protein: 7.1 g/dL (ref 6.0–8.3)

## 2011-07-24 LAB — CBC WITH DIFFERENTIAL/PLATELET
Basophils Relative: 0.4 % (ref 0.0–3.0)
Eosinophils Relative: 2.1 % (ref 0.0–5.0)
HCT: 33.3 % — ABNORMAL LOW (ref 39.0–52.0)
Hemoglobin: 11.3 g/dL — ABNORMAL LOW (ref 13.0–17.0)
Lymphs Abs: 0.8 10*3/uL (ref 0.7–4.0)
MCV: 94.5 fl (ref 78.0–100.0)
Monocytes Absolute: 0.7 10*3/uL (ref 0.1–1.0)
Neutro Abs: 6.7 10*3/uL (ref 1.4–7.7)
Platelets: 154 10*3/uL (ref 150.0–400.0)
RBC: 3.53 Mil/uL — ABNORMAL LOW (ref 4.22–5.81)
WBC: 8.3 10*3/uL (ref 4.5–10.5)

## 2011-07-24 NOTE — Patient Instructions (Signed)
Will obtain labs today and call you with the results (LP/BMET/HFP/A1C/CBC) Your physician recommends that you continue on your current medications as directed. Please refer to the Current Medication list given to you today. Your physician recommends that you schedule a follow-up appointment in: 3 months with fasting labs (LP/BMET/HFP/CBC)

## 2011-07-24 NOTE — Assessment & Plan Note (Signed)
The patient has a history of metastatic prostate cancer.  He is followed at [facility].  He has difficulty voiding and has to do self-catheterization twice a day at home.

## 2011-07-24 NOTE — Progress Notes (Signed)
Jeffery Gibson Date of Birth:  [birth year] [facility] [address] [place], [place]  [address] [phone]         Fax   [phone]  History of Present Illness: This pleasant 78 year old gentleman is seen for a three-month followup office visit.  He has a complex past medical history.  He has a history of ischemic heart disease and is status post CABG.  He has a history of paroxysmal atrial fibrillation with tachybradycardia syndrome.  He has a dual-chamber pacemaker in place.  It is a [employer] device implanted in 2007.  The patient has a history of diabetes mellitus.  He has a history of prostate cancer as well as a history of previous adenocarcinoma of the colon.  He is on chronic Coumadin.  He has chronic anemia and chronic renal insufficiency.  Since last visit he had a CT scan of his chest and a spot on his lung was discovered.  He is being followed by oncology and they will repeat a CT scan in 3 months.  It may be a scar.  The patient has known cholelithiasis.  2 weeks ago he had some right sided chest and abdominal discomfort radiating through to the back.  It lasted several hours and went away without specific therapy.  Patient is diabetic.  He is not having any hypoglycemic symptoms.  Current Outpatient Prescriptions  Medication Sig Dispense Refill  . AMARYL 1 MG tablet TAKE 1 TABLET DAILY.  90 each  3  . amiodarone (PACERONE) 200 MG tablet TAKE (1/2) TABLET DAILY.  45 tablet  4  . Ascorbic Acid (VITAMIN C PO) Take 1 tablet by mouth daily.        Marland Kitchen aspirin 81 MG tablet Take 81 mg by mouth daily.        . chlorpheniramine-HYDROcodone (TUSSIONEX) 10-8 MG/5ML LQCR Take 5 mLs by mouth every 12 (twelve) hours as needed.  120 mL  0  . Cholecalciferol (VITAMIN D) 2000 UNITS tablet Take 2,000 Units by mouth daily.        Marland Kitchen COUMADIN 5 MG tablet TAKE 1/2 TABLET ONCE DAILY OR AS DIRECTED.  45 each  3  . Darbepoetin Alfa-Albumin (ARANESP IJ) Inject as directed.        Marland Kitchen  FINASTERIDE PO Take 5 mg by mouth daily.        . hydrochlorothiazide (HYDRODIURIL) 25 MG tablet TAKE AS DIRECTED.  90 tablet  3  . HYDROCODONE-ACETAMINOPHEN PO Take by mouth. As directed       . ibuprofen (ADVIL,MOTRIN) 600 MG tablet 1 daily with food as needed  90 tablet  0  . metoprolol (LOPRESSOR) 50 MG tablet Take 1 tablet (50 mg total) by mouth 2 (two) times daily.  180 tablet  3  . MICARDIS 80 MG tablet TAKE 1 TABLET ONCE DAILY.  90 each  3  . Multiple Vitamin (MULTIVITAMIN) tablet Take 1 tablet by mouth daily.        . nitroGLYCERIN (NITROSTAT) 0.4 MG SL tablet Place 1 tablet (0.4 mg total) under the tongue every 5 (five) minutes as needed for chest pain.  100 tablet  3  . omeprazole (PRILOSEC) 20 MG capsule Take 20 mg by mouth at bedtime.        . rosuvastatin (CRESTOR) 10 MG tablet Take 10 mg by mouth at bedtime. 1/2 tablet         . Tamsulosin HCl (FLOMAX) 0.4 MG CAPS       . ursodiol (  ACTIGALL) 300 MG capsule Take 300 mg by mouth 2 (two) times daily.          Allergies  Allergen Reactions  . Altace Cough  . Bextra (Valdecoxib) Diarrhea  . Latex   . Metformin And Related Nausea Only  . Mevacor (Lovastatin) Nausea And Vomiting    Patient Active Problem List  Diagnoses  . PROSTATE CANCER  . ADENOMATOUS COLONIC POLYP  . DM  . HYPERLIPIDEMIA  . HYPERTENSION  . MYOCARDIAL INFARCTION  . CAD  . Atrial fibrillation  . BRADYCARDIA-TACHYCARDIA SYNDROME  . SUPRAVENTRICULAR TACHYCARDIA  . RENAL INSUFFICIENCY  . SYNCOPE  . Diabetes mellitus  . Anemia associated with chronic renal failure    History  Smoking status  . Former Smoker  . Types: Cigarettes  . Quit date: 05/22/1963  Smokeless tobacco  . Not on file    History  Alcohol Use No    Family History  Problem Relation Age of Onset  . Heart failure Father 77  . COPD    . Coronary artery disease    . Gallbladder disease Father   . Emphysema      Review of Systems: Constitutional: no fever chills  diaphoresis or fatigue or change in weight.  Head and neck: no hearing loss, no epistaxis, no photophobia or visual disturbance. Respiratory: No cough, shortness of breath or wheezing. Cardiovascular: No chest pain peripheral edema, palpitations. Gastrointestinal: No abdominal distention, no abdominal pain, no change in bowel habits hematochezia or melena. Genitourinary: No dysuria, no frequency, no urgency, no nocturia. Musculoskeletal:No arthralgias, no back pain, no gait disturbance or myalgias. Neurological: No dizziness, no headaches, no numbness, no seizures, no syncope, no weakness, no tremors. Hematologic: No lymphadenopathy, no easy bruising. Psychiatric: No confusion, no hallucinations, no sleep disturbance.    Physical Exam: Filed Vitals:   07/24/11 0952  BP: 138/72  Pulse: 80  Resp: 18   the general appearance reveals a well-developed moderately obese gentleman in no acute distress.  He is slightly pale.Pupils equal and reactive.   Extraocular Movements are full.  There is no scleral icterus.  The mouth and pharynx are normal.  The neck is supple.  The carotids reveal no bruits.  The jugular venous pressure is normal.  The thyroid is not enlarged.  There is no lymphadenopathy.  The chest is clear to percussion and auscultation. There are no rales or rhonchi. Expansion of the chest is symmetrical.  Heart reveals an irregular rhythm and no gallop or rub.The abdomen is soft and nontender. Bowel sounds are normal. The liver and spleen are not enlarged. There Are no abdominal masses. There are no bruits.  Extremities reveal trace edemaStrength is normal and symmetrical in all extremities.  There is no lateralizing weakness.  There are no sensory deficits.  The skin is warm and dry.  There is no rash.  EKG today shows atrial flutter with controlled ventricular response and occasional ventricular paced beats   Assessment / Plan: Continue same medication.  The patient must avoid   Fatty foods because of his known gallstones.  Recheck in 3 months for office visit lipid panel hepatic function panel basal metabolic panel and CBC

## 2011-07-24 NOTE — Assessment & Plan Note (Signed)
The patient is back in atrial flutter fibrillation today.  He was not aware that his heart was out of rhythm.  Ventricular response is normal.  He is on long-term Coumadin.

## 2011-07-24 NOTE — Assessment & Plan Note (Signed)
The patient has not been having any recurrent angina pectoris.  He has not had to take any sublingual nitroglycerin.  He does have exertional dyspnea which she attributes to being overweight.  He has not had any cough fever or chills.  He had had a recent chest cold which is improving.

## 2011-07-25 ENCOUNTER — Telehealth: Payer: Self-pay | Admitting: *Deleted

## 2011-07-25 NOTE — Telephone Encounter (Signed)
Advised wife labs

## 2011-07-25 NOTE — Telephone Encounter (Signed)
Message copied by Burnell Blanks on Wed Jul 25, 2011 12:28 PM ------      Message from: Cassell Clement      Created: Tue Jul 24, 2011  6:46 PM       Please report.  The labs are stable.  Continue same meds.  Continue careful diet.  The blood sugar and A1C are better.  Hgb 11.3 better. LFTs normal.  TG higher--try to lose more weight.

## 2011-08-02 ENCOUNTER — Other Ambulatory Visit (HOSPITAL_BASED_OUTPATIENT_CLINIC_OR_DEPARTMENT_OTHER): Payer: Medicare Other | Admitting: Lab

## 2011-08-02 ENCOUNTER — Ambulatory Visit: Payer: Medicare Other

## 2011-08-02 DIAGNOSIS — C61 Malignant neoplasm of prostate: Secondary | ICD-10-CM

## 2011-08-02 LAB — CBC WITH DIFFERENTIAL/PLATELET
Basophils Absolute: 0 10*3/uL (ref 0.0–0.1)
EOS%: 2.2 % (ref 0.0–7.0)
Eosinophils Absolute: 0.2 10*3/uL (ref 0.0–0.5)
HCT: 31.9 % — ABNORMAL LOW (ref 38.4–49.9)
HGB: 11 g/dL — ABNORMAL LOW (ref 13.0–17.1)
MCH: 32.2 pg (ref 27.2–33.4)
MCV: 93.5 fL (ref 79.3–98.0)
MONO%: 8.4 % (ref 0.0–14.0)
NEUT#: 5.3 10*3/uL (ref 1.5–6.5)
NEUT%: 75.8 % — ABNORMAL HIGH (ref 39.0–75.0)

## 2011-08-02 LAB — PSA, TOTAL AND FREE
PSA, Free Pct: 22 % — ABNORMAL LOW (ref >25)
PSA, Free: 1.29 ng/mL

## 2011-08-02 NOTE — Patient Instructions (Signed)
Pt's injection held today because h/h above cut-off.  Pt informed and discharged.  Pt and wife instructed to call for questions/concerns. shk

## 2011-08-10 ENCOUNTER — Ambulatory Visit (INDEPENDENT_AMBULATORY_CARE_PROVIDER_SITE_OTHER): Payer: Medicare Other | Admitting: *Deleted

## 2011-08-10 DIAGNOSIS — I4891 Unspecified atrial fibrillation: Secondary | ICD-10-CM

## 2011-08-14 ENCOUNTER — Encounter: Payer: Self-pay | Admitting: *Deleted

## 2011-08-14 NOTE — Progress Notes (Unsigned)
SPOUSE LEFT VM TO CANCEL LAB APPT. 08/23/11 D/T CONFLICT WITH ANOTHER MD APPT. PT DOES NOT WANT TO R/S AT THIS TIME BUT WILL RESUME REGULARLY SCHEDULED VISITS. PT KNOWS TO CALL THIS DESK IF HE BECOMES SYMPTOMATIC

## 2011-08-22 ENCOUNTER — Other Ambulatory Visit: Payer: Self-pay | Admitting: *Deleted

## 2011-08-23 ENCOUNTER — Other Ambulatory Visit: Payer: Medicare Other

## 2011-08-23 ENCOUNTER — Ambulatory Visit: Payer: Medicare Other

## 2011-08-30 ENCOUNTER — Encounter: Payer: Self-pay | Admitting: Oncology

## 2011-08-30 ENCOUNTER — Ambulatory Visit (HOSPITAL_BASED_OUTPATIENT_CLINIC_OR_DEPARTMENT_OTHER): Payer: Medicare Other | Admitting: Oncology

## 2011-08-30 ENCOUNTER — Other Ambulatory Visit: Payer: Medicare Other | Admitting: Lab

## 2011-08-30 VITALS — BP 125/74 | HR 90 | Temp 98.3°F | Ht 68.0 in | Wt 254.7 lb

## 2011-08-30 DIAGNOSIS — N039 Chronic nephritic syndrome with unspecified morphologic changes: Secondary | ICD-10-CM

## 2011-08-30 DIAGNOSIS — N189 Chronic kidney disease, unspecified: Secondary | ICD-10-CM

## 2011-08-30 DIAGNOSIS — C61 Malignant neoplasm of prostate: Secondary | ICD-10-CM

## 2011-08-30 DIAGNOSIS — D631 Anemia in chronic kidney disease: Secondary | ICD-10-CM

## 2011-08-30 LAB — CBC WITH DIFFERENTIAL/PLATELET
Basophils Absolute: 0.1 10*3/uL (ref 0.0–0.1)
EOS%: 2.1 % (ref 0.0–7.0)
HGB: 10.9 g/dL — ABNORMAL LOW (ref 13.0–17.1)
MCH: 32.1 pg (ref 27.2–33.4)
MCV: 93.6 fL (ref 79.3–98.0)
MONO%: 7 % (ref 0.0–14.0)
NEUT#: 6.6 10*3/uL — ABNORMAL HIGH (ref 1.5–6.5)
RBC: 3.39 10*6/uL — ABNORMAL LOW (ref 4.20–5.82)
RDW: 14.3 % (ref 11.0–14.6)
lymph#: 0.5 10*3/uL — ABNORMAL LOW (ref 0.9–3.3)
nRBC: 0 % (ref 0–0)

## 2011-08-30 LAB — PSA, TOTAL AND FREE
PSA, Free Pct: 25 % (ref >25)
PSA, Free: 1.37 ng/mL
PSA: 5.54 ng/mL — ABNORMAL HIGH (ref <=4.00)

## 2011-08-30 MED ORDER — DARBEPOETIN ALFA-POLYSORBATE 300 MCG/0.6ML IJ SOLN
300.0000 ug | Freq: Once | INTRAMUSCULAR | Status: AC
  Administered 2011-08-30: 300 ug via SUBCUTANEOUS
  Filled 2011-08-30: qty 0.6

## 2011-08-30 NOTE — Patient Instructions (Addendum)
1. Proceed with injection today.  2. Return in 1 month on 09/28/11 with labs and injection  3. We will get pre-authorization for provenge from your insurance  Results for Jeffery Gibson, Jeffery Gibson (MRN [id number]) as of 08/30/2011 11:41  Ref. Range 04/05/2008 13:08 10/04/2008 08:12 11/09/2008 14:23 08/25/2009 12:28 02/09/2010 11:56 05/29/2011 11:01 06/27/2011 09:38 08/02/2011 09:00  CEA Latest Range: 0.0-5.0 ng/mL 4.2 3.0   3.5     PSA Latest Range: <=4.00 ng/mL 0.29 1.33 0.57 0.64 1.18 4.31 (H) 5.17 (H) 5.75 (H)  PSA, Free No range found       1.02 1.29  PSA, Free Pct Latest Range: >25 %       20 (L) 22 (L)

## 2011-09-05 ENCOUNTER — Telehealth: Payer: Self-pay | Admitting: Oncology

## 2011-09-05 NOTE — Telephone Encounter (Signed)
S/w the pt's wife and she is aware of the appts on 09/28/2011 with dr Welton Flakes

## 2011-09-10 NOTE — Progress Notes (Signed)
OFFICE PROGRESS NOTE  CC: Dr. Broadus Kairos Hemal  Cassell Clement, MD, MD [address] [place] [address]  DIAGNOSIS: 78 year old gentleman with  #1 stage II adenocarcinoma of the colon originally diagnosed in July 2005 on observation.  #2 prostate cancer status post brachii therapy is currently receiving Lupron every 6 month is followed by Dr. Luane School at [facility].   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 male returns for followup visit. Overall he is doing well. Patient did have a PSA is performed and they are elevated at about 5. At this time we discussed the possibility of getting patient and room enrolled on the Provenge. All of the plans have been made. If he is not eligible for prevention then we will start him on a different medication. He was seen by Dr. Luane School at [facility] who recommended a different regimen than the Provenge. We'll continue to monitor patient's progress. I do think that since patient is asymptomatic that we have the time to be able to plan the proper treatment for him. Patient is also continuing to get Aranesp injections every 3 weeks. We are monitoring his hemoglobin quite closely. He is without any other complaints no aches pains no fevers chills night sweats. Remainder of the 10 point review of systems is negative.  MEDICAL HISTORY: Past Medical History  Diagnosis Date  . Cancer     prostate/on Lupron  . Ischemic heart disease 05/06/06    post CARG 05/06/06  . Paroxysmal atrial fibrillation   . Pacemaker 1997    dual-chamber/for tachybradycardia syndrome  . Obese     exogenous  . Adenocarcinoma of colon 11/2003    stage 2(T3,N0,M0)  . Renal insufficiency   . Anemia of chronic disease     aranesp injections  .  Dyslipidemia   . Coronary artery disease   . Anemia associated with chronic renal failure 04/05/2011    ALLERGIES:  is allergic to altace; bextra; latex; metformin and related; and mevacor.  MEDICATIONS:  Current Outpatient Prescriptions  Medication Sig Dispense Refill  . AMARYL 1 MG tablet TAKE 1 TABLET DAILY.  90 each  3  . amiodarone (PACERONE) 200 MG tablet TAKE (1/2) TABLET DAILY.  45 tablet  4  . Ascorbic Acid (VITAMIN C PO) Take 1 tablet by mouth daily.        . chlorpheniramine-HYDROcodone (TUSSIONEX) 10-8 MG/5ML LQCR Take 5 mLs by mouth every 12 (twelve) hours as needed.  120 mL  0  . Cholecalciferol (VITAMIN D) 2000 UNITS tablet Take 2,000 Units by mouth daily.        Marland Kitchen COUMADIN 5 MG tablet TAKE 1/2 TABLET ONCE DAILY OR AS DIRECTED.  45 each  3  . Darbepoetin Alfa-Albumin (ARANESP IJ) Inject as directed.        Marland Kitchen FINASTERIDE PO Take 5 mg by mouth daily.        . hydrochlorothiazide (HYDRODIURIL) 25 MG tablet TAKE AS DIRECTED.  90 tablet  3  . HYDROCODONE-ACETAMINOPHEN PO Take by mouth. As directed       . ibuprofen (ADVIL,MOTRIN) 600 MG tablet 1 daily with food as needed  90 tablet  0  . metoprolol (LOPRESSOR) 50 MG tablet Take 1 tablet (50 mg total) by mouth 2 (  two) times daily.  180 tablet  3  . MICARDIS 80 MG tablet TAKE 1 TABLET ONCE DAILY.  90 each  3  . Multiple Vitamin (MULTIVITAMIN) tablet Take 1 tablet by mouth daily.        . nitroGLYCERIN (NITROSTAT) 0.4 MG SL tablet Place 1 tablet (0.4 mg total) under the tongue every 5 (five) minutes as needed for chest pain.  100 tablet  3  . omeprazole (PRILOSEC) 20 MG capsule Take 20 mg by mouth at bedtime.        . rosuvastatin (CRESTOR) 10 MG tablet Take 10 mg by mouth at bedtime. 1/2 tablet         . aspirin 81 MG tablet Take 81 mg by mouth daily.        . Tamsulosin HCl (FLOMAX) 0.4 MG CAPS       . ursodiol (ACTIGALL) 300 MG capsule Take 300 mg by mouth 2 (two) times daily.          SURGICAL HISTORY:  Past Surgical  History  Procedure Date  . Laparotomy 12/04/2003    resection of rectosigmoid carcinoma/  . Radioactive seed implant 05/2005    transperianeal placement I-125 for prostate cancer/# of  seeds 55  . Cardiac catheterization 05/01/06    EF 40%/diffuse 3 vessel CAD/tight L antereior descending artery stenosis/diffuse disease proximal L anterior descending  . Coronary artery bypass graft 05/06/06    x4 with L internal mammary artery to the L anterior descending coronary artery  . Pacemaker removal 1997    REVIEW OF SYSTEMS:  A comprehensive review of systems was negative except for: Cardiovascular: positive for dyspnea and irregular heart beat Genitourinary: positive for urinary incontinence   PHYSICAL EXAMINATION: General appearance: alert, cooperative, mild distress, moderately obese and pale Neck: no adenopathy, no carotid bruit, no JVD, supple, symmetrical, trachea midline and thyroid not enlarged, symmetric, no tenderness/mass/nodules Resp: clear to auscultation bilaterally and normal percussion bilaterally Cardio: irregularly irregular rhythm GI: soft, non-tender; bowel sounds normal; no masses,  no organomegaly Extremities: extremities normal, atraumatic, no cyanosis or edema Neurologic: Alert and oriented X 3, normal strength and tone. Normal symmetric reflexes. Normal coordination and gait Sensory: normal Motor: grossly normal  ECOG PERFORMANCE STATUS: 1 - Symptomatic but completely ambulatory  Blood pressure 125/74, pulse 90, temperature 98.3 F (36.8 C), temperature source Oral, height 5\' 8"  (1.727 m), weight 254 lb 11.2 oz (115.531 kg).  LABORATORY DATA: Lab Results  Component Value Date   WBC 7.9 08/30/2011   HGB 10.9* 08/30/2011   HCT 31.7* 08/30/2011   MCV 93.6 08/30/2011   PLT 145 08/30/2011      Chemistry      Component Value Date/Time   NA 132* 07/24/2011 1037   NA 141 02/09/2010 1156   K 3.8 07/24/2011 1037   K 4.5 02/09/2010 1156   CL 99 07/24/2011 1037   CL 98  02/09/2010 1156   CO2 25 07/24/2011 1037   CO2 25 02/09/2010 1156   BUN 41* 07/24/2011 1037   BUN 36* 02/09/2010 1156   CREATININE 2.4* 07/24/2011 1037   CREATININE 1.7* 02/09/2010 1156      Component Value Date/Time   CALCIUM 9.0 07/24/2011 1037   CALCIUM 8.6 02/09/2010 1156   ALKPHOS 81 07/24/2011 1037   ALKPHOS 77 02/09/2010 1156   AST 13 07/24/2011 1037   AST 25 02/09/2010 1156   ALT 14 07/24/2011 1037   BILITOT 0.4 07/24/2011 1037   BILITOT 0.70 02/09/2010 1156  Results for HOBSON, LAX (MRN [id number]) as of 06/27/2011 18:10  Ref. Range 06/27/2011 09:38  PSA Latest Range: <=4.00 ng/mL 5.17 (H)  PSA, Free No range found 1.02  PSA, Free Pct Latest Range: >25 % 20 (L)     ASSESSMENT: 78 year old gentleman with:  #1 anemia of chronic disease.  #2 prostate cancer his PSA from 06/27/2011 was 5.17 slightly higher than previously. We will check another PSA on him.  PLAN:  #1 patient will proceed with his Aranesp injection today.  #2 we will start the process of getting Provenge approved by his insurance.  #3 I will followup on his PSA that was drawn today.  #4 patient will be seen back in one month's time or sooner if need arises.  All questions were answered. The patient knows to call the clinic with any problems, questions or concerns. We can certainly see the patient much sooner if necessary.  I spent 20 minutes counseling the patient face to face. The total time spent in the appointment was 30 minutes.    Drue Second, MD Medical/Oncology [facility] [phone] (beeper) [phone] (Office)

## 2011-09-13 ENCOUNTER — Ambulatory Visit: Payer: Medicare Other

## 2011-09-13 ENCOUNTER — Other Ambulatory Visit: Payer: Medicare Other | Admitting: Lab

## 2011-09-17 NOTE — Progress Notes (Signed)
08/30/11 I met with Jeffery Gibson and wife Jeffery Gibson.  I gave them the info on Provenge.  08/31/11 Fax from [facility] received the enrollment form.  09/07/11 They requested a secondary Dx. Code.  Rising PSA was all I had.  09/10/11 They said Medicare nor the supplement would cover it without a secondary Dx. Of metastatic disease somewhere in the body from his prostate cancer.  09/13/11 I spoke to [place] and explained without Metastatic disease they would be responsible for th [id number].  I told her it was really a good thing that he doesn't have disease anywhere else.  She understood.

## 2011-09-20 ENCOUNTER — Ambulatory Visit: Payer: Medicare Other

## 2011-09-20 ENCOUNTER — Other Ambulatory Visit: Payer: Medicare Other | Admitting: Lab

## 2011-09-21 ENCOUNTER — Ambulatory Visit (INDEPENDENT_AMBULATORY_CARE_PROVIDER_SITE_OTHER): Payer: Medicare Other | Admitting: Pharmacist

## 2011-09-21 DIAGNOSIS — I4891 Unspecified atrial fibrillation: Secondary | ICD-10-CM

## 2011-09-21 LAB — POCT INR: INR: 3.3

## 2011-09-27 ENCOUNTER — Other Ambulatory Visit: Payer: Self-pay | Admitting: Cardiology

## 2011-09-28 ENCOUNTER — Encounter: Payer: Self-pay | Admitting: Oncology

## 2011-09-28 ENCOUNTER — Ambulatory Visit (HOSPITAL_BASED_OUTPATIENT_CLINIC_OR_DEPARTMENT_OTHER): Payer: Medicare Other | Admitting: Oncology

## 2011-09-28 ENCOUNTER — Other Ambulatory Visit: Payer: Self-pay | Admitting: Cardiology

## 2011-09-28 ENCOUNTER — Other Ambulatory Visit (HOSPITAL_BASED_OUTPATIENT_CLINIC_OR_DEPARTMENT_OTHER): Payer: Medicare Other | Admitting: Lab

## 2011-09-28 VITALS — BP 139/76 | HR 92 | Temp 97.9°F | Ht 68.0 in | Wt 255.3 lb

## 2011-09-28 DIAGNOSIS — C61 Malignant neoplasm of prostate: Secondary | ICD-10-CM

## 2011-09-28 DIAGNOSIS — D638 Anemia in other chronic diseases classified elsewhere: Secondary | ICD-10-CM

## 2011-09-28 DIAGNOSIS — Z85038 Personal history of other malignant neoplasm of large intestine: Secondary | ICD-10-CM

## 2011-09-28 DIAGNOSIS — N289 Disorder of kidney and ureter, unspecified: Secondary | ICD-10-CM

## 2011-09-28 LAB — CBC WITH DIFFERENTIAL/PLATELET
BASO%: 1 % (ref 0.0–2.0)
Basophils Absolute: 0.1 10*3/uL (ref 0.0–0.1)
EOS%: 1.6 % (ref 0.0–7.0)
HGB: 11.5 g/dL — ABNORMAL LOW (ref 13.0–17.1)
MCH: 31 pg (ref 27.2–33.4)
MCHC: 33.6 g/dL (ref 32.0–36.0)
MCV: 92.2 fL (ref 79.3–98.0)
MONO%: 6.7 % (ref 0.0–14.0)
RBC: 3.7 10*6/uL — ABNORMAL LOW (ref 4.20–5.82)
RDW: 14.6 % (ref 11.0–14.6)
lymph#: 0.6 10*3/uL — ABNORMAL LOW (ref 0.9–3.3)
nRBC: 0 % (ref 0–0)

## 2011-09-28 LAB — LACTATE DEHYDROGENASE: LDH: 181 U/L (ref 94–250)

## 2011-09-28 LAB — COMPREHENSIVE METABOLIC PANEL
ALT: 8 U/L (ref 0–53)
BUN: 45 mg/dL — ABNORMAL HIGH (ref 6–23)
CO2: 25 mEq/L (ref 19–32)
Calcium: 8.8 mg/dL (ref 8.4–10.5)
Chloride: 100 mEq/L (ref 96–112)
Creatinine, Ser: 2.15 mg/dL — ABNORMAL HIGH (ref 0.50–1.35)
Glucose, Bld: 189 mg/dL — ABNORMAL HIGH (ref 70–99)
Total Bilirubin: 0.3 mg/dL (ref 0.3–1.2)

## 2011-09-28 LAB — PSA: PSA: 6.59 ng/mL — ABNORMAL HIGH (ref <=4.00)

## 2011-09-28 MED ORDER — PREDNISONE 5 MG PO TABS: 5.0000 mg | ORAL_TABLET | Freq: Two times a day (BID) | ORAL | Status: AC

## 2011-09-28 MED ORDER — ABIRATERONE ACETATE 250 MG PO TABS: 1000.0000 mg | ORAL_TABLET | Freq: Every day | ORAL | Status: DC

## 2011-09-28 NOTE — Progress Notes (Signed)
OFFICE PROGRESS NOTE  CC: Dr. Broadus Elford Hemal  Cassell Clement, MD, MD [address] [place] [address]  DIAGNOSIS: 78 year old gentleman with  #1 stage II adenocarcinoma of the colon originally diagnosed in July 2005 on observation.  #2 prostate cancer status post brachii therapy is currently receiving Lupron every 6 month is followed by Dr. Luane School at [facility]   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 male returns for followup visit. Overall he is feeling well. He has no aches or pains. No fevers or chills. Remainder of the 10 point review of systems is unremarkable.  MEDICAL HISTORY: Past Medical History  Diagnosis Date  . Cancer     prostate/on Lupron  . Ischemic heart disease 05/06/06    post CARG 05/06/06  . Paroxysmal atrial fibrillation   . Pacemaker 1997    dual-chamber/for tachybradycardia syndrome  . Obese     exogenous  . Adenocarcinoma of colon 11/2003    stage 2(T3,N0,M0)  . Renal insufficiency   . Anemia of chronic disease     aranesp injections  . Dyslipidemia   . Coronary artery disease   . Anemia associated with chronic renal failure 04/05/2011    ALLERGIES:  is allergic to bextra; latex; metformin and related; mevacor; and ramipril.  MEDICATIONS:  Current Outpatient Prescriptions  Medication Sig Dispense Refill  . AMARYL 1 MG tablet TAKE 1 TABLET DAILY.  90 each  3  . amiodarone (PACERONE) 200 MG tablet TAKE (1/2) TABLET DAILY.  45 tablet  4  . Ascorbic Acid (VITAMIN C PO) Take 1 tablet by mouth daily.        Marland Kitchen aspirin 81 MG tablet Take 81 mg by mouth daily.        . chlorpheniramine-HYDROcodone (TUSSIONEX) 10-8 MG/5ML LQCR Take 5 mLs by mouth every  12 (twelve) hours as needed.  120 mL  0  . Cholecalciferol (VITAMIN D) 2000 UNITS tablet Take 2,000 Units by mouth daily.        Marland Kitchen COUMADIN 5 MG tablet TAKE 1/2 TABLET ONCE DAILY OR AS DIRECTED.  45 each  3  . Darbepoetin Alfa-Albumin (ARANESP IJ) Inject as directed.        Marland Kitchen FINASTERIDE PO Take 5 mg by mouth daily.        . hydrochlorothiazide (HYDRODIURIL) 25 MG tablet TAKE AS DIRECTED.  90 tablet  3  . HYDROCODONE-ACETAMINOPHEN PO Take by mouth. As directed       . ibuprofen (ADVIL,MOTRIN) 600 MG tablet 1 daily with food as needed  90 tablet  0  . LOPRESSOR 50 MG tablet TAKE 1 TABLET TWICE DAILY.  60 each  11  . MICARDIS 80 MG tablet TAKE 1 TABLET ONCE DAILY.  90 each  3  . Multiple Vitamin (MULTIVITAMIN) tablet Take 1 tablet by mouth daily.        . nitroGLYCERIN (NITROSTAT) 0.4 MG SL tablet Place 1 tablet (0.4 mg total) under the tongue every 5 (five) minutes as needed for chest pain.  100 tablet  3  . PRILOSEC 20 MG capsule TAKE 1-2 CAPSULES DAILY.  180 each  3  . rosuvastatin (CRESTOR) 10 MG tablet Take 10 mg by mouth  at bedtime. 1/2 tablet         . Tamsulosin HCl (FLOMAX) 0.4 MG CAPS       . ursodiol (ACTIGALL) 300 MG capsule Take 300 mg by mouth 2 (two) times daily.        Marland Kitchen DISCONTD: metoprolol (LOPRESSOR) 50 MG tablet Take 1 tablet (50 mg total) by mouth 2 (two) times daily.  180 tablet  3    SURGICAL HISTORY:  Past Surgical History  Procedure Date  . Laparotomy 12/04/2003    resection of rectosigmoid carcinoma/  . Radioactive seed implant 05/2005    transperianeal placement I-125 for prostate cancer/# of  seeds 55  . Cardiac catheterization 05/01/06    EF 40%/diffuse 3 vessel CAD/tight L antereior descending artery stenosis/diffuse disease proximal L anterior descending  . Coronary artery bypass graft 05/06/06    x4 with L internal mammary artery to the L anterior descending coronary artery  . Pacemaker removal 1997    REVIEW OF SYSTEMS:  A comprehensive review of  systems was negative except for: Cardiovascular: positive for dyspnea and irregular heart beat Genitourinary: positive for urinary incontinence   PHYSICAL EXAMINATION: General appearance: alert, cooperative, mild distress, moderately obese and pale Neck: no adenopathy, no carotid bruit, no JVD, supple, symmetrical, trachea midline and thyroid not enlarged, symmetric, no tenderness/mass/nodules Resp: clear to auscultation bilaterally and normal percussion bilaterally Cardio: irregularly irregular rhythm GI: soft, non-tender; bowel sounds normal; no masses,  no organomegaly Extremities: extremities normal, atraumatic, no cyanosis or edema Neurologic: Alert and oriented X 3, normal strength and tone. Normal symmetric reflexes. Normal coordination and gait Sensory: normal Motor: grossly normal  ECOG PERFORMANCE STATUS: 1 - Symptomatic but completely ambulatory  Blood pressure 139/76, pulse 92, temperature 97.9 F (36.6 C), height 5\' 8"  (1.727 m), weight 255 lb 4.8 oz (115.803 kg).  LABORATORY DATA: Lab Results  Component Value Date   WBC 8.7 09/28/2011   HGB 11.5* 09/28/2011   HCT 34.1* 09/28/2011   MCV 92.2 09/28/2011   PLT 171 09/28/2011      Chemistry      Component Value Date/Time   NA 132* 07/24/2011 1037   NA 141 02/09/2010 1156   K 3.8 07/24/2011 1037   K 4.5 02/09/2010 1156   CL 99 07/24/2011 1037   CL 98 02/09/2010 1156   CO2 25 07/24/2011 1037   CO2 25 02/09/2010 1156   BUN 41* 07/24/2011 1037   BUN 36* 02/09/2010 1156   CREATININE 2.4* 07/24/2011 1037   CREATININE 1.7* 02/09/2010 1156      Component Value Date/Time   CALCIUM 9.0 07/24/2011 1037   CALCIUM 8.6 02/09/2010 1156   ALKPHOS 81 07/24/2011 1037   ALKPHOS 77 02/09/2010 1156   AST 13 07/24/2011 1037   AST 25 02/09/2010 1156   ALT 14 07/24/2011 1037   BILITOT 0.4 07/24/2011 1037   BILITOT 0.70 02/09/2010 1156       ASSESSMENT: 78 year old gentleman with:  #1 anemia of chronic disease.  #2 prostate cancer with a rising  PSA.  PLAN:  #1 Hold Aranesp injection today.  #2 Patient was not eligible for provenge since he has evidence of disease on his recent bone scan. We discussed treatment with abiraterone. Risks and benefits of the therapy were discussed. He will receive 1000mg  daily with prednisone 5 mg BID.  #3 I will followup on his PSA that was drawn today.  #4 patient will be seen back in 2-3 weeks time or sooner if need arises.  All questions were answered. The patient knows to call the clinic with any problems, questions or concerns. We can certainly see the patient much sooner if necessary.  I spent 20 minutes counseling the patient face to face. The total time spent in the appointment was 30 minutes.    Drue Second, MD Medical/Oncology [facility] [phone] (beeper) [phone] (Office)

## 2011-10-02 ENCOUNTER — Telehealth: Payer: Self-pay | Admitting: Medical Oncology

## 2011-10-02 ENCOUNTER — Telehealth: Payer: Self-pay | Admitting: Cardiology

## 2011-10-02 NOTE — Telephone Encounter (Signed)
New msg Pt's wife called about some side effects of zytiga that he takes. Please call

## 2011-10-02 NOTE — Telephone Encounter (Signed)
LMOVM per Dr. Welton Flakes, patients PSA was 6.59 and to proceed with new medication as recommended.  Instructed patient to call with any questions.

## 2011-10-02 NOTE — Telephone Encounter (Signed)
PSA is rising, 6.5 on 09/28/2011.  4 pills a day with prednisone twice a day. Many side effects, could raise blood sugar, effect heart, raise blood pressure and some others.  Drug is $6000 but was told medicare would cover it.  Not sure if he should try this or go ahead and get the orchiectomy.  Would like recommendation from  Dr. Patty Sermons, will forward for review

## 2011-10-02 NOTE — Telephone Encounter (Signed)
Message copied by Tylene Fantasia on Tue Oct 02, 2011  9:42 AM ------      Message from: Victorino December      Created: Tue Oct 02, 2011  9:17 AM       Please call patient: PSA was 6.59 proceed with taking the new medication as recommended

## 2011-10-02 NOTE — Telephone Encounter (Signed)
Advised wife  

## 2011-10-02 NOTE — Telephone Encounter (Signed)
It sounds to me like the orchiectomy might be a reasonable thing to do but I would have to defer to the urology experts on this.

## 2011-10-05 ENCOUNTER — Telehealth: Payer: Self-pay | Admitting: Medical Oncology

## 2011-10-05 NOTE — Telephone Encounter (Signed)
Noted, we discuss this at next appt.

## 2011-10-05 NOTE — Telephone Encounter (Signed)
Jeffery Gibson, spouse, called to inform this office that "patient is not taking Zytiga and would like to discuss surgical option."  Returned call to spouse to obtain more information.  Per spouse, "The pharmacy we use called and told me that they do not handle the Zytiga and that it would have to come from a specialty pharmacy and I have all the information on it, where it needs to go.  In the meantime, we have seen Jeffery Gibson's urologist and he mentioned that he could have the orchiectomy, the surgical castration, instead of taking the Zytiga.  We also talked about this with Jeffery Gibson's cardiologist and he said that since he had to take the prednisone with the medicine that that could cause some other problems since he has diabetes and other problems.  I just wanted to give Dr. Welton Flakes a heads up about this and we wanted to talk to her as to what she thought was the best option.  We also wanted to know if the surgery will produce the same effects as the medicine?"  Informed caller that I will pass these concerns and questions to Dr. Welton Flakes.  Spouse aware of next appointment on Tuesday 5/21 with Dr. Welton Flakes.  No further questions at this time.  Will review with MD

## 2011-10-09 ENCOUNTER — Ambulatory Visit (HOSPITAL_BASED_OUTPATIENT_CLINIC_OR_DEPARTMENT_OTHER): Payer: Medicare Other | Admitting: Oncology

## 2011-10-09 ENCOUNTER — Encounter: Payer: Self-pay | Admitting: Oncology

## 2011-10-09 ENCOUNTER — Telehealth: Payer: Self-pay | Admitting: Oncology

## 2011-10-09 ENCOUNTER — Other Ambulatory Visit (HOSPITAL_BASED_OUTPATIENT_CLINIC_OR_DEPARTMENT_OTHER): Payer: Medicare Other | Admitting: Lab

## 2011-10-09 VITALS — BP 133/77 | HR 65 | Temp 98.4°F | Ht 68.0 in | Wt 257.1 lb

## 2011-10-09 DIAGNOSIS — N19 Unspecified kidney failure: Secondary | ICD-10-CM

## 2011-10-09 DIAGNOSIS — C61 Malignant neoplasm of prostate: Secondary | ICD-10-CM

## 2011-10-09 DIAGNOSIS — D638 Anemia in other chronic diseases classified elsewhere: Secondary | ICD-10-CM

## 2011-10-09 DIAGNOSIS — N289 Disorder of kidney and ureter, unspecified: Secondary | ICD-10-CM

## 2011-10-09 LAB — CBC WITH DIFFERENTIAL/PLATELET
Basophils Absolute: 0.1 10*3/uL (ref 0.0–0.1)
EOS%: 2.1 % (ref 0.0–7.0)
HCT: 33.6 % — ABNORMAL LOW (ref 38.4–49.9)
HGB: 11.4 g/dL — ABNORMAL LOW (ref 13.0–17.1)
MCH: 31.4 pg (ref 27.2–33.4)
MONO#: 0.7 10*3/uL (ref 0.1–0.9)
NEUT#: 6.5 10*3/uL (ref 1.5–6.5)
NEUT%: 78.9 % — ABNORMAL HIGH (ref 39.0–75.0)
RDW: 14.4 % (ref 11.0–14.6)
WBC: 8.2 10*3/uL (ref 4.0–10.3)
lymph#: 0.8 10*3/uL — ABNORMAL LOW (ref 0.9–3.3)

## 2011-10-09 LAB — COMPREHENSIVE METABOLIC PANEL
AST: 9 U/L (ref 0–37)
Albumin: 3.8 g/dL (ref 3.5–5.2)
Alkaline Phosphatase: 94 U/L (ref 39–117)
BUN: 40 mg/dL — ABNORMAL HIGH (ref 6–23)
Calcium: 8.8 mg/dL (ref 8.4–10.5)
Chloride: 103 mEq/L (ref 96–112)
Glucose, Bld: 133 mg/dL — ABNORMAL HIGH (ref 70–99)
Potassium: 4.5 mEq/L (ref 3.5–5.3)
Sodium: 139 mEq/L (ref 135–145)
Total Protein: 6.5 g/dL (ref 6.0–8.3)

## 2011-10-09 NOTE — Telephone Encounter (Signed)
gve the pt his June,july 2013 appt calendar

## 2011-10-09 NOTE — Patient Instructions (Signed)
1. Proceed with Orchiectomy at [facility]  2. Continue to have labs and aranesp injections as scheduled  3. i will see you back on 12/13/11

## 2011-10-09 NOTE — Progress Notes (Signed)
OFFICE PROGRESS NOTE  CC: Dr. Broadus Loreto Hemal  Cassell Clement, MD, MD [address] [place] [address]  DIAGNOSIS: 78 year old gentleman with  #1 stage II adenocarcinoma of the colon originally diagnosed in July 2005 on observation.  #2 prostate cancer status post brachii therapy is currently receiving Lupron every 6 month is followed by Dr. Luane School at [facility].   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  INTERVAL HISTORY: Jeffery Gibson 78 y.o. male returns for followup visit. On his last visit we discussed Zytiga and a prescription was given to him. However up to date he has not taken it yet. Apparently the cost of the drug is at $6000. Patient has been reading the literature that I gave to him and he is quite concerned about the side effects. He was also seen by Dr. Luane School at [facility]. They have again discussed the possibility of doing an orchiectomy and he is here to discuss this with me.Clinically he is without any complaints. Patient's PSA has risen to over 6 now  MEDICAL HISTORY: Past Medical History  Diagnosis Date  . Cancer     prostate/on Lupron  . Ischemic heart disease 05/06/06    post CARG 05/06/06  . Paroxysmal atrial fibrillation   . Pacemaker 1997    dual-chamber/for tachybradycardia syndrome  . Obese     exogenous  . Adenocarcinoma of colon 11/2003    stage 2(T3,N0,M0)  . Renal insufficiency   . Anemia of chronic disease     aranesp injections  . Dyslipidemia   . Coronary artery disease   . Anemia associated with chronic renal failure 04/05/2011    ALLERGIES:  is allergic to bextra; latex; metformin and related; mevacor; and ramipril.  MEDICATIONS:  Current Outpatient  Prescriptions  Medication Sig Dispense Refill  . abiraterone Acetate (ZYTIGA) 250 MG tablet Take 4 tablets (1,000 mg total) by mouth daily. Take on an empty stomach 1 hour before or 2 hours after a meal  120 tablet  4  . AMARYL 1 MG tablet TAKE 1 TABLET DAILY.  90 each  3  . amiodarone (PACERONE) 200 MG tablet TAKE (1/2) TABLET DAILY.  45 tablet  4  . Ascorbic Acid (VITAMIN C PO) Take 1 tablet by mouth daily.        Marland Kitchen aspirin 81 MG tablet Take 81 mg by mouth daily.        . chlorpheniramine-HYDROcodone (TUSSIONEX) 10-8 MG/5ML LQCR Take 5 mLs by mouth every 12 (twelve) hours as needed.  120 mL  0  . Cholecalciferol (VITAMIN D) 2000 UNITS tablet Take 2,000 Units by mouth daily.        Marland Kitchen COUMADIN 5 MG tablet TAKE 1/2 TABLET ONCE DAILY OR AS DIRECTED.  45 each  3  . Darbepoetin Alfa-Albumin (ARANESP IJ) Inject as directed.        Marland Kitchen FINASTERIDE PO Take 5 mg by mouth daily.        . hydrochlorothiazide (HYDRODIURIL) 25 MG tablet TAKE AS DIRECTED.  90 tablet  3  . HYDROCODONE-ACETAMINOPHEN PO Take by mouth. As directed       . ibuprofen (ADVIL,MOTRIN) 600 MG tablet 1 daily with food as needed  90  tablet  0  . LOPRESSOR 50 MG tablet TAKE 1 TABLET TWICE DAILY.  60 each  11  . MICARDIS 80 MG tablet TAKE 1 TABLET ONCE DAILY.  90 each  3  . Multiple Vitamin (MULTIVITAMIN) tablet Take 1 tablet by mouth daily.        . nitroGLYCERIN (NITROSTAT) 0.4 MG SL tablet Place 1 tablet (0.4 mg total) under the tongue every 5 (five) minutes as needed for chest pain.  100 tablet  3  . predniSONE (DELTASONE) 5 MG tablet Take 1 tablet (5 mg total) by mouth 2 (two) times daily.  60 tablet  4  . PRILOSEC 20 MG capsule TAKE 1-2 CAPSULES DAILY.  180 each  3  . rosuvastatin (CRESTOR) 10 MG tablet Take 10 mg by mouth at bedtime. 1/2 tablet         . Tamsulosin HCl (FLOMAX) 0.4 MG CAPS       . ursodiol (ACTIGALL) 300 MG capsule Take 300 mg by mouth 2 (two) times daily.          SURGICAL HISTORY:  Past Surgical History    Procedure Date  . Laparotomy 12/04/2003    resection of rectosigmoid carcinoma/  . Radioactive seed implant 05/2005    transperianeal placement I-125 for prostate cancer/# of  seeds 55  . Cardiac catheterization 05/01/06    EF 40%/diffuse 3 vessel CAD/tight L antereior descending artery stenosis/diffuse disease proximal L anterior descending  . Coronary artery bypass graft 05/06/06    x4 with L internal mammary artery to the L anterior descending coronary artery  . Pacemaker removal 1997    REVIEW OF SYSTEMS:  A comprehensive review of systems was negative except for: Cardiovascular: positive for dyspnea and irregular heart beat Genitourinary: positive for urinary incontinence   PHYSICAL EXAMINATION: General appearance: alert, cooperative, mild distress, moderately obese and pale Neck: no adenopathy, no carotid bruit, no JVD, supple, symmetrical, trachea midline and thyroid not enlarged, symmetric, no tenderness/mass/nodules Resp: clear to auscultation bilaterally and normal percussion bilaterally Cardio: irregularly irregular rhythm GI: soft, non-tender; bowel sounds normal; no masses,  no organomegaly Extremities: extremities normal, atraumatic, no cyanosis or edema Neurologic: Alert and oriented X 3, normal strength and tone. Normal symmetric reflexes. Normal coordination and gait Sensory: normal Motor: grossly normal  ECOG PERFORMANCE STATUS: 1 - Symptomatic but completely ambulatory  Blood pressure 133/77, pulse 65, temperature 98.4 F (36.9 C), temperature source Oral, height 5\' 8"  (1.727 m), weight 257 lb 1.6 oz (116.62 kg).  LABORATORY DATA: Lab Results  Component Value Date   WBC 8.2 10/09/2011   HGB 11.4* 10/09/2011   HCT 33.6* 10/09/2011   MCV 92.6 10/09/2011   PLT 154 10/09/2011      Chemistry      Component Value Date/Time   NA 138 09/28/2011 1543   NA 141 02/09/2010 1156   K 4.2 09/28/2011 1543   K 4.5 02/09/2010 1156   CL 100 09/28/2011 1543   CL 98 02/09/2010  1156   CO2 25 09/28/2011 1543   CO2 25 02/09/2010 1156   BUN 45* 09/28/2011 1543   BUN 36* 02/09/2010 1156   CREATININE 2.15* 09/28/2011 1543   CREATININE 1.7* 02/09/2010 1156      Component Value Date/Time   CALCIUM 8.8 09/28/2011 1543   CALCIUM 8.6 02/09/2010 1156   ALKPHOS 103 09/28/2011 1543   ALKPHOS 77 02/09/2010 1156   AST 12 09/28/2011 1543   AST 25 02/09/2010 1156   ALT 8 09/28/2011 1543  BILITOT 0.3 09/28/2011 1543   BILITOT 0.70 02/09/2010 1156       ASSESSMENT: 78 year old gentleman with:  #1 anemia of chronic disease.  #2 prostate cancer with a rising PSA.  PLAN:   Patient and I and his wife had an extensive discussion today regarding pros and cons of doing the castration for the prostate cancer. I do think it sounds like a good idea at this time for him to proceed with getting bilateral orchiectomies. He overall feels comfortable with this decision. He will call his urologists office to set up an appointment. He will continue to receive Aranesp injections with last. I will plan on seeing him back in July.  All questions were answered. The patient knows to call the clinic with any problems, questions or concerns. We can certainly see the patient much sooner if necessary.  I spent 20 minutes counseling the patient face to face. The total time spent in the appointment was 30 minutes.    Drue Second, MD Medical/Oncology [facility] [phone] (beeper) [phone] (Office)

## 2011-10-12 ENCOUNTER — Ambulatory Visit (INDEPENDENT_AMBULATORY_CARE_PROVIDER_SITE_OTHER): Payer: Medicare Other | Admitting: Pharmacist

## 2011-10-12 DIAGNOSIS — I4891 Unspecified atrial fibrillation: Secondary | ICD-10-CM

## 2011-10-12 LAB — POCT INR: INR: 2.2

## 2011-10-16 ENCOUNTER — Telehealth: Payer: Self-pay | Admitting: *Deleted

## 2011-10-16 NOTE — Telephone Encounter (Signed)
Pt's wife called lmovm pt will have surgery (orchiectomy) on 06/21 at [facility] by Dr. Sheela Stack. Reviewed with MD. Returned pt call advised per MD this is a great decision as PSA was slightly higher 7.04 on last visit 10/09/11 .Previously  PSA -6.59  On 05/10. Pt's wife thanked [place] for the call back and will keep Dr. Welton Flakes informed if anything changes. Will review with MD

## 2011-10-17 ENCOUNTER — Encounter: Payer: Self-pay | Admitting: Internal Medicine

## 2011-10-17 ENCOUNTER — Ambulatory Visit (INDEPENDENT_AMBULATORY_CARE_PROVIDER_SITE_OTHER): Payer: Medicare Other | Admitting: Internal Medicine

## 2011-10-17 VITALS — BP 133/66 | HR 73 | Resp 18 | Ht 68.0 in | Wt 258.0 lb

## 2011-10-17 DIAGNOSIS — I495 Sick sinus syndrome: Secondary | ICD-10-CM

## 2011-10-17 DIAGNOSIS — I4891 Unspecified atrial fibrillation: Secondary | ICD-10-CM

## 2011-10-17 LAB — PACEMAKER DEVICE OBSERVATION
AL AMPLITUDE: 2.8 mv
AL IMPEDENCE PM: 309 Ohm
BAMS-0001: 175 {beats}/min
RV LEAD AMPLITUDE: 11.2 mv
RV LEAD IMPEDENCE PM: 316 Ohm
RV LEAD THRESHOLD: 1 V

## 2011-10-17 NOTE — Patient Instructions (Signed)
Your physician wants you to follow-up in: 12 months with Dr Jacquiline Doe will receive a reminder letter in the mail two months in advance. If you don't receive a letter, please call our office to schedule the follow-up appointment.   Remote monitoring is used to monitor your Pacemaker of ICD from home. This monitoring reduces the number of office visits required to check your device to one time per year. It allows [place] to keep an eye on the functioning of your device to ensure it is working properly. You are scheduled for a device check from home on 01/24/12. You may send your transmission at any time that day. If you have a wireless device, the transmission will be sent automatically. After your physician reviews your transmission, you will receive a postcard with your next transmission date.

## 2011-10-17 NOTE — Assessment & Plan Note (Signed)
Continue rate control and anticoagulation long term

## 2011-10-17 NOTE — Assessment & Plan Note (Signed)
Normal pacemaker function See Pace Art report No changes today  

## 2011-10-17 NOTE — Progress Notes (Signed)
PCP: Cassell Clement, MD, MD  Jeffery Gibson is a 78 y.o. male who presents today for routine electrophysiology followup.  He has had a pacemaker for syncope and SSS 03/27/1996. He reached ERI and underwent pulse generator replacement (MDT) by Dr Reyes Ivan 03/02/05.  Since last being seen in our clinic, the patient reports doing very well.  Today, he denies symptoms of palpitations, chest pain, shortness of breath,  lower extremity edema, dizziness, presyncope, or syncope.  The patient is otherwise without complaint today.   Past Medical History  Diagnosis Date  . Cancer     prostate/on Lupron  . Ischemic heart disease 05/06/06    post CARG 05/06/06  . Paroxysmal atrial fibrillation   . Pacemaker 1997    dual-chamber/for tachybradycardia syndrome  . Obese     exogenous  . Adenocarcinoma of colon 11/2003    stage 2(T3,N0,M0)  . Renal insufficiency   . Anemia of chronic disease     aranesp injections  . Dyslipidemia   . Coronary artery disease   . Anemia associated with chronic renal failure 04/05/2011   Past Surgical History  Procedure Date  . Laparotomy 12/04/2003    resection of rectosigmoid carcinoma/  . Radioactive seed implant 05/2005    transperianeal placement I-125 for prostate cancer/# of  seeds 55  . Cardiac catheterization 05/01/06    EF 40%/diffuse 3 vessel CAD/tight L antereior descending artery stenosis/diffuse disease proximal L anterior descending  . Coronary artery bypass graft 05/06/06    x4 with L internal mammary artery to the L anterior descending coronary artery  . Pacemaker removal 1997    Current Outpatient Prescriptions  Medication Sig Dispense Refill  . AMARYL 1 MG tablet TAKE 1 TABLET DAILY.  90 each  3  . amiodarone (PACERONE) 200 MG tablet TAKE (1/2) TABLET DAILY.  45 tablet  4  . Ascorbic Acid (VITAMIN C PO) Take 1 tablet by mouth daily.        Marland Kitchen aspirin 81 MG tablet Take 81 mg by mouth daily.        . chlorpheniramine-HYDROcodone (TUSSIONEX)  10-8 MG/5ML LQCR Take 5 mLs by mouth every 12 (twelve) hours as needed.  120 mL  0  . Cholecalciferol (VITAMIN D) 2000 UNITS tablet Take 2,000 Units by mouth daily.        Marland Kitchen COUMADIN 5 MG tablet TAKE 1/2 TABLET ONCE DAILY OR AS DIRECTED.  45 each  3  . Darbepoetin Alfa-Albumin (ARANESP IJ) Inject as directed.        Marland Kitchen FINASTERIDE PO Take 5 mg by mouth daily.        . hydrochlorothiazide (HYDRODIURIL) 25 MG tablet TAKE AS DIRECTED.  90 tablet  3  . ibuprofen (ADVIL,MOTRIN) 600 MG tablet 1 daily with food as needed  90 tablet  0  . LOPRESSOR 50 MG tablet TAKE 1 TABLET TWICE DAILY.  60 each  11  . MICARDIS 80 MG tablet TAKE 1 TABLET ONCE DAILY.  90 each  3  . Multiple Vitamin (MULTIVITAMIN) tablet Take 1 tablet by mouth daily.        Marland Kitchen PRILOSEC 20 MG capsule TAKE 1-2 CAPSULES DAILY.  180 each  3  . rosuvastatin (CRESTOR) 10 MG tablet Take 10 mg by mouth at bedtime. 1/2 tablet         . Tamsulosin HCl (FLOMAX) 0.4 MG CAPS       . ursodiol (ACTIGALL) 300 MG capsule Take 300 mg by mouth 2 (two) times daily.        Marland Kitchen  abiraterone Acetate (ZYTIGA) 250 MG tablet Take 4 tablets (1,000 mg total) by mouth daily. Take on an empty stomach 1 hour before or 2 hours after a meal  120 tablet  4  . nitroGLYCERIN (NITROSTAT) 0.4 MG SL tablet Place 1 tablet (0.4 mg total) under the tongue every 5 (five) minutes as needed for chest pain.  100 tablet  3    Physical Exam: Filed Vitals:   10/17/11 0919  BP: 133/66  Pulse: 73  Resp: 18  Height: 5\' 8"  (1.727 m)  Weight: 258 lb (117.028 kg)    GEN- The patient is well appearing, alert and oriented x 3 today.   Head- normocephalic, atraumatic Eyes-  Sclera clear, conjunctiva pink Ears- hearing intact Oropharynx- clear Lungs- Clear to ausculation bilaterally, normal work of breathing Chest- pacemaker pocket is well healed Heart- Regular rate and rhythm (paced) GI- soft, NT, ND, + BS Extremities- no clubbing, cyanosis, or edema  Pacemaker interrogation-  reviewed in detail today,  See PACEART report  Assessment and Plan:

## 2011-10-18 ENCOUNTER — Ambulatory Visit (HOSPITAL_BASED_OUTPATIENT_CLINIC_OR_DEPARTMENT_OTHER): Payer: Medicare Other

## 2011-10-18 ENCOUNTER — Other Ambulatory Visit (HOSPITAL_BASED_OUTPATIENT_CLINIC_OR_DEPARTMENT_OTHER): Payer: Medicare Other | Admitting: Lab

## 2011-10-18 VITALS — BP 129/73 | HR 91 | Temp 97.8°F

## 2011-10-18 DIAGNOSIS — D631 Anemia in chronic kidney disease: Secondary | ICD-10-CM

## 2011-10-18 DIAGNOSIS — C61 Malignant neoplasm of prostate: Secondary | ICD-10-CM

## 2011-10-18 DIAGNOSIS — N289 Disorder of kidney and ureter, unspecified: Secondary | ICD-10-CM

## 2011-10-18 DIAGNOSIS — D638 Anemia in other chronic diseases classified elsewhere: Secondary | ICD-10-CM

## 2011-10-18 LAB — CBC WITH DIFFERENTIAL/PLATELET
Basophils Absolute: 0 10*3/uL (ref 0.0–0.1)
EOS%: 2.7 % (ref 0.0–7.0)
Eosinophils Absolute: 0.2 10*3/uL (ref 0.0–0.5)
MCHC: 33.2 g/dL (ref 32.0–36.0)
MONO%: 9.1 % (ref 0.0–14.0)
NEUT#: 6.4 10*3/uL (ref 1.5–6.5)
NEUT%: 79 % — ABNORMAL HIGH (ref 39.0–75.0)
Platelets: 148 10*3/uL (ref 140–400)
RBC: 3.5 10*6/uL — ABNORMAL LOW (ref 4.20–5.82)
WBC: 8.1 10*3/uL (ref 4.0–10.3)
lymph#: 0.7 10*3/uL — ABNORMAL LOW (ref 0.9–3.3)

## 2011-10-18 MED ORDER — DARBEPOETIN ALFA-POLYSORBATE 300 MCG/0.6ML IJ SOLN
300.0000 ug | Freq: Once | INTRAMUSCULAR | Status: AC
  Administered 2011-10-18: 300 ug via SUBCUTANEOUS
  Filled 2011-10-18: qty 0.6

## 2011-10-22 ENCOUNTER — Telehealth: Payer: Self-pay | Admitting: *Deleted

## 2011-10-22 ENCOUNTER — Encounter: Payer: Self-pay | Admitting: Cardiology

## 2011-10-22 ENCOUNTER — Ambulatory Visit (INDEPENDENT_AMBULATORY_CARE_PROVIDER_SITE_OTHER): Payer: Medicare Other | Admitting: Cardiology

## 2011-10-22 ENCOUNTER — Other Ambulatory Visit (INDEPENDENT_AMBULATORY_CARE_PROVIDER_SITE_OTHER): Payer: Medicare Other

## 2011-10-22 VITALS — BP 122/78 | HR 74 | Ht 68.0 in | Wt 243.0 lb

## 2011-10-22 DIAGNOSIS — R0609 Other forms of dyspnea: Secondary | ICD-10-CM

## 2011-10-22 DIAGNOSIS — R0600 Dyspnea, unspecified: Secondary | ICD-10-CM | POA: Insufficient documentation

## 2011-10-22 DIAGNOSIS — I2510 Atherosclerotic heart disease of native coronary artery without angina pectoris: Secondary | ICD-10-CM

## 2011-10-22 DIAGNOSIS — I4891 Unspecified atrial fibrillation: Secondary | ICD-10-CM

## 2011-10-22 DIAGNOSIS — E1129 Type 2 diabetes mellitus with other diabetic kidney complication: Secondary | ICD-10-CM

## 2011-10-22 DIAGNOSIS — R0989 Other specified symptoms and signs involving the circulatory and respiratory systems: Secondary | ICD-10-CM

## 2011-10-22 DIAGNOSIS — N058 Unspecified nephritic syndrome with other morphologic changes: Secondary | ICD-10-CM

## 2011-10-22 LAB — HEPATIC FUNCTION PANEL
ALT: 12 U/L (ref 0–53)
AST: 15 U/L (ref 0–37)
Albumin: 3.5 g/dL (ref 3.5–5.2)
Total Protein: 7.1 g/dL (ref 6.0–8.3)

## 2011-10-22 LAB — CBC WITH DIFFERENTIAL/PLATELET
Basophils Absolute: 0 10*3/uL (ref 0.0–0.1)
Eosinophils Absolute: 0.2 10*3/uL (ref 0.0–0.7)
Eosinophils Relative: 2 % (ref 0.0–5.0)
Lymphs Abs: 0.6 10*3/uL — ABNORMAL LOW (ref 0.7–4.0)
MCV: 93.8 fl (ref 78.0–100.0)
Monocytes Absolute: 0.7 10*3/uL (ref 0.1–1.0)
Neutrophils Relative %: 82.7 % — ABNORMAL HIGH (ref 43.0–77.0)
Platelets: 159 10*3/uL (ref 150.0–400.0)
RDW: 14.7 % — ABNORMAL HIGH (ref 11.5–14.6)
WBC: 8.1 10*3/uL (ref 4.5–10.5)

## 2011-10-22 LAB — LIPID PANEL
Cholesterol: 141 mg/dL (ref 0–200)
LDL Cholesterol: 66 mg/dL (ref 0–99)
Triglycerides: 166 mg/dL — ABNORMAL HIGH (ref 0.0–149.0)

## 2011-10-22 LAB — BASIC METABOLIC PANEL
BUN: 32 mg/dL — ABNORMAL HIGH (ref 6–23)
Chloride: 102 mEq/L (ref 96–112)
Creatinine, Ser: 2.1 mg/dL — ABNORMAL HIGH (ref 0.4–1.5)
Glucose, Bld: 138 mg/dL — ABNORMAL HIGH (ref 70–99)

## 2011-10-22 MED ORDER — ROSUVASTATIN CALCIUM 10 MG PO TABS: ORAL_TABLET | ORAL | Status: DC

## 2011-10-22 NOTE — Patient Instructions (Signed)
Your physician recommends that you schedule a follow-up appointment in: 3 months   Your physician recommends that you return for a FASTING lipid profile: today bmet lipid hepatic cbc-d  Your physician recommends that you return for a FASTING lipid profile: 3 months lipid bmet A1C, hepatic, cbc-d,  Needs ekg,

## 2011-10-22 NOTE — Progress Notes (Signed)
Jeffery Gibson Date of Birth:  1929/04/21 [facility] [address] [place], [place]  [address] [phone]         Fax   [phone]  History of Present Illness: This pleasant 78 year old gentleman is seen for a scheduled followup office visit.  He has a past history of ischemic heart disease and is status post CABG.  He has a history of paroxysmal atrial fibrillation with tachybradycardia syndrome and a dual-chamber pacemaker in place.  He was seen in the pacemaker clinic on 10/17/11 and was in atrial fibrillation at that time.  He is on long-term Coumadin.  He has a Medtronic pacemaker.  Patient also has a history of prostate cancer and is scheduled for orchiectomy on 11/09/11 at [facility].  His PSA has been steadily rising.  As a history of hypercholesterolemia, diabetes, cholelithiasis treated medically, and chronic renal insufficiency.  He said prior adenocarcinoma of the colon as well as prostate cancer.  Current Outpatient Prescriptions  Medication Sig Dispense Refill  . abiraterone Acetate (ZYTIGA) 250 MG tablet Take 4 tablets (1,000 mg total) by mouth daily. Take on an empty stomach 1 hour before or 2 hours after a meal  120 tablet  4  . AMARYL 1 MG tablet TAKE 1 TABLET DAILY.  90 each  3  . amiodarone (PACERONE) 200 MG tablet TAKE (1/2) TABLET DAILY.  45 tablet  4  . Ascorbic Acid (VITAMIN C PO) Take 1 tablet by mouth daily.        Marland Kitchen aspirin 81 MG tablet Take 81 mg by mouth daily.        . chlorpheniramine-HYDROcodone (TUSSIONEX) 10-8 MG/5ML LQCR Take 5 mLs by mouth every 12 (twelve) hours as needed.  120 mL  0  . Cholecalciferol (VITAMIN D) 2000 UNITS tablet Take 2,000 Units by mouth daily.        Marland Kitchen COUMADIN 5 MG tablet TAKE 1/2 TABLET ONCE DAILY OR AS DIRECTED.  45 each  3  . Darbepoetin Alfa-Albumin (ARANESP IJ) Inject as directed.        Marland Kitchen FINASTERIDE PO Take 5 mg by mouth daily.        . hydrochlorothiazide (HYDRODIURIL) 25 MG tablet       . ibuprofen  (ADVIL,MOTRIN) 600 MG tablet 1 daily with food as needed  90 tablet  0  . LOPRESSOR 50 MG tablet TAKE 1 TABLET TWICE DAILY.  60 each  11  . MICARDIS 80 MG tablet TAKE 1 TABLET ONCE DAILY.  90 each  3  . Multiple Vitamin (MULTIVITAMIN) tablet Take 1 tablet by mouth daily.        Marland Kitchen PRILOSEC 20 MG capsule TAKE 1-2 CAPSULES DAILY.  180 each  3  . rosuvastatin (CRESTOR) 10 MG tablet 1/2 tablet Monday Wednesday and friday      . Tamsulosin HCl (FLOMAX) 0.4 MG CAPS       . ursodiol (ACTIGALL) 300 MG capsule Take 300 mg by mouth 2 (two) times daily.        Marland Kitchen DISCONTD: hydrochlorothiazide (HYDRODIURIL) 25 MG tablet TAKE AS DIRECTED.  90 tablet  3  . DISCONTD: rosuvastatin (CRESTOR) 10 MG tablet Take 10 mg by mouth at bedtime. 1/2 tablet         . nitroGLYCERIN (NITROSTAT) 0.4 MG SL tablet Place 1 tablet (0.4 mg total) under the tongue every 5 (five) minutes as needed for chest pain.  100 tablet  3    Allergies  Allergen Reactions  . Bextra (  Valdecoxib) Diarrhea  . Latex   . Metformin And Related Nausea Only  . Mevacor (Lovastatin) Nausea And Vomiting  . Ramipril Cough    Patient Active Problem List  Diagnoses  . PROSTATE CANCER  . ADENOMATOUS COLONIC POLYP  . DM  . HYPERLIPIDEMIA  . HYPERTENSION  . MYOCARDIAL INFARCTION  . CAD  . Atrial fibrillation  . BRADYCARDIA-TACHYCARDIA SYNDROME  . SUPRAVENTRICULAR TACHYCARDIA  . RENAL INSUFFICIENCY  . SYNCOPE  . Diabetes mellitus  . Anemia associated with chronic renal failure    History  Smoking status  . Former Smoker  . Types: Cigarettes  . Quit date: 05/22/1963  Smokeless tobacco  . Not on file    History  Alcohol Use No    Family History  Problem Relation Age of Onset  . Heart failure Father 68  . COPD    . Coronary artery disease    . Gallbladder disease Father   . Emphysema      Review of Systems: Constitutional: no fever chills diaphoresis or fatigue or change in weight.  Head and neck: no hearing loss, no  epistaxis, no photophobia or visual disturbance. Respiratory: No cough, shortness of breath or wheezing. Cardiovascular: No chest pain peripheral edema, palpitations. Gastrointestinal: No abdominal distention, no abdominal pain, no change in bowel habits hematochezia or melena. Genitourinary: No dysuria, no frequency, no urgency, no nocturia. Musculoskeletal:No arthralgias, no back pain, no gait disturbance or myalgias. Neurological: No dizziness, no headaches, no numbness, no seizures, no syncope, no weakness, no tremors. Hematologic: No lymphadenopathy, no easy bruising. Psychiatric: No confusion, no hallucinations, no sleep disturbance.    Physical Exam: Filed Vitals:   10/22/11 0910  BP: 122/78  Pulse: 74   the general appearance reveals a well-developed well-nourished obese gentleman in no distress.  He is slightly pale.Pupils equal and reactive.   Extraocular Movements are full.  There is no scleral icterus.  The mouth and pharynx are normal.  The neck is supple.  The carotids reveal no bruits.  The jugular venous pressure is normal.  The thyroid is not enlarged.  There is no lymphadenopathy.  The chest is clear to percussion and auscultation. There are no rales or rhonchi. Expansion of the chest is symmetrical.  Heart reveals no murmur gallop rub or click the rhythm is regular with pacemaker. Abdomen is obese and nontender.  Extremities show no pitting.Strength is normal and symmetrical in all extremities.  There is no lateralizing weakness.  There are no sensory deficits.     Assessment / Plan: Continue on same medication.  Recheck in 3 months for office visit EKG lipid panel hepatic function panel and basal metabolic panel CBC and hemoglobin A1c

## 2011-10-22 NOTE — Assessment & Plan Note (Signed)
The patient has not been having any chest pains or recurrent angina pectoris

## 2011-10-22 NOTE — Progress Notes (Signed)
Quick Note:  Please report to patient. The recent labs are stable. Lipids are better. Hgb stable. Continue same medication and careful diet. ______

## 2011-10-22 NOTE — Assessment & Plan Note (Signed)
The patient has exertional dyspnea.  He is not having any paroxysmal nocturnal dyspnea.  He sleeps on one and a half pillows.  He has not been expressing any peripheral edema.  He remains significantly obese and this is contributing significantly to his exertional dyspnea

## 2011-10-22 NOTE — Assessment & Plan Note (Signed)
Patient is not aware of any palpitations or tachycardia.  He has an underlying pacemaker and has underlying atrial fibrillation at this time.  His atrial fibrillation is paroxysmal and he remains on low-dose amiodarone.  He has not had any TIA symptoms and remains on warfarin.  For his upcoming orchiectomy he will probably be treated with local anesthesia and we may want to stop his warfarin for just 3 days without bridging.

## 2011-10-22 NOTE — Telephone Encounter (Signed)
Carmela Hurt, RN 10/22/2011 10:04 AM Incomplete  Patients wife came over to coumadin clinic, pt is having procedure 11/09/2011 so we did cancel his coumadin clinic appt, she states per Dr Patty Sermons depending on bleeding from orchiectomy surgery pt may need to hold coumadin for 5 days with lovenox bridging or 3 days without lovenox injections, wife is going to talk with MD about this 10/25/2011 appt, she will call and let us know and from then we can decide on when he will need to be seen in clinic. She states he has had lovenox injections before.

## 2011-10-25 ENCOUNTER — Encounter: Payer: Self-pay | Admitting: Pharmacist

## 2011-10-25 NOTE — Telephone Encounter (Signed)
Trixie Rude, NP from [facility] [phone]) called to inform us that Jeffery Gibson has an orchiectomy w/ spinal anesthesia scheduled for June 20th.  Patient will hold coumadin x5 days and bridge with Lovenox.  Called wife and set up an appointment to discuss bridging plan on 6/13.    6/15 - Last dose of coumadin in PM 6/16 - No coumadin or Lovenox 6/17 - Start Lovenox 120mg  SQ q12h 6/20 - Last dose of Lovenox in AM 6/21 - Day of procedure  Wait 24 hours after end of spinal anesthesia before restarting Lovenox.

## 2011-10-25 NOTE — Telephone Encounter (Signed)
This encounter was created in error - please disregard.

## 2011-10-26 ENCOUNTER — Telehealth: Payer: Self-pay | Admitting: Cardiology

## 2011-10-26 DIAGNOSIS — R05 Cough: Secondary | ICD-10-CM

## 2011-10-26 MED ORDER — AMOXICILLIN 500 MG PO CAPS
500.0000 mg | ORAL_CAPSULE | Freq: Three times a day (TID) | ORAL | Status: AC
Start: 2011-10-26 — End: 2011-11-05

## 2011-10-26 MED ORDER — HYDROCOD POLST-CHLORPHEN POLST 10-8 MG/5ML PO LQCR: 5.0000 mL | Freq: Two times a day (BID) | ORAL | Status: DC | PRN

## 2011-10-26 NOTE — Telephone Encounter (Signed)
New msg Pt's wife called about bad cough and cold he has. She wants to get antibiotic. He is to have surgery on [id number]

## 2011-10-26 NOTE — Telephone Encounter (Signed)
Amoxicillin 500 mg 3 times a day #30

## 2011-10-26 NOTE — Telephone Encounter (Signed)
Will forward to  Dr. Brackbill for review 

## 2011-10-26 NOTE — Telephone Encounter (Signed)
Advised wife, patient is coughing a lot and unable to rest at night.  Discussed with  Dr. Patty Sermons and will also Rx Tussionex 4 oz this time only

## 2011-10-31 ENCOUNTER — Telehealth: Payer: Self-pay | Admitting: Cardiology

## 2011-10-31 NOTE — Telephone Encounter (Signed)
Called and spoke with patients wife.  She stated patient not doing very well.  I advised  Dr. Patty Sermons at the hospital this afternoon and he needed to be evaluated.  Advised to go to [facility] today since he refused to go to hospital last night per wife.  Advised son he as well

## 2011-10-31 NOTE — Telephone Encounter (Signed)
Pt's son calling re thinking pt has or is getting pneumonia , wants to know if someone can check his breathing while here for labs

## 2011-10-31 NOTE — Telephone Encounter (Signed)
Agree with advice given

## 2011-11-01 ENCOUNTER — Ambulatory Visit (INDEPENDENT_AMBULATORY_CARE_PROVIDER_SITE_OTHER): Payer: Medicare Other | Admitting: *Deleted

## 2011-11-01 ENCOUNTER — Ambulatory Visit (INDEPENDENT_AMBULATORY_CARE_PROVIDER_SITE_OTHER): Payer: Medicare Other | Admitting: Family Medicine

## 2011-11-01 ENCOUNTER — Ambulatory Visit: Payer: Medicare Other

## 2011-11-01 VITALS — BP 130/71 | HR 83 | Temp 97.6°F | Resp 18 | Ht 67.0 in | Wt 254.0 lb

## 2011-11-01 DIAGNOSIS — I4891 Unspecified atrial fibrillation: Secondary | ICD-10-CM

## 2011-11-01 DIAGNOSIS — R05 Cough: Secondary | ICD-10-CM

## 2011-11-01 DIAGNOSIS — I10 Essential (primary) hypertension: Secondary | ICD-10-CM

## 2011-11-01 DIAGNOSIS — R059 Cough, unspecified: Secondary | ICD-10-CM

## 2011-11-01 DIAGNOSIS — C61 Malignant neoplasm of prostate: Secondary | ICD-10-CM

## 2011-11-01 DIAGNOSIS — E119 Type 2 diabetes mellitus without complications: Secondary | ICD-10-CM

## 2011-11-01 LAB — POCT CBC
Granulocyte percent: 80.5 %G — AB (ref 37–80)
HCT, POC: 37.5 % — AB (ref 43.5–53.7)
Hemoglobin: 11.9 g/dL — AB (ref 14.1–18.1)
MCV: 89.8 fL (ref 80–97)
RBC: 4.18 M/uL — AB (ref 4.69–6.13)

## 2011-11-01 LAB — POCT INR: INR: 3

## 2011-11-01 MED ORDER — IPRATROPIUM BROMIDE HFA 17 MCG/ACT IN AERS: 2.0000 | INHALATION_SPRAY | Freq: Three times a day (TID) | RESPIRATORY_TRACT | Status: DC

## 2011-11-01 MED ORDER — BENZONATATE 100 MG PO CAPS: ORAL_CAPSULE | ORAL | Status: DC

## 2011-11-01 MED ORDER — ENOXAPARIN SODIUM 120 MG/0.8ML ~~LOC~~ SOLN: 120.0000 mg | Freq: Two times a day (BID) | SUBCUTANEOUS | Status: DC

## 2011-11-01 MED ORDER — CEFDINIR 300 MG PO CAPS: 300.0000 mg | ORAL_CAPSULE | Freq: Two times a day (BID) | ORAL | Status: DC

## 2011-11-01 NOTE — Patient Instructions (Addendum)
Drink plenty of fluids.  Return if worse.  It is my opinion that you should be fine for surgery. However if you get feeling worse between now and then that can change.

## 2011-11-01 NOTE — Progress Notes (Signed)
Subjective: 78 year old man who has a history of prostate cancer. He has had a stent a rising PSA. Due to his heart he was unable to take some of the possible chemotherapeutic agents. It was decided that he needs to undergo an orchiectomy, and that is scheduled for June 21. He'll be done at [facility], where he sees a Dr. Luane School.  Hearing [facility] he goes to Dr. Patty Sermons for his cardiologist. He has previously been his primary care doctor also, though he has recently started just doing the cardiology. He has not worked out other primary care needs. He sees Dr. Lennette Bihari for oncology. He was sent by Dr. Yevonne Pax office over here to see Dr. Cleta Alberts because he keeps having a cough. It is been going on for a couple of weeks. Friday the sixth he was placed on amoxicillin. It is improved some but is still coughing a lot. He is not having a lot of productivity with a cough. He does have some rhinorrhea when he eats. He feels like he coughs most when he is sitting.  Objective: Overweight elderly gentleman in no severe distress at this time. He does walk with a cane. He is hearing is decreased he wears hearing aids. Throat clear. Wears one dental plate and has had a lot of dental work with H. Neck Supple without Significant Nodes. No JVD. Chest Clear to Auscultation. Heart Regular without Murmurs. Has a Pacemaker Right Upper Chest Wall. No Ankle Edema. I Cannot Hear Any Wheezing in His Lungs That He Says He Has Had Some Wheezing. He Is in Atrial Fibrillation, and Though It Sounded Regular When I Was Listening to It, When I Palpate His Pulse Again Feel the Irregularity.  Assessment: Cough Recurrent Prostate Cancer History of Coronary Artery Disease History Diabetes Mellitus  Plan: Check chest x-ray and a couple of labs on disease preop  Results for orders placed in visit on 11/01/11  POCT CBC      Component Value Range   WBC 8.6  4.6 - 10.2 K/uL   Lymph, poc 1.1  0.6 - 3.4   POC LYMPH PERCENT 12.7  10 - 50  %L   MID (cbc) 0.6  0 - 0.9   POC MID % 6.8  0 - 12 %M   POC Granulocyte 6.9  2 - 6.9   Granulocyte percent 80.5 (*) 37 - 80 %G   RBC 4.18 (*) 4.69 - 6.13 M/uL   Hemoglobin 11.9 (*) 14.1 - 18.1 g/dL   HCT, POC 16.1 (*) 09.6 - 53.7 %   MCV 89.8  80 - 97 fL   MCH, POC 28.5  27 - 31.2 pg   MCHC 31.7 (*) 31.8 - 35.4 g/dL   RDW, POC 04.5     Platelet Count, POC 231  142 - 424 K/uL   MPV 8.7  0 - 99.8 fL  GLUCOSE, POCT (MANUAL RESULT ENTRY)      Component Value Range   POC Glucose 126 (*) 70 - 99 mg/dl   [facility] reading (PRIMARY) by  Dr. Alwyn Ren CXR shows pacemaker, old cabg thoracotomy wires, some atalectasis lll.  No acute infiltrate but posterior border of heart a little shaggy looking.  Probably no acute problem.. No evidence of metastatic prostate cancer  His atrial fibrillation [place] from using quinolones or macrolides. He is on Coumadin appear size and mediastinal for surgery unless he gets worse between now and then. We'll review with the radiologist says about his chest x-ray. Will try to suppress his  cough and see if he gets doing better. I do not know if the Atrovent will help decrease it, but I thought that maybe trying to secretions a little helped. Return if worse.

## 2011-11-05 ENCOUNTER — Ambulatory Visit: Payer: Medicare Other

## 2011-11-05 ENCOUNTER — Ambulatory Visit (INDEPENDENT_AMBULATORY_CARE_PROVIDER_SITE_OTHER): Payer: Medicare Other | Admitting: Family Medicine

## 2011-11-05 VITALS — BP 118/65 | HR 85 | Temp 97.8°F | Resp 16 | Ht 67.0 in | Wt 255.4 lb

## 2011-11-05 DIAGNOSIS — R9389 Abnormal findings on diagnostic imaging of other specified body structures: Secondary | ICD-10-CM

## 2011-11-05 DIAGNOSIS — J189 Pneumonia, unspecified organism: Secondary | ICD-10-CM

## 2011-11-05 DIAGNOSIS — R918 Other nonspecific abnormal finding of lung field: Secondary | ICD-10-CM

## 2011-11-05 LAB — POCT CBC
Hemoglobin: 11.1 g/dL — AB (ref 14.1–18.1)
Lymph, poc: 1.2 (ref 0.6–3.4)
MCH, POC: 28.4 pg (ref 27–31.2)
MCHC: 31.2 g/dL — AB (ref 31.8–35.4)
MID (cbc): 0.6 (ref 0–0.9)
MPV: 8.9 fL (ref 0–99.8)
POC MID %: 6 %M (ref 0–12)
Platelet Count, POC: 222 10*3/uL (ref 142–424)
WBC: 10.2 10*3/uL (ref 4.6–10.2)

## 2011-11-05 NOTE — Patient Instructions (Signed)
Continue antibiotics Called your urologist office. I advised that they try and get you in with a pulmonary specialist this week. If you're seeing a pulmonary specialist the air, continue on with the Lovenox in the hopes that you can have surgery. If they cannot get you in with a specialist, call me back and I will see what we can do here. If your surgery is postponed, when we see you back next week.  Practice deep breathing and coughing out all the phlegm he can.

## 2011-11-05 NOTE — Progress Notes (Addendum)
Subjective: Patient was here last week. Chest x-ray was interpreted by the radiologist as possibly having some early infiltrate. Clinically he has not been feeling any worse. He coughs sometimes gets a little soreness abdomen from coughing. Has not been running a fever. He says his O2 saturation use runs around 96% he is scheduled for orchiectomy on Friday at [facility].  Objective: His neck is soft without any nodes. Chest clear to auscultation. Heart regular without murmurs.  Assessment: Abnormal chest x-ray in a preop patient  Plan: Check chest x-ray and CBC His pulse ox was 92 when rechecked it, it and take a few coughs and rechecked it came up a size 97.  Results for orders placed in visit on 11/05/11  POCT CBC      Component Value Range   WBC 10.2  4.6 - 10.2 K/uL   Lymph, poc 1.2  0.6 - 3.4   POC LYMPH PERCENT 11.6  10 - 50 %L   MID (cbc) 0.6  0 - 0.9   POC MID % 6.0  0 - 12 %M   POC Granulocyte 8.4 (*) 2 - 6.9   Granulocyte percent 82.4 (*) 37 - 80 %G   RBC 3.91 (*) 4.69 - 6.13 M/uL   Hemoglobin 11.1 (*) 14.1 - 18.1 g/dL   HCT, POC 16.1 (*) 09.6 - 53.7 %   MCV 91.0  80 - 97 fL   MCH, POC 28.4  27 - 31.2 pg   MCHC 31.2 (*) 31.8 - 35.4 g/dL   RDW, POC 04.5     Platelet Count, POC 222  142 - 424 K/uL   MPV 8.9  0 - 99.8 fL   [facility] reading (PRIMARY) by  Dr. Alwyn Ren Unchanged from last week.  Still a little hazy on left.  Imp: LLL possible infiltrate.    Advised seeing a pulmonologist before surgery if at all possible. He is going to call [facility] and see if they can get him in with one. We discussed what to do with the Lovenox and Coumadin in the event that surgery is being postponed or not. He is to call the urology office at [facility]  . Radiologists still is concerned about infection in that left lung. I called the wife and spoke to her. She is getting ready to call [facility] about him.

## 2011-11-06 ENCOUNTER — Telehealth: Payer: Self-pay

## 2011-11-06 ENCOUNTER — Telehealth: Payer: Self-pay | Admitting: Cardiology

## 2011-11-06 MED ORDER — ENOXAPARIN SODIUM 120 MG/0.8ML ~~LOC~~ SOLN: 120.0000 mg | Freq: Two times a day (BID) | SUBCUTANEOUS | Status: DC

## 2011-11-06 NOTE — Telephone Encounter (Signed)
PATIENT WANTS DR. HOPPER TO KNOW THAT HER HUSBANDS SURGERY THAT WAS SCHEDULED FOR Friday HAS BEEN CANCELED AND WILL NOT BE RESCHEDULED UNTIL HE HAS COMPLETELY HEALED.    THEY BELIEVE HE HAS PNEUMONIA.  SHE ALSO SAYS HE GOT AN APPOINTMENT WITH A PULMINOLOGIST

## 2011-11-06 NOTE — Telephone Encounter (Signed)
New msg Pt's wife said that he has pneumonia and wants to talk to a nurse. Please call back

## 2011-11-06 NOTE — Telephone Encounter (Signed)
I spoke with the pt's wife and the pt's surgery has been cancelled on Friday due to infection in lungs (Urology procedure at [facility] due to prostate CA). The pt started developing symptoms on 10/23/11 and has continued to decline.  The pt did see his PCP on 11/05/11 and they recommended that the pt be seen by Pulmonary.  The wife contacted [facility] and they instructed her to have the pt seen locally. I spoke with Dr Patty Sermons and he would like the pt evaluated this week by [facility].  The first available appointment is 11/07/11 at 9:45 with Dr Sherene Sires. I made the pt's wife aware of appointment, she will take CD of chest x-rays and list of medications.  I have also spoken with Cicero Duck RN in the coumadin clinic and she will address the pt's coumadin issues since surgery has been cancelled.

## 2011-11-06 NOTE — Telephone Encounter (Signed)
Spoke with pt's wife, surgery has been cancelled until pt's sees pulmonologist and is cleared by them for surgery.  Advised pt's wife to restart Coumadin today, take 5mg  today, then 2.5mg  daily and continue Lovenox 120mg  every 12 hrs until recheck on Friday 11/09/11.  Sent rx to [facility] for additional Lovenox syringes, pt's wife aware.

## 2011-11-07 ENCOUNTER — Inpatient Hospital Stay (HOSPITAL_COMMUNITY)
Admission: EM | Admit: 2011-11-07 | Discharge: 2011-11-19 | DRG: 555 | Disposition: A | Discharge status: Home / Self Care | Payer: Medicare Other | Attending: Internal Medicine | Admitting: Internal Medicine

## 2011-11-07 ENCOUNTER — Emergency Department (HOSPITAL_COMMUNITY): Payer: Medicare Other

## 2011-11-07 ENCOUNTER — Encounter (HOSPITAL_COMMUNITY): Payer: Self-pay | Admitting: Emergency Medicine

## 2011-11-07 ENCOUNTER — Institutional Professional Consult (permissible substitution): Payer: Medicare Other | Admitting: Internal Medicine

## 2011-11-07 ENCOUNTER — Telehealth: Payer: Self-pay | Admitting: Cardiology

## 2011-11-07 DIAGNOSIS — I4891 Unspecified atrial fibrillation: Secondary | ICD-10-CM | POA: Diagnosis present

## 2011-11-07 DIAGNOSIS — D689 Coagulation defect, unspecified: Secondary | ICD-10-CM | POA: Diagnosis present

## 2011-11-07 DIAGNOSIS — E1169 Type 2 diabetes mellitus with other specified complication: Secondary | ICD-10-CM | POA: Diagnosis present

## 2011-11-07 DIAGNOSIS — I219 Acute myocardial infarction, unspecified: Secondary | ICD-10-CM

## 2011-11-07 DIAGNOSIS — D6832 Hemorrhagic disorder due to extrinsic circulating anticoagulants: Secondary | ICD-10-CM

## 2011-11-07 DIAGNOSIS — D62 Acute posthemorrhagic anemia: Secondary | ICD-10-CM | POA: Diagnosis present

## 2011-11-07 DIAGNOSIS — N183 Chronic kidney disease, stage 3 unspecified: Secondary | ICD-10-CM | POA: Diagnosis present

## 2011-11-07 DIAGNOSIS — E119 Type 2 diabetes mellitus without complications: Secondary | ICD-10-CM | POA: Diagnosis present

## 2011-11-07 DIAGNOSIS — Z7901 Long term (current) use of anticoagulants: Secondary | ICD-10-CM

## 2011-11-07 DIAGNOSIS — B965 Pseudomonas (aeruginosa) (mallei) (pseudomallei) as the cause of diseases classified elsewhere: Secondary | ICD-10-CM | POA: Diagnosis present

## 2011-11-07 DIAGNOSIS — S301XXA Contusion of abdominal wall, initial encounter: Secondary | ICD-10-CM | POA: Diagnosis present

## 2011-11-07 DIAGNOSIS — Z7982 Long term (current) use of aspirin: Secondary | ICD-10-CM

## 2011-11-07 DIAGNOSIS — Z8546 Personal history of malignant neoplasm of prostate: Secondary | ICD-10-CM

## 2011-11-07 DIAGNOSIS — Z79899 Other long term (current) drug therapy: Secondary | ICD-10-CM

## 2011-11-07 DIAGNOSIS — Z85038 Personal history of other malignant neoplasm of large intestine: Secondary | ICD-10-CM

## 2011-11-07 DIAGNOSIS — Z87891 Personal history of nicotine dependence: Secondary | ICD-10-CM

## 2011-11-07 DIAGNOSIS — N259 Disorder resulting from impaired renal tubular function, unspecified: Secondary | ICD-10-CM

## 2011-11-07 DIAGNOSIS — N039 Chronic nephritic syndrome with unspecified morphologic changes: Secondary | ICD-10-CM

## 2011-11-07 DIAGNOSIS — I495 Sick sinus syndrome: Secondary | ICD-10-CM

## 2011-11-07 DIAGNOSIS — I2510 Atherosclerotic heart disease of native coronary artery without angina pectoris: Secondary | ICD-10-CM | POA: Diagnosis present

## 2011-11-07 DIAGNOSIS — J189 Pneumonia, unspecified organism: Secondary | ICD-10-CM | POA: Insufficient documentation

## 2011-11-07 DIAGNOSIS — T45515A Adverse effect of anticoagulants, initial encounter: Secondary | ICD-10-CM | POA: Diagnosis present

## 2011-11-07 DIAGNOSIS — E785 Hyperlipidemia, unspecified: Secondary | ICD-10-CM | POA: Diagnosis present

## 2011-11-07 DIAGNOSIS — E8779 Other fluid overload: Secondary | ICD-10-CM | POA: Diagnosis present

## 2011-11-07 DIAGNOSIS — N179 Acute kidney failure, unspecified: Secondary | ICD-10-CM

## 2011-11-07 DIAGNOSIS — I129 Hypertensive chronic kidney disease with stage 1 through stage 4 chronic kidney disease, or unspecified chronic kidney disease: Secondary | ICD-10-CM | POA: Diagnosis present

## 2011-11-07 DIAGNOSIS — N170 Acute kidney failure with tubular necrosis: Secondary | ICD-10-CM | POA: Diagnosis present

## 2011-11-07 DIAGNOSIS — C61 Malignant neoplasm of prostate: Secondary | ICD-10-CM

## 2011-11-07 DIAGNOSIS — Z794 Long term (current) use of insulin: Secondary | ICD-10-CM

## 2011-11-07 DIAGNOSIS — E669 Obesity, unspecified: Secondary | ICD-10-CM | POA: Diagnosis present

## 2011-11-07 DIAGNOSIS — R55 Syncope and collapse: Secondary | ICD-10-CM

## 2011-11-07 DIAGNOSIS — D126 Benign neoplasm of colon, unspecified: Secondary | ICD-10-CM

## 2011-11-07 DIAGNOSIS — E871 Hypo-osmolality and hyponatremia: Secondary | ICD-10-CM | POA: Diagnosis not present

## 2011-11-07 DIAGNOSIS — N390 Urinary tract infection, site not specified: Secondary | ICD-10-CM | POA: Diagnosis present

## 2011-11-07 DIAGNOSIS — Z888 Allergy status to other drugs, medicaments and biological substances status: Secondary | ICD-10-CM

## 2011-11-07 DIAGNOSIS — I498 Other specified cardiac arrhythmias: Secondary | ICD-10-CM

## 2011-11-07 DIAGNOSIS — N189 Chronic kidney disease, unspecified: Secondary | ICD-10-CM

## 2011-11-07 DIAGNOSIS — M7981 Nontraumatic hematoma of soft tissue: Principal | ICD-10-CM | POA: Diagnosis present

## 2011-11-07 DIAGNOSIS — R0600 Dyspnea, unspecified: Secondary | ICD-10-CM

## 2011-11-07 DIAGNOSIS — I10 Essential (primary) hypertension: Secondary | ICD-10-CM | POA: Diagnosis present

## 2011-11-07 DIAGNOSIS — I959 Hypotension, unspecified: Secondary | ICD-10-CM | POA: Diagnosis present

## 2011-11-07 DIAGNOSIS — I480 Paroxysmal atrial fibrillation: Secondary | ICD-10-CM | POA: Diagnosis present

## 2011-11-07 DIAGNOSIS — Z951 Presence of aortocoronary bypass graft: Secondary | ICD-10-CM

## 2011-11-07 DIAGNOSIS — R578 Other shock: Secondary | ICD-10-CM | POA: Diagnosis present

## 2011-11-07 HISTORY — DX: Essential (primary) hypertension: I10

## 2011-11-07 LAB — CBC
HCT: 28.2 % — ABNORMAL LOW (ref 39.0–52.0)
HCT: 23.2 % — ABNORMAL LOW (ref 39.0–52.0)
Hemoglobin: 9.4 g/dL — ABNORMAL LOW (ref 13.0–17.0)
Hemoglobin: 7.7 g/dL — ABNORMAL LOW (ref 13.0–17.0)
MCHC: 33.3 g/dL (ref 30.0–36.0)
RBC: 2.58 MIL/uL — ABNORMAL LOW (ref 4.22–5.81)
RDW: 14.7 % (ref 11.5–15.5)
WBC: 14.3 10*3/uL — ABNORMAL HIGH (ref 4.0–10.5)
WBC: 17.2 10*3/uL — ABNORMAL HIGH (ref 4.0–10.5)

## 2011-11-07 LAB — COMPREHENSIVE METABOLIC PANEL
AST: 11 U/L (ref 0–37)
Albumin: 3.2 g/dL — ABNORMAL LOW (ref 3.5–5.2)
Alkaline Phosphatase: 73 U/L (ref 39–117)
BUN: 26 mg/dL — ABNORMAL HIGH (ref 6–23)
CO2: 24 mEq/L (ref 19–32)
Chloride: 92 mEq/L — ABNORMAL LOW (ref 96–112)
GFR calc non Af Amer: 31 mL/min — ABNORMAL LOW (ref >90)
Potassium: 3.7 mEq/L (ref 3.5–5.1)
Total Bilirubin: 0.6 mg/dL (ref 0.3–1.2)

## 2011-11-07 LAB — DIFFERENTIAL
Basophils Absolute: 0 10*3/uL (ref 0.0–0.1)
Basophils Relative: 0 % (ref 0–1)
Lymphocytes Relative: 4 % — ABNORMAL LOW (ref 12–46)
Monocytes Absolute: 0.8 10*3/uL (ref 0.1–1.0)
Monocytes Relative: 5 % (ref 3–12)
Neutro Abs: 12.9 10*3/uL — ABNORMAL HIGH (ref 1.7–7.7)
Neutrophils Relative %: 90 % — ABNORMAL HIGH (ref 43–77)

## 2011-11-07 LAB — LIPASE, BLOOD: Lipase: 15 U/L (ref 11–59)

## 2011-11-07 LAB — CARDIAC PANEL(CRET KIN+CKTOT+MB+TROPI)
Relative Index: 2.3 (ref 0.0–2.5)
Total CK: 93 U/L (ref 7–232)
Total CK: 101 U/L (ref 7–232)
Troponin I: <0.3 ng/mL (ref <0.30)

## 2011-11-07 LAB — PRO B NATRIURETIC PEPTIDE: Pro B Natriuretic peptide (BNP): 1968 pg/mL — ABNORMAL HIGH (ref 0–450)

## 2011-11-07 LAB — URINALYSIS, ROUTINE W REFLEX MICROSCOPIC
Glucose, UA: NEGATIVE mg/dL
pH: 5.5 (ref 5.0–8.0)

## 2011-11-07 LAB — LACTIC ACID, PLASMA: Lactic Acid, Venous: 3.3 mmol/L — ABNORMAL HIGH (ref 0.5–2.2)

## 2011-11-07 LAB — URINE MICROSCOPIC-ADD ON

## 2011-11-07 LAB — CORTISOL: Cortisol, Plasma: 41 ug/dL

## 2011-11-07 LAB — MAGNESIUM: Magnesium: 1.9 mg/dL (ref 1.5–2.5)

## 2011-11-07 LAB — PHOSPHORUS: Phosphorus: 3.2 mg/dL (ref 2.3–4.6)

## 2011-11-07 LAB — MRSA PCR SCREENING: MRSA by PCR: NEGATIVE

## 2011-11-07 MED ORDER — ALBUTEROL SULFATE (5 MG/ML) 0.5% IN NEBU
INHALATION_SOLUTION | RESPIRATORY_TRACT | Status: AC
  Administered 2011-11-07: 08:00:00
  Filled 2011-11-07: qty 1

## 2011-11-07 MED ORDER — FENTANYL CITRATE 0.05 MG/ML IJ SOLN
INTRAMUSCULAR | Status: AC
  Filled 2011-11-07: qty 2

## 2011-11-07 MED ORDER — SODIUM CHLORIDE 0.9 % IV SOLN
INTRAVENOUS | Status: DC
  Administered 2011-11-07: 10:00:00 via INTRAVENOUS
  Administered 2011-11-07: 1000 mL via INTRAVENOUS
  Administered 2011-11-07: 09:00:00 via INTRAVENOUS
  Administered 2011-11-08: 1000 mL via INTRAVENOUS

## 2011-11-07 MED ORDER — FENTANYL CITRATE 0.05 MG/ML IJ SOLN
50.0000 ug | Freq: Once | INTRAMUSCULAR | Status: AC
  Administered 2011-11-07: 50 ug via INTRAVENOUS

## 2011-11-07 MED ORDER — BENZONATATE 100 MG PO CAPS
100.0000 mg | ORAL_CAPSULE | Freq: Once | ORAL | Status: AC
  Administered 2011-11-07: 100 mg via ORAL
  Filled 2011-11-07: qty 1

## 2011-11-07 MED ORDER — ACETAMINOPHEN 325 MG PO TABS
650.0000 mg | ORAL_TABLET | ORAL | Status: DC | PRN
  Administered 2011-11-07 – 2011-11-14 (×3): 650 mg via ORAL
  Filled 2011-11-07 (×3): qty 2

## 2011-11-07 MED ORDER — FENTANYL CITRATE 0.05 MG/ML IJ SOLN
12.5000 ug | Freq: Once | INTRAMUSCULAR | Status: AC
  Administered 2011-11-07: 12.5 ug via INTRAVENOUS
  Filled 2011-11-07: qty 2

## 2011-11-07 MED ORDER — INSULIN ASPART 100 UNIT/ML ~~LOC~~ SOLN
0.0000 [IU] | Freq: Three times a day (TID) | SUBCUTANEOUS | Status: DC
  Administered 2011-11-08: 5 [IU] via SUBCUTANEOUS

## 2011-11-07 MED ORDER — SODIUM CHLORIDE 0.9 % IV SOLN: INTRAVENOUS | Status: AC

## 2011-11-07 MED ORDER — DEXTROSE 5 % IV SOLN
0.3000 ug/kg | Freq: Once | INTRAVENOUS | Status: AC
  Administered 2011-11-07: 34.8 ug via INTRAVENOUS
  Filled 2011-11-07: qty 8.7

## 2011-11-07 MED ORDER — SODIUM CHLORIDE 0.9 % IV SOLN
INTRAVENOUS | Status: DC
  Administered 2011-11-09: 50 mL/h via INTRAVENOUS

## 2011-11-07 MED ORDER — SODIUM CHLORIDE 0.9 % IV SOLN: 250.0000 mL | INTRAVENOUS | Status: DC | PRN

## 2011-11-07 MED ORDER — FENTANYL CITRATE 0.05 MG/ML IJ SOLN
25.0000 ug | INTRAMUSCULAR | Status: DC | PRN
  Administered 2011-11-08 (×2): 75 ug via INTRAVENOUS
  Filled 2011-11-07 (×3): qty 2

## 2011-11-07 MED ORDER — PROTAMINE SULFATE 10 MG/ML IV SOLN
60.0000 mg | INTRAVENOUS | Status: AC
  Administered 2011-11-07: 60 mg via INTRAVENOUS
  Filled 2011-11-07: qty 6

## 2011-11-07 MED ORDER — VITAMIN K1 10 MG/ML IJ SOLN
10.0000 mg | Freq: Once | INTRAVENOUS | Status: AC
  Administered 2011-11-07: 10 mg via INTRAVENOUS
  Filled 2011-11-07: qty 1

## 2011-11-07 MED ORDER — SODIUM CHLORIDE 0.9 % IV BOLUS (SEPSIS)
500.0000 mL | Freq: Once | INTRAVENOUS | Status: AC
  Administered 2011-11-07: 500 mL via INTRAVENOUS

## 2011-11-07 NOTE — Progress Notes (Addendum)
eLink Physician-Brief Progress Note Patient Name: Jeffery Gibson DOB: 1928/08/09 MRN: [id number]  Date of Service  11/07/2011   HPI/Events of Note   Tylenol for pain at bleeding site insuling sliding scale  eICU Interventions   Addendum 7:27 PM RN says tylenol did not help She thinks abd more distended  Will give small dose fent for pain Stat cbc      [name] 11/07/2011, 6:50 PM

## 2011-11-07 NOTE — ED Notes (Signed)
Family at bedside. 

## 2011-11-07 NOTE — ED Notes (Signed)
Pt denies SOB, nausea, itching or CP. Resting comfortably.

## 2011-11-07 NOTE — Progress Notes (Addendum)
eLink Physician-Brief Progress Note Patient Name: Jeffery Gibson DOB: 01/16/1929 MRN: [id number]  Date of Service  11/07/2011   HPI/Events of Note   Fentanyl 12.5 did not relieve pain. Pain scale in abdomen still 10/10. Patient and family upset that pain control is poor  eICU Interventions  Increase fentanyl test dose to Reassess pain score in 20 min  Addendum 11:04 PM  - 30 min later  =-Pain score down to 7 but going up again  - give another fentanuyl and reassss    Intervention Category Intermediate Interventions: Pain - evaluation and management  Farryn Linares 11/07/2011, 10:28 PM

## 2011-11-07 NOTE — ED Provider Notes (Signed)
History     CSN: [id number]  Arrival date & time 11/07/11  0807   First MD Initiated Contact with Patient 11/07/11 [phone]      Chief Complaint  Patient presents with  . Weakness    (Consider location/radiation/quality/duration/timing/severity/associated sxs/prior treatment) HPI Pt with history of prostate cancer reports he was scheduled for procedure on prostate last Friday at [facility] which was cancelled because he had developed a pneumonia. He had his coumadin stopped and was bridging with lovenox. He had been rescheduled to have procedure this week but was not getting better so he was started back on his coumadin yesterday. He is also apparently still getting lovenox. He was up to the bathroom earlier this morning when he felt weak and dizzy. Denies LOC, but states he felt like he was going to pass out. His wife helped him back to bed and EMS was called. They report he was pale on arrival but BP and HR were normal. He is feeling better now. Denies any CP, SOB, vomiting, diarrhea, blood in stool or fever. He has been coughing a lot. He was given albuterol en route due to reported wheezing.   Past Medical History  Diagnosis Date  . Cancer     prostate/on Lupron  . Ischemic heart disease 05/06/06    post CARG 05/06/06  . Atrial flutter     afib and atrial flutter (permanent)  . Tachycardia-bradycardia 1997    dual-chamber/for tachybradycardia syndrome  . Obese     exogenous  . Adenocarcinoma of colon 11/2003    stage 2(T3,N0,M0)  . Renal insufficiency   . Anemia of chronic disease     aranesp injections  . Dyslipidemia   . Coronary artery disease   . Anemia associated with chronic renal failure 04/05/2011    Past Surgical History  Procedure Date  . Laparotomy 12/04/2003    resection of rectosigmoid carcinoma/  . Radioactive seed implant 05/2005    transperianeal placement I-125 for prostate cancer/# of  seeds 55  . Cardiac catheterization 05/01/06    EF 40%/diffuse 3  vessel CAD/tight L antereior descending artery stenosis/diffuse disease proximal L anterior descending  . Coronary artery bypass graft 05/06/06    x4 with L internal mammary artery to the L anterior descending coronary artery  . Pacemaker removal 1997    Family History  Problem Relation Age of Onset  . Heart failure Father 29  . COPD    . Coronary artery disease    . Gallbladder disease Father   . Emphysema      History  Substance Use Topics  . Smoking status: Former Smoker    Types: Cigarettes    Quit date: 05/22/1963  . Smokeless tobacco: Not on file  . Alcohol Use: No      Review of Systems All other systems reviewed and are negative except as noted in HPI.   Allergies  Bextra; Latex; Metformin and related; Mevacor; and Ramipril  Home Medications   Current Outpatient Rx  Name Route Sig Dispense Refill  . ABIRATERONE ACETATE 250 MG PO TABS Oral Take 4 tablets (1,000 mg total) by mouth daily. Take on an empty stomach 1 hour before or 2 hours after a meal 120 tablet 4  . AMARYL 1 MG PO TABS  TAKE 1 TABLET DAILY. 90 each 3  . AMIODARONE HCL 200 MG PO TABS  TAKE (1/2) TABLET DAILY. 45 tablet 4  . VITAMIN C PO Oral Take 1 tablet by mouth daily.      Marland Kitchen  ASPIRIN 81 MG PO TABS Oral Take 81 mg by mouth daily.      Marland Kitchen BENZONATATE 100 MG PO CAPS  Use 1-2 tablets 3 times daily as necessary for cough. May be used with other cough medicines if needed. 20 capsule 0  . CEFDINIR 300 MG PO CAPS Oral Take 1 capsule (300 mg total) by mouth 2 (two) times daily. 14 capsule 0  . HYDROCOD POLST-CPM POLST ER 10-8 MG/5ML PO LQCR Oral Take 5 mLs by mouth every 12 (twelve) hours as needed. 120 mL 0  . VITAMIN D 2000 UNITS PO TABS Oral Take 2,000 Units by mouth daily.      Marland Kitchen COUMADIN 5 MG PO TABS  TAKE 1/2 TABLET ONCE DAILY OR AS DIRECTED. 45 each 3  . ARANESP IJ Injection Inject as directed.      Marland Kitchen ENOXAPARIN SODIUM 120 MG/0.8ML [place] SOLN Subcutaneous Inject 0.8 mLs (120 mg total) into the skin  every 12 (twelve) hours. 10 Syringe 1  . FINASTERIDE PO Oral Take 5 mg by mouth daily.      Marland Kitchen HYDROCHLOROTHIAZIDE 25 MG PO TABS      . IBUPROFEN 600 MG PO TABS  1 daily with food as needed 90 tablet 0  . IPRATROPIUM BROMIDE HFA 17 MCG/ACT IN AERS Inhalation Inhale 2 puffs into the lungs 3 (three) times daily. 1 Inhaler 1  . LOPRESSOR 50 MG PO TABS  TAKE 1 TABLET TWICE DAILY. 60 each 11  . MICARDIS 80 MG PO TABS  TAKE 1 TABLET ONCE DAILY. 90 each 3  . ONE-DAILY MULTI VITAMINS PO TABS Oral Take 1 tablet by mouth daily.      Marland Kitchen NITROGLYCERIN 0.4 MG SL SUBL Sublingual Place 1 tablet (0.4 mg total) under the tongue every 5 (five) minutes as needed for chest pain. 100 tablet 3  . PRILOSEC 20 MG PO CPDR  TAKE 1-2 CAPSULES DAILY. 180 each 3  . ROSUVASTATIN CALCIUM 10 MG PO TABS  1/2 tablet Monday Wednesday and friday    . TAMSULOSIN HCL 0.4 MG PO CAPS      . URSODIOL 300 MG PO CAPS Oral Take 300 mg by mouth 2 (two) times daily.        BP 135/65  Pulse 99  Temp 98.2 F (36.8 C) (Oral)  Resp 11  SpO2 91%  Physical Exam  Nursing note and vitals reviewed. Constitutional: He is oriented to person, place, and time. He appears well-developed and well-nourished.  HENT:  Head: Normocephalic and atraumatic.  Eyes: EOM are normal. Pupils are equal, round, and reactive to light.  Neck: Normal range of motion. Neck supple.  Cardiovascular: Normal rate, normal heart sounds and intact distal pulses.   Pulmonary/Chest: Effort normal and breath sounds normal. He has no wheezes. He has no rales.  Abdominal: Bowel sounds are normal. He exhibits no distension. There is no tenderness.  Musculoskeletal: Normal range of motion. He exhibits no edema and no tenderness.  Neurological: He is alert and oriented to person, place, and time. He has normal strength. No cranial nerve deficit or sensory deficit.  Skin: Skin is warm and dry. No rash noted. There is pallor.  Psychiatric: He has a normal mood and affect.     ED Course  Procedures (including critical care time)  Labs Reviewed  CBC - Abnormal; Notable for the following:    WBC 14.3 (*)     RBC 3.15 (*)     Hemoglobin 9.4 (*)     HCT  28.2 (*)     All other components within normal limits  DIFFERENTIAL - Abnormal; Notable for the following:    Neutrophils Relative 90 (*)     Neutro Abs 12.9 (*)     Lymphocytes Relative 4 (*)     Lymphs Abs 0.6 (*)     All other components within normal limits  COMPREHENSIVE METABOLIC PANEL - Abnormal; Notable for the following:    Sodium 128 (*)     Chloride 92 (*)     Glucose, Bld 248 (*)     BUN 26 (*)     Creatinine, Ser 1.95 (*)     Albumin 3.2 (*)     GFR calc non Af Amer 31 (*)     GFR calc Af Amer 36 (*)     All other components within normal limits  PROTIME-INR - Abnormal; Notable for the following:    Prothrombin Time 32.5 (*)     INR 3.11 (*)     All other components within normal limits  APTT - Abnormal; Notable for the following:    aPTT 76 (*)     All other components within normal limits  ETHANOL  POCT I-STAT TROPONIN I  OCCULT BLOOD, POC DEVICE  URINALYSIS, ROUTINE W REFLEX MICROSCOPIC  URINE CULTURE  TYPE AND SCREEN  PREPARE FRESH FROZEN PLASMA   Ct Chest Wo Contrast  11/07/2011  *RADIOLOGY REPORT*  Clinical Data: Weakness, cough and shortness of breath.  Evaluate for metastatic disease.  Recent diagnosis of pneumonia.  CT CHEST WITHOUT CONTRAST  Technique:  Multidetector CT imaging of the chest was performed following the standard protocol without IV contrast.  Comparison: CT of chest without contrast 06/12/2011.  Findings:  Mediastinum: The thyroid gland is again massively enlarged and heterogeneous in appearance, particularly the left lobe which has significant substernal extension along the high left paratracheal region.  This area is heterogeneous in attenuation and has multiple internal calcifications. This is similar to remote prior study from 04/09/2007, and presumably  represents a large goiter.  No pathologically enlarged mediastinal or hilar lymph nodes. Heart size is mildly enlarged. There is no significant pericardial fluid, thickening or pericardial calcification. There is atherosclerosis of the thoracic aorta, the great vessels of the mediastinum and the coronary arteries, including calcified atherosclerotic plaque in the left main, left anterior descending, ramus intermedius, left circumflex and right coronary arteries. The patient is status post median sternotomy for CABG with a LIMA to the LAD.  A right-sided subclavian pacemaker device is in place with lead tips terminating in the right atrial appendage and right ventricular apex.  The esophagus is unremarkable in appearance.  Lungs/Pleura: Previously noted 1 cm nodule in the right lower lobe has completely resolved (presumably infectious or inflammatory on the prior study).  There is a tiny amount of left-sided pleural fluid and/or thickening dependently, and there is a small amount of atelectasis in the inferior segment of the lingula and periphery of the left lower lobe.  Dependent atelectasis is also noted in the right lower lobe.  No acute consolidative airspace disease.  No definite suspicious appearing pulmonary nodules or masses are identified.  Upper Abdomen: Incidental imaging through the upper abdomen is remarkable for marked thickening of the left rectus abdominus musculature, and high attenuation fluid accumulating within this region, concerning for rectus sheath hematoma.  Numerous colonic diverticula are noted.  Partially calcified gallstones are seen layering dependently within the lumen of the gallbladder, but there are no signs to suggest  acute cholecystitis at this time.  Musculoskeletal: Median sternotomy wires. There are no aggressive appearing lytic or blastic lesions noted in the visualized portions of the skeleton.  IMPRESSION:  1.  No evidence of pneumonia at this time. 2. Interval development of  thickening and high attenuation in the left rectus sheath, concerning for a rectus sheath hematoma in this patient with history of atrial fibrillation on anticoagulation therapy. 3.  Previously noted right lower lobe pulmonary nodule has resolved compared to the prior study, indicative that this was of infectious or inflammatory etiology on the prior study. 4.  Trace left-sided pleural fluid and/or thickening with minimal bibasilar subsegmental atelectasis. 5. Atherosclerosis, including extensive coronary artery disease. Please note that although the presence of coronary artery calcium documents the presence of coronary artery disease, the severity of this disease and any potential stenosis cannot be assessed on this non-gated CT examination.   The patient is status post median sternotomy for CABG with a LIMA to the LAD. 6.  Cholelithiasis without findings to suggest acute cholecystitis. 7.  Colonic diverticulosis. 8.  Additional incidental findings, as detailed above.  Original Report Authenticated By: Florencia Reasons, M.D.     1. Hematoma of rectus sheath   2. Warfarin-induced coagulopathy   3. Near syncope       MDM   Date: 11/07/2011  Rate: 55  Rhythm: sinus bradycardia  QRS Axis: normal  Intervals: normal  ST/T Wave abnormalities: normal  Conduction Disutrbances:first-degree A-V block   Narrative Interpretation:   Old EKG Reviewed: changes noted, prior paced rhythm  11:36 AM Rectal exam is normal, heme neg. CT chest as above, sent back for CT abd/pel which shows a large rectus sheath hematoma. Discussed with Dr. Brien Few who will admit. FFP and Vit K ordered to reverse Coumadin. Will also discuss with Gen Surg.         Kaylene Dawn B. Bernette Mayers, MD 11/07/11 1141

## 2011-11-07 NOTE — ED Notes (Signed)
Advised pt. Needed urine sample. Pt. Stated he usually catherises himself x2 daily. Suggested to cath pt. If he is unable to void in next 20 or so minutes, given he is receiving fluids via IV. RN Kennith Center notified.

## 2011-11-07 NOTE — ED Notes (Signed)
ZOX:[id number]<AV> Expected date:11/07/11<BR> Expected time: 7:54 AM<BR> Means of arrival:Ambulance<BR> Comments:<BR> ems?

## 2011-11-07 NOTE — Telephone Encounter (Signed)
Okay, thx

## 2011-11-07 NOTE — ED Notes (Signed)
Report called to floor nurse, awaiting room to be cleaned.

## 2011-11-07 NOTE — ED Notes (Signed)
Patient transported to CT 

## 2011-11-07 NOTE — ED Notes (Signed)
Pt given urinal. Unable to urinate at this time.  

## 2011-11-07 NOTE — ED Notes (Signed)
Pt sweating, hx DM. CBG is 239.

## 2011-11-07 NOTE — ED Notes (Signed)
Plasma rate increased to infuse over 30 min. Pt tolerating well without S/S reaction. Pt denies CP/SOB/nausea or itching. VS WNL.

## 2011-11-07 NOTE — H&P (Signed)
Name: Jeffery Gibson MRN: [id number] DOB: 1928/08/07    LOS: 0  Referring Provider:  Brien Few Reason for Referral:  hypotension  PULMONARY / CRITICAL CARE MEDICINE  HPI:   78 year old male w/ h/o prostate cancer and chronic AF,  reports scheduled for procedure on prostate at [facility] which was cancelled because he had developed a pneumonia. He had his coumadin stopped and was bridging with lovenox. He had been rescheduled to have procedure,  but was not getting better so he was started back on his coumadin 6/18. He is also apparently still getting lovenox. The am of 6/19, he was up to the bathroom  when he felt weak and dizzy. Denies LOC, but stated he felt like he was going to pass out. His wife helped him back to bed and EMS was called. They report he was pale on arrival but BP and HR were normal.  Denied any CP, SOB, vomiting, diarrhea, blood in stool or fever. Did report significant left sided abd pain. During evaluation was found to have some mild orthostasis, w/ staff noting his SBP drifting down w/ postural changes. He had a CT of abd/pelvis to eval pain. This showed a left rectus sheath hematoma. He was to be admitted for reversal of anticoagulation and monitoring by the IM service. Due to his hypotension the PCCM team was asked to admit to the ICU.   Past Medical History  Diagnosis Date  . Ischemic heart disease 05/06/06    post CARG 05/06/06  . Atrial flutter     afib and atrial flutter (permanent)  . Tachycardia-bradycardia 1997    dual-chamber/for tachybradycardia syndrome  . Obese     exogenous  . Renal insufficiency   . Anemia of chronic disease     aranesp injections  . Dyslipidemia   . Coronary artery disease   . Anemia associated with chronic renal failure 04/05/2011  . Cancer     prostate/on Lupron  . Adenocarcinoma of colon 11/2003    stage 2(T3,N0,M0)  . Diabetes mellitus   . Hypertension    Past Surgical History  Procedure Date  . Laparotomy 12/04/2003   resection of rectosigmoid carcinoma/  . Radioactive seed implant 05/2005    transperianeal placement I-125 for prostate cancer/# of  seeds 55  . Cardiac catheterization 05/01/06    EF 40%/diffuse 3 vessel CAD/tight L antereior descending artery stenosis/diffuse disease proximal L anterior descending  . Coronary artery bypass graft 05/06/06    x4 with L internal mammary artery to the L anterior descending coronary artery  . Pacemaker removal 1997   Prior to Admission medications   Medication Sig Start Date End Date Taking? Authorizing Provider  AMARYL 1 MG tablet TAKE 1 TABLET DAILY. 06/30/11  Yes Cassell Clement, MD  amiodarone (PACERONE) 200 MG tablet TAKE (1/2) TABLET DAILY. 03/15/11  Yes Cassell Clement, MD  Ascorbic Acid (VITAMIN C PO) Take 1 tablet by mouth daily.     Yes Historical Provider, MD  aspirin 81 MG tablet Take 81 mg by mouth daily.     Yes Historical Provider, MD  benzonatate (TESSALON) 100 MG capsule Take 100-200 mg by mouth 3 (three) times daily as needed. Use 1-2 tablets 3 times daily as necessary for cough. May be used with other cough medicines if needed. 11/01/11 11/08/11 Yes Peyton Najjar, MD  cefdinir (OMNICEF) 300 MG capsule Take 300 mg by mouth 2 (two) times daily. 11/01/11 11/11/11 Yes Peyton Najjar, MD  chlorpheniramine-HYDROcodone (TUSSIONEX) 10-8 MG/5ML [facility]  Take 5 mLs by mouth every 12 (twelve) hours as needed. Cough 10/26/11  Yes Cassell Clement, MD  Cholecalciferol (VITAMIN D) 2000 UNITS tablet Take 2,000 Units by mouth daily.     Yes Historical Provider, MD  Darbepoetin Alfa-Albumin (ARANESP IJ) Inject 1 Syringe as directed See admin instructions. Every 4 weeks as needed for anemia las given 09-2011   Yes Historical Provider, MD  enoxaparin (LOVENOX) 120 MG/0.8ML injection Inject 0.8 mLs (120 mg total) into the skin every 12 (twelve) hours. 11/06/11  Yes Cassell Clement, MD  FINASTERIDE PO Take 5 mg by mouth at bedtime.    Yes Historical Provider, MD    hydrochlorothiazide (HYDRODIURIL) 25 MG tablet  04/17/11  Yes Cassell Clement, MD  ibuprofen (ADVIL,MOTRIN) 600 MG tablet 1 daily with food as needed 09/25/10  Yes Cassell Clement, MD  ipratropium (ATROVENT HFA) 17 MCG/ACT inhaler Inhale 2 puffs into the lungs 3 (three) times daily. 11/01/11 10/31/12 Yes Peyton Najjar, MD  leuprolide (LUPRON) 22.5 MG injection Inject 22.5 mg into the muscle See admin instructions. Pt gets injection every 6 months for prostate cancer last given april11   Yes Historical Provider, MD  metoprolol (LOPRESSOR) 50 MG tablet Take 50 mg by mouth 2 (two) times daily.   Yes Historical Provider, MD  Multiple Vitamin (MULTIVITAMIN) tablet Take 1 tablet by mouth daily.     Yes Historical Provider, MD  PRILOSEC 20 MG capsule TAKE 1-2 CAPSULES DAILY. 09/27/11  Yes Cassell Clement, MD  rosuvastatin (CRESTOR) 10 MG tablet Take 5 mg by mouth See admin instructions. 1/2 tablet Monday Wednesday and Friday and pt takes nothing  On rest of days 10/22/11  Yes Cassell Clement, MD  Tamsulosin HCl (FLOMAX) 0.4 MG CAPS Take 0.4 mg by mouth daily after supper.  05/28/11  Yes Historical Provider, MD  telmisartan (MICARDIS) 80 MG tablet Take 80 mg by mouth daily.   Yes Historical Provider, MD  ursodiol (ACTIGALL) 300 MG capsule Take 300 mg by mouth 2 (two) times daily.    Yes Historical Provider, MD  warfarin (COUMADIN) 5 MG tablet Take 5 mg by mouth See admin instructions. Pt takes 1 tablet by mouth every day except Wednesday and Thursday pt takes none   Yes Historical Provider, MD  nitroGLYCERIN (NITROSTAT) 0.4 MG SL tablet Place 1 tablet (0.4 mg total) under the tongue every 5 (five) minutes as needed for chest pain. 10/02/10 10/02/11  Cassell Clement, MD   Allergies Allergies  Allergen Reactions  . Bextra (Valdecoxib) Diarrhea  . Latex Hives  . Metformin And Related Nausea Only  . Mevacor (Lovastatin) Nausea And Vomiting  . Ramipril Cough    Family History Family History  Problem  Relation Age of Onset  . Heart failure Father 67  . Gallbladder disease Father   . COPD    . Coronary artery disease    . Emphysema     Social History  reports that he quit smoking about 48 years ago. His smoking use included Cigarettes. He has never used smokeless tobacco. He reports that he does not drink alcohol. His drug history not on file.  Review Of Systems:   Review of Systems  Constitutional: NoFevers, chills, fatigue .  HEENT: No headaches,or Sore throat, No sneezing, itching, ear ache, nasal congestion, post nasal drip, no visual complaints CV: No chest pain, Orthopnea, PND, swelling in lower extremities, dizziness, palpitations, syncope.  GI No heartburn, indigestion, positive for abdominal pain, no change in bowel habits, loss of appetite, bloody stools.  Resp: No cough, No coughing up of blood. No change in color of mucus. No wheezing.  Skin: no rash or itching or icterus GU: no dysuria, change in color of urine, no urgency or frequency. No flank pain, no hematuria  MS: No joint pain or swelling. No decreased range of motion  Psych: No change in mood or affect. No depression or anxiety.  Neuro: no difficulty with speech, weakness, numbness, ataxia    Brief patient description:   78 year old male w/ h/o prostate cancer and chronic AF, recently scheduled for procedure on prostate at [facility] which was cancelled because he had developed a pneumonia. He had his coumadin stopped and was bridging with lovenox. Started back on his coumadin 6/18 as procedure was rescheduled.  Admitted on 6/19 w/ large left rectus sheath hematoma, acute blood loss anemia and hypotension.    Vital Signs: Temp:  [96.7 F (35.9 C)-98.4 F (36.9 C)] 98.4 F (36.9 C) (06/19 1345) Pulse Rate:  [80-113] 95  (06/19 1345) Resp:  [11-26] 17  (06/19 1345) BP: (68-135)/(40-114) 128/114 mmHg (06/19 1345) SpO2:  [85 %-100 %] 91 % (06/19 1302) room air   Physical Examination: General:  Obese 78 year old  male, currently reports abd pain, but in no distress.  Neuro:  Awake and oriented w/ no focal def HEENT:  MMM no JVD Neck:  No adenopathy Cardiovascular:  Regular irregular. Has AF and A flutter on tele Lungs:  Decreased in bases.  Abdomen:  Obese, firm palp area on left upper/lower quadrant noted. Ecchymosis both lower quads.  Musculoskeletal:  intact Skin:  LE edema  Active Problems:  Rectus sheath hematoma  Acute blood loss anemia  Hypotension  Coagulopathy  Diabetes mellitus  CAD (coronary artery disease)  HTN (hypertension)  Community acquired pneumonia   ASSESSMENT AND PLAN  PULMONARY No results found for this basename: PHART:5,PCO2:5,PCO2ART:5,PO2ART:5,HCO3:5,O2SAT:5 in the last 168 hours Ventilator Settings:   CXR:  Clear w/out infiltrates. Has PPM ETT:    A:  No acute pulmonary issue. Is at risk for edema given cardiac hx, and renal insuff, in the setting of blood product resuscitation efforts.  P:   -admit to ICU -cont Pulse Ox -f/u am cxr -fio2 PRN -monitor abdo distention that may affect resp effort  CARDIOVASCULAR No results found for this basename: TROPONINI:5,LATICACIDVEN:5, O2SATVEN:5,PROBNP:5 in the last 168 hours ECG:  A fib/flutter Lines:   A: Chronic Atrial Fib (currently w/ CVR) P:  -rx anemia -rate control -not candidate for anticoagulation at this point due to hematoma  A: Orthostatic Hypotension. Appears hemodynamically stable at this point.  P: -hydrate -ICU monitoring -bed rest for now -blood products as indicated.   RENAL  Lab 11/07/11 0915  NA 128*  K 3.7  CL 92*  CO2 24  BUN 26*  CREATININE 1.95*  CALCIUM 8.4  MG 1.9  PHOS 3.2   Intake/Output    None    Foley:    A:  Chronic renal insuff. Appears as though BL scr in 2 range.  P:   -strict I&O -renal dose meds -f/u chemistry -if significant products required, early lasix  A: H/O prostate cancer w/ h/o urinary retention. He self caths at home.  P: -place  foley given admit to ICU  Avoiding in foley by pt in nosocomial environement  GASTROINTESTINAL  Lab 11/07/11 0915  AST 11  ALT 9  ALKPHOS 73  BILITOT 0.6  PROT 6.4  ALBUMIN 3.2*    A:  Spontaneous Left Rectus  Sheath Hematoma. No evidence of GIB P:   -cl liq diet -see heme section  HEMATOLOGIC  Lab 11/07/11 0915 11/05/11 0853 11/01/11 1434 11/01/11 1125  HGB 9.4* 11.1* 11.9* --  HCT 28.2* 35.6* 37.5* --  PLT 208 -- -- --  INR 3.11* -- -- 3.0  APTT 76* -- -- --   A:   Spontaneous Left Rectus Sheath Hematoma on anticoagulation (LMWH and coumadin) P:  -Stop all anticoagulation -4 units FFP -10 mg vit K -IV protamine  -f/u coags  -check anti-factor 10 a baseline  A: Acute Blood Loss anemia: due to above P: -recheck cbc on arrrival to ICU then q 6 until confident he is stable.  -coags follow up interpretation may be difficult Pt aware of poor reversal with lovenox  INFECTIOUS  Lab 11/07/11 0915 11/05/11 0853 11/01/11 1434  WBC 14.3* 10.2 8.6  PROCALCITON 0.10 -- --   Cultures:  Antibiotics:  A:  Recent PNA (CAP). He is s/p abx. No current radiographic or clinical evidence of infection. Think demarcation is from bleeding.  P:   -monitor cbc and fever curve.   ENDOCRINE No results found for this basename: GLUCAP:5 in the last 168 hours A:  H/o DM P:   -ssi  NEUROLOGIC  A:  No focal def, no acute issue P:   -monitor   BEST PRACTICE / DISPOSITION Level of Care:  ICU Primary Service:  PCCM Consultants:   Code Status:  full Diet:  Cl liq DVT Px:  pas GI Px:  ppi Skin Integrity:  intact Social / Family:  Updated.   BABCOCK,PETE,  11/07/2011, 1:53 PM   I have fully examined this patient and agree with above findings.    And edited in full  Mcarthur Rossetti. Tyson Alias, MD, FACP Pgr: [phone]  Pulmonary & Critical Care

## 2011-11-07 NOTE — Progress Notes (Signed)
eLink Physician-Brief Progress Note Patient Name: Jeffery Gibson DOB: [birth year] MRN: [id number]  Date of Service  11/07/2011   HPI/Events of Note   Lab 11/07/11 1935 11/07/11 0915 11/05/11 0853 11/01/11 1434  HGB 7.7* 9.4* 11.1* 11.9*    Lab 11/07/11 0915  CREATININE 1.95*     Lab 11/07/11 1935 11/07/11 0915  PLT 200 208     Enlarging abd wall per RN  C/o progressive abd pain and needed fentanyl for control   eICU Interventions  D/w Dr Carolynne Edouard - agree with him this is cannot be surgically corrected  Give DDAVP for uremia related bleeding (has chronic creat in 2s); salvage attempt to control bleeding. Patient has no choice but to accept risk of thrombosis  Recheck pt, ptt, cbcc at 23:30   Intervention Category Major Interventions: Hemorrhage - evaluation and management Intermediate Interventions: Pain - evaluation and management  Elodia Haviland 11/07/2011, 9:35 PM

## 2011-11-07 NOTE — ED Notes (Addendum)
Per EMS- pt picked up from home with general weakness, about 3am this morning pt c/o of pallor, feeling faint, and clammy.  Pt has been on Lovenox and coumadin x2 days.  Pt has been diagnosed with Pneumonia x 2wk sand has been on antibiotics.   Was given an albuterol neb treatment via EMS.  Pt has a pacemaker.

## 2011-11-07 NOTE — ED Notes (Signed)
Attempted to insert catheter, admissions RN at bedside. Will revisit in 10 min

## 2011-11-07 NOTE — Telephone Encounter (Signed)
Pt on the way to [facility] ER, will be admitted

## 2011-11-07 NOTE — ED Notes (Signed)
Reported to charge Ardine Eng that I pt has been waiting to transferred to bed 1224 in stepdown.  Bed has been empty and unclean for two hours.  Called and gave receiving nurse report x30 mins and was told bed was being clean.  Called 15 minutes ago and bed is being clean.

## 2011-11-07 NOTE — Telephone Encounter (Signed)
This is an FYI per the daughter

## 2011-11-08 ENCOUNTER — Inpatient Hospital Stay (HOSPITAL_COMMUNITY): Payer: Medicare Other

## 2011-11-08 DIAGNOSIS — I219 Acute myocardial infarction, unspecified: Secondary | ICD-10-CM

## 2011-11-08 LAB — BASIC METABOLIC PANEL
CO2: 24 mEq/L (ref 19–32)
Calcium: 7.9 mg/dL — ABNORMAL LOW (ref 8.4–10.5)
Chloride: 93 mEq/L — ABNORMAL LOW (ref 96–112)
Chloride: 95 mEq/L — ABNORMAL LOW (ref 96–112)
Creatinine, Ser: 4.12 mg/dL — ABNORMAL HIGH (ref 0.50–1.35)
GFR calc Af Amer: 14 mL/min — ABNORMAL LOW (ref >90)
GFR calc non Af Amer: 12 mL/min — ABNORMAL LOW (ref >90)
Glucose, Bld: 253 mg/dL — ABNORMAL HIGH (ref 70–99)
Potassium: 4.3 mEq/L (ref 3.5–5.1)
Potassium: 4.4 mEq/L (ref 3.5–5.1)
Sodium: 129 mEq/L — ABNORMAL LOW (ref 135–145)

## 2011-11-08 LAB — CBC
HCT: 17.5 % — ABNORMAL LOW (ref 39.0–52.0)
Hemoglobin: 5.9 g/dL — CL (ref 13.0–17.0)
Hemoglobin: 6.4 g/dL — CL (ref 13.0–17.0)
MCH: 29.7 pg (ref 26.0–34.0)
MCH: 30.1 pg (ref 26.0–34.0)
MCH: 30.3 pg (ref 26.0–34.0)
MCHC: 34.6 g/dL (ref 30.0–36.0)
MCHC: 33.9 g/dL (ref 30.0–36.0)
Platelets: 168 10*3/uL (ref 150–400)
Platelets: 185 10*3/uL (ref 150–400)
Platelets: 195 10*3/uL (ref 150–400)
RBC: 1.96 MIL/uL — ABNORMAL LOW (ref 4.22–5.81)
RBC: 2.36 MIL/uL — ABNORMAL LOW (ref 4.22–5.81)
RDW: 15.1 % (ref 11.5–15.5)
RDW: 15.5 % (ref 11.5–15.5)
WBC: 21.8 10*3/uL — ABNORMAL HIGH (ref 4.0–10.5)

## 2011-11-08 LAB — CARDIAC PANEL(CRET KIN+CKTOT+MB+TROPI)
Relative Index: 1.8 (ref 0.0–2.5)
Total CK: 170 U/L (ref 7–232)

## 2011-11-08 LAB — PREPARE FRESH FROZEN PLASMA
Unit division: 0
Unit division: 0

## 2011-11-08 LAB — GLUCOSE, CAPILLARY
Glucose-Capillary: 250 mg/dL — ABNORMAL HIGH (ref 70–99)
Glucose-Capillary: 238 mg/dL — ABNORMAL HIGH (ref 70–99)
Glucose-Capillary: 250 mg/dL — ABNORMAL HIGH (ref 70–99)
Glucose-Capillary: 218 mg/dL — ABNORMAL HIGH (ref 70–99)
Glucose-Capillary: 167 mg/dL — ABNORMAL HIGH (ref 70–99)

## 2011-11-08 LAB — PHOSPHORUS: Phosphorus: 4.2 mg/dL (ref 2.3–4.6)

## 2011-11-08 LAB — APTT: aPTT: 39 seconds — ABNORMAL HIGH (ref 24–37)

## 2011-11-08 LAB — MAGNESIUM: Magnesium: 1.8 mg/dL (ref 1.5–2.5)

## 2011-11-08 LAB — PREPARE RBC (CROSSMATCH)

## 2011-11-08 MED ORDER — ONDANSETRON HCL 4 MG/2ML IJ SOLN
INTRAMUSCULAR | Status: AC
  Administered 2011-11-08: 4 mg via INTRAVENOUS
  Filled 2011-11-08: qty 2

## 2011-11-08 MED ORDER — ONDANSETRON HCL 4 MG/2ML IJ SOLN
4.0000 mg | Freq: Four times a day (QID) | INTRAMUSCULAR | Status: DC | PRN
  Administered 2011-11-08 – 2011-11-10 (×2): 4 mg via INTRAVENOUS
  Filled 2011-11-08: qty 2

## 2011-11-08 MED ORDER — FUROSEMIDE 10 MG/ML IJ SOLN
20.0000 mg | Freq: Once | INTRAMUSCULAR | Status: AC
  Administered 2011-11-08: 20 mg via INTRAVENOUS
  Filled 2011-11-08: qty 2

## 2011-11-08 MED ORDER — BISACODYL 10 MG RE SUPP: 10.0000 mg | Freq: Every day | RECTAL | Status: DC | PRN

## 2011-11-08 MED ORDER — BIOTENE DRY MOUTH MT LIQD
15.0000 mL | Freq: Two times a day (BID) | OROMUCOSAL | Status: DC
  Administered 2011-11-09 – 2011-11-19 (×20): 15 mL via OROMUCOSAL

## 2011-11-08 MED ORDER — FENTANYL CITRATE 0.05 MG/ML IJ SOLN
25.0000 ug | INTRAMUSCULAR | Status: DC | PRN
  Administered 2011-11-08: 50 ug via INTRAVENOUS
  Administered 2011-11-09 – 2011-11-10 (×7): 75 ug via INTRAVENOUS
  Administered 2011-11-10 – 2011-11-11 (×3): 50 ug via INTRAVENOUS
  Administered 2011-11-11 – 2011-11-14 (×3): 75 ug via INTRAVENOUS
  Administered 2011-11-14: 50 ug via INTRAVENOUS
  Administered 2011-11-15: 25 ug via INTRAVENOUS
  Filled 2011-11-08 (×18): qty 2

## 2011-11-08 MED ORDER — ONDANSETRON HCL 4 MG/2ML IJ SOLN: 4.0000 mg | Freq: Four times a day (QID) | INTRAMUSCULAR | Status: DC | PRN

## 2011-11-08 MED ORDER — DOCUSATE SODIUM 100 MG PO CAPS
100.0000 mg | ORAL_CAPSULE | Freq: Every day | ORAL | Status: DC
  Administered 2011-11-08 – 2011-11-19 (×12): 100 mg via ORAL
  Filled 2011-11-08 (×12): qty 1

## 2011-11-08 MED ORDER — BENZONATATE 100 MG PO CAPS
100.0000 mg | ORAL_CAPSULE | Freq: Three times a day (TID) | ORAL | Status: DC | PRN
  Administered 2011-11-08 – 2011-11-15 (×6): 100 mg via ORAL
  Filled 2011-11-08 (×6): qty 1

## 2011-11-08 MED ORDER — INSULIN GLARGINE 100 UNIT/ML ~~LOC~~ SOLN
5.0000 [IU] | Freq: Every day | SUBCUTANEOUS | Status: DC
Start: 2011-11-08 — End: 2011-11-12
  Administered 2011-11-08 – 2011-11-11 (×4): 5 [IU] via SUBCUTANEOUS

## 2011-11-08 MED ORDER — INSULIN ASPART 100 UNIT/ML ~~LOC~~ SOLN
0.0000 [IU] | Freq: Three times a day (TID) | SUBCUTANEOUS | Status: DC
  Administered 2011-11-08 (×2): 7 [IU] via SUBCUTANEOUS
  Administered 2011-11-09 (×2): 4 [IU] via SUBCUTANEOUS
  Administered 2011-11-09: 13:00:00 via SUBCUTANEOUS
  Administered 2011-11-10: 4 [IU] via SUBCUTANEOUS
  Administered 2011-11-10: 3 [IU] via SUBCUTANEOUS
  Administered 2011-11-13: 4 [IU] via SUBCUTANEOUS

## 2011-11-08 MED ORDER — PANTOPRAZOLE SODIUM 40 MG PO TBEC
40.0000 mg | DELAYED_RELEASE_TABLET | Freq: Every day | ORAL | Status: DC
  Administered 2011-11-08 – 2011-11-09 (×2): 40 mg via ORAL
  Filled 2011-11-08 (×2): qty 1

## 2011-11-08 NOTE — Progress Notes (Signed)
Name: Jeffery Gibson MRN: [id number] DOB: [birth year]    LOS: 1  PULMONARY / CRITICAL CARE MEDICINE  HPI:  78 year old male w/ h/o prostate cancer and chronic AF, recently scheduled for procedure on prostate at [facility] which was cancelled because he had developed a pneumonia. He had his coumadin stopped and was bridging with lovenox. Started back on his coumadin 6/18 as procedure was rescheduled. Admitted on 6/19 w/ large left rectus sheath hematoma, acute blood loss anemia and hypotension.   Subjective/Overnight:  Still c/o pain, also c/o nausea.  Require more units prbc  Vital Signs: Temp:  [96.7 F (35.9 C)-98.9 F (37.2 C)] 97.9 F (36.6 C) (06/20 0800) Pulse Rate:  [73-121] 117  (06/20 0900) Resp:  [9-32] 25  (06/20 0900) BP: (80-146)/(35-114) 114/45 mmHg (06/20 0900) SpO2:  [91 %-100 %] 99 % (06/20 0900) Weight:  [256 lb 9.9 oz (116.4 kg)-264 lb 8.8 oz (120 kg)] 264 lb 8.8 oz (120 kg) (06/20 0400) room air   Physical Examination: General:  Obese 78 year old male, currently reports abd pain, but in no distress.  Neuro:  Awake and oriented w/ no focal def HEENT:  MMM no JVD Neck:  No adenopathy Cardiovascular:  Regular irregular. Has AF and A flutter on tele Lungs:  Decreased in bases otherwise clear   Abdomen:  Obese, firm palp area on left upper/lower quadrant noted unchanged. Ecchymosis both lower quads, marked  Musculoskeletal:  intact Skin:  LE edema  Active Problems:  Rectus sheath hematoma  Acute blood loss anemia  Hypotension  Coagulopathy  Diabetes mellitus  CAD (coronary artery disease)  HTN (hypertension)  Community acquired pneumonia   ASSESSMENT AND PLAN  PULMONARY CXR:  Clear w/out infiltrates. Has PPM ETT:    A:  No acute pulmonary issue. Is at risk for edema given cardiac hx, and renal insuff, in the setting of blood product resuscitation efforts.  P:   -cont Pulse Ox -f/u am cxr for atx -fio2 PRN -monitor abdo distention that may affect  resp effort  CARDIOVASCULAR  Lab 11/08/11 0625 11/07/11 1933 11/07/11 1340  TROPONINI <0.30 <0.30 <0.30  LATICACIDVEN -- -- 3.3*  PROBNP -- -- 1968.0*   ECG:  A fib/flutter Lines:   A: Chronic Atrial Fib (currently w/ CVR) P:  -rx anemia -rate control -not candidate for anticoagulation at this point due to hematoma -- will need to discuss long term options, family would like to discuss with Dr. Patty Sermons. I called Dr Patty Sermons 6/19. Aware of his circumstances. Will involve cards when safe to reo consider restarting any agents  A: Orthostatic Hypotension. Appears hemodynamically stable at this point.  P: -hydration completed -tele monitoring -bed rest for now -blood products as indicated.   RENAL  Lab 11/08/11 0615 11/07/11 0915  NA 129* 128*  K 4.3 3.7  CL 93* 92*  CO2 24 24  BUN 36* 26*  CREATININE 3.26* 1.95*  CALCIUM 7.9* 8.4  MG 1.8 1.9  PHOS 4.2 3.2   Intake/Output      06/19 0701 - 06/20 0700 06/20 0701 - 06/21 0700   P.O.  120   I.V. (mL/kg) 1504.2 (12.5) 125 (1)   Blood 1237.5    Total Intake(mL/kg) 2741.7 (22.8) 245 (2)   Urine (mL/kg/hr) 205 (0.1) 150   Total Output 205 150   Net +2536.7 +95         Foley:    A:  Chronic renal insuff. Appears as though baseline scr ~2 range.  Worsening renal function 6/20.  Lasix given overnight - ?between units blood.  ATN from hypovolemia P:   -strict I&O -renal dose meds -f/u chemistry -Cont gentle volume as tol - ?worsening renal function after lasix  -recheck bmet this afternoon  -consider renal ultrasound with worsening renal function, large rectus sheath hematoma  A: H/O prostate cancer w/ h/o urinary retention. He self caths at home.  P: -foley Avoid self caths  GASTROINTESTINAL  Lab 11/07/11 0915  AST 11  ALT 9  ALKPHOS 73  BILITOT 0.6  PROT 6.4  ALBUMIN 3.2*    A:  Spontaneous Left Rectus Sheath Hematoma. No evidence of GIB P:   -cl liq diet -see heme section  HEMATOLOGIC  Lab  11/08/11 0625 11/07/11 2348 11/07/11 1935 11/07/11 0915 11/05/11 0853 11/01/11 1125  HGB 7.4* 5.9* 7.7* 9.4* 11.1* --  HCT 21.4* 17.5* 23.2* 28.2* 35.6* --  PLT 168 191 200 208 -- --  INR -- 1.38 -- 3.11* -- 3.0  APTT -- 39* -- 76* -- --   A:   Spontaneous Left Rectus Sheath Hematoma on anticoagulation (LMWH and coumadin) P:  -Stop all anticoagulation done on admit -s/p FFP and Vit K, inr now 1.3 -f/u coags  -check anti-factor 10 a baseline- pending -pt family very concerned about lack of anticoag in setting Afib - wish to speak to Dr. Patty Sermons (ok to do as outpt) -Lovenox with his baseline clearance MAY NOT BE AN OPTION IN FUTURE  A: Acute Blood Loss anemia: due to above.  No further blood needed.  P: -f/u coags, cbc   INFECTIOUS  Lab 11/08/11 0625 11/07/11 2348 11/07/11 1935 11/07/11 0915 11/05/11 0853  WBC 17.4* 15.6* 17.2* 14.3* 10.2  PROCALCITON -- -- -- 0.10 --   Cultures:  Antibiotics:  A:  Recent PNA (CAP). He is s/p abx. No current radiographic or clinical evidence of infection. Think demarcation is from bleeding.  P:   -monitor cbc and fever curve.   ENDOCRINE  Lab 11/08/11 0729 11/07/11 2134  GLUCAP 238* 250*   A:  H/o DM P:   -ssi -- increase to resistant scale 6/20  NEUROLOGIC  A:  No focal def, no acute issue P:   -monitor   BEST PRACTICE / DISPOSITION Level of Care:  SDU Primary Service:  PCCM Consultants:   Code Status:  full Diet:  Cl liq DVT Px:  pas GI Px:  ppi Skin Integrity:  intact Social / Family:  Updated.    Danford Bad, NP 11/08/2011  10:24 AM Pager: [phone]  *Care during the described time interval was provided by me and/or other providers on the critical care team. I have reviewed this patient's available data, including medical history, events of note, physical examination and test results as part of my evaluation.   I have fully examined this patient and agree with above findings.    And edited  full  Mcarthur Rossetti. Tyson Alias, MD, FACP Pgr: [phone] [place] [facility]

## 2011-11-08 NOTE — Progress Notes (Signed)
eLink Physician-Brief Progress Note Patient Name: Jeffery Gibson DOB: 1929/04/02 MRN: [id number]  Date of Service  11/08/2011   HPI/Events of Note  Patient with Hgb of 5.9 down from 7.7 s/p rectus sheath bleeding in the setting of anticoagulation.  Has received reversal.    eICU Interventions  Plan: Transfuse 2 unit of pRBCs. Post-transfusion CBC   Intervention Category Intermediate Interventions: Bleeding - evaluation and treatment with blood products  Davone Shinault 11/08/2011, 12:24 AM

## 2011-11-08 NOTE — Clinical Documentation Improvement (Signed)
RENAL FAILURE DOCUMENTATION CLARIFICATION QUERY  THIS DOCUMENT IS NOT A PERMANENT PART OF THE MEDICAL RECORD  TO RESPOND TO THE THIS QUERY, FOLLOW THE INSTRUCTIONS BELOW:  1. If needed, update documentation for the patient's encounter via the notes activity.  2. Access this query again and click edit on the In [employer].  3. After updating, or not, click F2 to complete all highlighted (required) fields concerning your review. Select "additional documentation in the medical record" OR "no additional documentation provided".  4. Click Sign note button.  5. The deficiency will fall out of your In Basket *Please let us know if you are not able to complete this workflow by phone or e-mail (listed below).  Please update your documentation within the medical record to reflect your response to this query.                                                                                    11/08/11  Dear Danford Bad, NP and  Associates  In a better effort to capture your patient's severity of illness, reflect appropriate length of stay and utilization of resources, a review of the patient medical record has revealed the following indicators.    Based on your clinical judgment, please clarify and document in a progress note and/or discharge summary the clinical condition associated with the following supporting information:  In responding to this query please exercise your independent judgment.  The fact that a query is asked, does not imply that any particular answer is desired or expected.  Possible Clinical Conditions  _______Acute Kidney Injury  _______Acute on Chronic Renal Failure  _______Other Condition  _______Cannot Clinically Determine     Supporting Information:  "Chronic renal insuff. Appears as though baseline scr ~2 range. Worsening renal function 6/20" per notes "?worsening renal function after lasix" per notes "Orthostatic Hypotension" per notes IV DDAVP was  given  Diagnostics:  Results for GERALDINE, TESAR (MRN [id number]) as of 11/08/2011 16:04  Ref. Range 10/09/2011 13:43 10/22/2011 09:53 11/07/2011 09:15 11/08/2011 06:15  BUN Latest Range: 6-23 mg/dL 40 (H) 32 (H) 26 (H) 36 (H)  Creat Latest Range: 0.50-1.35 mg/dL 4.09 (H) 2.1 (H) 8.11 (H) 3.26 (H)   Results for JEDEDIAH, NODA (MRN [id number]) as of 11/08/2011 16:04  Ref. Range 11/07/2011 09:15 11/08/2011 06:15  GFR calc non Af Amer Latest Range: >90 mL/min 31 (L) 17 (L)   Results for DADRIAN, BALLANTINE (MRN [id number]) as of 11/08/2011 16:04  Ref. Range 10/22/2011 09:53 11/07/2011 09:15 11/08/2011 06:15  Sodium Latest Range: 135-145 mEq/L 139 128 (L) 129 (L)     Treatments: Monitoring labs Strict I&O NS@75cc /hr                   You may use possible, probable, or suspect with inpatient documentation. possible, probable, suspected diagnoses MUST be documented at the time of discharge  Reviewed: Query was answered  Thank You,     Rossie Muskrat RN, BSN  Clinical Documentation Specialist Pager:  [phone] [email or web address]  Health Information Management [place]

## 2011-11-08 NOTE — Progress Notes (Signed)
CRITICAL VALUE ALERT  Critical value received:  Hgb = 5.9  Date of notification:  11/08/11  Time of notification:  00:18  Critical value read back: Yes   Nurse who received alert:  Guinevere Scarlet, RN  MD notified (1st page):  Pola Corn, Dr. Darrick Penna  Time of first page:  00:18  MD notified (2nd page):  Time of second page:  Responding MD:  Dr. Darrick Penna  Time MD responded:  00:18

## 2011-11-08 NOTE — Progress Notes (Signed)
eLink Physician-Brief Progress Note Patient Name: ANDRUS SHARP DOB: 05/17/1929 MRN: [id number]  Date of Service  11/08/2011   HPI/Events of Note   Hgb 6.4  eICU Interventions  Transfuse two more units PRBC .   Intervention Category Intermediate Interventions: Bleeding - evaluation and treatment with blood products  Shan Levans 11/08/2011, 5:46 PM

## 2011-11-08 NOTE — Progress Notes (Signed)
Utilization review completed.  

## 2011-11-08 NOTE — Progress Notes (Signed)
CARE MANAGEMENT NOTE 11/08/2011  Patient:  Jeffery Gibson, [name]   Account Number:  [id number]  Date Initiated:  11/08/2011  Documentation initiated by:  Laurajean Hosek  Subjective/Objective Assessment:   pt with anemia and recent history of pna and abd pain, hgb down to 5.9 from 7.7 after transfusion.     Action/Plan:   lives at home   Anticipated DC Date:  11/11/2011   Anticipated DC Plan:  HOME/SELF CARE  In-house referral  NA      DC Planning Services  NA      [facility] Choice  NA   Choice offered to / List presented to:  NA   DME arranged  NA      DME agency  NA     HH arranged  NA      HH agency  NA   Status of service:  In process, will continue to follow Medicare Important Message given?  YES (If response is "NO", the following Medicare IM given date fields will be blank) Date Medicare IM given:  11/07/2011 Date Additional Medicare IM given:    Discharge Disposition:    Per UR Regulation:  Reviewed for med. necessity/level of care/duration of stay  If discussed at Long Length of Stay Meetings, dates discussed:    Comments:  [id number] Marcelle Smiling, RN, BSN, CCM No discharge needs present at time of this review Case Management [phone]

## 2011-11-09 ENCOUNTER — Inpatient Hospital Stay (HOSPITAL_COMMUNITY): Payer: Medicare Other

## 2011-11-09 ENCOUNTER — Telehealth: Payer: Self-pay | Admitting: *Deleted

## 2011-11-09 DIAGNOSIS — I959 Hypotension, unspecified: Secondary | ICD-10-CM

## 2011-11-09 DIAGNOSIS — I2510 Atherosclerotic heart disease of native coronary artery without angina pectoris: Secondary | ICD-10-CM

## 2011-11-09 DIAGNOSIS — E119 Type 2 diabetes mellitus without complications: Secondary | ICD-10-CM

## 2011-11-09 DIAGNOSIS — I480 Paroxysmal atrial fibrillation: Secondary | ICD-10-CM | POA: Diagnosis present

## 2011-11-09 LAB — PROTIME-INR
INR: 1.26 (ref 0.00–1.49)
Prothrombin Time: 16.1 seconds — ABNORMAL HIGH (ref 11.6–15.2)

## 2011-11-09 LAB — CBC
HCT: 21 % — ABNORMAL LOW (ref 39.0–52.0)
HCT: 21.4 % — ABNORMAL LOW (ref 39.0–52.0)
HCT: 22.1 % — ABNORMAL LOW (ref 39.0–52.0)
Hemoglobin: 7.3 g/dL — ABNORMAL LOW (ref 13.0–17.0)
Hemoglobin: 7.3 g/dL — ABNORMAL LOW (ref 13.0–17.0)
Hemoglobin: 7.6 g/dL — ABNORMAL LOW (ref 13.0–17.0)
MCH: 29 pg (ref 26.0–34.0)
MCHC: 34.1 g/dL (ref 30.0–36.0)
MCHC: 34.8 g/dL (ref 30.0–36.0)
MCV: 84.9 fL (ref 78.0–100.0)
Platelets: 152 10*3/uL (ref 150–400)
RBC: 2.41 MIL/uL — ABNORMAL LOW (ref 4.22–5.81)
RBC: 2.58 MIL/uL — ABNORMAL LOW (ref 4.22–5.81)
RDW: 16.4 % — ABNORMAL HIGH (ref 11.5–15.5)
RDW: 16.5 % — ABNORMAL HIGH (ref 11.5–15.5)
RDW: 16.6 % — ABNORMAL HIGH (ref 11.5–15.5)
RDW: 16.6 % — ABNORMAL HIGH (ref 11.5–15.5)
WBC: 15.8 10*3/uL — ABNORMAL HIGH (ref 4.0–10.5)
WBC: 16.2 10*3/uL — ABNORMAL HIGH (ref 4.0–10.5)
WBC: 18.3 10*3/uL — ABNORMAL HIGH (ref 4.0–10.5)

## 2011-11-09 LAB — BASIC METABOLIC PANEL
Calcium: 7.8 mg/dL — ABNORMAL LOW (ref 8.4–10.5)
GFR calc Af Amer: 12 mL/min — ABNORMAL LOW (ref >90)
GFR calc non Af Amer: 11 mL/min — ABNORMAL LOW (ref >90)
Glucose, Bld: 167 mg/dL — ABNORMAL HIGH (ref 70–99)
Potassium: 4.1 mEq/L (ref 3.5–5.1)
Sodium: 129 mEq/L — ABNORMAL LOW (ref 135–145)

## 2011-11-09 LAB — GLUCOSE, CAPILLARY: Glucose-Capillary: 175 mg/dL — ABNORMAL HIGH (ref 70–99)

## 2011-11-09 MED ORDER — AMIODARONE HCL 200 MG PO TABS
200.0000 mg | ORAL_TABLET | Freq: Every day | ORAL | Status: DC
  Administered 2011-11-09 – 2011-11-19 (×11): 200 mg via ORAL
  Filled 2011-11-09 (×11): qty 1

## 2011-11-09 MED ORDER — HYDROCOD POLST-CHLORPHEN POLST 10-8 MG/5ML PO LQCR
5.0000 mL | Freq: Two times a day (BID) | ORAL | Status: DC | PRN
  Administered 2011-11-10 – 2011-11-19 (×12): 5 mL via ORAL
  Filled 2011-11-09 (×13): qty 5

## 2011-11-09 MED ORDER — PANTOPRAZOLE SODIUM 40 MG PO TBEC
40.0000 mg | DELAYED_RELEASE_TABLET | Freq: Every day | ORAL | Status: DC
  Administered 2011-11-10 – 2011-11-19 (×10): 40 mg via ORAL
  Filled 2011-11-09 (×10): qty 1

## 2011-11-09 MED ORDER — FINASTERIDE 5 MG PO TABS
5.0000 mg | ORAL_TABLET | Freq: Every day | ORAL | Status: DC
  Administered 2011-11-09 – 2011-11-19 (×11): 5 mg via ORAL
  Filled 2011-11-09 (×11): qty 1

## 2011-11-09 MED ORDER — GLIMEPIRIDE 1 MG PO TABS
1.0000 mg | ORAL_TABLET | Freq: Every day | ORAL | Status: DC
  Administered 2011-11-10 – 2011-11-11 (×2): 1 mg via ORAL
  Filled 2011-11-09 (×4): qty 1

## 2011-11-09 MED ORDER — METOPROLOL TARTRATE 50 MG PO TABS
50.0000 mg | ORAL_TABLET | Freq: Two times a day (BID) | ORAL | Status: DC
  Administered 2011-11-09 – 2011-11-19 (×19): 50 mg via ORAL
  Filled 2011-11-09 (×22): qty 1

## 2011-11-09 MED ORDER — URSODIOL 300 MG PO CAPS
300.0000 mg | ORAL_CAPSULE | Freq: Two times a day (BID) | ORAL | Status: DC
  Administered 2011-11-09 – 2011-11-19 (×20): 300 mg via ORAL
  Filled 2011-11-09 (×22): qty 1

## 2011-11-09 NOTE — Telephone Encounter (Signed)
Which hospital?

## 2011-11-09 NOTE — Progress Notes (Signed)
Name: Jeffery Gibson MRN: [id number] DOB: 1929-01-29    LOS: 2  PULMONARY / CRITICAL CARE MEDICINE  Profile:  78 year old male w/ h/o prostate cancer and chronic AF, recently scheduled for procedure on prostate at [facility]. He had his coumadin stopped and was bridging with lovenox. Admitted on 6/19 w/ large left rectus sheath hematoma, acute blood loss anemia and hypotension.   Subjective/Overnight:  Still c/o abd pain, also c/o mild nausea. No distress. Intermittent AF   Vital Signs: Temp:  [97.3 F (36.3 C)-100.1 F (37.8 C)] 97.9 F (36.6 C) (06/21 1200) Pulse Rate:  [99-121] 104  (06/21 1000) Resp:  [17-25] 19  (06/21 1000) BP: (99-137)/(38-57) 110/42 mmHg (06/21 1200) SpO2:  [94 %-100 %] 95 % (06/21 1000) Weight:  [121.3 kg (267 lb 6.7 oz)] 121.3 kg (267 lb 6.7 oz) (06/21 0000) room air   Physical Examination: General:  Obese 78 year old male, currently reports abd pain, but in no distress.  Neuro:  Awake and oriented w/ no focal def HEENT:  MMM no JVD Neck:  No adenopathy Cardiovascular:  Regular irregular. Has AF and A flutter on tele Lungs:  Clear Abdomen:  Obese, firm palp area on left upper/lower quadrant noted unchanged. Ecchymosis both lower quads, marked  Musculoskeletal:  intact Skin:  LE edema  BMET    Component Value Date/Time   NA 129* 11/09/2011 0530   NA 141 02/09/2010 1156   K 4.1 11/09/2011 0530   K 4.5 02/09/2010 1156   CL 95* 11/09/2011 0530   CL 98 02/09/2010 1156   CO2 22 11/09/2011 0530   CO2 25 02/09/2010 1156   GLUCOSE 167* 11/09/2011 0530   GLUCOSE 141* 02/09/2010 1156   BUN 50* 11/09/2011 0530   BUN 36* 02/09/2010 1156   CREATININE 4.66* 11/09/2011 0530   CREATININE 1.7* 02/09/2010 1156   CALCIUM 7.8* 11/09/2011 0530   CALCIUM 8.6 02/09/2010 1156   GFRNONAA 11* 11/09/2011 0530   GFRAA 12* 11/09/2011 0530    CBC Lab Results  Component Value Date   WBC 16.2* 11/09/2011   HGB 7.0* 11/09/2011   HCT 20.6* 11/09/2011   MCV 85.5 11/09/2011   PLT 152  11/09/2011     Active Problems:  Rectus sheath hematoma - no evidence of ongoing bleeding  Acute blood loss anemia - Hgb 7.0 6/21  Hypotension - resolved  Acute on chronic renal failure - due to shock. Now non-oliguric  Coagulopathy - corrected  Diabetes mellitus - controlled  CAD (coronary artery disease) - no evidence of active ischemia presently  HTN (hypertension) - not a problem presently. Holding antihypertensives  Paroxysmal a-fib -   Prostate cancer  IMP/PLAN: Transfuse one unit PRBCs. Monitor CBC, coags Monitor renal panel - unlikely to require HD. Avoid nephrotoxins Resume amiodarone and metoprolol Hold other antihypertensives Cont SSI. Resume Amaryl Transfer to Tele and to [facility] service - discussed with them 6/21. PCCM will sign off as of AM 6/22    Billy Fischer, MD ; [facility] service Mobile [phone].  After 5:30 PM or weekends, call [phone]

## 2011-11-09 NOTE — Telephone Encounter (Signed)
[facility] pt in Rm [id number]

## 2011-11-09 NOTE — Telephone Encounter (Signed)
Call from Mrs. Packard, pt was admitted on 06/19. Surgery at [facility] has been postponed until d/c from Hospital  Returned pt's call lmovm her message was received and will be given to MD for review.

## 2011-11-09 NOTE — Progress Notes (Signed)
Mr. Vonada, [place], Full code with history of prostate cancer.  Admitted 11/07/11 with near syncope and abdominal hematoma.  Tenderness and taut abdomen with ecchymosis noted on RN shift assessment.  BUN 43, Creat 4.12, GFR 12.  Scant urinary output, measured by catheter.  PRN fentanyl for pain and benzonatate for cough given.  Hgb of 6.4 was treated with transfusion of  2 units RBCs.  Hgb 7.3 after transfusion.

## 2011-11-10 ENCOUNTER — Inpatient Hospital Stay (HOSPITAL_COMMUNITY): Payer: Medicare Other

## 2011-11-10 DIAGNOSIS — I4892 Unspecified atrial flutter: Secondary | ICD-10-CM

## 2011-11-10 DIAGNOSIS — R112 Nausea with vomiting, unspecified: Secondary | ICD-10-CM

## 2011-11-10 DIAGNOSIS — M7981 Nontraumatic hematoma of soft tissue: Secondary | ICD-10-CM

## 2011-11-10 DIAGNOSIS — N170 Acute kidney failure with tubular necrosis: Secondary | ICD-10-CM

## 2011-11-10 DIAGNOSIS — I959 Hypotension, unspecified: Secondary | ICD-10-CM

## 2011-11-10 LAB — URINE CULTURE
Colony Count: >=100000
Culture  Setup Time: 201306200127

## 2011-11-10 LAB — PROTIME-INR: INR: 1.13 (ref 0.00–1.49)

## 2011-11-10 LAB — BASIC METABOLIC PANEL
BUN: 59 mg/dL — ABNORMAL HIGH (ref 6–23)
CO2: 23 mEq/L (ref 19–32)
Chloride: 91 mEq/L — ABNORMAL LOW (ref 96–112)
GFR calc Af Amer: 11 mL/min — ABNORMAL LOW (ref >90)
Glucose, Bld: 155 mg/dL — ABNORMAL HIGH (ref 70–99)
Potassium: 4 mEq/L (ref 3.5–5.1)

## 2011-11-10 LAB — OSMOLALITY: Osmolality: 283 mOsm/kg (ref 275–300)

## 2011-11-10 LAB — TYPE AND SCREEN
Unit division: 0
Unit division: 0
Unit division: 0

## 2011-11-10 LAB — GLUCOSE, CAPILLARY
Glucose-Capillary: 166 mg/dL — ABNORMAL HIGH (ref 70–99)
Glucose-Capillary: 170 mg/dL — ABNORMAL HIGH (ref 70–99)
Glucose-Capillary: 145 mg/dL — ABNORMAL HIGH (ref 70–99)
Glucose-Capillary: 99 mg/dL (ref 70–99)

## 2011-11-10 LAB — CBC
HCT: 21.7 % — ABNORMAL LOW (ref 39.0–52.0)
Hemoglobin: 7.5 g/dL — ABNORMAL LOW (ref 13.0–17.0)
MCH: 29.5 pg (ref 26.0–34.0)
MCHC: 34.6 g/dL (ref 30.0–36.0)
MCV: 85.4 fL (ref 78.0–100.0)
Platelets: 165 10*3/uL (ref 150–400)
RBC: 2.54 MIL/uL — ABNORMAL LOW (ref 4.22–5.81)
RDW: 16.4 % — ABNORMAL HIGH (ref 11.5–15.5)
WBC: 14.4 10*3/uL — ABNORMAL HIGH (ref 4.0–10.5)

## 2011-11-10 LAB — MRSA PCR SCREENING: MRSA by PCR: NEGATIVE

## 2011-11-10 MED ORDER — FUROSEMIDE 10 MG/ML IJ SOLN
80.0000 mg | Freq: Once | INTRAMUSCULAR | Status: AC
  Administered 2011-11-10: 80 mg via INTRAVENOUS
  Filled 2011-11-10 (×2): qty 8

## 2011-11-10 MED ORDER — PIPERACILLIN-TAZOBACTAM IN DEX 2-0.25 GM/50ML IV SOLN
2.2500 g | Freq: Three times a day (TID) | INTRAVENOUS | Status: DC
  Administered 2011-11-10 – 2011-11-11 (×3): 2.25 g via INTRAVENOUS
  Filled 2011-11-10 (×6): qty 50

## 2011-11-10 NOTE — Progress Notes (Signed)
Call to Dr. Gonzella Lex as pt with persistent low BP. He is alert with no changes in mentation. Order to hold IV lasix but nurse to give once pt arrives to ICU and BP if BP is stable. Instructions passed Armanda Heritage nurse accepting report. Pt and family (at bedside) understand need to transfer as BP is low and renal functions are high.

## 2011-11-10 NOTE — Progress Notes (Signed)
ANTIBIOTIC CONSULT NOTE - INITIAL  Pharmacy Consult for Zosyn Indication: pseudomonas UTI  Allergies  Allergen Reactions  . Bextra (Valdecoxib) Diarrhea  . Latex Hives  . Metformin And Related Nausea Only  . Mevacor (Lovastatin) Nausea And Vomiting  . Ramipril Cough    Patient Measurements: Height: 5\' 8"  (172.7 cm) Weight: 276 lb 10.8 oz (125.5 kg) IBW/kg (Calculated) : 68.4   Vital Signs: Temp: 98.1 F (36.7 C) (06/22 0652) Temp src: Oral (06/22 0652) BP: 127/71 mmHg (06/22 0652) Pulse Rate: 71  (06/22 0652) Intake/Output from previous day: 06/21 0701 - 06/22 0700 In: 1192.5 [P.O.:680; I.V.:250; Blood:262.5] Out: 453 [Urine:453] Intake/Output from this shift: Total I/O In: 240 [P.O.:240] Out: -   Labs:  Basename 11/10/11 1215 11/10/11 0533 11/09/11 1900 11/09/11 1121 11/09/11 0530 11/08/11 1725  WBC 14.4* -- 15.8* 16.2* -- --  HGB 7.5* -- 7.6* 7.0* -- --  PLT 165 -- 158 152 -- --  LABCREA -- -- -- -- -- --  CREATININE -- 5.13* -- -- 4.66* 4.12*   Estimated Creatinine Clearance: 14.8 ml/min (by C-G formula based on Cr of 5.13). No results found for this basename: VANCOTROUGH:2,VANCOPEAK:2,VANCORANDOM:2,GENTTROUGH:2,GENTPEAK:2,GENTRANDOM:2,TOBRATROUGH:2,TOBRAPEAK:2,TOBRARND:2,AMIKACINPEAK:2,AMIKACINTROU:2,AMIKACIN:2, in the last 72 hours   Microbiology: Recent Results (from the past 720 hour(s))  URINE CULTURE     Status: Normal   Collection Time   11/07/11  1:11 PM      Component Value Range Status Comment   Specimen Description URINE, CATHETERIZED   Final    Special Requests NONE   Final    Culture  Setup Time [id number]   Final    Colony Count >=100,000 COLONIES/ML   Final    Culture PSEUDOMONAS AERUGINOSA   Final    Report Status 11/10/2011 FINAL   Final    Organism ID, Bacteria PSEUDOMONAS AERUGINOSA   Final   MRSA PCR SCREENING     Status: Normal   Collection Time   11/07/11  6:27 PM      Component Value Range Status Comment   MRSA by PCR NEGATIVE   NEGATIVE Final     Medical History: Past Medical History  Diagnosis Date  . Ischemic heart disease 05/06/06    post CARG 05/06/06  . Atrial flutter     afib and atrial flutter (permanent)  . Tachycardia-bradycardia 1997    dual-chamber/for tachybradycardia syndrome  . Obese     exogenous  . Renal insufficiency   . Anemia of chronic disease     aranesp injections  . Dyslipidemia   . Coronary artery disease   . Anemia associated with chronic renal failure 04/05/2011  . Cancer     prostate/on Lupron  . Adenocarcinoma of colon 11/2003    stage 2(T3,N0,M0)  . Diabetes mellitus   . Hypertension     Medications:  Scheduled:    . amiodarone  200 mg Oral Daily  . antiseptic oral rinse  15 mL Mouth Rinse BID  . docusate sodium  100 mg Oral Daily  . finasteride  5 mg Oral Daily  . glimepiride  1 mg Oral Q breakfast  . insulin aspart  0-20 Units Subcutaneous TID WC  . insulin glargine  5 Units Subcutaneous Daily  . metoprolol  50 mg Oral BID  . pantoprazole  40 mg Oral Q1200  . ursodiol  300 mg Oral BID  . DISCONTD: pantoprazole  40 mg Oral Q1200   Infusions:   Assessment: 78 yo male with a pseudomonas UTI to start Zosyn per pharmacy dosing  Goal  of Therapy:  adjustment per renal function  Plan:   Zosyn 2.25g IV q8 for CrCl < 20 ml/min   Hessie Knows, PharmD, BCPS Pager [phone] 11/10/2011 1:20 PM

## 2011-11-10 NOTE — Progress Notes (Signed)
[facility] here to transport pt to [phone]. Will give Fent for pain. Family at bedside, pt very pleasant and appreciative of care.

## 2011-11-10 NOTE — Consult Note (Addendum)
CARDIOLOGY CONSULT NOTE  Patient ID: Jeffery Gibson MRN: [id number] DOB/AGE: [birth year] 78 y.o.  Admit date: 11/07/2011 Primary Physician: Janace Hoard Primary Cardiologist: Patty Sermons Reason for Consultation: Atrial flutter, rectus sheath hematoma.   HPI:  78 year old male with h/o prostate cancer, CAD s/p CABG and paroxysmal AF was scheduled for orchiectomy for prostate cancer at [facility] which was cancelled because he had developed a pneumonia. He had his coumadin stopped and was bridging with lovenox. He had been rescheduled to have procedure, but was not getting better so he was started back on his coumadin 6/18. He was also still getting lovenox for bridging. The am of 6/19, he was up to the bathroom when he felt weak and dizzy. Denies LOC, but stated he felt like he was going to pass out. His wife helped him back to bed and EMS was called. Did report significant left sided abd pain. He was orthostatic. He had a CT of abd/pelvis to eval pain. This showed a left rectus sheath hematoma.  He was admitted and anticoagulation was reversed.  He required FFP and vitamin K.  He became hypotensive with blood loss anemia requiring transfusions.  Baseline creatinine of 2.1 has risen to 5.1. Poor urine output (453 cc yesterday).  He has been in atrial flutter it appears since admission.  Rate is controlled.  He has been found to have a pseudomonas UTI.  He still has considerable left-sided abdominal pain and has not yet been out of bed.    Review of systems complete and found to be negative unless listed above in HPI  Past Medical History: 1. CAD: s/p CABG 12/07. 2. Paroxysmal atrial fibrillation 3. Tachy-brady syndrome with dual chamber Medtronic pacemaker.  4. Prostate cancer: plan for orchiectomy. 5. Hyperlipidemia 6. CKD: creatinine 2.1 7. Cholelithiasis  8. DM 9. Colon cancer   Family History  Problem Relation Age of Onset  . Heart failure Father 14  . Gallbladder disease Father     . COPD    . Coronary artery disease    . Emphysema      History   Social History  . Marital Status: Married    Spouse Name: N/A    Number of Children: N/A  . Years of Education: N/A   Occupational History  . Not on file.   Social History Main Topics  . Smoking status: Former Smoker    Types: Cigarettes    Quit date: 05/22/1963  . Smokeless tobacco: Never Used  . Alcohol Use: No  . Drug Use: Not on file  . Sexually Active: Not Currently   Other Topics Concern  . Not on file   Social History Narrative  . No narrative on file     Prescriptions prior to admission  Medication Sig Dispense Refill  . AMARYL 1 MG tablet TAKE 1 TABLET DAILY.  90 each  3  . amiodarone (PACERONE) 200 MG tablet TAKE (1/2) TABLET DAILY.  45 tablet  4  . Ascorbic Acid (VITAMIN C PO) Take 1 tablet by mouth daily.        Marland Kitchen aspirin 81 MG tablet Take 81 mg by mouth daily.        . benzonatate (TESSALON) 100 MG capsule Take 100-200 mg by mouth 3 (three) times daily as needed. Use 1-2 tablets 3 times daily as necessary for cough. May be used with other cough medicines if needed.      . cefdinir (OMNICEF) 300 MG capsule Take 300 mg by  mouth 2 (two) times daily.      . chlorpheniramine-HYDROcodone (TUSSIONEX) 10-8 MG/5ML LQCR Take 5 mLs by mouth every 12 (twelve) hours as needed. Cough      . Cholecalciferol (VITAMIN D) 2000 UNITS tablet Take 2,000 Units by mouth daily.        . Darbepoetin Alfa-Albumin (ARANESP IJ) Inject 1 Syringe as directed See admin instructions. Every 4 weeks as needed for anemia las given 09-2011      . enoxaparin (LOVENOX) 120 MG/0.8ML injection Inject 0.8 mLs (120 mg total) into the skin every 12 (twelve) hours.  10 Syringe  1  . FINASTERIDE PO Take 5 mg by mouth at bedtime.       . hydrochlorothiazide (HYDRODIURIL) 25 MG tablet       . ibuprofen (ADVIL,MOTRIN) 600 MG tablet 1 daily with food as needed  90 tablet  0  . ipratropium (ATROVENT HFA) 17 MCG/ACT inhaler Inhale 2 puffs  into the lungs 3 (three) times daily.      Marland Kitchen leuprolide (LUPRON) 22.5 MG injection Inject 22.5 mg into the muscle See admin instructions. Pt gets injection every 6 months for prostate cancer last given april11      . metoprolol (LOPRESSOR) 50 MG tablet Take 50 mg by mouth 2 (two) times daily.      . Multiple Vitamin (MULTIVITAMIN) tablet Take 1 tablet by mouth daily.        Marland Kitchen PRILOSEC 20 MG capsule TAKE 1-2 CAPSULES DAILY.  180 each  3  . rosuvastatin (CRESTOR) 10 MG tablet Take 5 mg by mouth See admin instructions. 1/2 tablet Monday Wednesday and Friday and pt takes nothing  On rest of days      . Tamsulosin HCl (FLOMAX) 0.4 MG CAPS Take 0.4 mg by mouth daily after supper.       . telmisartan (MICARDIS) 80 MG tablet Take 80 mg by mouth daily.      . ursodiol (ACTIGALL) 300 MG capsule Take 300 mg by mouth 2 (two) times daily.       Marland Kitchen warfarin (COUMADIN) 5 MG tablet Take 5 mg by mouth See admin instructions. Pt takes 1 tablet by mouth every day except Wednesday and Thursday pt takes none      . nitroGLYCERIN (NITROSTAT) 0.4 MG SL tablet Place 1 tablet (0.4 mg total) under the tongue every 5 (five) minutes as needed for chest pain.  100 tablet  3   Current Facility-Administered Medications  Medication Dose Route Frequency Provider Last Rate Last Dose  . 0.9 %  sodium chloride infusion  250 mL Intravenous PRN Simonne Martinet, NP      . acetaminophen (TYLENOL) tablet 650 mg  650 mg Oral Q4H PRN Kalman Shan, MD   650 mg at 11/07/11 1908  . amiodarone (PACERONE) tablet 200 mg  200 mg Oral Daily Merwyn Katos, MD   200 mg at 11/10/11 0908  . antiseptic oral rinse (BIOTENE) solution 15 mL  15 mL Mouth Rinse BID Storm Frisk, MD   15 mL at 11/10/11 0800  . benzonatate (TESSALON) capsule 100 mg  100 mg Oral TID PRN Zigmund Gottron, MD   100 mg at 11/09/11 2154  . bisacodyl (DULCOLAX) suppository 10 mg  10 mg Rectal Daily PRN Storm Frisk, MD      . chlorpheniramine-HYDROcodone  (TUSSIONEX) 10-8 MG/5ML suspension 5 mL  5 mL Oral Q12H PRN Merwyn Katos, MD   5 mL at 11/10/11 0923  . docusate  sodium (COLACE) capsule 100 mg  100 mg Oral Daily Storm Frisk, MD   100 mg at 11/10/11 0909  . fentaNYL (SUBLIMAZE) injection 25-75 mcg  25-75 mcg Intravenous Q2H PRN Bernadene Person, NP      . finasteride (PROSCAR) tablet 5 mg  5 mg Oral Daily Merwyn Katos, MD   5 mg at 11/10/11 0910  . glimepiride (AMARYL) tablet 1 mg  1 mg Oral Q breakfast Merwyn Katos, MD   1 mg at 11/10/11 0830  . insulin aspart (novoLOG) injection 0-20 Units  0-20 Units Subcutaneous TID WC Bernadene Person, NP   4 Units at 11/09/11 1824  . insulin glargine (LANTUS) injection 5 Units  5 Units Subcutaneous Daily Nelda Bucks, MD   5 Units at 11/10/11 1000  . metoprolol (LOPRESSOR) tablet 50 mg  50 mg Oral BID Merwyn Katos, MD   50 mg at 11/10/11 0908  . ondansetron (ZOFRAN) injection 4 mg  4 mg Intravenous Q6H PRN Nelda Bucks, MD   4 mg at 11/10/11 1353  . pantoprazole (PROTONIX) EC tablet 40 mg  40 mg Oral Q1200 Merwyn Katos, MD      . piperacillin-tazobactam (ZOSYN) IVPB 2.25 g  2.25 g Intravenous Q8H [address] [place], [place]      . ursodiol (ACTIGALL) capsule 300 mg  300 mg Oral BID Merwyn Katos, MD   300 mg at 11/10/11 0909  . DISCONTD: pantoprazole (PROTONIX) EC tablet 40 mg  40 mg Oral Q1200 Nelda Bucks, MD   40 mg at 11/09/11 1237    Physical exam Blood pressure 127/71, pulse 71, temperature 98.1 F (36.7 C), temperature source Oral, resp. rate 20, height 5\' 8"  (1.727 m), weight 125.5 kg (276 lb 10.8 oz), SpO2 97.00%. General: NAD, obese Neck: Thick neck, JVP does not seem markedly elevated but difficult to tell;  no thyromegaly or thyroid nodule.  Lungs: Decreased breath sounds at bases bilaterally.  CV: Nondisplaced PMI.  Heart irregular S1/S2, no S3/S4, no murmur.  Trace ankle edema.  No carotid bruit.   Abdomen: Distended with ecchymosis along  the left flank and LLQ.  2+ pitting edema left flank.  Tender LLQ.   Skin: Bruising in abdominal wall as above.  Neurologic: Alert and oriented x 3.  Psych: Normal affect. Extremities: No clubbing or cyanosis.  HEENT: Normal.   Labs:   Lab Results  Component Value Date   WBC 14.4* 11/10/2011   HGB 7.5* 11/10/2011   HCT 21.7* 11/10/2011   MCV 85.4 11/10/2011   PLT 165 11/10/2011    Lab 11/10/11 0533 11/07/11 0915  NA 125* --  K 4.0 --  CL 91* --  CO2 23 --  BUN 59* --  CREATININE 5.13* --  CALCIUM 8.1* --  PROT -- 6.4  BILITOT -- 0.6  ALKPHOS -- 73  ALT -- 9  AST -- 11  GLUCOSE 155* --   Lab Results  Component Value Date   CKTOTAL 170 11/08/2011   CKMB 3.0 11/08/2011   TROPONINI <0.30 11/08/2011    Lab Results  Component Value Date   CHOL 141 10/22/2011   CHOL 173 07/24/2011   CHOL 148 03/27/2011   Lab Results  Component Value Date   HDL 42.20 10/22/2011   HDL 41.20 07/24/2011   HDL 42.50 03/27/2011   Lab Results  Component Value Date   LDLCALC 66 10/22/2011   LDLCALC 73 03/27/2011   LDLCALC 28 12/25/2010  Lab Results  Component Value Date   TRIG 166.0* 10/22/2011   TRIG 270.0* 07/24/2011   TRIG 162.0* 03/27/2011   Lab Results  Component Value Date   CHOLHDL 3 10/22/2011   CHOLHDL 4 07/24/2011   CHOLHDL 3 03/27/2011   Lab Results  Component Value Date   LDLDIRECT 96.6 07/24/2011      Radiology: CXR with left-sided pleural effusion  EKG: Atrial flutter in 70s  ASSESSMENT AND PLAN:  78 yo with history of atrial fibrillation/flutter (paroxysmal) and CAD s/p CABG has presented with a rectus sheath hematoma.  There was considerable blood loss with hypotension and AKI likely due to ATN.   1. Atrial flutter/fibrillation: Has been paroxysmal in past but appears persistent.  It has been rate controlled here with amiodarone and metoprolol.  No anticoagulation at this time for obvious reasons. If he remains in atrial fibrillation without conversion back to NSR would consider so 2.  Rectus sheath hematoma: Quite severe with hypotension and blood loss anemia.  Suspect stable now.  Would transfuse hgb < 7 as long as there is no active bleeding.  3. CAD s/p CABG: Stable.  4. Acute on chronic kidney injury: Suspect ATN with hypotension.  Baseline creatinine is 2.1.  He is 5.1 today.  He is net very positive with I/Os.  Renal consult seeing patient currently. He does not appear markedly intravascular volume overloaded and is breathing comfortably.  However, could consider high dose of Lasix to try to initiate diuresis to lower renal venous pressure and perhaps improve creatinine.  Will leave decision to Dr. Arlean Hopping. Will get echo for LV/RV fxn.   Jeffery Gibson 11/10/2011 2:23 PM

## 2011-11-10 NOTE — Progress Notes (Signed)
Spoke with Dr. Gonzella Lex regarding pt's BP, states he will speak to Dr. Adella Hare and call writer back. Hold Lasix for now.

## 2011-11-10 NOTE — Progress Notes (Signed)
Subjective: Patient transferred from PCCM. Patient seen and examined this morning. Has persistent abdominal pain over abdominal wall hematoma relieved with pain meds. Noted for worsening renal function and oliguria for past 2 days . Denies chest pain or SOB. family at bedside  Objective:  Vital signs in last 24 hours:  Filed Vitals:   11/09/11 1900 11/10/11 0125 11/10/11 0652 11/10/11 1444  BP: 120/65 125/68 127/71 91/42  Pulse: 82 77 71 68  Temp: 98.2 F (36.8 C) 98.2 F (36.8 C) 98.1 F (36.7 C) 97.6 F (36.4 C)  TempSrc: Oral Oral Oral Oral  Resp: 20 20 20 20   Height:      Weight:   125.5 kg (276 lb 10.8 oz)   SpO2: 96% 96% 97% 98%    Intake/Output from previous day:   Intake/Output Summary (Last 24 hours) at 11/10/11 1544 Last data filed at 11/10/11 1448  Gross per 24 hour  Intake    560 ml  Output    550 ml  Net     10 ml    Physical Exam:  General: elderly obese male  in no acute distress. HEENT: no pallor, no icterus, moist oral mucosa, no JVD, no lymphadenopathy Heart: Normal  s1 &s2  Regular rate and rhythm, without murmurs, rubs, gallops. Lungs: poor respiratory effort and diminished breath sounds to to body habitus Abdomen: Soft, mildly distended, bruising over  periumbilical area unchanged. Tense over left rectal sheath hematoma which is tender to palation , positive bowel sounds. Extremities: No clubbing cyanosis or edema with positive pedal pulses. 1+ edema. Foley in palce Neuro: Alert, awake, oriented x3, nonfocal.   Lab Results:  Basic Metabolic Panel:    Component Value Date/Time   NA 125* 11/10/2011 0533   NA 141 02/09/2010 1156   K 4.0 11/10/2011 0533   K 4.5 02/09/2010 1156   CL 91* 11/10/2011 0533   CL 98 02/09/2010 1156   CO2 23 11/10/2011 0533   CO2 25 02/09/2010 1156   BUN 59* 11/10/2011 0533   BUN 36* 02/09/2010 1156   CREATININE 5.13* 11/10/2011 0533   CREATININE 1.7* 02/09/2010 1156   GLUCOSE 155* 11/10/2011 0533   GLUCOSE 141* 02/09/2010  1156   CALCIUM 8.1* 11/10/2011 0533   CALCIUM 8.6 02/09/2010 1156   CBC:    Component Value Date/Time   WBC 14.4* 11/10/2011 1215   WBC 10.2 11/05/2011 0853   WBC 8.1 10/18/2011 0907   WBC 7.5 03/02/2010 1247   HGB 7.5* 11/10/2011 1215   HGB 11.1* 11/05/2011 0853   HGB 10.6* 10/18/2011 0907   HGB 11.7* 03/02/2010 1247   HCT 21.7* 11/10/2011 1215   HCT 35.6* 11/05/2011 0853   HCT 31.9* 10/18/2011 0907   HCT 33.5* 03/02/2010 1247   PLT 165 11/10/2011 1215   PLT 148 10/18/2011 0907   PLT 163 03/02/2010 1247   MCV 85.4 11/10/2011 1215   MCV 91.0 11/05/2011 0853   MCV 91.1 10/18/2011 0907   MCV 90 03/02/2010 1247   NEUTROABS 12.9* 11/07/2011 0915   NEUTROABS 6.4 10/18/2011 0907   NEUTROABS 5.8 03/02/2010 1247   LYMPHSABS 0.6* 11/07/2011 0915   LYMPHSABS 0.7* 10/18/2011 0907   LYMPHSABS 0.9 03/02/2010 1247   MONOABS 0.8 11/07/2011 0915   MONOABS 0.7 10/18/2011 0907   EOSABS 0.0 11/07/2011 0915   EOSABS 0.2 10/18/2011 0907   EOSABS 0.2 03/02/2010 1247   BASOSABS 0.0 11/07/2011 0915   BASOSABS 0.0 10/18/2011 0907   BASOSABS 0.0 03/02/2010 1247  Recent Results (from the past 240 hour(s))  URINE CULTURE     Status: Normal   Collection Time   11/07/11  1:11 PM      Component Value Range Status Comment   Specimen Description URINE, CATHETERIZED   Final    Special Requests NONE   Final    Culture  Setup Time [id number]   Final    Colony Count >=100,000 COLONIES/ML   Final    Culture PSEUDOMONAS AERUGINOSA   Final    Report Status 11/10/2011 FINAL   Final    Organism ID, Bacteria PSEUDOMONAS AERUGINOSA   Final   MRSA PCR SCREENING     Status: Normal   Collection Time   11/07/11  6:27 PM      Component Value Range Status Comment   MRSA by PCR NEGATIVE  NEGATIVE Final     Studies/Results: US Renal  11/10/2011  *RADIOLOGY REPORT*  Clinical Data: Acute superimposed upon chronic renal insufficiency.  RENAL/URINARY TRACT ULTRASOUND COMPLETE  Comparison:  Unenhanced CT abdomen and pelvis  11/07/2011, 06/10/2011.  Findings:  Right Kidney:  No hydronephrosis.  Severe diffuse cortical thinning.  Approximate 2.4 x 2.2 x 2.9 cm cyst arising from the lower pole and approximate 1.5 x 1.6 x 1.6 cm exophytic cyst arising from the mid kidney, as noted on the CT.  No solid parenchymal masses.  No shadowing calculi.  Approximate 12.5 cm in length.  Left Kidney:  No hydronephrosis.  Severe diffuse cortical thinning. Approximate to point, 1.9 x 2.2 cm cyst arising from the mid kidney, as noted on the CT.  No solid parenchymal masses.  No shadowing calculi.  Approximate 13.4 cm in length.  Bladder:  Decompressed by Foley catheter.  Other findings:  The left rectus sheath hematoma identified on recent CT was noted during imaging of the kidneys.  IMPRESSION:  1.  No evidence of obstruction involving either kidney. 2.  Severe diffuse cortical thinning bilaterally and bilateral renal cysts as noted on the recent CT.  No solid renal masses.  Original Report Authenticated By: Arnell Sieving, M.D.   Dg Chest Port 1 View  11/10/2011  *RADIOLOGY REPORT*  Clinical Data: Follow up respiratory failure and bibasilar atelectasis.  PORTABLE CHEST - 1 VIEW 11/10/2011 0620 hours:  Comparison: Portable chest x-ray yesterday.  Two-view chest x-ray 11/05/2011.  Findings: Prior sternotomy for CABG.  Cardiac silhouette moderately enlarged but stable.  Mild pulmonary venous hypertension without overt edema, improved.  Improved aeration in the lung bases, with mild atelectasis persisting.  Improved left pleural effusion, though a small to moderate sized effusion persists.  No new abnormalities.  Right subclavian dual lead transvenous pacemaker unchanged.  IMPRESSION: Improved aeration in the lung bases, with mild atelectasis persisting.  Improving left pleural effusion, though a small to moderate sized effusion persists.  Stable cardiomegaly without edema.  No new abnormalities.  Original Report Authenticated By: Arnell Sieving, M.D.   Dg Chest Port 1 View  11/09/2011  *RADIOLOGY REPORT*  Clinical Data: Respiratory failure.  PORTABLE CHEST - 1 VIEW  Comparison: Chest x-ray 11/05/2011.  Findings: Lung volumes are very low.  There are extensive bibasilar opacities, favored to predominately reflect subsegmental atelectasis, however, sequelae of aspiration or developing infection is difficult to entirely exclude.  Blunting of the left costophrenic sulcus is compatible with a small left-sided pleural effusion.  Severe pulmonary venous congestion, likely accentuated by patient's low lung volumes.  There could be some very mild interstitial pulmonary edema at this  time, however, this is not favored (interstitial prominence is favored to be related to low lung volumes and underpenetration of the film).  Heart size is mildly enlarged (unchanged). The patient is rotated to the left on today's exam, resulting in distortion of the mediastinal contours and reduced diagnostic sensitivity and specificity for mediastinal pathology.  Atherosclerotic calcifications within the arch of the aorta.  Status post median sternotomy for CABG.  Right-sided pacemaker device again noted, but film is too under penetrated to allow an accurate assessment of the leads.  IMPRESSION: 1.  Low volume chest with probable bibasilar subsegmental atelectasis and a small left-sided pleural effusion. 2.  Severe pulmonary venous congestion without frank pulmonary edema. 3.  Mild cardiomegaly is unchanged. 4.  Atherosclerosis. 5.  Postoperative changes and support apparatus, as above.  Original Report Authenticated By: Florencia Reasons, M.D.    Medications: Scheduled Meds:   . amiodarone  200 mg Oral Daily  . antiseptic oral rinse  15 mL Mouth Rinse BID  . docusate sodium  100 mg Oral Daily  . finasteride  5 mg Oral Daily  . furosemide  80 mg Intravenous Once  . glimepiride  1 mg Oral Q breakfast  . insulin aspart  0-20 Units Subcutaneous TID WC  . insulin  glargine  5 Units Subcutaneous Daily  . metoprolol  50 mg Oral BID  . pantoprazole  40 mg Oral Q1200  . piperacillin-tazobactam (ZOSYN)  IV  2.25 g Intravenous Q8H  . ursodiol  300 mg Oral BID  . DISCONTD: pantoprazole  40 mg Oral Q1200   Continuous Infusions:  PRN Meds:.sodium chloride, acetaminophen, benzonatate, bisacodyl, chlorpheniramine-HYDROcodone, fentaNYL, ondansetron  Assessment/ 78 y/o male with hx of CAD s/p CABG, paroxysmal Afib on coumadin, CKD  ( with baseline creatinine around 2 ) prostate ca scheduled for orchidectomy at [facility] ( which was cancelled as he developed PNA) and placed on lovenox for bridging after coumadin held was admitted to ICU with hemorrhagic shock secondary to large left rectus sheath hematoma. patient transfused  with 1 u PRBC and 4 u FFP , vitamin K and transferred to medical floor on tele.  Patient now in oliguric ATN for past 2 days and borderline BP requiring transfer to [place] for possible need for HD.      Plan:   acute hemorrhagic shock Secondary to large left Rectus sheath hematoma after  recently being on lovenox Both Lovenox and coumadin held Patient's H&H currently at 7.5 . Received PRBC and FFP in ICU. Will hold on transfusion for now and monitor serial H&h Patient has left periumbilical pain over hematoma site. Continue pain control. Recommend repeat CT if pain worsens, hemodynamically unstable  or drop in H&H   Acute blood loss anemia As outlined above  monitor serial H&H  transfuse if Hb <7    Oliguric ATN  patient's baseline creatinine ins around 2  he has been in oliguric renal failure for past 2 days with UOP less than 500cc. creatinine progressively worsening. Also noted to be hyponatremic  Discussed with Dr Arlean Hopping who recommends diuresing with lasix to see if renal function and volume overload improves  patient however has borderline HTN now and after discussion with renal plan to  transfer to [place] for close  monitoring an need for urgent HD   Hypotension Patient showing drop in BP today.  has some volume overload as well.  Unable to diurese at this time due to borderline BP.    Coagulopathy INR stable.  Monitor H&H and INR closely  CAD s/p CABG currently stable patient follows with Dr Patty Sermons as outpt   afib / aflutter Rate controlled at present No anticoagulation for now   Fluid overload Will attempt diurseing with lasix. Currently held as BP borderline and should be given once stable Seen by Dr Shirlee Latch today and plan on 2d echo  Hx of prostate ca Has chronic foley. Planned for outpt orchidectomy at [facility]   Full code  . i have spoken with PCCM Dr De Burrs who recommends transfer to stepdown at [facility] and will evaluate once patient arrives for need of ICU admission.  CONSULTS: [facility] ( renal)  [facility] ( [facility]) PCCM to be paged once patient arrives to the stepdown unit    LOS: 3 days   Banyan Goodchild 11/10/2011, 3:44 PM

## 2011-11-10 NOTE — Consult Note (Signed)
Jeffery Gibson 11/10/2011 Dravin Lance D Requesting Physician:  Dr. Gonzella Lex  Reason for Consult:  Acute on chronic renal failure HPI: The patient is a 78 y.o. year-old WM w hx of prostate Ca, CAF, DM, HTN and CKD presented 6/19 weak, dizzy and with c/o L abd pain. He was admitted, CT of abdomen showed large L rectus sheath hematoma.  He had been taking home lovenox as a bridge to coumadin. Hb was 7.4 ,down from 11 a few days prior.  BP was low, lowest was 60's on presentation. He was admitted and transfused.  He has developed progressive acute on chronic renal failure with low UOP 300-400 cc/d and renal svc was asked to see pt today.  Weight is up 10 kg from admit.  No SOB, transferred out of ICU yesterday.  He received 4-5 units PRBC's and some FFT on admission.  Creat is up to 5.13 today.  +CKD with baseline creat 2.1-2.4.   Pt reports L sided abd pain, no n/v. ROS  no HA, vision chg's  no sore throat  no sob, CP or cough  no diarrhea  no rash  no joint pains  no focal m wkness  no hallucination or jerking of ext  Date  Creat 2008  1.4-1.5 2009  1.8-1.9 2011  1.8-2.2 2012  1.9-2.26 May 2011 2.3-2.25 Jul 2011 2.22 Sep 2011 2.1-2.2 Admit 6/19 1.9 >>> 5.13 today  UA- no protein, 21-50wbc, 0-2rbc CT chest/abd- left rectus sheath hematoma, massive thyroid enlargement (old), gallstones, ASCVD prior CABG CXR- CM, no edema, LLL atx/effusion?, low volume  Past Medical History:  Past Medical History  Diagnosis Date  . Ischemic heart disease 05/06/06    post CARG 05/06/06  . Atrial flutter     afib and atrial flutter (permanent)  . Tachycardia-bradycardia 1997    dual-chamber/for tachybradycardia syndrome  . Obese     exogenous  . Renal insufficiency   . Anemia of chronic disease     aranesp injections  . Dyslipidemia   . Coronary artery disease   . Anemia associated with chronic renal failure 04/05/2011  . Cancer     prostate/on Lupron  . Adenocarcinoma of colon 11/2003    stage 2(T3,N0,M0)  . Diabetes mellitus   . Hypertension     Past Surgical History:  Past Surgical History  Procedure Date  . Laparotomy 12/04/2003    resection of rectosigmoid carcinoma/  . Radioactive seed implant 05/2005    transperianeal placement I-125 for prostate cancer/# of  seeds 55  . Cardiac catheterization 05/01/06    EF 40%/diffuse 3 vessel CAD/tight L antereior descending artery stenosis/diffuse disease proximal L anterior descending  . Coronary artery bypass graft 05/06/06    x4 with L internal mammary artery to the L anterior descending coronary artery  . Pacemaker removal 1997    Family History:  Family History  Problem Relation Age of Onset  . Heart failure Father 36  . Gallbladder disease Father   . COPD    . Coronary artery disease    . Emphysema     Social History:  reports that he quit smoking about 48 years ago. His smoking use included Cigarettes. He has never used smokeless tobacco. He reports that he does not drink alcohol. His drug history not on file.  Allergies:  Allergies  Allergen Reactions  . Bextra (Valdecoxib) Diarrhea  . Latex Hives  . Metformin And Related Nausea Only  . Mevacor (Lovastatin) Nausea And Vomiting  . Ramipril Cough  Home medications: Prior to Admission medications   Medication Sig Start Date End Date Taking? Authorizing Provider  AMARYL 1 MG tablet TAKE 1 TABLET DAILY. 06/30/11  Yes Cassell Clement, MD  amiodarone (PACERONE) 200 MG tablet TAKE (1/2) TABLET DAILY. 03/15/11  Yes Cassell Clement, MD  Ascorbic Acid (VITAMIN C PO) Take 1 tablet by mouth daily.     Yes Historical Provider, MD  aspirin 81 MG tablet Take 81 mg by mouth daily.     Yes Historical Provider, MD  cefdinir (OMNICEF) 300 MG capsule Take 300 mg by mouth 2 (two) times daily. 11/01/11 11/11/11 Yes Peyton Najjar, MD  chlorpheniramine-HYDROcodone (TUSSIONEX) 10-8 MG/5ML LQCR Take 5 mLs by mouth every 12 (twelve) hours as needed. Cough 10/26/11  Yes Cassell Clement, MD  Cholecalciferol (VITAMIN D) 2000 UNITS tablet Take 2,000 Units by mouth daily.     Yes Historical Provider, MD  Darbepoetin Alfa-Albumin (ARANESP IJ) Inject 1 Syringe as directed See admin instructions. Every 4 weeks as needed for anemia las given 09-2011   Yes Historical Provider, MD  enoxaparin (LOVENOX) 120 MG/0.8ML injection Inject 0.8 mLs (120 mg total) into the skin every 12 (twelve) hours. 11/06/11  Yes Cassell Clement, MD  FINASTERIDE PO Take 5 mg by mouth at bedtime.    Yes Historical Provider, MD  hydrochlorothiazide (HYDRODIURIL) 25 MG tablet  04/17/11  Yes Cassell Clement, MD  ibuprofen (ADVIL,MOTRIN) 600 MG tablet 1 daily with food as needed 09/25/10  Yes Cassell Clement, MD  ipratropium (ATROVENT HFA) 17 MCG/ACT inhaler Inhale 2 puffs into the lungs 3 (three) times daily. 11/01/11 10/31/12 Yes Peyton Najjar, MD  leuprolide (LUPRON) 22.5 MG injection Inject 22.5 mg into the muscle See admin instructions. Pt gets injection every 6 months for prostate cancer last given april11   Yes Historical Provider, MD  metoprolol (LOPRESSOR) 50 MG tablet Take 50 mg by mouth 2 (two) times daily.   Yes Historical Provider, MD  Multiple Vitamin (MULTIVITAMIN) tablet Take 1 tablet by mouth daily.     Yes Historical Provider, MD  PRILOSEC 20 MG capsule TAKE 1-2 CAPSULES DAILY. 09/27/11  Yes Cassell Clement, MD  rosuvastatin (CRESTOR) 10 MG tablet Take 5 mg by mouth See admin instructions. 1/2 tablet Monday Wednesday and Friday and pt takes nothing  On rest of days 10/22/11  Yes Cassell Clement, MD  Tamsulosin HCl (FLOMAX) 0.4 MG CAPS Take 0.4 mg by mouth daily after supper.  05/28/11  Yes Historical Provider, MD  telmisartan (MICARDIS) 80 MG tablet Take 80 mg by mouth daily.   Yes Historical Provider, MD  ursodiol (ACTIGALL) 300 MG capsule Take 300 mg by mouth 2 (two) times daily.    Yes Historical Provider, MD  warfarin (COUMADIN) 5 MG tablet Take 5 mg by mouth See admin instructions. Pt takes 1  tablet by mouth every day except Wednesday and Thursday pt takes none   Yes Historical Provider, MD  nitroGLYCERIN (NITROSTAT) 0.4 MG SL tablet Place 1 tablet (0.4 mg total) under the tongue every 5 (five) minutes as needed for chest pain. 10/02/10 10/02/11  Cassell Clement, MD    Inpatient medications:    . amiodarone  200 mg Oral Daily  . antiseptic oral rinse  15 mL Mouth Rinse BID  . docusate sodium  100 mg Oral Daily  . finasteride  5 mg Oral Daily  . glimepiride  1 mg Oral Q breakfast  . insulin aspart  0-20 Units Subcutaneous TID WC  . insulin glargine  5 Units Subcutaneous Daily  . metoprolol  50 mg Oral BID  . pantoprazole  40 mg Oral Q1200  . piperacillin-tazobactam (ZOSYN)  IV  2.25 g Intravenous Q8H  . ursodiol  300 mg Oral BID  . DISCONTD: pantoprazole  40 mg Oral Q1200    Labs: Basic Metabolic Panel:  Lab 11/10/11 9147 11/09/11 0530 11/08/11 1725 11/08/11 0615 11/07/11 0915  NA 125* 129* 129* 129* 128*  K 4.0 4.1 4.4 4.3 3.7  CL 91* 95* 95* 93* 92*  CO2 23 22 24 24 24   GLUCOSE 155* 167* 212* 253* 248*  BUN 59* 50* 43* 36* 26*  CREATININE 5.13* 4.66* 4.12* 3.26* 1.95*  ALB -- -- -- -- --  CALCIUM 8.1* 7.8* 7.9* 7.9* 8.4  PHOS -- -- -- 4.2 3.2   Liver Function Tests:  Lab 11/07/11 0915  AST 11  ALT 9  ALKPHOS 73  BILITOT 0.6  PROT 6.4  ALBUMIN 3.2*    Lab 11/07/11 0915  LIPASE 15  AMYLASE 30   No results found for this basename: AMMONIA:3 in the last 168 hours CBC:  Lab 11/10/11 1215 11/09/11 1900 11/09/11 1121 11/09/11 0530 11/07/11 0915  WBC 14.4* 15.8* 16.2* 18.7* --  NEUTROABS -- -- -- -- 12.9*  HGB 7.5* 7.6* 7.0* 7.3* --  HCT 21.7* 22.1* 20.6* 21.4* --  MCV 85.4 85.7 85.5 84.9 --  PLT 165 158 152 160 --   PT/INR: @labrcntip (inr:5) Cardiac Enzymes:  Lab 11/08/11 0625 11/07/11 1933 11/07/11 1340  CKTOTAL 170 101 93  CKMB 3.0 2.3 1.9  CKMBINDEX -- -- --  TROPONINI <0.30 <0.30 <0.30   CBG:  Lab 11/10/11 1150 11/10/11 0138 11/09/11  2128 11/09/11 1654 11/09/11 1128  GLUCAP 170* 166* 165* 165* 168*    Iron Studies: No results found for this basename: IRON:30,TIBC:30,TRANSFERRIN:30,FERRITIN:30 in the last 168 hours  Xrays/Other Studies: Dg Chest Port 1 View  11/10/2011  *RADIOLOGY REPORT*  Clinical Data: Follow up respiratory failure and bibasilar atelectasis.  PORTABLE CHEST - 1 VIEW 11/10/2011 0620 hours:  Comparison: Portable chest x-ray yesterday.  Two-view chest x-ray 11/05/2011.  Findings: Prior sternotomy for CABG.  Cardiac silhouette moderately enlarged but stable.  Mild pulmonary venous hypertension without overt edema, improved.  Improved aeration in the lung bases, with mild atelectasis persisting.  Improved left pleural effusion, though a small to moderate sized effusion persists.  No new abnormalities.  Right subclavian dual lead transvenous pacemaker unchanged.  IMPRESSION: Improved aeration in the lung bases, with mild atelectasis persisting.  Improving left pleural effusion, though a small to moderate sized effusion persists.  Stable cardiomegaly without edema.  No new abnormalities.  Original Report Authenticated By: Arnell Sieving, M.D.   Dg Chest Port 1 View  11/09/2011  *RADIOLOGY REPORT*  Clinical Data: Respiratory failure.  PORTABLE CHEST - 1 VIEW  Comparison: Chest x-ray 11/05/2011.  Findings: Lung volumes are very low.  There are extensive bibasilar opacities, favored to predominately reflect subsegmental atelectasis, however, sequelae of aspiration or developing infection is difficult to entirely exclude.  Blunting of the left costophrenic sulcus is compatible with a small left-sided pleural effusion.  Severe pulmonary venous congestion, likely accentuated by patient's low lung volumes.  There could be some very mild interstitial pulmonary edema at this time, however, this is not favored (interstitial prominence is favored to be related to low lung volumes and underpenetration of the film).  Heart size is  mildly enlarged (unchanged). The patient is rotated to the left on today's  exam, resulting in distortion of the mediastinal contours and reduced diagnostic sensitivity and specificity for mediastinal pathology.  Atherosclerotic calcifications within the arch of the aorta.  Status post median sternotomy for CABG.  Right-sided pacemaker device again noted, but film is too under penetrated to allow an accurate assessment of the leads.  IMPRESSION: 1.  Low volume chest with probable bibasilar subsegmental atelectasis and a small left-sided pleural effusion. 2.  Severe pulmonary venous congestion without frank pulmonary edema. 3.  Mild cardiomegaly is unchanged. 4.  Atherosclerosis. 5.  Postoperative changes and support apparatus, as above.  Original Report Authenticated By: Florencia Reasons, M.D.    Physical Exam:  Blood pressure 127/71, pulse 71, temperature 98.1 F (36.7 C), temperature source Oral, resp. rate 20, height 5\' 8"  (1.727 m), weight 125.5 kg (276 lb 10.8 oz), SpO2 97.00%.  Gen: alert, very obese WM, pale, looks ill but not in distress Skin: no rash, cyanosis, there is focal edema of L sided abd wall where hematoma is from RS bleed HEENT:  EOMI, sclera anicteric, throat clear Neck: + JVD, no bruits or LAN Chest: clear bilat Heart: regular, no rub or gallop, no murmur Abdomen: soft, very obese, large hematoma L lower and lateral abd wall, no HSM Ext: 1+ UE edema bilat, mild LE edema Neuro: alert, Ox3, no focal deficit, no asterixis Heme/Lymph: no bruising or LAN  Date  Creat 2008  1.4-1.5 2009  1.8-1.9 2011  1.8-2.2 2012  1.9-2.26 May 2011 2.3-2.25 Jul 2011 2.22 Sep 2011 2.1-2.2 Admit 6/19 1.9 >>> 5.13 today  UA- no protein, 21-50wbc, 0-2rbc Urine sediment- examined today, sheets of granular cast, also >20 wbc/hpf, no RBC's CT chest/abd- left rectus sheath hematoma, massive thyroid enlargement (old), gallstones, ASCVD prior CABG CXR- CM, no edema, LLL atx/effusion?, low  volume   Impression: 1. Acute on CKD III  - this is due to ATN from hemorrhagic shock/ARB and NSAID's on admission. Sediment shows sheets of granular casts. ARB/NSAID's stopped on admission. Does not need RRT now, but is heading in that direction if no improvement in the next few days. No uremic signs. Vol overloaded, up 10kg, no pulm edema.  Will give IV lasix one dose to see if that helps any. If needs RRT will have to be transferred to [facility].   2. CKD III - baseline creat 2.1-2.4, prob due to HTN, DM 3. Rectus sheath bleed/hematoma- s/p 4 units PRBC's 4. Chronic afib - per primary 5. DM - on oral agents at home 6. HTN- ARB (telmisartan) appropriately on hold, was also on metoprolol at home and is getting metoprolol here. BP is stable, 120's, hold BB if bp less than 110 for renal perfusion.  7. Prostate Ca- s/p brachytherapy in past 8. Hx colon Ca- 2005  Recommend - limit fluids, renal diet, give iv lasix 80 x 1 now, daily creat, watch closely for signs of uremia.  Decent change of recovery.  Discussed with patient and his family. Avoid nephrotoxins. Check UNa, urine eos, urine creat. Will follow.     Vinson Moselle  MD [employer] [phone] pgr    [phone] cell 11/10/2011, 1:36 PM

## 2011-11-10 NOTE — Progress Notes (Signed)
Report called to [facility]. [facility] requested; will call writer when truck is available.

## 2011-11-11 DIAGNOSIS — N170 Acute kidney failure with tubular necrosis: Secondary | ICD-10-CM

## 2011-11-11 DIAGNOSIS — R112 Nausea with vomiting, unspecified: Secondary | ICD-10-CM

## 2011-11-11 DIAGNOSIS — I959 Hypotension, unspecified: Secondary | ICD-10-CM

## 2011-11-11 DIAGNOSIS — M7981 Nontraumatic hematoma of soft tissue: Secondary | ICD-10-CM

## 2011-11-11 LAB — CBC
MCV: 85.1 fL (ref 78.0–100.0)
Platelets: 201 10*3/uL (ref 150–400)
RBC: 2.62 MIL/uL — ABNORMAL LOW (ref 4.22–5.81)
RDW: 15.9 % — ABNORMAL HIGH (ref 11.5–15.5)
WBC: 12 10*3/uL — ABNORMAL HIGH (ref 4.0–10.5)

## 2011-11-11 LAB — BASIC METABOLIC PANEL
CO2: 22 mEq/L (ref 19–32)
Chloride: 92 mEq/L — ABNORMAL LOW (ref 96–112)
Creatinine, Ser: 5.12 mg/dL — ABNORMAL HIGH (ref 0.50–1.35)
GFR calc Af Amer: 11 mL/min — ABNORMAL LOW (ref >90)
Sodium: 129 mEq/L — ABNORMAL LOW (ref 135–145)

## 2011-11-11 LAB — GLUCOSE, CAPILLARY
Glucose-Capillary: 71 mg/dL (ref 70–99)
Glucose-Capillary: 92 mg/dL (ref 70–99)
Glucose-Capillary: 90 mg/dL (ref 70–99)

## 2011-11-11 MED ORDER — DEXTROSE 5 % IV SOLN
500.0000 mg | INTRAVENOUS | Status: DC
  Administered 2011-11-11: 500 mg via INTRAVENOUS
  Filled 2011-11-11: qty 0.5

## 2011-11-11 MED ORDER — DEXTROSE 5 % IV SOLN
1.0000 g | INTRAVENOUS | Status: DC
  Administered 2011-11-12 – 2011-11-19 (×8): 1 g via INTRAVENOUS
  Filled 2011-11-11 (×8): qty 1

## 2011-11-11 MED ORDER — DEXTROSE 5 % IV SOLN
120.0000 mg | Freq: Once | INTRAVENOUS | Status: AC
  Administered 2011-11-11: 120 mg via INTRAVENOUS
  Filled 2011-11-11: qty 12

## 2011-11-11 MED ORDER — ATORVASTATIN CALCIUM 20 MG PO TABS
20.0000 mg | ORAL_TABLET | Freq: Every day | ORAL | Status: DC
  Administered 2011-11-11 – 2011-11-18 (×8): 20 mg via ORAL
  Filled 2011-11-11 (×9): qty 1

## 2011-11-11 NOTE — Progress Notes (Signed)
Notified MD of patient's short run of ventricular bigeminy and frequent PVC's, patient is asymptomatic, BP 113/64, currently rhythm is Atrial flutter, continuing to monitor patient.

## 2011-11-11 NOTE — Progress Notes (Signed)
[facility]  Interim history: 78 year old male w/ h/o prostate cancer and chronic AF, reports scheduled for procedure on prostate at [facility] which was cancelled because he had developed a pneumonia. He had his coumadin stopped and was bridging with lovenox. He had been rescheduled to have procedure, but was not getting better so he was started back on his coumadin 6/18. He is also apparently still getting lovenox. The am of 6/19, he was up to the bathroom when he felt weak and dizzy. Denies LOC, but stated he felt like he was going to pass out. His wife helped him back to bed and EMS was called. They report he was pale on arrival but BP and HR were normal. Denied any CP, SOB, vomiting, diarrhea, blood in stool or fever. Did report significant left sided abd pain. During evaluation was found to have some mild orthostasis, w/ staff noting his SBP drifting down w/ postural changes. He had a CT of abd/pelvis to eval pain. This showed a left rectus sheath hematoma.  He subsequentially developed ARF due to ATN from volume loss and was transferred from [facility] to [facility] on 6/22.    Consultants: Nephrology, Cardiology  Subjective: He feels some itching in her left lower back (noted to have a large area of eccymosis there. Abdominal distension is about the same. No other significant complaints but quite concerned that he may need dialysis.   Objective: Blood pressure 116/54, pulse 76, temperature 98.1 F (36.7 C), temperature source Oral, resp. rate 23, height 5\' 7"  (1.702 m), weight 124 kg (273 lb 5.9 oz), SpO2 100.00%. Weight change: -1.7 kg (-3 lb 12 oz)  Intake/Output Summary (Last 24 hours) at 11/11/11 1033 Last data filed at 11/11/11 1000  Gross per 24 hour  Intake    702 ml  Output   1100 ml  Net   -398 ml    Physical Exam: General appearance: alert, cooperative and no distress Throat: lips, mucosa, and tongue normal; teeth and gums normal Lungs: clear to auscultation  bilaterally Heart: regular rate and rhythm, S1, S2 normal, no murmur, click, rub or gallop Abdomen: soft, distended, quite firm in left half, BS normal Extremities: extremities normal, atraumatic, no cyanosis- diffuse mild edema in arms and legs Skin: Skin color, texture, turgor normal. No rashes or lesions or severe ecchymosis in left lower back, left abdomen and left forearm Neurologic: Grossly normal  Lab Results:  Basename 11/11/11 0830 11/10/11 0533  NA 129* 125*  K 4.1 4.0  CL 92* 91*  CO2 22 23  GLUCOSE 68* 155*  BUN 73* 59*  CREATININE 5.12* 5.13*  CALCIUM 8.2* 8.1*  MG -- --  PHOS -- --   No results found for this basename: AST:2,ALT:2,ALKPHOS:2,BILITOT:2,PROT:2,ALBUMIN:2 in the last 72 hours No results found for this basename: LIPASE:2,AMYLASE:2 in the last 72 hours  Basename 11/11/11 0830 11/10/11 1215  WBC 12.0* 14.4*  NEUTROABS -- --  HGB 7.7* 7.5*  HCT 22.3* 21.7*  MCV 85.1 85.4  PLT 201 165   No results found for this basename: CKTOTAL:3,CKMB:3,CKMBINDEX:3,TROPONINI:3 in the last 72 hours No components found with this basename: POCBNP:3 No results found for this basename: DDIMER:2 in the last 72 hours No results found for this basename: HGBA1C:2 in the last 72 hours No results found for this basename: CHOL:2,HDL:2,LDLCALC:2,TRIG:2,CHOLHDL:2,LDLDIRECT:2 in the last 72 hours No results found for this basename: TSH,T4TOTAL,FREET3,T3FREE,THYROIDAB in the last 72 hours No results found for this basename: VITAMINB12:2,FOLATE:2,FERRITIN:2,TIBC:2,IRON:2,RETICCTPCT:2 in the last 72 hours  Micro  Results: Recent Results (from the past 240 hour(s))  URINE CULTURE     Status: Normal   Collection Time   11/07/11  1:11 PM      Component Value Range Status Comment   Specimen Description URINE, CATHETERIZED   Final    Special Requests NONE   Final    Culture  Setup Time [id number]   Final    Colony Count >=100,000 COLONIES/ML   Final    Culture PSEUDOMONAS AERUGINOSA    Final    Report Status 11/10/2011 FINAL   Final    Organism ID, Bacteria PSEUDOMONAS AERUGINOSA   Final   MRSA PCR SCREENING     Status: Normal   Collection Time   11/07/11  6:27 PM      Component Value Range Status Comment   MRSA by PCR NEGATIVE  NEGATIVE Final   MRSA PCR SCREENING     Status: Normal   Collection Time   11/10/11  7:45 PM      Component Value Range Status Comment   MRSA by PCR NEGATIVE  NEGATIVE Final     Studies/Results: Ct Abdomen Pelvis Wo Contrast  11/07/2011  *RADIOLOGY REPORT*  Clinical Data: Lower abdominal pain and ecchymosis.  Concern for rectus sheath hematoma.  On Lovenox.  CT ABDOMEN AND PELVIS WITHOUT CONTRAST  Technique:  Multidetector CT imaging of the abdomen and pelvis was performed following the standard protocol without intravenous contrast.  Comparison: 06/12/2011, chest CT same date  Findings: Trace left pleural fluid or thickening is slightly increased since the prior exam.  Previously seen right lower lobe pulmonary nodule no longer identified. Presumed cardiac leads partly visualized.  Left anterolateral abdominal wall subcutaneous stranding is noted, asymmetrically more prominent than on the right, with foci of stranding and gas bilaterally indicative of injection site.  There is fusiform enlargement of the left rectus musculature with internal fluid fluid level and internal dependent hyperdensity measuring 14.0 x 13.4 x 7.8 cm.  Inferiorly, there is persistent internal fusiform enlargement of the left rectus musculature extending to the level of the symphysis pubis.  This area is not included in the measurement of the dominant fluid density presumed hematoma.  Gallstones noted without other CT evidence for acute cholecystitis. Low density renal lesions are identified, statistically most likely cysts but not further evaluated due to their small size.  These are stable.  Unenhanced liver, adrenal glands, spleen, and pancreas are normal.  No intra-abdominal or  retroperitoneal fluid collection. No free air.  No bowel wall thickening. Scattered colonic diverticuli noted without evidence for diverticulitis. Prostatic brachytherapy seeds are noted.  Bilateral small fat containing inguinal hernias.  No acute osseous abnormality.  IMPRESSION: Extensive fusiform enlargement of the left rectus abdominus muscle compatible with hemorrhage, with focal internal hematoma with fluid fluid level as measured above.  No acute intra-abdominal or pelvic pathology. These results were called by telephone on 11/07/2011  at  11:35 a.m. to  Dr. Bernette Mayers, who verbally acknowledged these results.  Original Report Authenticated By: Harrel Lemon, M.D.   Dg Chest 2 View  11/05/2011  *RADIOLOGY REPORT*  Clinical Data: Abnormal chest radiograph  CHEST - 2 VIEW  Comparison: 11/01/2011; 03/02/2008; 02/20/2007; chest CT - 06/12/2011  Findings: Grossly unchanged enlarged cardiac silhouette and mediastinal contours with mild tortuosity within the thoracic aorta.  Thoracic aorta calcifications.  Post median sternotomy CABG.  Stable positioning of support apparatus.  Persistent left infrahilar heterogeneous possible airspace opacities.  The previously identified 9 mm nodule within  the right lower lobe seen on prior chest CT is not well depicted on this examination.  The lungs are hyperinflated with flattening of bilateral hemidiaphragms.  There is chronic blunting of the right costophrenic angle without definite pleural effusion.  No pneumothorax.  Grossly unchanged bones.  IMPRESSION: Grossly unchanged left infrahilar heterogeneous air space opacities, worrisome for infection.  Given the stability of this finding as well as the known 1 cm nodule within the right lower lobe (not well depicted on this examination but seen on recent chest CT from 06/12/2011), further evaluation with chest CT is recommended.  These results will be called to the ordering clinician or representative by the Radiologist  Assistant, and communication documented in the PACS Dashboard.  Original Report Authenticated By: Waynard Reeds, M.D.     Dg Abd 1 View  11/08/2011  *RADIOLOGY REPORT*  Clinical Data: Abdominal pain.  Ileus.  ABDOMEN - 1 VIEW  Comparison: 11/07/2011.  Findings: Left abdominal wall hematoma is visualized on the supine radiographs.  There is no dilation of small bowel.  No evidence of obstruction.  No gross plain film evidence of free air.  Prostate brachytherapy seeds are present.  IMPRESSION: No evidence of obstruction or ileus.  Left abdominal wall hematoma.  Original Report Authenticated By: Andreas Newport, M.D.   Ct Chest Wo Contrast  11/07/2011  *RADIOLOGY REPORT*  Clinical Data: Weakness, cough and shortness of breath.  Evaluate for metastatic disease.  Recent diagnosis of pneumonia.  CT CHEST WITHOUT CONTRAST  Technique:  Multidetector CT imaging of the chest was performed following the standard protocol without IV contrast.  Comparison: CT of chest without contrast 06/12/2011.  Findings:  Mediastinum: The thyroid gland is again massively enlarged and heterogeneous in appearance, particularly the left lobe which has significant substernal extension along the high left paratracheal region.  This area is heterogeneous in attenuation and has multiple internal calcifications. This is similar to remote prior study from 04/09/2007, and presumably represents a large goiter.  No pathologically enlarged mediastinal or hilar lymph nodes. Heart size is mildly enlarged. There is no significant pericardial fluid, thickening or pericardial calcification. There is atherosclerosis of the thoracic aorta, the great vessels of the mediastinum and the coronary arteries, including calcified atherosclerotic plaque in the left main, left anterior descending, ramus intermedius, left circumflex and right coronary arteries. The patient is status post median sternotomy for CABG with a LIMA to the LAD.  A right-sided subclavian  pacemaker device is in place with lead tips terminating in the right atrial appendage and right ventricular apex.  The esophagus is unremarkable in appearance.  Lungs/Pleura: Previously noted 1 cm nodule in the right lower lobe has completely resolved (presumably infectious or inflammatory on the prior study).  There is a tiny amount of left-sided pleural fluid and/or thickening dependently, and there is a small amount of atelectasis in the inferior segment of the lingula and periphery of the left lower lobe.  Dependent atelectasis is also noted in the right lower lobe.  No acute consolidative airspace disease.  No definite suspicious appearing pulmonary nodules or masses are identified.  Upper Abdomen: Incidental imaging through the upper abdomen is remarkable for marked thickening of the left rectus abdominus musculature, and high attenuation fluid accumulating within this region, concerning for rectus sheath hematoma.  Numerous colonic diverticula are noted.  Partially calcified gallstones are seen layering dependently within the lumen of the gallbladder, but there are no signs to suggest acute cholecystitis at this time.  Musculoskeletal:  Median sternotomy wires. There are no aggressive appearing lytic or blastic lesions noted in the visualized portions of the skeleton.  IMPRESSION:  1.  No evidence of pneumonia at this time. 2. Interval development of thickening and high attenuation in the left rectus sheath, concerning for a rectus sheath hematoma in this patient with history of atrial fibrillation on anticoagulation therapy. 3.  Previously noted right lower lobe pulmonary nodule has resolved compared to the prior study, indicative that this was of infectious or inflammatory etiology on the prior study. 4.  Trace left-sided pleural fluid and/or thickening with minimal bibasilar subsegmental atelectasis. 5. Atherosclerosis, including extensive coronary artery disease. Please note that although the presence of  coronary artery calcium documents the presence of coronary artery disease, the severity of this disease and any potential stenosis cannot be assessed on this non-gated CT examination.   The patient is status post median sternotomy for CABG with a LIMA to the LAD. 6.  Cholelithiasis without findings to suggest acute cholecystitis. 7.  Colonic diverticulosis. 8.  Additional incidental findings, as detailed above.  Original Report Authenticated By: Florencia Reasons, M.D.   US Renal  11/10/2011  *RADIOLOGY REPORT*  Clinical Data: Acute superimposed upon chronic renal insufficiency.  RENAL/URINARY TRACT ULTRASOUND COMPLETE  Comparison:  Unenhanced CT abdomen and pelvis 11/07/2011, 06/10/2011.  Findings:  Right Kidney:  No hydronephrosis.  Severe diffuse cortical thinning.  Approximate 2.4 x 2.2 x 2.9 cm cyst arising from the lower pole and approximate 1.5 x 1.6 x 1.6 cm exophytic cyst arising from the mid kidney, as noted on the CT.  No solid parenchymal masses.  No shadowing calculi.  Approximate 12.5 cm in length.  Left Kidney:  No hydronephrosis.  Severe diffuse cortical thinning. Approximate to point, 1.9 x 2.2 cm cyst arising from the mid kidney, as noted on the CT.  No solid parenchymal masses.  No shadowing calculi.  Approximate 13.4 cm in length.  Bladder:  Decompressed by Foley catheter.  Other findings:  The left rectus sheath hematoma identified on recent CT was noted during imaging of the kidneys.  IMPRESSION:  1.  No evidence of obstruction involving either kidney. 2.  Severe diffuse cortical thinning bilaterally and bilateral renal cysts as noted on the recent CT.  No solid renal masses.  Original Report Authenticated By: Arnell Sieving, M.D.   Dg Chest Port 1 View  11/10/2011  *RADIOLOGY REPORT*  Clinical Data: Follow up respiratory failure and bibasilar atelectasis.  PORTABLE CHEST - 1 VIEW 11/10/2011 0620 hours:  Comparison: Portable chest x-ray yesterday.  Two-view chest x-ray 11/05/2011.   Findings: Prior sternotomy for CABG.  Cardiac silhouette moderately enlarged but stable.  Mild pulmonary venous hypertension without overt edema, improved.  Improved aeration in the lung bases, with mild atelectasis persisting.  Improved left pleural effusion, though a small to moderate sized effusion persists.  No new abnormalities.  Right subclavian dual lead transvenous pacemaker unchanged.  IMPRESSION: Improved aeration in the lung bases, with mild atelectasis persisting.  Improving left pleural effusion, though a small to moderate sized effusion persists.  Stable cardiomegaly without edema.  No new abnormalities.  Original Report Authenticated By: Arnell Sieving, M.D.   Dg Chest Port 1 View  11/09/2011  *RADIOLOGY REPORT*  Clinical Data: Respiratory failure.  PORTABLE CHEST - 1 VIEW  Comparison: Chest x-ray 11/05/2011.  Findings: Lung volumes are very low.  There are extensive bibasilar opacities, favored to predominately reflect subsegmental atelectasis, however, sequelae of aspiration or developing infection  is difficult to entirely exclude.  Blunting of the left costophrenic sulcus is compatible with a small left-sided pleural effusion.  Severe pulmonary venous congestion, likely accentuated by patient's low lung volumes.  There could be some very mild interstitial pulmonary edema at this time, however, this is not favored (interstitial prominence is favored to be related to low lung volumes and underpenetration of the film).  Heart size is mildly enlarged (unchanged). The patient is rotated to the left on today's exam, resulting in distortion of the mediastinal contours and reduced diagnostic sensitivity and specificity for mediastinal pathology.  Atherosclerotic calcifications within the arch of the aorta.  Status post median sternotomy for CABG.  Right-sided pacemaker device again noted, but film is too under penetrated to allow an accurate assessment of the leads.  IMPRESSION: 1.  Low volume chest  with probable bibasilar subsegmental atelectasis and a small left-sided pleural effusion. 2.  Severe pulmonary venous congestion without frank pulmonary edema. 3.  Mild cardiomegaly is unchanged. 4.  Atherosclerosis. 5.  Postoperative changes and support apparatus, as above.  Original Report Authenticated By: Florencia Reasons, M.D.    Medications: Scheduled Meds:   . amiodarone  200 mg Oral Daily  . antiseptic oral rinse  15 mL Mouth Rinse BID  . atorvastatin  20 mg Oral q1800  . docusate sodium  100 mg Oral Daily  . finasteride  5 mg Oral Daily  . furosemide  120 mg Intravenous Once  . furosemide  80 mg Intravenous Once  . glimepiride  1 mg Oral Q breakfast  . insulin aspart  0-20 Units Subcutaneous TID WC  . insulin glargine  5 Units Subcutaneous Daily  . metoprolol  50 mg Oral BID  . pantoprazole  40 mg Oral Q1200  . piperacillin-tazobactam (ZOSYN)  IV  2.25 g Intravenous Q8H  . ursodiol  300 mg Oral BID   Continuous Infusions:  PRN Meds:.sodium chloride, acetaminophen, benzonatate, bisacodyl, chlorpheniramine-HYDROcodone, fentaNYL, ondansetron  Assessment/Plan:  acute hemorrhagic shock  Secondary to large left Rectus sheath hematoma after recently being on lovenox  Both Lovenox and coumadin held  Patient's H&H currently at 7.7. Received PRBC and FFP in ICU. Will hold on transfusion for now and monitor serial H&h  Patient has left periumbilical pain over hematoma site. Continue pain control. Recommend repeat CT if pain worsens, hemodynamically unstable or drop in H&H   Acute blood loss anemia  As outlined above  monitor serial H&H  transfuse if Hb <7   Oliguric ATN  patient's baseline creatinine ins around 2  he has been in oliguric renal failure for past 2 days with UOP less than 500cc. creatinine progressively worsening. Also noted to be hyponatremic  Discussed with Dr Arlean Hopping who recommends diuresing with lasix to see if renal function and volume overload improves    patient however has borderline HTN now and after discussion with renal plan to transfer to [facility] for close monitoring an need for urgent HD   Hypotension  Patient showing drop in BP today.  has some volume overload as well.  Unable to diurese at this time due to borderline BP.   Coagulopathy  INR stable. Monitor H&H and INR closely   CAD s/p CABG  currently stable  patient follows with Dr Patty Sermons as outpt   afib / aflutter  Rate controlled at present  No anticoagulation for now   Fluid overload  Nephrology diurseing with lasix. Has urinated about a liter yesterday from a dose of Lasix.  Hx of prostate ca  Has chronic foley. Planned for outpt orchidectomy at [facility]    Full code  Family in room- spoke with his wife, son and daughter.   [facility] [phone] 11/11/2011, 10:33 AM  LOS: 4 days

## 2011-11-11 NOTE — Progress Notes (Addendum)
Patient ID: Jeffery Gibson, male   DOB: 05/02/1929, 78 y.o.   MRN: [id number]   [facility] Progress Note    Subjective:   Reports to be feeling better- except for "skin feeling tense/tender"- denies any SOB   Objective:   BP 116/54  Pulse 76  Temp 98.1 F (36.7 C) (Oral)  Resp 23  Ht 5\' 7"  (1.702 m)  Wt 124 kg (273 lb 5.9 oz)  BMI 42.82 kg/m2  SpO2 100%  Intake/Output Summary (Last 24 hours) at 11/11/11 0827 Last data filed at 11/11/11 0802  Gross per 24 hour  Intake    340 ml  Output   1100 ml  Net   -760 ml   Weight change: -1.7 kg (-3 lb 12 oz)  Physical Exam: AOZ:[id number] resting propped up in bed EXB:MWUXL RRR, normal S1 and S2  Resp:Decreased BS over bases, otherwise CTA KGM:WNUU, obese/distended- ecchymotic areas noted on left side Ext:1+ edema over LEs Neuro: No asterixis, vaguely oriented to place/time  Imaging: US Renal  11/10/2011  *RADIOLOGY REPORT*  Clinical Data: Acute superimposed upon chronic renal insufficiency.  RENAL/URINARY TRACT ULTRASOUND COMPLETE  Comparison:  Unenhanced CT abdomen and pelvis 11/07/2011, 06/10/2011.  Findings:  Right Kidney:  No hydronephrosis.  Severe diffuse cortical thinning.  Approximate 2.4 x 2.2 x 2.9 cm cyst arising from the lower pole and approximate 1.5 x 1.6 x 1.6 cm exophytic cyst arising from the mid kidney, as noted on the CT.  No solid parenchymal masses.  No shadowing calculi.  Approximate 12.5 cm in length.  Left Kidney:  No hydronephrosis.  Severe diffuse cortical thinning. Approximate to point, 1.9 x 2.2 cm cyst arising from the mid kidney, as noted on the CT.  No solid parenchymal masses.  No shadowing calculi.  Approximate 13.4 cm in length.  Bladder:  Decompressed by Foley catheter.  Other findings:  The left rectus sheath hematoma identified on recent CT was noted during imaging of the kidneys.  IMPRESSION:  1.  No evidence of obstruction involving either kidney. 2.  Severe diffuse cortical  thinning bilaterally and bilateral renal cysts as noted on the recent CT.  No solid renal masses.  Original Report Authenticated By: Arnell Sieving, M.D.   Dg Chest Port 1 View  11/10/2011  *RADIOLOGY REPORT*  Clinical Data: Follow up respiratory failure and bibasilar atelectasis.  PORTABLE CHEST - 1 VIEW 11/10/2011 0620 hours:  Comparison: Portable chest x-ray yesterday.  Two-view chest x-ray 11/05/2011.  Findings: Prior sternotomy for CABG.  Cardiac silhouette moderately enlarged but stable.  Mild pulmonary venous hypertension without overt edema, improved.  Improved aeration in the lung bases, with mild atelectasis persisting.  Improved left pleural effusion, though a small to moderate sized effusion persists.  No new abnormalities.  Right subclavian dual lead transvenous pacemaker unchanged.  IMPRESSION: Improved aeration in the lung bases, with mild atelectasis persisting.  Improving left pleural effusion, though a small to moderate sized effusion persists.  Stable cardiomegaly without edema.  No new abnormalities.  Original Report Authenticated By: Arnell Sieving, M.D.    Labs: BMET  Lab 11/10/11 0533 11/09/11 0530 11/08/11 1725 11/08/11 0615 11/07/11 0915  NA 125* 129* 129* 129* 128*  K 4.0 4.1 4.4 4.3 3.7  CL 91* 95* 95* 93* 92*  CO2 23 22 24 24 24   GLUCOSE 155* 167* 212* 253* 248*  BUN 59* 50* 43* 36* 26*  CREATININE 5.13* 4.66* 4.12* 3.26* 1.95*  ALB -- -- -- -- --  CALCIUM 8.1* 7.8* 7.9* 7.9* 8.4  PHOS -- -- -- 4.2 3.2   CBC  Lab 11/10/11 1215 11/09/11 1900 11/09/11 1121 11/09/11 0530 11/07/11 0915  WBC 14.4* 15.8* 16.2* 18.7* --  NEUTROABS -- -- -- -- 12.9*  HGB 7.5* 7.6* 7.0* 7.3* --  HCT 21.7* 22.1* 20.6* 21.4* --  MCV 85.4 85.7 85.5 84.9 --  PLT 165 158 152 160 --    Medications:      . amiodarone  200 mg Oral Daily  . antiseptic oral rinse  15 mL Mouth Rinse BID  . docusate sodium  100 mg Oral Daily  . finasteride  5 mg Oral Daily  . furosemide  80 mg  Intravenous Once  . glimepiride  1 mg Oral Q breakfast  . insulin aspart  0-20 Units Subcutaneous TID WC  . insulin glargine  5 Units Subcutaneous Daily  . metoprolol  50 mg Oral BID  . pantoprazole  40 mg Oral Q1200  . piperacillin-tazobactam (ZOSYN)  IV  2.25 g Intravenous Q8H  . ursodiol  300 mg Oral BID     Assessment/ Plan:   1. Acute on CKD III - this is due to ATN from hemorrhagic shock/ARB and NSAID's on admission. Sediment shows granular casts. ARB/NSAID's stopped on admission. No uremic signs and without acute RRT needs at this time. He is Vol overloaded, up 10kg with some response to furosemide yesterday, no pulm edema. Will retry IV lasix one dose and reassess response.  2. Rectus sheath bleed/hematoma- s/p 4 units PRBC's, Hgb stable in mid-7's 3. Chronic afib - per primary, off anticoagulation for now 4. DM - on oral agents at home 5. HTN- ARB (telmisartan) appropriately on hold, was also on metoprolol at home and is getting metoprolol here. BP is stable, 120's, hold BB if bp less than 110 for renal perfusion.  6. Prostate Ca- s/p brachytherapy in past 7. Hyponatremia: clinically hypervolemic, due to ARF- will fluid restrict  Zetta Bills, MD 11/11/2011, 8:27 AM

## 2011-11-11 NOTE — Progress Notes (Signed)
@   Subjective:  Denies dyspnea; chest pain with cough; left lower quadrant pain.   Objective:  Filed Vitals:   11/10/11 2324 11/11/11 0411 11/11/11 0442 11/11/11 0801  BP:  131/48  116/54  Pulse:  70  76  Temp: 97.6 F (36.4 C)  97 F (36.1 C) 98.1 F (36.7 C)  TempSrc: Oral  Oral Oral  Resp:  26  23  Height:      Weight:   124 kg (273 lb 5.9 oz)   SpO2:  99%  100%    Intake/Output from previous day:  Intake/Output Summary (Last 24 hours) at 11/11/11 0934 Last data filed at 11/11/11 0802  Gross per 24 hour  Intake    340 ml  Output   1100 ml  Net   -760 ml    Physical Exam: Physical exam: Well-developed obese in no acute distress.  Skin is warm and dry.  HEENT is normal.  Neck is supple.  Chest with diminished BS Cardiovascular exam is irregular Abdominal exam ecchymosis over LLQ and tenderness to palpation Extremities show no edema. neuro grossly intact    Lab Results: Basic Metabolic Panel:  Basename 11/10/11 0533 11/09/11 0530  NA 125* 129*  K 4.0 4.1  CL 91* 95*  CO2 23 22  GLUCOSE 155* 167*  BUN 59* 50*  CREATININE 5.13* 4.66*  CALCIUM 8.1* 7.8*  MG -- --  PHOS -- --   CBC:  Basename 11/10/11 1215 11/09/11 1900  WBC 14.4* 15.8*  NEUTROABS -- --  HGB 7.5* 7.6*  HCT 21.7* 22.1*  MCV 85.4 85.7  PLT 165 158     Assessment/Plan:  1) s/p rectus sheath bleed; anticoagulation reversed; need to follow Hgb (labs pending this AM) 2) Atrial fibrillation - Continue amiodarone and lopressor for rate control. 3) CAD s/p CABG - No ASA given bleed; resume statin. 4) Acute on chronic renal insufficiency - nephrology following and adjusting diuretics. 5) H/O prostate CA 6) Recent pneumonia  Olga Millers 11/11/2011, 9:34 AM

## 2011-11-11 NOTE — Progress Notes (Signed)
ANTIBIOTIC CONSULT NOTE - INITIAL  Pharmacy Consult for ceftazidime Indication: Pseudomonas UTI  Allergies  Allergen Reactions  . Bextra (Valdecoxib) Diarrhea  . Latex Hives  . Metformin And Related Nausea Only  . Mevacor (Lovastatin) Nausea And Vomiting  . Ramipril Cough    Patient Measurements: Height: 5\' 7"  (170.2 cm) Weight: 273 lb 5.9 oz (124 kg) IBW/kg (Calculated) : 66.1  Adjusted Body Weight:   Vital Signs: Temp: 98.1 F (36.7 C) (06/23 0801) Temp src: Oral (06/23 0801) BP: 116/54 mmHg (06/23 0801) Pulse Rate: 76  (06/23 0801) Intake/Output from previous day: 06/22 0701 - 06/23 0700 In: 340 [P.O.:240; IV Piggyback:100] Out: 900 [Urine:900] Intake/Output from this shift: Total I/O In: 362 [P.O.:300; IV Piggyback:62] Out: 200 [Urine:200]  Labs:  Basename 11/11/11 0830 11/10/11 1230 11/10/11 1215 11/10/11 0533 11/09/11 1900 11/09/11 0530  WBC 12.0* -- 14.4* -- 15.8* --  HGB 7.7* -- 7.5* -- 7.6* --  PLT 201 -- 165 -- 158 --  LABCREA -- 195.5 -- -- -- --  CREATININE 5.12* -- -- 5.13* -- 4.66*   Estimated Creatinine Clearance: 14.5 ml/min (by C-G formula based on Cr of 5.12). No results found for this basename: VANCOTROUGH:2,VANCOPEAK:2,VANCORANDOM:2,GENTTROUGH:2,GENTPEAK:2,GENTRANDOM:2,TOBRATROUGH:2,TOBRAPEAK:2,TOBRARND:2,AMIKACINPEAK:2,AMIKACINTROU:2,AMIKACIN:2, in the last 72 hours   Microbiology: Recent Results (from the past 720 hour(s))  URINE CULTURE     Status: Normal   Collection Time   11/07/11  1:11 PM      Component Value Range Status Comment   Specimen Description URINE, CATHETERIZED   Final    Special Requests NONE   Final    Culture  Setup Time [id number]   Final    Colony Count >=100,000 COLONIES/ML   Final    Culture PSEUDOMONAS AERUGINOSA   Final    Report Status 11/10/2011 FINAL   Final    Organism ID, Bacteria PSEUDOMONAS AERUGINOSA   Final   MRSA PCR SCREENING     Status: Normal   Collection Time   11/07/11  6:27 PM      Component  Value Range Status Comment   MRSA by PCR NEGATIVE  NEGATIVE Final   MRSA PCR SCREENING     Status: Normal   Collection Time   11/10/11  7:45 PM      Component Value Range Status Comment   MRSA by PCR NEGATIVE  NEGATIVE Final     Medical History: Past Medical History  Diagnosis Date  . Ischemic heart disease 05/06/06    post CARG 05/06/06  . Atrial flutter     afib and atrial flutter (permanent)  . Tachycardia-bradycardia 1997    dual-chamber/for tachybradycardia syndrome  . Obese     exogenous  . Renal insufficiency   . Anemia of chronic disease     aranesp injections  . Dyslipidemia   . Coronary artery disease   . Anemia associated with chronic renal failure 04/05/2011  . Cancer     prostate/on Lupron  . Adenocarcinoma of colon 11/2003    stage 2(T3,N0,M0)  . Diabetes mellitus   . Hypertension     Medications:  Zosyn 6/22->6/23 (received 3 doses)  Assessment: 82 yoM to be started on ceftazidime for pseudomonas UTI, also with noted recent PNA.  Pt afebrile, WBC trending down.  Received zosyn x 1 day.  Acute on CKD with CrCl ~15 ml/min.  SCr unchanged overnight.    Plan:  1.  Ceftazidime 500mg  IV q 24 hours.  2.  F/u renal fxn, labs, clinical course.   D'Arcy Abraha E 11/11/2011,11:33 AM

## 2011-11-12 DIAGNOSIS — R112 Nausea with vomiting, unspecified: Secondary | ICD-10-CM

## 2011-11-12 DIAGNOSIS — I059 Rheumatic mitral valve disease, unspecified: Secondary | ICD-10-CM

## 2011-11-12 DIAGNOSIS — M7981 Nontraumatic hematoma of soft tissue: Secondary | ICD-10-CM

## 2011-11-12 DIAGNOSIS — N170 Acute kidney failure with tubular necrosis: Secondary | ICD-10-CM

## 2011-11-12 DIAGNOSIS — I959 Hypotension, unspecified: Secondary | ICD-10-CM

## 2011-11-12 LAB — GLUCOSE, CAPILLARY
Glucose-Capillary: 239 mg/dL — ABNORMAL HIGH (ref 70–99)
Glucose-Capillary: 57 mg/dL — ABNORMAL LOW (ref 70–99)
Glucose-Capillary: 79 mg/dL (ref 70–99)
Glucose-Capillary: 59 mg/dL — ABNORMAL LOW (ref 70–99)

## 2011-11-12 LAB — CBC
Hemoglobin: 7.7 g/dL — ABNORMAL LOW (ref 13.0–17.0)
MCH: 29.1 pg (ref 26.0–34.0)
MCHC: 33.9 g/dL (ref 30.0–36.0)
MCV: 85.7 fL (ref 78.0–100.0)
RBC: 2.65 MIL/uL — ABNORMAL LOW (ref 4.22–5.81)

## 2011-11-12 LAB — RENAL FUNCTION PANEL
Albumin: 2.7 g/dL — ABNORMAL LOW (ref 3.5–5.2)
BUN: 80 mg/dL — ABNORMAL HIGH (ref 6–23)
Calcium: 8.4 mg/dL (ref 8.4–10.5)
GFR calc Af Amer: 11 mL/min — ABNORMAL LOW (ref >90)
Glucose, Bld: 60 mg/dL — ABNORMAL LOW (ref 70–99)
Phosphorus: 5.2 mg/dL — ABNORMAL HIGH (ref 2.3–4.6)
Sodium: 129 mEq/L — ABNORMAL LOW (ref 135–145)

## 2011-11-12 LAB — RETICULOCYTES
RBC.: 2.65 MIL/uL — ABNORMAL LOW (ref 4.22–5.81)
Retic Ct Pct: 3.6 % — ABNORMAL HIGH (ref 0.4–3.1)

## 2011-11-12 LAB — IRON AND TIBC: UIBC: 207 ug/dL (ref 125–400)

## 2011-11-12 MED ORDER — GLUCOSE-VITAMIN C 4-6 GM-MG PO CHEW
CHEWABLE_TABLET | ORAL | Status: AC
  Filled 2011-11-12: qty 1

## 2011-11-12 MED ORDER — GLUCOSE 4 G PO CHEW
1.0000 | CHEWABLE_TABLET | Freq: Once | ORAL | Status: AC
  Administered 2011-11-12: 4 g via ORAL
  Filled 2011-11-12: qty 1

## 2011-11-12 MED ORDER — FUROSEMIDE 80 MG PO TABS
160.0000 mg | ORAL_TABLET | Freq: Two times a day (BID) | ORAL | Status: DC
  Administered 2011-11-12 – 2011-11-15 (×8): 160 mg via ORAL
  Filled 2011-11-12 (×10): qty 2

## 2011-11-12 MED ORDER — GLUCOSE 40 % PO GEL
ORAL | Status: AC
  Administered 2011-11-12: 37.5 g
  Filled 2011-11-12: qty 1

## 2011-11-12 MED ORDER — GLUCOSE 40 % PO GEL
ORAL | Status: AC
  Filled 2011-11-12: qty 1

## 2011-11-12 MED ORDER — DEXTROSE 50 % IV SOLN
1.0000 | Freq: Once | INTRAVENOUS | Status: AC
  Administered 2011-11-12: 50 mL via INTRAVENOUS
  Filled 2011-11-12: qty 50

## 2011-11-12 MED ORDER — DEXTROSE 50 % IV SOLN
INTRAVENOUS | Status: AC
  Administered 2011-11-12: 50 mL
  Filled 2011-11-12: qty 50

## 2011-11-12 MED ORDER — FERUMOXYTOL INJECTION 510 MG/17 ML
510.0000 mg | INTRAVENOUS | Status: AC
  Administered 2011-11-12 – 2011-11-15 (×2): 510 mg via INTRAVENOUS
  Filled 2011-11-12 (×3): qty 17

## 2011-11-12 NOTE — Progress Notes (Signed)
  Echocardiogram 2D Echocardiogram has been performed.  Jeffery Gibson 11/12/2011, 10:11 AM 

## 2011-11-12 NOTE — Progress Notes (Signed)
[facility] PROGRESS NOTE  Jeffery Gibson ZOX:[id number] DOB: 09-Dec-1928 DOA: 11/07/2011 PCP: Cassell Clement, MD  Brief narrative: 78 year old male w/ hx of prostate cancer and chronic AF, scheduled for a procedure on prostate at [facility] which was cancelled because he had developed a pneumonia for which he was tx'd as an outpt. He had his coumadin stopped and was bridging with lovenox. He had been rescheduled to have procedure, but was not getting better so he was started back on his coumadin 6/18. He is also apparently still getting lovenox. The am of 6/19, he was up to the bathroom when he felt weak and dizzy. Denies LOC, but stated he felt like he was going to pass out. His wife helped him back to bed and EMS was called. They report he was pale on arrival but BP and HR were normal. Denied any CP, SOB, vomiting, diarrhea, blood in stool or fever. Did report significant left sided abd pain. During evaluation was found to have some mild orthostasis, w/ staff noting his SBP drifting down w/ postural changes. He had a CT of abd/pelvis to eval pain. This showed a left rectus sheath hematoma.   He subsequentially developed ARF due to ATN from volume loss and was transferred from [facility] to [facility] on 6/22.   Consultants:  [facility] cardiology  Nephrology  Admitted by PCCM and transferred to [facility] on 6/22  Procedures:  none  Antibiotics:  none  HPI/Subjective: States he is feeling better this am, but still with some sob and dry cough. Wife states pt is much more alert today. Low blood sugars this am (50's). Pt asymptomatic  Objective: Filed Vitals:   11/12/11 0149 11/12/11 0429 11/12/11 0759 11/12/11 1236  BP:  126/70 141/55 122/45  Pulse:  74 75 89  Temp:  97.5 F (36.4 C) 97.2 F (36.2 C) 98.7 F (37.1 C)  TempSrc:  Oral Oral Oral  Resp:  22 19 15   Height:      Weight: 124 kg (273 lb 5.9 oz)     SpO2:  98% 100% 98%    Intake/Output Summary (Last 24 hours) at 11/12/11  1458 Last data filed at 11/12/11 0900  Gross per 24 hour  Intake    360 ml  Output   1300 ml  Net   -940 ml    Exam:   General:  Awake, alert lying in bed in NAD  Cardiovascular: irregularly irregular   Respiratory: mild bibasilar crackles. No wheeze. No increased wob.   Abdomen: protuberant, mild tenderness on palpation without r/g. Ecchymosis seemingly improving. BS+, no appreciated masses or hsm  Neuro: no focal m/s deficits on exam  Extremities: 1+ edema bilateral upper and lower extremities  Psych: aox3, very pleasant mood and affect  Data Reviewed: Basic Metabolic Panel:  Lab 11/12/11 8119 11/11/11 0830 11/10/11 0533 11/09/11 0530 11/08/11 1725 11/08/11 0615 11/07/11 0915  NA 129* 129* 125* 129* 129* -- --  K 4.0 4.1 4.0 4.1 4.4 -- --  CL 92* 92* 91* 95* 95* -- --  CO2 23 22 23 22 24  -- --  GLUCOSE 60* 68* 155* 167* 212* -- --  BUN 80* 73* 59* 50* 43* -- --  CREATININE 5.32* 5.12* 5.13* 4.66* 4.12* -- --  CALCIUM 8.4 8.2* 8.1* 7.8* 7.9* -- --  MG -- -- -- -- -- 1.8 1.9  PHOS 5.2* -- -- -- -- 4.2 3.2   Liver Function Tests:  Lab 11/12/11 0410 11/07/11 0915  AST --  11  ALT -- 9  ALKPHOS -- 73  BILITOT -- 0.6  PROT -- 6.4  ALBUMIN 2.7* 3.2*    Lab 11/07/11 0915  LIPASE 15  AMYLASE 30   No results found for this basename: AMMONIA:5 in the last 168 hours CBC:  Lab 11/12/11 0410 11/11/11 0830 11/10/11 1215 11/09/11 1900 11/09/11 1121 11/07/11 0915  WBC 11.4* 12.0* 14.4* 15.8* 16.2* --  NEUTROABS -- -- -- -- -- 12.9*  HGB 7.7* 7.7* 7.5* 7.6* 7.0* --  HCT 22.7* 22.3* 21.7* 22.1* 20.6* --  MCV 85.7 85.1 85.4 85.7 85.5 --  PLT 245 201 165 158 152 --   Cardiac Enzymes:  Lab 11/08/11 0625 11/07/11 1933 11/07/11 1340  CKTOTAL 170 101 93  CKMB 3.0 2.3 1.9  CKMBINDEX -- -- --  TROPONINI <0.30 <0.30 <0.30   CBG:  Lab 11/12/11 1344 11/12/11 1239 11/12/11 0853 11/12/11 0752 11/11/11 2205  GLUCAP 79 57* 79 54* 90    Recent Results (from the past 240  hour(s))  URINE CULTURE     Status: Normal   Collection Time   11/07/11  1:11 PM      Component Value Range Status Comment   Specimen Description URINE, CATHETERIZED   Final    Special Requests NONE   Final    Culture  Setup Time [id number]   Final    Colony Count >=100,000 COLONIES/ML   Final    Culture PSEUDOMONAS AERUGINOSA   Final    Report Status 11/10/2011 FINAL   Final    Organism ID, Bacteria PSEUDOMONAS AERUGINOSA   Final   MRSA PCR SCREENING     Status: Normal   Collection Time   11/07/11  6:27 PM      Component Value Range Status Comment   MRSA by PCR NEGATIVE  NEGATIVE Final   MRSA PCR SCREENING     Status: Normal   Collection Time   11/10/11  7:45 PM      Component Value Range Status Comment   MRSA by PCR NEGATIVE  NEGATIVE Final      Assessment/Plan:  1. Acute hemorrhagic shock secondary to left rectus sheath hematoma: On Lovenox and coumadin prior to admit which are now on hold.  Hgb remains stable at 7.7 with improvement of abdominal pain. Pt did receive FFP and PRBCs in ICU. HD stable  2. ABL anemia secondary to above: Stable hgb at 7.7. Received 2 units PRBCs on 6/20 for hgb 5.9. Continue to monitor trend  3. Oliguric ATN/ARF in setting of CKD III  Secondary to hemorrhagic shock/ARB and NSAID's on admission. baseline creatinine is around 2  he has been in oliguric renal failure earlier in admit with UOP less than 500cc, but with good response to Lasix yesterday c urine output 1750cc. creatinine progressively worsening. Also noted to be hyponatremic  Continue IV diureses as ordered by nephrology. Monitor for emergent need for HD.   4. Volume overload: Responding well to IV diuresis. Still with some mild edema and crackles on exam with weight up ~9lbs from 6/20. Continue to monitor. 2Decho performed showing mild systolic dysfunction with EF 45-50%.  5. DM2 with hypoglycemic events: Likely d/t continued oral meds/lantus with decreased po intake while inpt +  ARF. Amaryl d/c'd but may take longer to get out of system given oliguria, Lantus d/c'd. Will discontinue carb modified diet for now. If persist, will need D5 infusion. q4 CBGs for now.   5. Afib: Continue amiodarone and lopressor for rate control. No  anticoagulation for now in setting of above.  6. Coagulopathy: S/p FFP on admit in setting of active bleed. INR/hgb stable.   7. Pseudomonas UTI: Current abx tx should cover adequately based on sensitivities  8. CAD s/p CABG  currently stable  patient follows with Dr Patty Sermons as outpt   9. Hx of prostate ca  Has chronic foley. Planned for outpt orchidectomy at [facility]   10. Prophylaxis: SCD's for dvt prophylaxis.   Code Status: full code Family Communication: findings and plan of care discussed with pt, wife and son at time of exam Disposition Plan: home when medically stable.   Cordelia Pen, NP-C [facility] Service [facility]  pgr [phone]   If 7PM-7AM, please contact night-coverage [email or web address] Password [email or web address] 11/12/2011, 2:58 PM   LOS: 5 days   I have personally examined this patient and reviewed the entire database. I have reviewed the above note, made any necessary editorial changes, and agree with its content.  Lonia Blood, MD [facility]

## 2011-11-12 NOTE — Progress Notes (Signed)
CBG:52  Treatment: 15 GM carbohydrate snack  Symptoms: None  Follow-up CBG: Time:0900 CBG Result79  Possible Reasons for Event: Inadequate meal intake  Comments/MD notified yes    Letrell Attwood L

## 2011-11-12 NOTE — Progress Notes (Signed)
[facility]  Subjective:  Awake, alert, wife at bedside.  Has some orthopnea   Objective: Vital signs in last 24 hours: Blood pressure 141/55, pulse 75, temperature 97.2 F (36.2 C), temperature source Oral, resp. rate 19, height 5\' 7"  (1.702 m), weight 124 kg (273 lb 5.9 oz), SpO2 100.00%.    PHYSICAL EXAM General--awake, alert Chest--crackles in bases Heart--no rub Abd--protuberant, ecchymoses fading Extr--1+ edema  Lab Results:   Lab 11/12/11 0410 11/11/11 0830 11/10/11 0533 11/08/11 0615 11/07/11 0915  NA 129* 129* 125* -- --  K 4.0 4.1 4.0 -- --  CL 92* 92* 91* -- --  CO2 23 22 23  -- --  BUN 80* 73* 59* -- --  CREATININE 5.32* 5.12* 5.13* -- --  ALB -- -- -- -- --  GLUCOSE 60* -- -- -- --  CALCIUM 8.4 8.2* 8.1* -- --  PHOS 5.2* -- -- 4.2 3.2     Basename 11/12/11 0410 11/11/11 0830  WBC 11.4* 12.0*  HGB 7.7* 7.7*  HCT 22.7* 22.3*  PLT 245 201   I/O YESTERDAY--532/1750,  I/O TODAY--240/350 Weight 116.4 kg on 19 June, 124 kg today   Assessment/Plan: 1. Acuterenal failure superimposed  on CKD III - this is due to ATN from hemorrhagic shock/ARB and NSAID's on admission. Sediment shows granular casts. ARB/NSAID's stopped on admission. No uremic signs and without acute RRT needs at this time. He is Vol overloaded, up 8 kg. IV lasix again today160 mg po x 2.  Cr starting to plateau (I think)  2. Rectus sheath bleed/hematoma- s/p 4 units PRBC's, Hgb stable in mid-7's.  Fe/TIBC 8% sat.  Will rx IV Feraheme 510 x 2 3. Chronic afib - per primary, off anticoagulation for now 4. DM - on oral agents at home.  Glucose 60 this AM 5. HTN- ARB (telmisartan) appropriately on hold, was also on metoprolol at home and is getting metoprolol here. BP is stable, 120's, hold BB if bp less than 110 for renal perfusion. BP 141/55 today 6. Prostate Ca- s/p brachytherapy in past.  Had been scheduled for orchiectomy which was reason coumadin was stopped and lovenox started  (for "bridging" prior to orchiectomy 7. Hyponatremia: clinically hypervolemic, due to ARF- will fluid restrict    LOS: 5 days   Juvenal Umar F 11/12/2011,11:18 AM   .labalb

## 2011-11-12 NOTE — Clinical Social Work Psychosocial (Signed)
     Clinical Social Work Department BRIEF PSYCHOSOCIAL ASSESSMENT 11/12/2011  Patient:  Jeffery Gibson, Jeffery Gibson     Account Number:  [id number]     Admit date:  11/07/2011  Clinical Social Worker:  Margaree Mackintosh  Date/Time:  11/12/2011 04:06 PM  Referred by:  CSW  Date Referred:  11/12/2011 Referred for  Psychosocial assessment   Other Referral:   Interview type:  Patient Other interview type:   with wife present.    PSYCHOSOCIAL DATA Living Status:  FAMILY Admitted from facility:   Level of care:   Primary support name:  [phone] Primary support relationship to patient:  SPOUSE Degree of support available:   Adequate.    CURRENT CONCERNS Current Concerns  Adjustment to Illness   Other Concerns:    SOCIAL WORK ASSESSMENT / PLAN Clinical Social Worker staffed case with RNCM and noted that pt was transferred from [facility].  CSW met with pt and wife at bedside.  CSW introduced self, explained role, provided support.  CSW provided opportunity for pt and wife to process difficult feelings.  Pt and wife shared that pt has had a lengthy health history.  CSW reviewed current coping skills and support system.  No other needs identified at this time.   Assessment/plan status:  No Further Intervention Required Other assessment/ plan:   Information/referral to community resources:   Conversations with MD/RN.    PATIENTS/FAMILYS RESPONSE TO PLAN OF CARE: Pt and wife were pleasant and appropriately engaged.  Pt and wife thanked CSW for intervention.

## 2011-11-13 DIAGNOSIS — I959 Hypotension, unspecified: Secondary | ICD-10-CM

## 2011-11-13 DIAGNOSIS — M7981 Nontraumatic hematoma of soft tissue: Secondary | ICD-10-CM

## 2011-11-13 DIAGNOSIS — N390 Urinary tract infection, site not specified: Secondary | ICD-10-CM | POA: Diagnosis present

## 2011-11-13 DIAGNOSIS — R112 Nausea with vomiting, unspecified: Secondary | ICD-10-CM

## 2011-11-13 DIAGNOSIS — N170 Acute kidney failure with tubular necrosis: Secondary | ICD-10-CM

## 2011-11-13 LAB — GLUCOSE, CAPILLARY
Glucose-Capillary: 180 mg/dL — ABNORMAL HIGH (ref 70–99)
Glucose-Capillary: 58 mg/dL — ABNORMAL LOW (ref 70–99)
Glucose-Capillary: 64 mg/dL — ABNORMAL LOW (ref 70–99)
Glucose-Capillary: 67 mg/dL — ABNORMAL LOW (ref 70–99)
Glucose-Capillary: 80 mg/dL (ref 70–99)
Glucose-Capillary: 99 mg/dL (ref 70–99)

## 2011-11-13 LAB — RENAL FUNCTION PANEL
Albumin: 2.6 g/dL — ABNORMAL LOW (ref 3.5–5.2)
Calcium: 8.4 mg/dL (ref 8.4–10.5)
Creatinine, Ser: 4.67 mg/dL — ABNORMAL HIGH (ref 0.50–1.35)
GFR calc non Af Amer: 11 mL/min — ABNORMAL LOW (ref >90)
Phosphorus: 5.3 mg/dL — ABNORMAL HIGH (ref 2.3–4.6)

## 2011-11-13 LAB — CBC
MCH: 29.4 pg (ref 26.0–34.0)
MCHC: 34.7 g/dL (ref 30.0–36.0)
Platelets: 222 10*3/uL (ref 150–400)

## 2011-11-13 MED ORDER — DARBEPOETIN ALFA-POLYSORBATE 150 MCG/0.3ML IJ SOLN
150.0000 ug | INTRAMUSCULAR | Status: DC
  Filled 2011-11-13: qty 0.3

## 2011-11-13 MED ORDER — DARBEPOETIN ALFA-POLYSORBATE 150 MCG/0.3ML IJ SOLN
150.0000 ug | INTRAMUSCULAR | Status: DC
  Administered 2011-11-13: 150 ug via SUBCUTANEOUS
  Filled 2011-11-13: qty 0.3

## 2011-11-13 NOTE — Progress Notes (Signed)
5 beat PVC run at 2230 and 10 beat run at 2310. Pt asymptomatic. Dr. Clearence Ped notified and aware.

## 2011-11-13 NOTE — Progress Notes (Signed)
Inpatient Diabetes Program Recommendations  AACE/ADA: New Consensus Statement on Inpatient Glycemic Control (2009)  Target Ranges:  Prepandial:   less than 140 mg/dL      Peak postprandial:   less than 180 mg/dL (1-2 hours)      Critically ill patients:  140 - 180 mg/dL   Reason for Visit: Resistant correction ordered   Inpatient Diabetes Program Recommendations Correction (SSI): when needed, please decrease to sensitive tidwc  Note: Thank you, Lenor Coffin, RN, CNS, Diabetes Coordinator [phone])

## 2011-11-13 NOTE — Progress Notes (Signed)
CBG: 52  Treatment: 15 GM carbohydrate snack, 1 glucose tab  Symptoms: None  Follow-up CBG: Time:2315 CBG Result:80  Possible Reasons for Event: Inadequate meal intake  Comments/MD notified: Dr. Wilmon Arms, Emelda Fear

## 2011-11-13 NOTE — Progress Notes (Addendum)
[facility]  Interim history: 78 year old male w/ h/o prostate cancer and chronic AF, reports scheduled for procedure on prostate at [facility] which was cancelled because he had developed a pneumonia. He had his coumadin stopped and was bridging with lovenox. He had been rescheduled to have procedure, but was not getting better so he was started back on his coumadin 6/18. He is also apparently still getting lovenox. The am of 6/19, he was up to the bathroom when he felt weak and dizzy. Denies LOC, but stated he felt like he was going to pass out. His wife helped him back to bed and EMS was called. They report he was pale on arrival but BP and HR were normal. Denied any CP, SOB, vomiting, diarrhea, blood in stool or fever. Did report significant left sided abd pain. During evaluation was found to have some mild orthostasis, w/ staff noting his SBP drifting down w/ postural changes. He had a CT of abd/pelvis to eval pain. This showed a left rectus sheath hematoma.  He subsequentially developed ARF due to ATN from volume loss and was transferred from [facility] to [facility] on 6/22.    Consultants: Nephrology, Cardiology  Subjective: He is sitting up in a chair asleep but easily awakened. His wife is in the room. He gives short answers and does not say much more than he feels "very well".   Objective: Blood pressure 127/58, pulse 71, temperature 97.2 F (36.2 C), temperature source Oral, resp. rate 18, height 5\' 7"  (1.702 m), weight 127.4 kg (280 lb 13.9 oz), SpO2 98.00%. Weight change: 3.4 kg (7 lb 7.9 oz)  Intake/Output Summary (Last 24 hours) at 11/13/11 1422 Last data filed at 11/13/11 1240  Gross per 24 hour  Intake    720 ml  Output   4225 ml  Net  -3505 ml    Physical Exam: General appearance: sleepy, cooperative when awakened, no distress Throat: lips, mucosa, and tongue normal; teeth and gums normal Lungs: clear to auscultation bilaterally Heart: regular rate and rhythm, S1, S2  normal, no murmur, click, rub or gallop Abdomen: soft, distended, quite firm in left half, BS normal Extremities: extremities normal, atraumatic, no cyanosis- diffuse mild edema in arms and legs Skin: Skin color, texture, turgor normal. No rashes or lesions or severe ecchymosis in left lower back, left abdomen and left forearm Neurologic: Grossly normal  Lab Results:  Basename 11/13/11 0400 11/12/11 0410  NA 127* 129*  K 3.7 4.0  CL 88* 92*  CO2 24 23  GLUCOSE 106* 60*  BUN 81* 80*  CREATININE 4.67* 5.32*  CALCIUM 8.4 8.4  MG -- --  PHOS 5.3* 5.2*    Basename 11/13/11 0400 11/12/11 0410  AST -- --  ALT -- --  ALKPHOS -- --  BILITOT -- --  PROT -- --  ALBUMIN 2.6* 2.7*   No results found for this basename: LIPASE:2,AMYLASE:2 in the last 72 hours  Basename 11/13/11 0357 11/12/11 0410  WBC 9.0 11.4*  NEUTROABS -- --  HGB 7.8* 7.7*  HCT 22.5* 22.7*  MCV 84.9 85.7  PLT 222 245   No results found for this basename: CKTOTAL:3,CKMB:3,CKMBINDEX:3,TROPONINI:3 in the last 72 hours No components found with this basename: POCBNP:3 No results found for this basename: DDIMER:2 in the last 72 hours No results found for this basename: HGBA1C:2 in the last 72 hours No results found for this basename: CHOL:2,HDL:2,LDLCALC:2,TRIG:2,CHOLHDL:2,LDLDIRECT:2 in the last 72 hours No results found for this basename: TSH,T4TOTAL,FREET3,T3FREE,THYROIDAB in the last  72 hours  Basename 11/12/11 0410  VITAMINB12 524  FOLATE 10.4  FERRITIN 267  TIBC 226  IRON 19*  RETICCTPCT 3.6*    Micro Results: Recent Results (from the past 240 hour(s))  URINE CULTURE     Status: Normal   Collection Time   11/07/11  1:11 PM      Component Value Range Status Comment   Specimen Description URINE, CATHETERIZED   Final    Special Requests NONE   Final    Culture  Setup Time [id number]   Final    Colony Count >=100,000 COLONIES/ML   Final    Culture PSEUDOMONAS AERUGINOSA   Final    Report Status  11/10/2011 FINAL   Final    Organism ID, Bacteria PSEUDOMONAS AERUGINOSA   Final   MRSA PCR SCREENING     Status: Normal   Collection Time   11/07/11  6:27 PM      Component Value Range Status Comment   MRSA by PCR NEGATIVE  NEGATIVE Final   MRSA PCR SCREENING     Status: Normal   Collection Time   11/10/11  7:45 PM      Component Value Range Status Comment   MRSA by PCR NEGATIVE  NEGATIVE Final     Studies/Results: reviewed   Medications: Scheduled Meds:    . amiodarone  200 mg Oral Daily  . antiseptic oral rinse  15 mL Mouth Rinse BID  . atorvastatin  20 mg Oral q1800  . cefTAZidime (FORTAZ)  IV  1 g Intravenous Q24H  . darbepoetin (ARANESP) injection - NON-DIALYSIS  150 mcg Subcutaneous Q Tue-1800  . dextrose  1 ampule Intravenous Once  . dextrose      . docusate sodium  100 mg Oral Daily  . ferumoxytol  510 mg Intravenous Q72H  . finasteride  5 mg Oral Daily  . furosemide  160 mg Oral BID  . glucose  1 tablet Oral Once  . glucose-Vitamin C      . metoprolol  50 mg Oral BID  . pantoprazole  40 mg Oral Q1200  . ursodiol  300 mg Oral BID  . DISCONTD: darbepoetin (ARANESP) injection - NON-DIALYSIS  150 mcg Subcutaneous Q Tue-HD  . DISCONTD: insulin aspart  0-20 Units Subcutaneous TID WC   Continuous Infusions:  PRN Meds:.sodium chloride, acetaminophen, benzonatate, bisacodyl, chlorpheniramine-HYDROcodone, fentaNYL, ondansetron  Assessment/Plan:  acute hemorrhagic shock  Secondary to large left Rectus sheath hematoma after recently being on lovenox  Both Lovenox and coumadin held  Patient's H&H currently at 7.7. Received PRBC and FFP in ICU.  Patient has left periumbilical pain over hematoma site.  Bleeding appears to have stopped  Acute blood loss anemia  As outlined above  transfuse if Hb <7   Oliguric ATN / Fluid overload patient's baseline creatinine is near 2  he has been in oliguric renal failure with fluid overload. He is responding well to high dose  diuretics being managed by Nephrology.   Hypotension  BP stable with SBP > 100   Hypoglycemia Due to ARF and poor PO intake. Holding insulin and PO hypoglycemics. Encouraging him to eat.   Pseudomonas UTI- complicated Pt usually self caths.  Will treat for 7 days  Currently on Ceftaz for ease of dosing (Q 24hr) was previously on Zosyn.   Coagulopathy  Corrected with FFP and Vit K  CAD s/p CABG  currently stable  patient follows with Dr Patty Sermons as outpt   afib / aflutter  Rate controlled  at present  No anticoagulation for now   Hx of prostate ca  [place] self caths BID.  S/p prostate seed implants.   Planned for outpt orchidectomy at [facility]    Full code  Family in room- spoke with his wife Dispo- follow in SDU for hypoglycemia  Mekaila Tarnow [phone] 11/13/2011, 2:22 PM  LOS: 6 days

## 2011-11-13 NOTE — Progress Notes (Addendum)
[facility]  Subjective:  Awake. Sitting in chair.  Wife at bedside   Objective: Vital signs in last 24 hours: Blood pressure 106/44, pulse 63, temperature 98.5 F (36.9 C), temperature source Oral, resp. rate 21, height 5\' 7"  (1.702 m), weight 127.4 kg (280 lb 13.9 oz), SpO2 96.00%.    PHYSICAL EXAM General--awake, alert, hard of hearing Chest--crackles in bases Heart--no rub Abd--ecchymoses fading Extr--trace edema  Lab Results:   Lab 11/13/11 0400 11/12/11 0410 11/11/11 0830 11/08/11 0615  NA 127* 129* 129* --  K 3.7 4.0 4.1 --  CL 88* 92* 92* --  CO2 24 23 22  --  BUN 81* 80* 73* --  CREATININE 4.67* 5.32* 5.12* --  ALB -- -- -- --  GLUCOSE 106* -- -- --  CALCIUM 8.4 8.4 8.2* --  PHOS 5.3* 5.2* -- 4.2    I/O yesterday--890/1900, today 120/1825  WEIGHT--124 KG YESTERDAY, 127.4 KG TODAY (?)  Assessment/Plan:  1. Acuterenal failure superimposed on CKD III - this is due to ATN from hemorrhagic shock/ARB and NSAID's on admission. Sediment shows granular casts. ARB/NSAID's stopped on admission. No uremic signs and without acute RRT needs at this time. He is still Vol overloaded.  I don't believe today's weight!  On po lasix 160 BID--will continue 1-2 more days 2. Rectus sheath bleed/hematoma- s/p 4 units PRBC's, Hgb stable in mid-7's. Fe/TIBC 8% sat. Hgb 7.8 today. IV Feraheme 510 x 2.  Will give 150 mcg aranesp today 3. Chronic afib - per primary, off anticoagulation for now 4. DM - on oral agents at home. Glucose 60 yesterday. Glucose better today 5. HTN- ARB (telmisartan) appropriately on hold, was also on metoprolol at home and is getting metoprolol here. BP is stable, 120's, hold BB if bp less than 110 for renal perfusion. BP 141/55 today 6. Prostate Ca- s/p brachytherapy in past. Had been scheduled for orchiectomy which was reason coumadin was stopped and lovenox started (for "bridging" prior to orchiectomy 7. Hyponatremia: clinically hypervolemic, due  to ARF- will fluid restrict 8. Pseudomonas UTI--on fortaz. May not clear completelywith foley cath in place  LOS: 6 days   Mily Malecki F 11/13/2011,10:24 AM   .labalb

## 2011-11-14 ENCOUNTER — Telehealth: Payer: Self-pay | Admitting: *Deleted

## 2011-11-14 DIAGNOSIS — M7981 Nontraumatic hematoma of soft tissue: Secondary | ICD-10-CM

## 2011-11-14 DIAGNOSIS — R112 Nausea with vomiting, unspecified: Secondary | ICD-10-CM

## 2011-11-14 DIAGNOSIS — N170 Acute kidney failure with tubular necrosis: Secondary | ICD-10-CM

## 2011-11-14 DIAGNOSIS — I959 Hypotension, unspecified: Secondary | ICD-10-CM

## 2011-11-14 LAB — RENAL FUNCTION PANEL
Albumin: 2.7 g/dL — ABNORMAL LOW (ref 3.5–5.2)
BUN: 84 mg/dL — ABNORMAL HIGH (ref 6–23)
Creatinine, Ser: 4.43 mg/dL — ABNORMAL HIGH (ref 0.50–1.35)
Glucose, Bld: 163 mg/dL — ABNORMAL HIGH (ref 70–99)
Phosphorus: 5.1 mg/dL — ABNORMAL HIGH (ref 2.3–4.6)
Sodium: 127 mEq/L — ABNORMAL LOW (ref 135–145)

## 2011-11-14 LAB — CBC
HCT: 23.7 % — ABNORMAL LOW (ref 39.0–52.0)
Hemoglobin: 8 g/dL — ABNORMAL LOW (ref 13.0–17.0)
MCH: 28.6 pg (ref 26.0–34.0)
MCV: 84.6 fL (ref 78.0–100.0)
RBC: 2.8 MIL/uL — ABNORMAL LOW (ref 4.22–5.81)

## 2011-11-14 LAB — MAGNESIUM: Magnesium: 1.8 mg/dL (ref 1.5–2.5)

## 2011-11-14 LAB — GLUCOSE, CAPILLARY: Glucose-Capillary: 183 mg/dL — ABNORMAL HIGH (ref 70–99)

## 2011-11-14 NOTE — Progress Notes (Signed)
[facility] PROGRESS NOTE  Jeffery Gibson WUJ:[id number] DOB: August 29, 1928 DOA: 11/07/2011 PCP: Cassell Clement, MD  Brief narrative: 78 year old male w/ hx of prostate cancer and chronic AF, scheduled for a procedure on prostate at [facility] which was cancelled because he had developed a pneumonia for which he was tx'd as an outpt. He had his coumadin stopped and was bridging with lovenox. He had been rescheduled to have procedure, but was not getting better so he was started back on his coumadin 6/18. He is also apparently still getting lovenox. The am of 6/19, he was up to the bathroom when he felt weak and dizzy. Denies LOC, but stated he felt like he was going to pass out. His wife helped him back to bed and EMS was called. They report he was pale on arrival but BP and HR were normal. Denied any CP, SOB, vomiting, diarrhea, blood in stool or fever. Did report significant left sided abd pain. During evaluation was found to have some mild orthostasis, w/ staff noting his SBP drifting down w/ postural changes. He had a CT of abd/pelvis to eval pain. This showed a left rectus sheath hematoma.   He subsequentially developed ARF due to ATN from volume loss and was transferred from [facility] to [facility] on 6/22.   Consultants:  [facility] cardiology  Nephrology  Admitted by PCCM and transferred to [facility] on 6/22  Procedures:  none  Antibiotics:  none  HPI/Subjective: Pt is doing well today.  He continues to have an intermittent dry cough.  He also reports modest pain in the region of his rectus sheath bleeding.  He denies sob, cp, n/v, or ha.  Objective: Filed Vitals:   11/13/11 2113 11/14/11 0039 11/14/11 0444 11/14/11 0729  BP: 143/59 116/56 125/53 122/50  Pulse: 74 75 67 61  Temp: 97.3 F (36.3 C) 98.1 F (36.7 C) 97.2 F (36.2 C) 97.5 F (36.4 C)  TempSrc: Oral Oral Oral Oral  Resp: 20 14 17 19   Height:      Weight:  120.5 kg (265 lb 10.5 oz)    SpO2: 97% 97% 96% 96%     Intake/Output Summary (Last 24 hours) at 11/14/11 1045 Last data filed at 11/14/11 0900  Gross per 24 hour  Intake    890 ml  Output   3375 ml  Net  -2485 ml    Exam:   General:  Awake, alert lying in bed in NAD  Cardiovascular: irregularly irregular - rate controlled  Respiratory: mild bibasilar crackles. No wheeze. No increased wob.   Abdomen: protuberant, mild tenderness on palpation without rebound. Ecchymosis improving. BS+, no appreciated masses or hsm  Neuro: no focal m/s deficits on exam  Extremities: 1+ edema bilateral lower extremities  Data Reviewed: Basic Metabolic Panel:  Lab 11/14/11 9562 11/13/11 0400 11/12/11 0410 11/11/11 0830 11/10/11 0533 11/08/11 0615  NA 127* 127* 129* 129* 125* --  K 4.0 3.7 4.0 4.1 4.0 --  CL 86* 88* 92* 92* 91* --  CO2 27 24 23 22 23  --  GLUCOSE 163* 106* 60* 68* 155* --  BUN 84* 81* 80* 73* 59* --  CREATININE 4.43* 4.67* 5.32* 5.12* 5.13* --  CALCIUM 8.6 8.4 8.4 8.2* 8.1* --  MG 1.8 -- -- -- -- 1.8  PHOS 5.1* 5.3* 5.2* -- -- 4.2   Liver Function Tests:  Lab 11/14/11 0405 11/13/11 0400 11/12/11 0410  AST -- -- --  ALT -- -- --  ALKPHOS -- -- --  BILITOT -- -- --  PROT -- -- --  ALBUMIN 2.7* 2.6* 2.7*   CBC:  Lab 11/14/11 0405 11/13/11 0357 11/12/11 0410 11/11/11 0830 11/10/11 1215  WBC 9.7 9.0 11.4* 12.0* 14.4*  NEUTROABS -- -- -- -- --  HGB 8.0* 7.8* 7.7* 7.7* 7.5*  HCT 23.7* 22.5* 22.7* 22.3* 21.7*  MCV 84.6 84.9 85.7 85.1 85.4  PLT 260 222 245 201 165   Cardiac Enzymes:  Lab 11/08/11 0625 11/07/11 1933 11/07/11 1340  CKTOTAL 170 101 93  CKMB 3.0 2.3 1.9  CKMBINDEX -- -- --  TROPONINI <0.30 <0.30 <0.30   CBG:  Lab 11/13/11 2115 11/13/11 1238 11/13/11 0801 11/13/11 0500 11/12/11 2354  GLUCAP 180* 157* 121* 99 80    Recent Results (from the past 240 hour(s))  URINE CULTURE     Status: Normal   Collection Time   11/07/11  1:11 PM      Component Value Range Status Comment   Specimen Description  URINE, CATHETERIZED   Final    Special Requests NONE   Final    Culture  Setup Time [id number]   Final    Colony Count >=100,000 COLONIES/ML   Final    Culture PSEUDOMONAS AERUGINOSA   Final    Report Status 11/10/2011 FINAL   Final    Organism ID, Bacteria PSEUDOMONAS AERUGINOSA   Final   MRSA PCR SCREENING     Status: Normal   Collection Time   11/07/11  6:27 PM      Component Value Range Status Comment   MRSA by PCR NEGATIVE  NEGATIVE Final   MRSA PCR SCREENING     Status: Normal   Collection Time   11/10/11  7:45 PM      Component Value Range Status Comment   MRSA by PCR NEGATIVE  NEGATIVE Final      Assessment/Plan:  Acute hemorrhagic shock secondary to left rectus sheath hematoma: On Lovenox and coumadin prior to admit which are now on hold - Hgb remains stable with improvement of abdominal pain - pt did receive FFP and PRBCs this admit  Hypotension Due to above - resolved  ABL anemia secondary to above: Stable hgb at present - received 4+ (last unit had to be stopped due to ? Reaction) units of PRBCs - continue to monitor trend - appears to have stopped bleeding at this time  Oliguric ATN/ARF in setting of CKD III  Secondary to hemorrhagic shock/ARB and NSAID's on admission - baseline creatinine is around 2 - had been oliguric earlier in admit with UOP less than 500cc, but with Lasix urine output has improved - creatinine now appears to have plateaud - Nephrology continues to follow  Volume overload: Responding well to IV diuresis - 2Decho performed showing mild systolic dysfunction with EF 45-50%  DM2 with hypoglycemic events: Likely d/t continued oral meds/lantus with decreased po intake while inpt + ARF - Amaryl d/c'd but may take longer to get out of system given oliguria, Lantus d/c'd - CBGs stabilizing now  Afib: Continue amiodarone and lopressor for rate control - no anticoagulation for now in setting of above  Coagulopathy: S/p FFP + vitamin K on admit in  setting of active bleed - INR stable as of 6/21 - recheck in AM   Pseudomonas UTI: Current abx tx should cover adequately based on sensitivities - can't remove foley at present as pt has obstructive issues   CAD s/p CABG  currently stable - follows with Dr Patty Sermons as outpt -  will discuss anticoag w/ Dr. Patty Sermons prior to d/c, but clearly unsafe to resume at this time  Hx of prostate ca  Requires chronic I/O cath at home - keep foley for now - planned for outpt [facility] at [facility]   Prophylaxis: SCD's for dvt prophylaxis.  Dispo Stable for transfer to medical bed  Code Status: full code Disposition Plan: home when medically stable  Lonia Blood, MD [facility] Office  [phone] Pager [phone]  On-Call/Text Page:      [email or web address]      password [facility]

## 2011-11-14 NOTE — Progress Notes (Signed)
ANTIBIOTIC CONSULT NOTE   Pharmacy Consult for ceftazidime Indication: Pseudomonas UTI  Patient Measurements: Height: 5\' 7"  (170.2 cm) Weight: 265 lb 10.5 oz (120.5 kg) IBW/kg (Calculated) : 66.1    Vital Signs: Temp: 97.5 F (36.4 C) (06/26 0729) Temp src: Oral (06/26 0729) BP: 122/50 mmHg (06/26 0729) Pulse Rate: 61  (06/26 0729) Intake/Output from previous day: 06/25 0701 - 06/26 0700 In: 530 [P.O.:480; IV Piggyback:50] Out: 4525 [Urine:4525] Intake/Output from this shift:    Labs:  Basename 11/14/11 0405 11/13/11 0400 11/13/11 0357 11/12/11 0410  WBC 9.7 -- 9.0 11.4*  HGB 8.0* -- 7.8* 7.7*  PLT 260 -- 222 245  LABCREA -- -- -- --  CREATININE 4.43* 4.67* -- 5.32*     Microbiology: Recent Results (from the past 720 hour(s))  URINE CULTURE     Status: Normal   Collection Time   11/07/11  1:11 PM      Component Value Range Status Comment   Specimen Description URINE, CATHETERIZED   Final    Special Requests NONE   Final    Culture  Setup Time [id number]   Final    Colony Count >=100,000 COLONIES/ML   Final    Culture PSEUDOMONAS AERUGINOSA   Final    Report Status 11/10/2011 FINAL   Final    Organism ID, Bacteria PSEUDOMONAS AERUGINOSA   Final   MRSA PCR SCREENING     Status: Normal   Collection Time   11/07/11  6:27 PM      Component Value Range Status Comment   MRSA by PCR NEGATIVE  NEGATIVE Final   MRSA PCR SCREENING     Status: Normal   Collection Time   11/10/11  7:45 PM      Component Value Range Status Comment   MRSA by PCR NEGATIVE  NEGATIVE Final     Medications:  Zosyn 6/22->6/23 (received 3 doses) Ceftaxidime 6/23-->  Assessment: 82 yoM  on ceftazidime for pseudomonas UTI, also with noted recent PNA.  Pt afebrile, WBC normalized.  Acute on CKD with CrCl ~16 ml/min.  Plan:  1.  Ceftazidime 1g IV q 24 hours.  2.  F/u renal fxn, labs, clinical course.   Mickeal Skinner 11/14/2011,8:45 AM

## 2011-11-14 NOTE — Telephone Encounter (Signed)
Per electronic pof to cancel the appts on 11/15/2011 due to the pt is in the hospital

## 2011-11-14 NOTE — Progress Notes (Signed)
[facility]  Subjective: Awake, alert, courteous, friendly   Objective: Vital signs in last 24 hours: Blood pressure 122/50, pulse 61, temperature 97.5 F (36.4 C), temperature source Oral, resp. rate 19, height 5\' 7"  (1.702 m), weight 120.5 kg (265 lb 10.5 oz), SpO2 96.00%.    PHYSICAL EXAM General--Lying in bed watching TV  Chest--crackles Heart--no rub Abd--ecchymoses fading Extr--trace edema  I/O yesterday 530/4525 I/O today     480/675 On furosemide 160 BID po  Weight  Date 22 Jun  123.8 kg 25 Jun  127.4 kg 26 Jun  120.5 kg  Lab Results:   Lab 11/14/11 0405 11/13/11 0400 11/12/11 0410  NA 127* 127* 129*  K 4.0 3.7 4.0  CL 86* 88* 92*  CO2 27 24 23   BUN 84* 81* 80*  CREATININE 4.43* 4.67* 5.32*  ALB -- -- --  GLUCOSE 163* -- --  CALCIUM 8.6 8.4 8.4  PHOS 5.1* 5.3* 5.2*     Basename 11/14/11 0405 11/13/11 0357  WBC 9.7 9.0  HGB 8.0* 7.8*  HCT 23.7* 22.5*  PLT 260 222   Assessment/Plan: 1. Acuterenal failure superimposed on CKD III - this is due to ATN from hemorrhagic shock/ARB and NSAID's on admission. Sediment shows granular casts. ARB/NSAID's stopped on admission. No uremic signs and without acute RRT needs at this time. He is still Vol overloaded. Weight decreasing with po lasix.  I'm hopeful he's in diuretic phase of ARF.  On po lasix 160 BID--will continue 1-2 more days 2. Rectus sheath bleed/hematoma- s/p 4 units PRBC's, Hgb 8 today. Fe/TIBC 8% sat. . IV Feraheme 510 x 2. On150 mcg aranesp wk (also IV Fe) 3. Chronic afib - per primary, off anticoagulation for now.  On amio and metoprolol 4. DM - on oral agents at home. Glucose 163 today  5. HTN- ARB (telmisartan) appropriately on hold, was also on metoprolol at home and is getting metoprolol here. BP is stable, 120's, hold BB if bp less than 110 for renal perfusion. BP 122/50 today 6. Prostate Ca- s/p brachytherapy in past. Had been scheduled for orchiectomy which was reason coumadin  was stopped and lovenox started (for "bridging" prior to orchiectomy) 7. Hyponatremia: clinically hypervolemic, due to ARF- will fluid restrict and continue lasix 8. Pseudomonas UTI--on fortaz. May not clear completely until foley cath is out     LOS: 7 days   Jeffery Gibson F 11/14/2011,10:36 AM   .labalb

## 2011-11-14 NOTE — Progress Notes (Signed)
Patient transferred from 2600, vss, no complaints of pain at this time. Madelin Rear RN, CMSRN

## 2011-11-14 NOTE — Telephone Encounter (Signed)
Pt's wife called lmovm, to please cancel pt's appt as he is in ICU at [facility]. Called Mrs. Chahal about pt's status, who advised "he had bleeding in stomach, kidney's started failing so they sent him to ICU -[facility] for temporary/possible dialysis, has been given lasix was able to urinate, a little bit.  The doctors seem to think his kidneys are better but he's not out of the woods. He just feels bad his sugar has been in the 40's/50's he is very weak. They said he cant go home until he eats better. This all started after the Lupron. They gave him the aranesp while he's been in the hospital "  Discussed with pt I will pass information onto Dr. Welton Flakes and if she or pt has any concerns to please call us.  Onc Tx sent for appt 6/27 to be cancelled.

## 2011-11-15 ENCOUNTER — Ambulatory Visit: Payer: Medicare Other

## 2011-11-15 ENCOUNTER — Other Ambulatory Visit: Payer: Medicare Other | Admitting: Lab

## 2011-11-15 DIAGNOSIS — N170 Acute kidney failure with tubular necrosis: Secondary | ICD-10-CM

## 2011-11-15 DIAGNOSIS — M7981 Nontraumatic hematoma of soft tissue: Secondary | ICD-10-CM

## 2011-11-15 DIAGNOSIS — R112 Nausea with vomiting, unspecified: Secondary | ICD-10-CM

## 2011-11-15 DIAGNOSIS — I959 Hypotension, unspecified: Secondary | ICD-10-CM

## 2011-11-15 DIAGNOSIS — N179 Acute kidney failure, unspecified: Secondary | ICD-10-CM | POA: Diagnosis present

## 2011-11-15 LAB — GLUCOSE, CAPILLARY
Glucose-Capillary: 144 mg/dL — ABNORMAL HIGH (ref 70–99)
Glucose-Capillary: 216 mg/dL — ABNORMAL HIGH (ref 70–99)

## 2011-11-15 LAB — CBC
Hemoglobin: 8.3 g/dL — ABNORMAL LOW (ref 13.0–17.0)
MCH: 29.1 pg (ref 26.0–34.0)
MCHC: 34.3 g/dL (ref 30.0–36.0)
Platelets: 271 10*3/uL (ref 150–400)

## 2011-11-15 LAB — RENAL FUNCTION PANEL
Albumin: 2.8 g/dL — ABNORMAL LOW (ref 3.5–5.2)
CO2: 29 mEq/L (ref 19–32)
Calcium: 8.9 mg/dL (ref 8.4–10.5)
Chloride: 86 mEq/L — ABNORMAL LOW (ref 96–112)
Creatinine, Ser: 4.25 mg/dL — ABNORMAL HIGH (ref 0.50–1.35)
GFR calc Af Amer: 14 mL/min — ABNORMAL LOW (ref >90)
GFR calc non Af Amer: 12 mL/min — ABNORMAL LOW (ref >90)
Sodium: 129 mEq/L — ABNORMAL LOW (ref 135–145)

## 2011-11-15 LAB — PROTIME-INR
INR: 1.31 (ref 0.00–1.49)
Prothrombin Time: 16.5 seconds — ABNORMAL HIGH (ref 11.6–15.2)

## 2011-11-15 MED ORDER — INSULIN GLARGINE 100 UNIT/ML ~~LOC~~ SOLN
15.0000 [IU] | Freq: Every day | SUBCUTANEOUS | Status: DC
  Administered 2011-11-15 – 2011-11-18 (×4): 15 [IU] via SUBCUTANEOUS

## 2011-11-15 MED ORDER — SACCHAROMYCES BOULARDII 250 MG PO CAPS
250.0000 mg | ORAL_CAPSULE | Freq: Two times a day (BID) | ORAL | Status: DC
  Administered 2011-11-15 – 2011-11-19 (×9): 250 mg via ORAL
  Filled 2011-11-15 (×10): qty 1

## 2011-11-15 NOTE — Care Management Note (Addendum)
    Page 1 of 2   11/19/2011     4:33:09 PM   CARE MANAGEMENT NOTE 11/19/2011  Patient:  Jeffery Gibson, Jeffery Gibson   Account Number:  [id number]  Date Initiated:  11/08/2011  Documentation initiated by:  DAVIS,RHONDA  Subjective/Objective Assessment:   pt with anemia and recent history of pna and abd pain, hgb down to 5.9 from 7.7 after transfusion.     Action/Plan:   lives at home with spouse   Anticipated DC Date:  11/19/2011   Anticipated DC Plan:  HOME W HOME HEALTH SERVICES      DC Planning Services  CM consult      [facility] Choice  HOME HEALTH   Choice offered to / List presented to:  C-3 Spouse   DME arranged  Levan Hurst      DME agency  [facility]     HH arranged  HH-1 RN  HH-2 PT      [facility] agency  [facility]   Status of service:  Completed, signed off Medicare Important Message given?  YES (If response is "NO", the following Medicare IM given date fields will be blank) Date Medicare IM given:  11/07/2011 Date Additional Medicare IM given:    Discharge Disposition:    Per UR Regulation:  Reviewed for med. necessity/level of care/duration of stay  If discussed at Long Length of Stay Meetings, dates discussed:   11/14/2011    Comments:  PCP Dr. Cassell Clement- apt scheduled for 7/3 at 11 am  11/19/11 16:31 Letha Cape RN, BSN [phone] per floor RN, stated patient has just been started on insulin and she is giving patient education on insulin, he has never been on insulin.  I informed Hilda Lias with [facility] to also have HHRN to follow up with insulin education at home, she states she will let them know about this as well.  11/19/11 10:12 Letha Cape RN, BSN [phone] patient for dc today with [facility] for blood draw for bmet and orthostatics on Tues and Thursday, also has hhpt with [facility], Marie notified of dc today.  Justin notified about rolling walker.  11/14/11 13:53 Letha Cape RN, BSN [phone] patient is for dc possbily on Friday or  Saturday.  Spouse chose [facility] from agency list for [facility], hhpt and rolling walker, also notified Crystal with [facility].  Referral sent to [facility] , Marie notified.  Soc will begin 24-48 hrs post discharge.  NCM will continue to follow for dc needs.  11/13/11 1530 Henrietta Mayo RN MSN CCM Pt has cane, son has also purchased a walker per spouse. Mobility decreased 2/2 weakness - discussed home health PT but pt declines, feels he can regain strength without HH services.  [phone] Marcelle Smiling, RN, BSN, CCM No discharge needs present at time of this review Case Management [phone]

## 2011-11-15 NOTE — Progress Notes (Signed)
[facility]  Subjective:  Awake, alert, wife at bedside.  Says foley to come out today   Objective: Vital signs in last 24 hours: Blood pressure 127/72, pulse 89, temperature 97.6 F (36.4 C), temperature source Oral, resp. rate 20, height 5\' 7"  (1.702 m), weight 120.4 kg (265 lb 6.9 oz), SpO2 97.00%.    PHYSICAL EXAM General--pale, alert, friendly Chest--clear Heart--no rub Abd--ecchymoses fading, still tender Extr--tr edema   Weight  Date  22 Jun  123.8 kg  25 Jun  127.4 kg  26 Jun  120.5 kg 27 Jun  120.4  I/O yesterday  650/2300    Lab Results:   Lab 11/15/11 0625 11/14/11 0405 11/13/11 0400  NA 129* 127* 127*  K 3.4* 4.0 3.7  CL 86* 86* 88*  CO2 29 27 24   BUN 85* 84* 81*  CREATININE 4.25* 4.43* 4.67*  ALB -- -- --  GLUCOSE 152* -- --  CALCIUM 8.9 8.6 8.4  PHOS 4.5 5.1* 5.3*     Basename 11/15/11 0625 11/14/11 0405  WBC 10.0 9.7  HGB 8.3* 8.0*  HCT 24.2* 23.7*  PLT 271 260    Assessment/Plan: 1. Acuterenal failure superimposed on CKD III - this is due to ATN from hemorrhagic shock/ARB and NSAID's on admission. Sediment shows granular casts. ARB/NSAID's stopped on admission. No uremic signs and without acute RRT needs at this time. He is still Vol overloaded. Weight decreasing with po lasix. I'm hopeful he's in diuretic phase of ARF. On po lasix 160 BID--will continue 1-2 more days.  Baseline Cr 2 2. Rectus sheath bleed/hematoma- s/p 4 units PRBC's, Hgb 8.3 today. Fe/TIBC 8% sat. . IV Feraheme (IN Fe)  510 x 2. On150 mcg aranesp wk  3. Chronic afib - per primary, off anticoagulation for now. On amio and metoprolol 4. DM - on oral agents at home. Glucose 163 today  5. HTN- ARB (telmisartan) appropriately on hold, was also on metoprolol at home and is getting metoprolol here. BP is stable, 120's, hold BB if bp less than 110 for renal perfusion. BP 127/72 today 6. Prostate Ca- s/p brachytherapy in past. Had been scheduled for orchiectomy  which was reason coumadin was stopped and lovenox started (for "bridging" prior to orchiectomy) 7. Hyponatremia: clinically hypervolemic, due to ARF- will fluid restrict and continue lasix 8. Pseudomonas UTI--on fortaz.  foley cath is sched to come out today   LOS: 8 days   LOS: 8 days   Halleigh Comes F 11/15/2011,12:39 PM   .labalb

## 2011-11-15 NOTE — Progress Notes (Signed)
[facility] PROGRESS NOTE  UNNAMED ZEIEN WUJ:[id number] DOB: 1929/04/06 DOA: 11/07/2011   Assessment/Plan: Patient Active Hospital Problem List: Rectus sheath hematoma (11/07/2011)/Acute hemorrhagic shock secondary to left rectus sheath hematoma/Acute blood loss anemia (11/07/2011) -On Lovenox and coumadin prior to admit which are now on hold, pt receive FFP and Received 2 units PRBCs  this admit. - Hgb remains stable no abdominal pain, cbc in am  Coagulopathy (11/07/2011) continue to hold anticoagulation. Resumes as an outpatient.  Paroxysmal a-fib (11/09/2011) Continue amiodarone and lopressor for rate control. No anticoagulation see above.  Diabetes mellitus/hypoglycemic events:  Likely d/t continued oral meds/lantus with decreased po intake while inpt + ARF. Amaryl d/c'd but may take longer to get out of system given oliguria, Lantus d/c'd. Will discontinue carb modified diet for now.  Volume overload:  Responding well to IV diuresis. lung are clear.  Oliguric ATN/ARF in setting of CKD III  Secondary to hemorrhagic shock/ARB and NSAID's on admission,  baseline creatinine is around 2  -had been oliguric earlier in admit with UOP less than 500cc, but with Lasix urine output has improved, creatinine now appears to have plateau. -Per Nephrology   Pseudomonas UTI:  -Current abx tx should cover adequately based on sensitivities - can't remove foley at present as pt has obstructive issues  -will neede a PICC to complete antibitoics.  Hx of prostate ca  Requires chronic I/O cath at home, d'c foley in and out cath.   Prophylaxis:  SCD's for dvt prophylaxis  Consultants:  [facility] cardiology  Nephrology  Admitted by PCCM and transferred to [facility] on 6/22  Lambert Keto, MD  [facility] Pager [phone]  If 7PM-7AM, please contact night-coverage [email or web address] Password [email or web address] 11/15/2011, 7:41 AM   LOS: 8 days    Antibiotics:  fortaz  11/12/2011    Interim History: 78 year old male w/ hx of prostate cancer and chronic AF, scheduled for a procedure on prostate at [facility] which was cancelled because he had developed a pneumonia for which he was tx'd as an outpt. He had his coumadin stopped and was bridging with lovenox. He had been rescheduled to have procedure, but was not getting better so he was started back on his coumadin 6/18. He is also apparently still getting lovenox. The am of 6/19, he was up to the bathroom when he felt weak and dizzy. Denies LOC, but stated he felt like he was going to pass out. His wife helped him back to bed and EMS was called. They report he was pale on arrival but BP and HR were normal. Denied any CP, SOB, vomiting, diarrhea, blood in stool or fever. Did report significant left sided abd pain. During evaluation was found to have some mild orthostasis, w/ staff noting his SBP drifting down w/ postural changes. He had a CT of abd/pelvis to eval pain. This showed a left rectus sheath hematoma. He subsequentially developed ARF due to ATN from volume loss and was transferred from [facility] to [facility] on 6/22.    Subjective: No complains feels better.   Objective: Filed Vitals:   11/14/11 1206 11/14/11 1522 11/14/11 2051 11/15/11 0608  BP: 124/55 112/72 138/71 120/68  Pulse: 62 68 62 71  Temp: 98.2 F (36.8 C) 98 F (36.7 C) 97.5 F (36.4 C) 97.6 F (36.4 C)  TempSrc: Oral Oral Oral Oral  Resp: 17 17 18 20   Height:      Weight:    120.4 kg (265 lb 6.9 oz)  SpO2: 93%  95% 98% 93%    Intake/Output Summary (Last 24 hours) at 11/15/11 0741 Last data filed at 11/15/11 0335  Gross per 24 hour  Intake    650 ml  Output   2300 ml  Net  -1650 ml   Weight change: -0.1 kg (-3.5 oz)  Exam:  General: Alert, awake, oriented x3, in no acute distress.  HEENT: No bruits, no goiter.  Heart: Regular rate and rhythm, without murmurs, rubs, gallops.  Lungs:good air movement CTA b/l Abdomen: Soft,  nontender, nondistended, positive bowel sounds.  Neuro: Grossly intact, nonfocal.   Data Reviewed: Basic Metabolic Panel:  Lab 11/14/11 4098 11/13/11 0400 11/12/11 0410 11/11/11 0830 11/10/11 0533  NA 127* 127* 129* 129* 125*  K 4.0 3.7 -- -- --  CL 86* 88* 92* 92* 91*  CO2 27 24 23 22 23   GLUCOSE 163* 106* 60* 68* 155*  BUN 84* 81* 80* 73* 59*  CREATININE 4.43* 4.67* 5.32* 5.12* 5.13*  CALCIUM 8.6 8.4 8.4 8.2* 8.1*  MG 1.8 -- -- -- --  PHOS 5.1* 5.3* 5.2* -- --   Liver Function Tests:  Lab 11/14/11 0405 11/13/11 0400 11/12/11 0410  AST -- -- --  ALT -- -- --  ALKPHOS -- -- --  BILITOT -- -- --  PROT -- -- --  ALBUMIN 2.7* 2.6* 2.7*   No results found for this basename: LIPASE:5,AMYLASE:5 in the last 168 hours No results found for this basename: AMMONIA:5 in the last 168 hours CBC:  Lab 11/15/11 0625 11/14/11 0405 11/13/11 0357 11/12/11 0410 11/11/11 0830  WBC 10.0 9.7 9.0 11.4* 12.0*  NEUTROABS -- -- -- -- --  HGB 8.3* 8.0* 7.8* 7.7* 7.7*  HCT 24.2* 23.7* 22.5* 22.7* 22.3*  MCV 84.9 84.6 84.9 85.7 85.1  PLT 271 260 222 245 201   Cardiac Enzymes: No results found for this basename: CKTOTAL:5,CKMB:5,CKMBINDEX:5,TROPONINI:5 in the last 168 hours BNP: No components found with this basename: POCBNP:5 CBG:  Lab 11/14/11 2146 11/14/11 1651 11/14/11 1238 11/14/11 0806 11/13/11 2115  GLUCAP 179* 183* 212* 153* 180*    Recent Results (from the past 240 hour(s))  URINE CULTURE     Status: Normal   Collection Time   11/07/11  1:11 PM      Component Value Range Status Comment   Specimen Description URINE, CATHETERIZED   Final    Special Requests NONE   Final    Culture  Setup Time [id number]   Final    Colony Count >=100,000 COLONIES/ML   Final    Culture PSEUDOMONAS AERUGINOSA   Final    Report Status 11/10/2011 FINAL   Final    Organism ID, Bacteria PSEUDOMONAS AERUGINOSA   Final   MRSA PCR SCREENING     Status: Normal   Collection Time   11/07/11  6:27 PM       Component Value Range Status Comment   MRSA by PCR NEGATIVE  NEGATIVE Final   MRSA PCR SCREENING     Status: Normal   Collection Time   11/10/11  7:45 PM      Component Value Range Status Comment   MRSA by PCR NEGATIVE  NEGATIVE Final      Studies: Ct Abdomen Pelvis Wo Contrast  11/07/2011  *RADIOLOGY REPORT*  Clinical Data: Lower abdominal pain and ecchymosis.  Concern for rectus sheath hematoma.  On Lovenox.  CT ABDOMEN AND PELVIS WITHOUT CONTRAST  Technique:  Multidetector CT imaging of the abdomen and pelvis was performed following the standard  protocol without intravenous contrast.  Comparison: 06/12/2011, chest CT same date  Findings: Trace left pleural fluid or thickening is slightly increased since the prior exam.  Previously seen right lower lobe pulmonary nodule no longer identified. Presumed cardiac leads partly visualized.  Left anterolateral abdominal wall subcutaneous stranding is noted, asymmetrically more prominent than on the right, with foci of stranding and gas bilaterally indicative of injection site.  There is fusiform enlargement of the left rectus musculature with internal fluid fluid level and internal dependent hyperdensity measuring 14.0 x 13.4 x 7.8 cm.  Inferiorly, there is persistent internal fusiform enlargement of the left rectus musculature extending to the level of the symphysis pubis.  This area is not included in the measurement of the dominant fluid density presumed hematoma.  Gallstones noted without other CT evidence for acute cholecystitis. Low density renal lesions are identified, statistically most likely cysts but not further evaluated due to their small size.  These are stable.  Unenhanced liver, adrenal glands, spleen, and pancreas are normal.  No intra-abdominal or retroperitoneal fluid collection. No free air.  No bowel wall thickening. Scattered colonic diverticuli noted without evidence for diverticulitis. Prostatic brachytherapy seeds are noted.  Bilateral  small fat containing inguinal hernias.  No acute osseous abnormality.  IMPRESSION: Extensive fusiform enlargement of the left rectus abdominus muscle compatible with hemorrhage, with focal internal hematoma with fluid fluid level as measured above.  No acute intra-abdominal or pelvic pathology. These results were called by telephone on 11/07/2011  at  11:35 a.m. to  Dr. Bernette Mayers, who verbally acknowledged these results.  Original Report Authenticated By: Harrel Lemon, M.D.   Dg Chest 2 View  11/05/2011  *RADIOLOGY REPORT*  Clinical Data: Abnormal chest radiograph  CHEST - 2 VIEW  Comparison: 11/01/2011; 03/02/2008; 02/20/2007; chest CT - 06/12/2011  Findings: Grossly unchanged enlarged cardiac silhouette and mediastinal contours with mild tortuosity within the thoracic aorta.  Thoracic aorta calcifications.  Post median sternotomy CABG.  Stable positioning of support apparatus.  Persistent left infrahilar heterogeneous possible airspace opacities.  The previously identified 9 mm nodule within the right lower lobe seen on prior chest CT is not well depicted on this examination.  The lungs are hyperinflated with flattening of bilateral hemidiaphragms.  There is chronic blunting of the right costophrenic angle without definite pleural effusion.  No pneumothorax.  Grossly unchanged bones.  IMPRESSION: Grossly unchanged left infrahilar heterogeneous air space opacities, worrisome for infection.  Given the stability of this finding as well as the known 1 cm nodule within the right lower lobe (not well depicted on this examination but seen on recent chest CT from 06/12/2011), further evaluation with chest CT is recommended.  These results will be called to the ordering clinician or representative by the Radiologist Assistant, and communication documented in the PACS Dashboard.  Original Report Authenticated By: Waynard Reeds, M.D.   Dg Chest 2 View  11/01/2011  *RADIOLOGY REPORT*  Clinical Data: Cough.  CHEST -  2 VIEW  Comparison: Chest CT 06/12/2011.  Findings: Median sternotomy.  Right subclavian dual lead cardiac pacemaker.  CABG.  Aortic arch atherosclerosis.  Heart size mildly enlarged for projection.  There is opacity along the left heart border on the frontal view. The lateral view, this appears posterior to the heart shadow, interposed between the thoracic spine and heart.  This is suspicious for a small focus of perihilar pneumonia which may extend to the lingula.  The preliminary report was truncated and it is unclear whether  pneumonia was considered.  The right lung appears clear.  Radiographic follow-up is recommended to ensure clearing and exclude an underlying lesion.  Radiographic clearing is usually observed at 6 weeks.  IMPRESSION: Patchy airspace opacity in the anterior left lower lobe with blurring of the left heart border not present on prior exam, suspicious for pneumonia.  Clinically significant discrepancy from primary report, if provided: I do not have the complete preliminary report.  The opacity dorsal to the heart shadow was described however no diagnosis was given.  Original Report Authenticated By: Andreas Newport, M.D.   Dg Abd 1 View  11/08/2011  *RADIOLOGY REPORT*  Clinical Data: Abdominal pain.  Ileus.  ABDOMEN - 1 VIEW  Comparison: 11/07/2011.  Findings: Left abdominal wall hematoma is visualized on the supine radiographs.  There is no dilation of small bowel.  No evidence of obstruction.  No gross plain film evidence of free air.  Prostate brachytherapy seeds are present.  IMPRESSION: No evidence of obstruction or ileus.  Left abdominal wall hematoma.  Original Report Authenticated By: Andreas Newport, M.D.   Ct Chest Wo Contrast  11/07/2011  *RADIOLOGY REPORT*  Clinical Data: Weakness, cough and shortness of breath.  Evaluate for metastatic disease.  Recent diagnosis of pneumonia.  CT CHEST WITHOUT CONTRAST  Technique:  Multidetector CT imaging of the chest was performed following  the standard protocol without IV contrast.  Comparison: CT of chest without contrast 06/12/2011.  Findings:  Mediastinum: The thyroid gland is again massively enlarged and heterogeneous in appearance, particularly the left lobe which has significant substernal extension along the high left paratracheal region.  This area is heterogeneous in attenuation and has multiple internal calcifications. This is similar to remote prior study from 04/09/2007, and presumably represents a large goiter.  No pathologically enlarged mediastinal or hilar lymph nodes. Heart size is mildly enlarged. There is no significant pericardial fluid, thickening or pericardial calcification. There is atherosclerosis of the thoracic aorta, the great vessels of the mediastinum and the coronary arteries, including calcified atherosclerotic plaque in the left main, left anterior descending, ramus intermedius, left circumflex and right coronary arteries. The patient is status post median sternotomy for CABG with a LIMA to the LAD.  A right-sided subclavian pacemaker device is in place with lead tips terminating in the right atrial appendage and right ventricular apex.  The esophagus is unremarkable in appearance.  Lungs/Pleura: Previously noted 1 cm nodule in the right lower lobe has completely resolved (presumably infectious or inflammatory on the prior study).  There is a tiny amount of left-sided pleural fluid and/or thickening dependently, and there is a small amount of atelectasis in the inferior segment of the lingula and periphery of the left lower lobe.  Dependent atelectasis is also noted in the right lower lobe.  No acute consolidative airspace disease.  No definite suspicious appearing pulmonary nodules or masses are identified.  Upper Abdomen: Incidental imaging through the upper abdomen is remarkable for marked thickening of the left rectus abdominus musculature, and high attenuation fluid accumulating within this region, concerning for  rectus sheath hematoma.  Numerous colonic diverticula are noted.  Partially calcified gallstones are seen layering dependently within the lumen of the gallbladder, but there are no signs to suggest acute cholecystitis at this time.  Musculoskeletal: Median sternotomy wires. There are no aggressive appearing lytic or blastic lesions noted in the visualized portions of the skeleton.  IMPRESSION:  1.  No evidence of pneumonia at this time. 2. Interval development of thickening and high  attenuation in the left rectus sheath, concerning for a rectus sheath hematoma in this patient with history of atrial fibrillation on anticoagulation therapy. 3.  Previously noted right lower lobe pulmonary nodule has resolved compared to the prior study, indicative that this was of infectious or inflammatory etiology on the prior study. 4.  Trace left-sided pleural fluid and/or thickening with minimal bibasilar subsegmental atelectasis. 5. Atherosclerosis, including extensive coronary artery disease. Please note that although the presence of coronary artery calcium documents the presence of coronary artery disease, the severity of this disease and any potential stenosis cannot be assessed on this non-gated CT examination.   The patient is status post median sternotomy for CABG with a LIMA to the LAD. 6.  Cholelithiasis without findings to suggest acute cholecystitis. 7.  Colonic diverticulosis. 8.  Additional incidental findings, as detailed above.  Original Report Authenticated By: Florencia Reasons, M.D.   US Renal  11/10/2011  *RADIOLOGY REPORT*  Clinical Data: Acute superimposed upon chronic renal insufficiency.  RENAL/URINARY TRACT ULTRASOUND COMPLETE  Comparison:  Unenhanced CT abdomen and pelvis 11/07/2011, 06/10/2011.  Findings:  Right Kidney:  No hydronephrosis.  Severe diffuse cortical thinning.  Approximate 2.4 x 2.2 x 2.9 cm cyst arising from the lower pole and approximate 1.5 x 1.6 x 1.6 cm exophytic cyst arising from  the mid kidney, as noted on the CT.  No solid parenchymal masses.  No shadowing calculi.  Approximate 12.5 cm in length.  Left Kidney:  No hydronephrosis.  Severe diffuse cortical thinning. Approximate to point, 1.9 x 2.2 cm cyst arising from the mid kidney, as noted on the CT.  No solid parenchymal masses.  No shadowing calculi.  Approximate 13.4 cm in length.  Bladder:  Decompressed by Foley catheter.  Other findings:  The left rectus sheath hematoma identified on recent CT was noted during imaging of the kidneys.  IMPRESSION:  1.  No evidence of obstruction involving either kidney. 2.  Severe diffuse cortical thinning bilaterally and bilateral renal cysts as noted on the recent CT.  No solid renal masses.  Original Report Authenticated By: Arnell Sieving, M.D.   Dg Chest Port 1 View  11/10/2011  *RADIOLOGY REPORT*  Clinical Data: Follow up respiratory failure and bibasilar atelectasis.  PORTABLE CHEST - 1 VIEW 11/10/2011 0620 hours:  Comparison: Portable chest x-ray yesterday.  Two-view chest x-ray 11/05/2011.  Findings: Prior sternotomy for CABG.  Cardiac silhouette moderately enlarged but stable.  Mild pulmonary venous hypertension without overt edema, improved.  Improved aeration in the lung bases, with mild atelectasis persisting.  Improved left pleural effusion, though a small to moderate sized effusion persists.  No new abnormalities.  Right subclavian dual lead transvenous pacemaker unchanged.  IMPRESSION: Improved aeration in the lung bases, with mild atelectasis persisting.  Improving left pleural effusion, though a small to moderate sized effusion persists.  Stable cardiomegaly without edema.  No new abnormalities.  Original Report Authenticated By: Arnell Sieving, M.D.   Dg Chest Port 1 View  11/09/2011  *RADIOLOGY REPORT*  Clinical Data: Respiratory failure.  PORTABLE CHEST - 1 VIEW  Comparison: Chest x-ray 11/05/2011.  Findings: Lung volumes are very low.  There are extensive bibasilar  opacities, favored to predominately reflect subsegmental atelectasis, however, sequelae of aspiration or developing infection is difficult to entirely exclude.  Blunting of the left costophrenic sulcus is compatible with a small left-sided pleural effusion.  Severe pulmonary venous congestion, likely accentuated by patient's low lung volumes.  There could be some very mild  interstitial pulmonary edema at this time, however, this is not favored (interstitial prominence is favored to be related to low lung volumes and underpenetration of the film).  Heart size is mildly enlarged (unchanged). The patient is rotated to the left on today's exam, resulting in distortion of the mediastinal contours and reduced diagnostic sensitivity and specificity for mediastinal pathology.  Atherosclerotic calcifications within the arch of the aorta.  Status post median sternotomy for CABG.  Right-sided pacemaker device again noted, but film is too under penetrated to allow an accurate assessment of the leads.  IMPRESSION: 1.  Low volume chest with probable bibasilar subsegmental atelectasis and a small left-sided pleural effusion. 2.  Severe pulmonary venous congestion without frank pulmonary edema. 3.  Mild cardiomegaly is unchanged. 4.  Atherosclerosis. 5.  Postoperative changes and support apparatus, as above.  Original Report Authenticated By: Florencia Reasons, M.D.    Scheduled Meds:   . amiodarone  200 mg Oral Daily  . antiseptic oral rinse  15 mL Mouth Rinse BID  . atorvastatin  20 mg Oral q1800  . cefTAZidime (FORTAZ)  IV  1 g Intravenous Q24H  . darbepoetin (ARANESP) injection - NON-DIALYSIS  150 mcg Subcutaneous Q Tue-1800  . docusate sodium  100 mg Oral Daily  . ferumoxytol  510 mg Intravenous Q72H  . finasteride  5 mg Oral Daily  . furosemide  160 mg Oral BID  . metoprolol  50 mg Oral BID  . pantoprazole  40 mg Oral Q1200  . ursodiol  300 mg Oral BID   Continuous Infusions:

## 2011-11-15 NOTE — Evaluation (Signed)
Physical Therapy Evaluation Patient Details Name: NEIMAN ROOTS MRN: [id number] DOB: 26-Aug-1928 Today's Date: 11/15/2011 Time: 4098-1191 PT Time Calculation (min): 18 min  PT Assessment / Plan / Recommendation Clinical Impression  Mr. Lemmons is 78 y/o male admitted for rectus sheath hematoma. Presents to physical therapy today with decreased activity tolerance, mobiltiy and balance secondary to prolonged bed rest. Will benefit physical therapy in the acute setting to address these and the below problem list so as to maximize function and independence to increase safety for d/c home. Rec HHPT and RW.  Very supportive family, pt will have great support at home. Wants to be home for his 83st birthday  [birth year] (Sunday).     PT Assessment  Patient needs continued PT services    Follow Up Recommendations  Home health PT;Supervision for mobility/OOB    Barriers to Discharge        Equipment Recommendations  Rolling walker with 5" wheels    Recommendations for Other Services OT consult   Frequency Min 3X/week    Precautions / Restrictions Precautions Precautions: Fall         Mobility  Bed Mobility Bed Mobility: Supine to Sit;Sitting - Scoot to Edge of Bed Supine to Sit: HOB elevated;4: Min assist (20 degrees) Sitting - Scoot to Edge of Bed: 6: Modified independent (Device/Increase time) Details for Bed Mobility Assistance: pt initiated supine->sit independently but then pt pulling on pt's son (HHA) to bring trunk upright Transfers Transfers: Sit to Stand;Stand to Sit Sit to Stand: 4: Min assist;With upper extremity assist;From bed Stand to Sit: 4: Min assist;With upper extremity assist;To chair/3-in-1 Details for Transfer Assistance: cues for proper hand placement and safe technique as well as stability upon standing, cues for proper set up prior to sit using RW and safe hand placement Ambulation/Gait Ambulation/Gait Assistance: 4: Min assist Ambulation Distance (Feet): 16  Feet Assistive device: Rolling walker Ambulation/Gait Assistance Details: amb with slower shuffled gait, cues for negotiation of RW especially when turning and backing up to chair, slight instability likely because this is the first time he has ambulated in a week Gait Pattern: Trunk flexed;Shuffle    Exercises     PT Diagnosis: Difficulty walking;Abnormality of gait;Generalized weakness;Acute pain  PT Problem List: Decreased strength;Decreased activity tolerance;Decreased balance;Decreased mobility;Decreased knowledge of use of DME;Pain;Obesity;Cardiopulmonary status limiting activity PT Treatment Interventions: DME instruction;Gait training;Stair training;Functional mobility training;Therapeutic activities;Therapeutic exercise;Balance training;Neuromuscular re-education;Patient/family education   PT Goals Acute Rehab PT Goals PT Goal Formulation: With patient Time For Goal Achievement: 11/22/11 Potential to Achieve Goals: Good Pt will go Supine/Side to Sit: with modified independence PT Goal: Supine/Side to Sit - Progress: Goal set today Pt will go Sit to Supine/Side: with modified independence PT Goal: Sit to Supine/Side - Progress: Goal set today Pt will go Sit to Stand: with modified independence PT Goal: Sit to Stand - Progress: Goal set today Pt will go Stand to Sit: with modified independence PT Goal: Stand to Sit - Progress: Goal set today Pt will Transfer Bed to Chair/Chair to Bed: with modified independence PT Transfer Goal: Bed to Chair/Chair to Bed - Progress: Goal set today Pt will Ambulate: >150 feet;with modified independence;with least restrictive assistive device PT Goal: Ambulate - Progress: Goal set today Pt will Go Up / Down Stairs: 3-5 stairs;with min assist PT Goal: Up/Down Stairs - Progress: Goal set today Pt will Perform Home Exercise Program: Independently PT Goal: Perform Home Exercise Program - Progress: Goal set today  Visit Information  Last PT Received  On: 11/15/11 Assistance Needed: +1    Subjective Data  Subjective: Im still feeling a bit nauseous.   Prior Functioning  Home Living Lives With: Spouse Available Help at Discharge: Family;Available 24 hours/day Type of Home: House Home Access: Stairs to enter [employer] of Steps: 5 or 7 (probably will go up the front with 5 steps, no railing)  Entrance Stairs-Rails: None Home Layout: Two level;Laundry or work area in Artist of Steps: doesn't go downstairs anymore Home Adaptive Equipment: Straight cane Prior Function Level of Independence: Independent Able to Take Stairs?: Yes Driving: Yes Vocation: Retired Musician: No difficulties    Cognition  Overall Cognitive Status: Appears within functional limits for tasks assessed/performed Arousal/Alertness: Awake/alert Orientation Level: Appears intact for tasks assessed Behavior During Session: [facility] for tasks performed    Extremity/Trunk Assessment Right Upper Extremity Assessment RUE ROM/Strength/Tone: Within functional levels RUE Sensation: WFL - Light Touch;WFL - Proprioception RUE Coordination: WFL - gross/fine motor Left Upper Extremity Assessment LUE ROM/Strength/Tone: Within functional levels LUE Sensation: WFL - Light Touch;WFL - Proprioception LUE Coordination: WFL - gross/fine motor Right Lower Extremity Assessment RLE ROM/Strength/Tone: Deficits RLE ROM/Strength/Tone Deficits: general weakness with decreased activity tolerance for ambulation RLE Sensation: WFL - Light Touch;WFL - Proprioception RLE Coordination: WFL - gross/fine motor Left Lower Extremity Assessment LLE ROM/Strength/Tone: Deficits LLE ROM/Strength/Tone Deficits: general weakness with decreased activity tolerance for ambulation LLE Sensation: WFL - Light Touch;WFL - Proprioception LLE Coordination: WFL - gross/fine motor   Balance    End of Session PT - End of Session Equipment  Utilized During Treatment: Gait belt Activity Tolerance: Patient tolerated treatment well Patient left: in chair;with call bell/phone within reach;with family/visitor present Nurse Communication: Mobility status  GP     [facility] HELEN 11/15/2011, 8:42 AM

## 2011-11-15 NOTE — Evaluation (Signed)
Occupational Therapy Evaluation Patient Details Name: Jeffery Gibson MRN: [id number] DOB: 1928-09-12 Today's Date: 11/15/2011 Time: 4098-1191 OT Time Calculation (min): 20 min  OT Assessment / Plan / Recommendation Clinical Impression  Patient is 26- y/o male s/p Rectus Sheath Hematoma presenting to acute OT with all education completed. No further OT services needed; will sign off. Provided patient and sons with education and information on obtaining a high rise toilet seat with handles if they feel it would make it easier at home with toilet transfers. Patient and family verbalized understanding. Patient  was very independent at home and commented that he's been doing things for himself  for a long time.    OT Assessment  Patient does not need any further OT services    Follow Up Recommendations  No OT follow up       Equipment Recommendations  3 in 1 bedside comode (recommended. Patient did not verbalize need/want for DME.)          Precautions / Restrictions Precautions Precautions: Fall   Pertinent Vitals/Pain Patient reports 5/10 pain level in left lateral abdominal area. Patient refused pain meds.    ADL  Grooming: Simulated;Supervision/safety Where Assessed - Grooming: Supported sitting Upper Body Bathing: Simulated;Supervision/safety Where Assessed - Upper Body Bathing: Unsupported sitting Lower Body Bathing: Simulated;Minimal assistance Where Assessed - Lower Body Bathing: Supported sitting Upper Body Dressing: Simulated;Supervision/safety Where Assessed - Upper Body Dressing: Unsupported sitting Lower Body Dressing: Simulated;Minimal assistance Where Assessed - Lower Body Dressing: Supported sit to stand Toilet Transfer: Performed;Min guard Statistician Method: Sit to Barista: Regular height toilet Toileting - Architect and Hygiene: Simulated;Supervision/safety Where Assessed - Engineer, mining and Hygiene:  Sit to stand from 3-in-1 or toilet Equipment Used: Rolling walker Transfers/Ambulation Related to ADLs: patient transfers at [employer] level with RW. ADL Comments: patient reports that wife helps him with socks at home.       Visit Information  Last OT Received On: 11/15/11 Assistance Needed: +1    Subjective Data  Subjective: "I don't want any pain meds." Patient Stated Goal: Go Home.   Prior Functioning  Home Living Lives With: Spouse Available Help at Discharge: Family;Available 24 hours/day Type of Home: House Home Access: Stairs to enter [employer] of Steps: 5 or 7 (probably will go up the front with 5 steps, no railing)  Entrance Stairs-Rails: None Home Layout: Two level;Laundry or work area in Artist of Steps: doesn't go downstairs anymore Bathroom Shower/Tub: Psychologist, counselling;Door Charity fundraiser without back;Straight cane Prior Function Level of Independence: Independent with assistive device(s) (help with socks) Able to Take Stairs?: Yes Driving: Yes Vocation: 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         End of Session OT - End of Session Activity Tolerance: Patient tolerated treatment well Patient left: in chair;with call bell/phone within reach;with family/visitor present     Jeanene Erb, OTR/L [phone] 11/15/2011, 9:15 AM

## 2011-11-16 DIAGNOSIS — M7981 Nontraumatic hematoma of soft tissue: Secondary | ICD-10-CM

## 2011-11-16 DIAGNOSIS — I959 Hypotension, unspecified: Secondary | ICD-10-CM

## 2011-11-16 DIAGNOSIS — N170 Acute kidney failure with tubular necrosis: Secondary | ICD-10-CM

## 2011-11-16 DIAGNOSIS — R112 Nausea with vomiting, unspecified: Secondary | ICD-10-CM

## 2011-11-16 LAB — RENAL FUNCTION PANEL
Albumin: 2.9 g/dL — ABNORMAL LOW (ref 3.5–5.2)
BUN: 87 mg/dL — ABNORMAL HIGH (ref 6–23)
Creatinine, Ser: 4.08 mg/dL — ABNORMAL HIGH (ref 0.50–1.35)
Phosphorus: 4.3 mg/dL (ref 2.3–4.6)

## 2011-11-16 LAB — GLUCOSE, CAPILLARY: Glucose-Capillary: 136 mg/dL — ABNORMAL HIGH (ref 70–99)

## 2011-11-16 LAB — CBC
MCH: 29.2 pg (ref 26.0–34.0)
MCV: 86.4 fL (ref 78.0–100.0)
Platelets: 277 10*3/uL (ref 150–400)
RDW: 15.5 % (ref 11.5–15.5)

## 2011-11-16 MED ORDER — SACCHAROMYCES BOULARDII 250 MG PO CAPS: 250.0000 mg | ORAL_CAPSULE | Freq: Two times a day (BID) | ORAL | Status: AC

## 2011-11-16 MED ORDER — FUROSEMIDE 80 MG PO TABS: 80.0000 mg | ORAL_TABLET | Freq: Two times a day (BID) | ORAL | Status: DC

## 2011-11-16 MED ORDER — FUROSEMIDE 80 MG PO TABS
160.0000 mg | ORAL_TABLET | Freq: Once | ORAL | Status: AC
  Administered 2011-11-16: 160 mg via ORAL
  Filled 2011-11-16: qty 2

## 2011-11-16 MED ORDER — SODIUM CHLORIDE 0.9 % IJ SOLN
10.0000 mL | INTRAMUSCULAR | Status: DC | PRN
  Administered 2011-11-17 – 2011-11-19 (×7): 10 mL

## 2011-11-16 MED ORDER — DEXTROSE 5 % IV SOLN: 1.0000 g | INTRAVENOUS | Status: DC

## 2011-11-16 MED ORDER — FUROSEMIDE 80 MG PO TABS
80.0000 mg | ORAL_TABLET | Freq: Two times a day (BID) | ORAL | Status: DC
  Administered 2011-11-16 – 2011-11-19 (×6): 80 mg via ORAL
  Filled 2011-11-16 (×8): qty 1

## 2011-11-16 MED ORDER — POTASSIUM CHLORIDE CRYS ER 20 MEQ PO TBCR
20.0000 meq | EXTENDED_RELEASE_TABLET | Freq: Two times a day (BID) | ORAL | Status: AC
  Administered 2011-11-16 (×2): 20 meq via ORAL
  Filled 2011-11-16: qty 1

## 2011-11-16 NOTE — Progress Notes (Signed)
Peripherally Inserted Central Catheter/Midline Placement  The IV Nurse has discussed with the patient and/or persons authorized to consent for the patient, the purpose of this procedure and the potential benefits and risks involved with this procedure.  The benefits include less needle sticks, lab draws from the catheter and patient may be discharged home with the catheter.  Risks include, but not limited to, infection, bleeding, blood clot (thrombus formation), and puncture of an artery; nerve damage and irregular heat beat.  Alternatives to this procedure were also discussed.  PICC/Midline Placement Documentation        [name] 11/16/2011, 3:32 PM

## 2011-11-16 NOTE — Progress Notes (Signed)
Physical Therapy Treatment Patient Details Name: Jeffery Gibson MRN: [id number] DOB: [birth year] Today's Date: 11/16/2011 Time: 1478-2956 PT Time Calculation (min): 21 min  PT Assessment / Plan / Recommendation Comments on Treatment Session  Admitted for rectal sheath hematoma. Making great progress. Will need to practice stairs prior to d/c home, did not have the energy to do them today.     Follow Up Recommendations  Home health PT;Supervision for mobility/OOB    Barriers to Discharge        Equipment Recommendations  Rolling walker with 5" wheels;3 in 1 bedside comode    Recommendations for Other Services    Frequency Min 3X/week   Plan Discharge plan remains appropriate;Frequency remains appropriate    Precautions / Restrictions Precautions Precautions: Fall Restrictions Weight Bearing Restrictions: No       Mobility  Bed Mobility Supine to Sit: 5: Supervision;HOB flat Sitting - Scoot to Edge of Bed: 6: Modified independent (Device/Increase time) Details for Bed Mobility Assistance: pt with labored effort getting out of flat bed today, sequencing cues for efficiency Transfers Transfers: Sit to Stand;Stand to Sit Sit to Stand: 4: Min guard;With upper extremity assist Stand to Sit: 4: Min guard;With upper extremity assist Details for Transfer Assistance: cues for safe hand placement and follow through with regards to RW, cues for safe sequencing in prep to sit and safe positioning with RW Ambulation/Gait Ambulation/Gait Assistance: 4: Min guard Ambulation Distance (Feet): 100 Feet Assistive device: Rolling walker Ambulation/Gait Assistance Details: cues for upright posture and forward gaze, continues to have shuffled feet with slower cadence; definitely fatigued when finished ambulating, sats maintained greater than 90% on RA during gait Gait Pattern: Step-through pattern;Trunk flexed    Exercises      PT Goals Acute Rehab PT Goals PT Goal: Supine/Side to Sit -  Progress: Progressing toward goal PT Goal: Sit to Stand - Progress: Progressing toward goal PT Goal: Stand to Sit - Progress: Progressing toward goal PT Transfer Goal: Bed to Chair/Chair to Bed - Progress: Progressing toward goal PT Goal: Ambulate - Progress: Progressing toward goal  Visit Information  Last PT Received On: 11/16/11 Assistance Needed: +1    Subjective Data  Subjective: I sat up in that chair until 7 yesterday evening.    Cognition  Overall Cognitive Status: Appears within functional limits for tasks assessed/performed Arousal/Alertness: Awake/alert Orientation Level: Appears intact for tasks assessed Behavior During Session: [facility] for tasks performed    Balance     End of Session PT - End of Session Equipment Utilized During Treatment: Gait belt Activity Tolerance: Patient tolerated treatment well Patient left: in chair;with call bell/phone within reach;with family/visitor present Nurse Communication: Mobility status   GP     [facility] HELEN 11/16/2011, 11:31 AM

## 2011-11-16 NOTE — Progress Notes (Signed)
[facility] PROGRESS NOTE  LARON BOORMAN RUE:[id number] DOB: 1928-12-14 DOA: 11/07/2011   Assessment/Plan: Patient Active Hospital Problem List: Rectus sheath hematoma (11/07/2011)/Acute hemorrhagic shock secondary to left rectus sheath hematoma/Acute blood loss anemia (11/07/2011) -On Lovenox and coumadin prior to admit which are now on hold, pt receive FFP and Received 2 units PRBCs  this admit. - Hgb remains stable.  Oliguric ATN/ARF in setting of CKD III  Secondary to hemorrhagic shock/ARB and NSAID's on admission,  baseline creatinine is around 2  -had been oliguric earlier in admit with UOP less than 500cc, but with Lasix urine output has improved, creatinine  appears to improving the high as it was it was 5.2 today is 4.0. We'll continue Lasix at a lower dose and replete his potassium.  -Nephrology following.   Coagulopathy (11/07/2011) continue to hold anticoagulation. Resumes as an outpatient.  Paroxysmal a-fib (11/09/2011) Continue amiodarone and lopressor for rate control. No anticoagulation see above.  Diabetes mellitus/hypoglycemic events:  Likely d/t continued oral meds/lantus with decreased po intake while inpt + ARF. Amaryl d/c'd but may take longer to get out of system given oliguria, Lantus d/c'd. Will discontinue carb modified diet for now.  Volume overload:  Responding well to IV diuresis. lung are clear. Yesterday was negative about 800 cc has minimal trace edema.   Pseudomonas UTI:  -Current abx tx should cover adequately based on sensitivities , he'll continue in and out catheter -will neede a PICC to complete antibitoics Fortaz for 10 days.  Hx of prostate ca  Requires chronic I/O cath at home, d'c foley in and out cath.   Prophylaxis:  SCD's for dvt prophylaxis  Consultants:  [facility] cardiology  Nephrology  Admitted by PCCM and transferred to [facility] on 6/22  Lambert Keto, MD  [facility] Pager [phone]  If 7PM-7AM, please  contact night-coverage [email or web address] Password [email or web address] 11/16/2011, 9:55 AM   LOS: 9 days    Antibiotics:  fortaz 11/12/2011    Interim History: 78 year old male w/ hx of prostate cancer and chronic AF, scheduled for a procedure on prostate at [facility] which was cancelled because he had developed a pneumonia for which he was tx'd as an outpt. He had his coumadin stopped and was bridging with lovenox. He had been rescheduled to have procedure, but was not getting better so he was started back on his coumadin 6/18. He is also apparently still getting lovenox. The am of 6/19, he was up to the bathroom when he felt weak and dizzy. Denies LOC, but stated he felt like he was going to pass out. His wife helped him back to bed and EMS was called. They report he was pale on arrival but BP and HR were normal. Denied any CP, SOB, vomiting, diarrhea, blood in stool or fever. Did report significant left sided abd pain. During evaluation was found to have some mild orthostasis, w/ staff noting his SBP drifting down w/ postural changes. He had a CT of abd/pelvis to eval pain. This showed a left rectus sheath hematoma. He subsequentially developed ARF due to ATN from volume loss and was transferred from [facility] to [facility] on 6/22.    Subjective: No complains feels better.   Objective: Filed Vitals:   11/15/11 1038 11/15/11 1300 11/15/11 2141 11/16/11 0356  BP: 127/72 105/63 147/65 117/60  Pulse: 89 60 65 62  Temp:  97.3 F (36.3 C) 98.1 F (36.7 C) 98.1 F (36.7 C)  TempSrc:  Oral Oral Oral  Resp:  18 20 20   Height:      Weight:      SpO2:  98% 95% 95%    Intake/Output Summary (Last 24 hours) at 11/16/11 0955 Last data filed at 11/16/11 0500  Gross per 24 hour  Intake    520 ml  Output   1525 ml  Net  -1005 ml   Weight change:   Exam:  General: Alert, awake, oriented x3, in no acute distress.  HEENT: No bruits, no goiter.  Heart: Regular rate and rhythm, without murmurs, rubs, gallops. Trace  edema Lungs:good air movement CTA b/l Abdomen: Soft, nontender, nondistended, positive bowel sounds.  Neuro: Grossly intact, nonfocal.   Data Reviewed: Basic Metabolic Panel:  Lab 11/16/11 4098 11/15/11 0625 11/14/11 0405 11/13/11 0400 11/12/11 0410  NA 130* 129* 127* 127* 129*  K 3.2* 3.4* -- -- --  CL 86* 86* 86* 88* 92*  CO2 31 29 27 24 23   GLUCOSE 127* 152* 163* 106* 60*  BUN 87* 85* 84* 81* 80*  CREATININE 4.08* 4.25* 4.43* 4.67* 5.32*  CALCIUM 9.3 8.9 8.6 8.4 8.4  MG -- -- 1.8 -- --  PHOS 4.3 4.5 5.1* 5.3* 5.2*   Liver Function Tests:  Lab 11/16/11 0703 11/15/11 0625 11/14/11 0405 11/13/11 0400 11/12/11 0410  AST -- -- -- -- --  ALT -- -- -- -- --  ALKPHOS -- -- -- -- --  BILITOT -- -- -- -- --  PROT -- -- -- -- --  ALBUMIN 2.9* 2.8* 2.7* 2.6* 2.7*   No results found for this basename: LIPASE:5,AMYLASE:5 in the last 168 hours No results found for this basename: AMMONIA:5 in the last 168 hours CBC:  Lab 11/16/11 0700 11/15/11 0625 11/14/11 0405 11/13/11 0357 11/12/11 0410  WBC 10.2 10.0 9.7 9.0 11.4*  NEUTROABS -- -- -- -- --  HGB 8.6* 8.3* 8.0* 7.8* 7.7*  HCT 25.5* 24.2* 23.7* 22.5* 22.7*  MCV 86.4 84.9 84.6 84.9 85.7  PLT 277 271 260 222 245   Cardiac Enzymes: No results found for this basename: CKTOTAL:5,CKMB:5,CKMBINDEX:5,TROPONINI:5 in the last 168 hours BNP: No components found with this basename: POCBNP:5 CBG:  Lab 11/16/11 0753 11/15/11 2144 11/15/11 1702 11/15/11 1205 11/15/11 0846  GLUCAP 128* 162* 175* 216* 144*    Recent Results (from the past 240 hour(s))  URINE CULTURE     Status: Normal   Collection Time   11/07/11  1:11 PM      Component Value Range Status Comment   Specimen Description URINE, CATHETERIZED   Final    Special Requests NONE   Final    Culture  Setup Time [id number]   Final    Colony Count >=100,000 COLONIES/ML   Final    Culture PSEUDOMONAS AERUGINOSA   Final    Report Status 11/10/2011 FINAL   Final    Organism ID,  Bacteria PSEUDOMONAS AERUGINOSA   Final   MRSA PCR SCREENING     Status: Normal   Collection Time   11/07/11  6:27 PM      Component Value Range Status Comment   MRSA by PCR NEGATIVE  NEGATIVE Final   MRSA PCR SCREENING     Status: Normal   Collection Time   11/10/11  7:45 PM      Component Value Range Status Comment   MRSA by PCR NEGATIVE  NEGATIVE Final      Studies: Ct Abdomen Pelvis Wo Contrast  11/07/2011  *RADIOLOGY REPORT*  Clinical Data: Lower abdominal pain and ecchymosis.  Concern for rectus sheath hematoma.  On Lovenox.  CT ABDOMEN AND PELVIS WITHOUT CONTRAST  Technique:  Multidetector CT imaging of the abdomen and pelvis was performed following the standard protocol without intravenous contrast.  Comparison: 06/12/2011, chest CT same date  Findings: Trace left pleural fluid or thickening is slightly increased since the prior exam.  Previously seen right lower lobe pulmonary nodule no longer identified. Presumed cardiac leads partly visualized.  Left anterolateral abdominal wall subcutaneous stranding is noted, asymmetrically more prominent than on the right, with foci of stranding and gas bilaterally indicative of injection site.  There is fusiform enlargement of the left rectus musculature with internal fluid fluid level and internal dependent hyperdensity measuring 14.0 x 13.4 x 7.8 cm.  Inferiorly, there is persistent internal fusiform enlargement of the left rectus musculature extending to the level of the symphysis pubis.  This area is not included in the measurement of the dominant fluid density presumed hematoma.  Gallstones noted without other CT evidence for acute cholecystitis. Low density renal lesions are identified, statistically most likely cysts but not further evaluated due to their small size.  These are stable.  Unenhanced liver, adrenal glands, spleen, and pancreas are normal.  No intra-abdominal or retroperitoneal fluid collection. No free air.  No bowel wall thickening.  Scattered colonic diverticuli noted without evidence for diverticulitis. Prostatic brachytherapy seeds are noted.  Bilateral small fat containing inguinal hernias.  No acute osseous abnormality.  IMPRESSION: Extensive fusiform enlargement of the left rectus abdominus muscle compatible with hemorrhage, with focal internal hematoma with fluid fluid level as measured above.  No acute intra-abdominal or pelvic pathology. These results were called by telephone on 11/07/2011  at  11:35 a.m. to  Dr. Bernette Mayers, who verbally acknowledged these results.  Original Report Authenticated By: Harrel Lemon, M.D.   Dg Chest 2 View  11/05/2011  *RADIOLOGY REPORT*  Clinical Data: Abnormal chest radiograph  CHEST - 2 VIEW  Comparison: 11/01/2011; 03/02/2008; 02/20/2007; chest CT - 06/12/2011  Findings: Grossly unchanged enlarged cardiac silhouette and mediastinal contours with mild tortuosity within the thoracic aorta.  Thoracic aorta calcifications.  Post median sternotomy CABG.  Stable positioning of support apparatus.  Persistent left infrahilar heterogeneous possible airspace opacities.  The previously identified 9 mm nodule within the right lower lobe seen on prior chest CT is not well depicted on this examination.  The lungs are hyperinflated with flattening of bilateral hemidiaphragms.  There is chronic blunting of the right costophrenic angle without definite pleural effusion.  No pneumothorax.  Grossly unchanged bones.  IMPRESSION: Grossly unchanged left infrahilar heterogeneous air space opacities, worrisome for infection.  Given the stability of this finding as well as the known 1 cm nodule within the right lower lobe (not well depicted on this examination but seen on recent chest CT from 06/12/2011), further evaluation with chest CT is recommended.  These results will be called to the ordering clinician or representative by the Radiologist Assistant, and communication documented in the PACS Dashboard.  Original Report  Authenticated By: Waynard Reeds, M.D.   Dg Chest 2 View  11/01/2011  *RADIOLOGY REPORT*  Clinical Data: Cough.  CHEST - 2 VIEW  Comparison: Chest CT 06/12/2011.  Findings: Median sternotomy.  Right subclavian dual lead cardiac pacemaker.  CABG.  Aortic arch atherosclerosis.  Heart size mildly enlarged for projection.  There is opacity along the left heart border on the frontal view. The lateral view, this appears posterior to the heart shadow, interposed between the thoracic  spine and heart.  This is suspicious for a small focus of perihilar pneumonia which may extend to the lingula.  The preliminary report was truncated and it is unclear whether pneumonia was considered.  The right lung appears clear.  Radiographic follow-up is recommended to ensure clearing and exclude an underlying lesion.  Radiographic clearing is usually observed at 6 weeks.  IMPRESSION: Patchy airspace opacity in the anterior left lower lobe with blurring of the left heart border not present on prior exam, suspicious for pneumonia.  Clinically significant discrepancy from primary report, if provided: I do not have the complete preliminary report.  The opacity dorsal to the heart shadow was described however no diagnosis was given.  Original Report Authenticated By: Andreas Newport, M.D.   Dg Abd 1 View  11/08/2011  *RADIOLOGY REPORT*  Clinical Data: Abdominal pain.  Ileus.  ABDOMEN - 1 VIEW  Comparison: 11/07/2011.  Findings: Left abdominal wall hematoma is visualized on the supine radiographs.  There is no dilation of small bowel.  No evidence of obstruction.  No gross plain film evidence of free air.  Prostate brachytherapy seeds are present.  IMPRESSION: No evidence of obstruction or ileus.  Left abdominal wall hematoma.  Original Report Authenticated By: Andreas Newport, M.D.   Ct Chest Wo Contrast  11/07/2011  *RADIOLOGY REPORT*  Clinical Data: Weakness, cough and shortness of breath.  Evaluate for metastatic disease.  Recent  diagnosis of pneumonia.  CT CHEST WITHOUT CONTRAST  Technique:  Multidetector CT imaging of the chest was performed following the standard protocol without IV contrast.  Comparison: CT of chest without contrast 06/12/2011.  Findings:  Mediastinum: The thyroid gland is again massively enlarged and heterogeneous in appearance, particularly the left lobe which has significant substernal extension along the high left paratracheal region.  This area is heterogeneous in attenuation and has multiple internal calcifications. This is similar to remote prior study from 04/09/2007, and presumably represents a large goiter.  No pathologically enlarged mediastinal or hilar lymph nodes. Heart size is mildly enlarged. There is no significant pericardial fluid, thickening or pericardial calcification. There is atherosclerosis of the thoracic aorta, the great vessels of the mediastinum and the coronary arteries, including calcified atherosclerotic plaque in the left main, left anterior descending, ramus intermedius, left circumflex and right coronary arteries. The patient is status post median sternotomy for CABG with a LIMA to the LAD.  A right-sided subclavian pacemaker device is in place with lead tips terminating in the right atrial appendage and right ventricular apex.  The esophagus is unremarkable in appearance.  Lungs/Pleura: Previously noted 1 cm nodule in the right lower lobe has completely resolved (presumably infectious or inflammatory on the prior study).  There is a tiny amount of left-sided pleural fluid and/or thickening dependently, and there is a small amount of atelectasis in the inferior segment of the lingula and periphery of the left lower lobe.  Dependent atelectasis is also noted in the right lower lobe.  No acute consolidative airspace disease.  No definite suspicious appearing pulmonary nodules or masses are identified.  Upper Abdomen: Incidental imaging through the upper abdomen is remarkable for marked  thickening of the left rectus abdominus musculature, and high attenuation fluid accumulating within this region, concerning for rectus sheath hematoma.  Numerous colonic diverticula are noted.  Partially calcified gallstones are seen layering dependently within the lumen of the gallbladder, but there are no signs to suggest acute cholecystitis at this time.  Musculoskeletal: Median sternotomy wires. There are no aggressive appearing  lytic or blastic lesions noted in the visualized portions of the skeleton.  IMPRESSION:  1.  No evidence of pneumonia at this time. 2. Interval development of thickening and high attenuation in the left rectus sheath, concerning for a rectus sheath hematoma in this patient with history of atrial fibrillation on anticoagulation therapy. 3.  Previously noted right lower lobe pulmonary nodule has resolved compared to the prior study, indicative that this was of infectious or inflammatory etiology on the prior study. 4.  Trace left-sided pleural fluid and/or thickening with minimal bibasilar subsegmental atelectasis. 5. Atherosclerosis, including extensive coronary artery disease. Please note that although the presence of coronary artery calcium documents the presence of coronary artery disease, the severity of this disease and any potential stenosis cannot be assessed on this non-gated CT examination.   The patient is status post median sternotomy for CABG with a LIMA to the LAD. 6.  Cholelithiasis without findings to suggest acute cholecystitis. 7.  Colonic diverticulosis. 8.  Additional incidental findings, as detailed above.  Original Report Authenticated By: Florencia Reasons, M.D.   US Renal  11/10/2011  *RADIOLOGY REPORT*  Clinical Data: Acute superimposed upon chronic renal insufficiency.  RENAL/URINARY TRACT ULTRASOUND COMPLETE  Comparison:  Unenhanced CT abdomen and pelvis 11/07/2011, 06/10/2011.  Findings:  Right Kidney:  No hydronephrosis.  Severe diffuse cortical thinning.   Approximate 2.4 x 2.2 x 2.9 cm cyst arising from the lower pole and approximate 1.5 x 1.6 x 1.6 cm exophytic cyst arising from the mid kidney, as noted on the CT.  No solid parenchymal masses.  No shadowing calculi.  Approximate 12.5 cm in length.  Left Kidney:  No hydronephrosis.  Severe diffuse cortical thinning. Approximate to point, 1.9 x 2.2 cm cyst arising from the mid kidney, as noted on the CT.  No solid parenchymal masses.  No shadowing calculi.  Approximate 13.4 cm in length.  Bladder:  Decompressed by Foley catheter.  Other findings:  The left rectus sheath hematoma identified on recent CT was noted during imaging of the kidneys.  IMPRESSION:  1.  No evidence of obstruction involving either kidney. 2.  Severe diffuse cortical thinning bilaterally and bilateral renal cysts as noted on the recent CT.  No solid renal masses.  Original Report Authenticated By: Arnell Sieving, M.D.   Dg Chest Port 1 View  11/10/2011  *RADIOLOGY REPORT*  Clinical Data: Follow up respiratory failure and bibasilar atelectasis.  PORTABLE CHEST - 1 VIEW 11/10/2011 0620 hours:  Comparison: Portable chest x-ray yesterday.  Two-view chest x-ray 11/05/2011.  Findings: Prior sternotomy for CABG.  Cardiac silhouette moderately enlarged but stable.  Mild pulmonary venous hypertension without overt edema, improved.  Improved aeration in the lung bases, with mild atelectasis persisting.  Improved left pleural effusion, though a small to moderate sized effusion persists.  No new abnormalities.  Right subclavian dual lead transvenous pacemaker unchanged.  IMPRESSION: Improved aeration in the lung bases, with mild atelectasis persisting.  Improving left pleural effusion, though a small to moderate sized effusion persists.  Stable cardiomegaly without edema.  No new abnormalities.  Original Report Authenticated By: Arnell Sieving, M.D.   Dg Chest Port 1 View  11/09/2011  *RADIOLOGY REPORT*  Clinical Data: Respiratory failure.   PORTABLE CHEST - 1 VIEW  Comparison: Chest x-ray 11/05/2011.  Findings: Lung volumes are very low.  There are extensive bibasilar opacities, favored to predominately reflect subsegmental atelectasis, however, sequelae of aspiration or developing infection is difficult to entirely exclude.  Blunting of  the left costophrenic sulcus is compatible with a small left-sided pleural effusion.  Severe pulmonary venous congestion, likely accentuated by patient's low lung volumes.  There could be some very mild interstitial pulmonary edema at this time, however, this is not favored (interstitial prominence is favored to be related to low lung volumes and underpenetration of the film).  Heart size is mildly enlarged (unchanged). The patient is rotated to the left on today's exam, resulting in distortion of the mediastinal contours and reduced diagnostic sensitivity and specificity for mediastinal pathology.  Atherosclerotic calcifications within the arch of the aorta.  Status post median sternotomy for CABG.  Right-sided pacemaker device again noted, but film is too under penetrated to allow an accurate assessment of the leads.  IMPRESSION: 1.  Low volume chest with probable bibasilar subsegmental atelectasis and a small left-sided pleural effusion. 2.  Severe pulmonary venous congestion without frank pulmonary edema. 3.  Mild cardiomegaly is unchanged. 4.  Atherosclerosis. 5.  Postoperative changes and support apparatus, as above.  Original Report Authenticated By: Florencia Reasons, M.D.    Scheduled Meds:    . amiodarone  200 mg Oral Daily  . antiseptic oral rinse  15 mL Mouth Rinse BID  . atorvastatin  20 mg Oral q1800  . cefTAZidime (FORTAZ)  IV  1 g Intravenous Q24H  . darbepoetin (ARANESP) injection - NON-DIALYSIS  150 mcg Subcutaneous Q Tue-1800  . docusate sodium  100 mg Oral Daily  . ferumoxytol  510 mg Intravenous Q72H  . finasteride  5 mg Oral Daily  . furosemide  80 mg Oral BID  . insulin glargine   15 Units Subcutaneous QHS  . metoprolol  50 mg Oral BID  . pantoprazole  40 mg Oral Q1200  . potassium chloride  20 mEq Oral BID  . saccharomyces boulardii  250 mg Oral BID  . ursodiol  300 mg Oral BID  . DISCONTD: furosemide  160 mg Oral BID   Continuous Infusions:

## 2011-11-16 NOTE — Progress Notes (Signed)
ANTIBIOTIC CONSULT NOTE   Pharmacy Consult for ceftazidime Indication: Pseudomonas UTI  Patient Measurements: Height: 5\' 7"  (170.2 cm) Weight: 265 lb 6.9 oz (120.4 kg) IBW/kg (Calculated) : 66.1    Vital Signs: Temp: 98.1 F (36.7 C) (06/28 0356) Temp src: Oral (06/28 0356) BP: 117/60 mmHg (06/28 0356) Pulse Rate: 62  (06/28 0356) Intake/Output from previous day: 06/27 0701 - 06/28 0700 In: 720 [P.O.:720] Out: 1525 [Urine:1525] Intake/Output from this shift:    Labs:  Basename 11/16/11 0703 11/16/11 0700 11/15/11 0625 11/14/11 0405  WBC -- 10.2 10.0 9.7  HGB -- 8.6* 8.3* 8.0*  PLT -- 277 271 260  LABCREA -- -- -- --  CREATININE 4.08* -- 4.25* 4.43*     Microbiology: Recent Results (from the past 720 hour(s))  URINE CULTURE     Status: Normal   Collection Time   11/07/11  1:11 PM      Component Value Range Status Comment   Specimen Description URINE, CATHETERIZED   Final    Special Requests NONE   Final    Culture  Setup Time [id number]   Final    Colony Count >=100,000 COLONIES/ML   Final    Culture PSEUDOMONAS AERUGINOSA   Final    Report Status 11/10/2011 FINAL   Final    Organism ID, Bacteria PSEUDOMONAS AERUGINOSA   Final   MRSA PCR SCREENING     Status: Normal   Collection Time   11/07/11  6:27 PM      Component Value Range Status Comment   MRSA by PCR NEGATIVE  NEGATIVE Final   MRSA PCR SCREENING     Status: Normal   Collection Time   11/10/11  7:45 PM      Component Value Range Status Comment   MRSA by PCR NEGATIVE  NEGATIVE Final     Medications:  Zosyn 6/22->6/23 (received 3 doses) Ceftaxidime 6/23-->  Assessment: 82 yoM  on ceftazidime for pseudomonas UTI, also with noted recent PNA.  Pt afebrile, WBC normalized.  Acute on CKD with CrCl ~15 ml/min scr slowly improving.  Plan:  1.  Ceftazidime 1g IV q 24 hours.  2.  F/u renal fxn, labs, clinical course.   Severiano Gilbert 11/16/2011,11:40 AM

## 2011-11-16 NOTE — Progress Notes (Addendum)
[facility]  Subjective:  Awake, alert, wife and daughter at bedside.  He would like to be home for his birthday on Sunday! No SOB, less edema   Objective: Vital signs in last 24 hours: Blood pressure 117/60, pulse 62, temperature 98.1 F (36.7 C), temperature source Oral, resp. rate 20, height 5\' 7"  (1.702 m), weight 120.4 kg (265 lb 6.9 oz), SpO2 92.00%. PHYSICAL EXAM General--awake, alert, friendly Chest--clear Heart--no rub Abd--ecchymoses on ant abd wall Extr--tr edema   Weight  Date  22 Jun  123.8 kg  25 Jun  127.4 kg  26 Jun  120.5 kg  27 Jun  120.4 28 Jun  Not done  I/O yesterday--720/1525   Lab Results:   Lab 11/16/11 0703 11/15/11 0625 11/14/11 0405  NA 130* 129* 127*  K 3.2* 3.4* 4.0  CL 86* 86* 86*  CO2 31 29 27   BUN 87* 85* 84*  CREATININE 4.08* 4.25* 4.43*  ALB -- -- --  GLUCOSE 127* -- --  CALCIUM 9.3 8.9 8.6  PHOS 4.3 4.5 5.1*     Basename 11/16/11 0700 11/15/11 0625  WBC 10.2 10.0  HGB 8.6* 8.3*  HCT 25.5* 24.2*  PLT 277 271   Assessment/Plan: 1. Acuterenal failure superimposed on CKD III - Cr a little lower today.  I<O with po furosemide 80 BID.  K low today.  On po KCl 20 BID--may need more.  In diuretic phase of ATN need to watch for vol depletion and hypokalemia 2. Rectus sheath bleed/hematoma- s/p 4 units PRBC's, Hgb 8.6 today. Fe/TIBC 8% sat. . IV Feraheme (IV Fe) 510 x 2. On150 mcg aranesp wk  3. Chronic afib - per primary, off anticoagulation for now. On amio and metoprolol 4. DM - on oral agents at home. Glucose 127 today  5. HTN- ARB  on hold, was also on metoprolol at home and is getting metoprolol here. BP 117/60.  May need to decrease B blocker if BP decreases further (with diuresis) 6. Prostate Ca- s/p brachytherapy in past. Had been scheduled for orchiectomy which was reason coumadin was stopped and lovenox started (for "bridging" prior to orchiectomy) 7. Hyponatremia: improving with po furosemide 8. Pseudomonas  UTI--on fortaz. foley cath  out .  Try to avoid PICC since it will destroy any future dialysis access site (does he still need to be on fortaz?)     LOS: 9 days   Cyerra Yim F 11/16/2011,12:01 PM   .[email or web address]

## 2011-11-17 DIAGNOSIS — R112 Nausea with vomiting, unspecified: Secondary | ICD-10-CM

## 2011-11-17 DIAGNOSIS — M7981 Nontraumatic hematoma of soft tissue: Secondary | ICD-10-CM

## 2011-11-17 DIAGNOSIS — N170 Acute kidney failure with tubular necrosis: Secondary | ICD-10-CM

## 2011-11-17 DIAGNOSIS — I959 Hypotension, unspecified: Secondary | ICD-10-CM

## 2011-11-17 LAB — GLUCOSE, CAPILLARY
Glucose-Capillary: 132 mg/dL — ABNORMAL HIGH (ref 70–99)
Glucose-Capillary: 169 mg/dL — ABNORMAL HIGH (ref 70–99)
Glucose-Capillary: 173 mg/dL — ABNORMAL HIGH (ref 70–99)

## 2011-11-17 LAB — CBC
Hemoglobin: 8.9 g/dL — ABNORMAL LOW (ref 13.0–17.0)
MCH: 28.7 pg (ref 26.0–34.0)
MCV: 87.4 fL (ref 78.0–100.0)
RBC: 3.1 MIL/uL — ABNORMAL LOW (ref 4.22–5.81)

## 2011-11-17 LAB — RENAL FUNCTION PANEL
BUN: 86 mg/dL — ABNORMAL HIGH (ref 6–23)
CO2: 29 mEq/L (ref 19–32)
Chloride: 89 mEq/L — ABNORMAL LOW (ref 96–112)
Creatinine, Ser: 3.91 mg/dL — ABNORMAL HIGH (ref 0.50–1.35)

## 2011-11-17 NOTE — Progress Notes (Signed)
Physical Therapy Note: attempted to see pt at 1245 but unable due to pt eating lunch. Returned at 1410 and pt just returned to bed from bathroom and reports fatigue and unable to perform any further mobility at this time. Will defer to later date. Thanks Delaney Meigs, PT [phone]

## 2011-11-17 NOTE — Progress Notes (Signed)
 [facility]  Subjective:  Awake, alert, reminded me that his birthday is tomorrow.  Wife in cafeteria. PICC in L antecub vein  Objective: Vital signs in last 24 hours: Blood pressure 125/68, pulse 70, temperature 97.7 F (36.5 C), temperature source Oral, resp. rate 18, height 5\' 7"  (1.702 m), weight 115 kg (253 lb 8.5 oz), SpO2 96.00%.    PHYSICAL EXAM General--as above Chest--clear Heart--no rub Abd--ecchymoses on abd wall Extr-- tr edema   Date   WEIGHT 22 Jun  123.8 kg  25 Jun   127.4 kg  26 Jun  120.5 kg  27 Jun  120.4  28 Jun  Not done 29 Jun  115 kg  I/O yesterday-300/1250  Lab Results:   Lab 11/17/11 0727 11/16/11 0703 11/15/11 0625  NA 132* 130* 129*  K 3.6 3.2* 3.4*  CL 89* 86* 86*  CO2 29 31 29   BUN 86* 87* 85*  CREATININE 3.91* 4.08* 4.25*  ALB -- -- --  GLUCOSE 131* -- --  CALCIUM 9.3 9.3 8.9  PHOS 3.6 4.3 4.5     Basename 11/17/11 0727 11/16/11 0700  WBC 10.0 10.2  HGB 8.9* 8.6*  HCT 27.1* 25.5*  PLT 285 277    Assessment/Plan:    1. Acuterenal failure superimposed on CKD III - Cr a little lower today. I<O with po furosemide 80 BID. K 3.6 today. On po KCl 20 BID--. In diuretic phase of ATN need to watch for vol depletion and hypokalemia 2. Rectus sheath bleed/hematoma- s/p 4 units PRBC's, Hgb 8.9 today. Fe/TIBC 8% sat. . IV Feraheme (IV Fe) 510 x 2. On150 mcg aranesp wk  3. Chronic afib - per primary, off anticoagulation for now. On amio and metoprolol 4. DM - on oral agents at home. Glucose 131 today  5. HTN- ARB on hold, was also on metoprolol at home and is getting metoprolol here. BP 125/68 today. May need to decrease B blocker if BP decreases further (with diuresis) 6. Prostate Ca- s/p brachytherapy in past. Had been scheduled for orchiectomy which was reason coumadin was stopped and lovenox started (for "bridging" prior to orchiectomy) 7. Hyponatremia: improving with po furosemide 8. Pseudomonas UTI--on fortaz. foley  cath out . Urinating ok without I/O cath (so far)  He'd like to go home (especially for his birthday tomorrow).  However, it's not clear how much furosemide he'll need, not clear how much KCl he'll need, how low Cr will go (baseline 2.1-2.3), and whether Na will continue to improve.  I'd suggest another 2-3 days hospitalization to follow these issues before d/c        9.    LOS: 10 days   Leotha Voeltz F 11/17/2011,11:42 AM   .labalb

## 2011-11-18 DIAGNOSIS — M7981 Nontraumatic hematoma of soft tissue: Secondary | ICD-10-CM

## 2011-11-18 DIAGNOSIS — I959 Hypotension, unspecified: Secondary | ICD-10-CM

## 2011-11-18 DIAGNOSIS — R112 Nausea with vomiting, unspecified: Secondary | ICD-10-CM

## 2011-11-18 DIAGNOSIS — N170 Acute kidney failure with tubular necrosis: Secondary | ICD-10-CM

## 2011-11-18 LAB — RENAL FUNCTION PANEL
BUN: 85 mg/dL — ABNORMAL HIGH (ref 6–23)
Calcium: 9.4 mg/dL (ref 8.4–10.5)
Glucose, Bld: 203 mg/dL — ABNORMAL HIGH (ref 70–99)
Phosphorus: 3.3 mg/dL (ref 2.3–4.6)

## 2011-11-18 LAB — CBC
HCT: 28.5 % — ABNORMAL LOW (ref 39.0–52.0)
Hemoglobin: 9.4 g/dL — ABNORMAL LOW (ref 13.0–17.0)
MCH: 29.5 pg (ref 26.0–34.0)
MCHC: 33 g/dL (ref 30.0–36.0)

## 2011-11-18 LAB — GLUCOSE, CAPILLARY: Glucose-Capillary: 164 mg/dL — ABNORMAL HIGH (ref 70–99)

## 2011-11-18 NOTE — Progress Notes (Signed)
Pt desat to 88% on room air. o2 applied at 2liters now sating at 99%. Jeffery Beal Haskins-Scott, LPN

## 2011-11-18 NOTE — Progress Notes (Signed)
Physical Therapy Treatment Patient Details Name: Jeffery Gibson MRN: [id number] DOB: Aug 12, 1928 Today's Date: 11/18/2011 Time: 4098-1191 PT Time Calculation (min): 29 min  PT Assessment / Plan / Recommendation Comments on Treatment Session  Stair training complete; OK for dc from PT standpoint; (Happy Birthday!)    Follow Up Recommendations  Home health PT;Supervision for mobility/OOB    Barriers to Discharge        Equipment Recommendations  Rolling walker with 5" wheels;3 in 1 bedside comode    Recommendations for Other Services    Frequency Min 3X/week   Plan Discharge plan remains appropriate;Frequency remains appropriate    Precautions / Restrictions Precautions Precautions: Fall Restrictions Weight Bearing Restrictions: No   Pertinent Vitals/Pain Reports no pain; Session conducted on Room Air, noted O2 sats ranged 88-92% with seated rest at end of session; RN made aware    Mobility  Bed Mobility Bed Mobility: Supine to Sit Supine to Sit: 5: Supervision Details for Bed Mobility Assistance: Seems to need less effort today; Reports will have plenty of help at home Transfers Transfers: Sit to Stand;Stand to Sit Sit to Stand: 4: Min guard;With upper extremity assist Stand to Sit: 4: Min guard;With upper extremity assist Details for Transfer Assistance: Son, [name] present and provided correct assistance, steadying RW prn Ambulation/Gait Ambulation/Gait Assistance: 4: Min guard (without physical contact) Ambulation Distance (Feet): 30 Feet (pt opted to not walk as far to save energy for dc) Assistive device: Rolling walker Ambulation/Gait Assistance Details: smooth progression and did not note any gross shuffling or swing clearance issues Gait Pattern: Step-through pattern;Trunk flexed Stairs: Yes Stairs Assistance: 4: Min assist Stairs Assistance Details (indicate cue type and reason): Verbal and demo cues for technique, sequence and optimal position of son for  providing assist Stair Management Technique: One rail Right;Forwards (pt and son state there is a rail R on back steps) Number of Stairs: 5     Exercises     PT Diagnosis:    PT Problem List:   PT Treatment Interventions:     PT Goals Acute Rehab PT Goals Time For Goal Achievement: 11/22/11 Potential to Achieve Goals: Good Pt will go Supine/Side to Sit: with modified independence PT Goal: Supine/Side to Sit - Progress: Progressing toward goal Pt will go Sit to Stand: with modified independence PT Goal: Sit to Stand - Progress: Progressing toward goal Pt will go Stand to Sit: with modified independence PT Goal: Stand to Sit - Progress: Progressing toward goal Pt will Ambulate: >150 feet;with modified independence;with least restrictive assistive device PT Goal: Ambulate - Progress: Progressing toward goal Pt will Go Up / Down Stairs: 3-5 stairs;with min assist PT Goal: Up/Down Stairs - Progress: Met  Visit Information  Last PT Received On: 11/18/11 Assistance Needed: +1    Subjective Data  Subjective: Hoping to go home today for his birthday Patient Stated Goal: home today   Cognition  Overall Cognitive Status: Appears within functional limits for tasks assessed/performed Arousal/Alertness: Awake/alert Orientation Level: Appears intact for tasks assessed Behavior During Session: [name] for tasks performed    Balance     End of Session PT - End of Session Equipment Utilized During Treatment: Gait belt Activity Tolerance: Patient tolerated treatment well Patient left: in chair;with call bell/phone within reach;with family/visitor present Nurse Communication: Mobility status   GP     [name], [place] [phone]  11/18/2011, 10:33 AM

## 2011-11-18 NOTE — Progress Notes (Signed)
[facility]  Subjective:  Awake, wife and daughter celebrating birthday with him!   Objective: Vital signs in last 24 hours: Blood pressure 127/70, pulse 64, temperature 97.7 F (36.5 C), temperature source Oral, resp. rate 18, height 5\' 7"  (1.702 m), weight 117.9 kg (259 lb 14.8 oz), SpO2 96.00%.    PHYSICAL EXAM General--awake, alert Chest--clear Heart--no rub Abd--ecchymoses and abd wall and R flank fading, nontender Extr--tr edema  Date   WEIGHT  22 Jun   123.8 kg  25 Jun  127.4 kg  26 Jun  120.5 kg  27 Jun  120.4  28 Jun  Not done  29 Jun  115 kg 30 Jun  117.9 kg  I/O yesterday  ??/650  Lab Results:   Lab 11/18/11 0920 11/17/11 0727 11/16/11 0703  NA 134* 132* 130*  K 3.8 3.6 3.2*  CL 90* 89* 86*  CO2 31 29 31   BUN 85* 86* 87*  CREATININE 3.79* 3.91* 4.08*  ALB -- -- --  GLUCOSE 203* -- --  CALCIUM 9.4 9.3 9.3  PHOS 3.3 3.6 4.3     Basename 11/18/11 0920 11/17/11 0727  WBC 9.7 10.0  HGB 9.4* 8.9*  HCT 28.5* 27.1*  PLT 266 285    Assessment/Plan: 1. Acuterenal failure superimposed on CKD III - Cr a little lower today. I<O with po furosemide 80 BID. K 3.6 today. On po KCl 20 BID--. In diuretic phase of ATN need to watch for vol depletion and hypokalemia 2. Rectus sheath bleed/hematoma- s/p 4 units PRBC's, Hgb 8.9 today. Fe/TIBC 8% sat. . IV Feraheme (IV Fe) 510 x 2. On150 mcg aranesp wk  3. Chronic afib - per primary, off anticoagulation for now. On amio and metoprolol 4. DM - on oral agents at home. Glucose 131 today  5. HTN- ARB on hold, was also on metoprolol at home and is getting metoprolol here. BP 125/68 today. May need to decrease B blocker if BP decreases further (with diuresis) 6. Prostate Ca- s/p brachytherapy in past. Had been scheduled for orchiectomy which was reason coumadin was stopped and lovenox started (for "bridging" prior to orchiectomy) 7. Hyponatremia: improving with po furosemide 8. Pseudomonas UTI--on fortaz.  foley cath out . Urinating ok without I/O cath (so far) 9. Anemia:  Fe/TIBC 8% sat, ferritin 267.  Received IV Fe, aranesp 150 mcg given on 25 Jun   Renal fct slowly recovering.  Still on Furosemide 80 BID (was on furosemide prn PTA). K 3.8 today.  On no po KCl today.  If he can be followed closely as outpt to be certain he doesn't become vol depleted (on furosemide) or hypokalemic, then he may be able to be d/c'd.  Dr. Connye Burkitt is his primary care doxc.  If he can get lab and be checked 3 times a week for the above. Then (maybe) he could go home tomorrow.  Baseline Cr ~ 2.2.  He will need f/u at [facility] for planned orchiectomy   LOS: 11 days   Jeffery Gibson F 11/18/2011,1:20 PM   .labalb

## 2011-11-18 NOTE — Progress Notes (Signed)
[facility] PROGRESS NOTE  Jeffery Gibson ZOX:[id number] DOB: 03-Dec-1928 DOA: 11/07/2011   Assessment/Plan: Patient Active Hospital Problem List: Rectus sheath hematoma (11/07/2011)/Acute hemorrhagic shock secondary to left rectus sheath hematoma/Acute blood loss anemia (11/07/2011) -On Lovenox and coumadin prior to admit which are now on hold, pt receive FFP and Received 2 units PRBCs  this admit- Hgb remains stable.  Oliguric ATN/ARF in setting of CKD III  Secondary to hemorrhagic shock/ARB and NSAID's on admission,  baseline creatinine is around 2  Urine output continues to improve UOP 650, but with now Lasix 80 mg twice a day and potassium supplements to-Nephrology following -basic metabolic panel pending.   Coagulopathy (11/07/2011) continue to hold anticoagulation. Resumes as an outpatient.  Paroxysmal a-fib (11/09/2011) -Rate control-Continue amiodarone and lopressor for rate control- No anticoagulation see above.  Diabetes mellitus/hypoglycemic events:  -oral meds/lantus with decreased po intake while inpt + ARF. Amaryl d/c'd but may take longer to get out of system given oliguria, Lantus 15 units. Will discontinue carb modified diet for now.  Volume overload:  Responding well to IV diuresis. lung are clear. Yesterday was negative about 650cc.  Pseudomonas UTI:  -Current abx tx should cover adequately based on sensitivities , he'll continue in and out catheter -will neede a PICC to complete antibitoics Fortaz.  Hx of prostate ca  Requires chronic I/O cath at home, d'c foley in and out cath.   Prophylaxis:  SCD's for dvt prophylaxis  Consultants:  [facility] cardiology  Nephrology  Admitted by PCCM and transferred to [facility] on 6/22  Lambert Keto, MD  [facility] Pager [phone]  If 7PM-7AM, please contact night-coverage [email or web address] Password [email or web address] 11/18/2011, 10:37 AM   LOS: 11 days    Antibiotics:  fortaz 11/12/2011    Interim  History: 78 year old male w/ hx of prostate cancer and chronic AF, scheduled for a procedure on prostate at [facility] which was cancelled because he had developed a pneumonia for which he was tx'd as an outpt. He had his coumadin stopped and was bridging with lovenox. He had been rescheduled to have procedure, but was not getting better so he was started back on his coumadin 6/18. He is also apparently still getting lovenox. The am of 6/19, he was up to the bathroom when he felt weak and dizzy. Denies LOC, but stated he felt like he was going to pass out. His wife helped him back to bed and EMS was called. They report he was pale on arrival but BP and HR were normal. Denied any CP, SOB, vomiting, diarrhea, blood in stool or fever. Did report significant left sided abd pain. During evaluation was found to have some mild orthostasis, w/ staff noting his SBP drifting down w/ postural changes. He had a CT of abd/pelvis to eval pain. This showed a left rectus sheath hematoma. He subsequentially developed ARF due to ATN from volume loss and was transferred from [facility] to [facility] on 6/22.    Subjective: No complains feels better.   Objective: Filed Vitals:   11/17/11 0622 11/17/11 1400 11/17/11 2159 11/18/11 0502  BP: 125/68 138/70 147/67 127/70  Pulse: 70 69 70 64  Temp: 97.7 F (36.5 C) 98 F (36.7 C) 97.8 F (36.6 C) 97.7 F (36.5 C)  TempSrc: Oral Oral Oral Oral  Resp: 18 19 18 18   Height:      Weight: 115 kg (253 lb 8.5 oz)   117.9 kg (259 lb 14.8 oz)  SpO2: 96% 95% 96% 96%  Intake/Output Summary (Last 24 hours) at 11/18/11 1037 Last data filed at 11/18/11 0049  Gross per 24 hour  Intake      0 ml  Output    500 ml  Net   -500 ml   Weight change: 2.9 kg (6 lb 6.3 oz)  Exam:  General: Alert, awake, oriented x3, in no acute distress.  HEENT: No bruits, no goiter.  Heart: Regular rate and rhythm, without murmurs, rubs, gallops. Trace edema Lungs:good air movement CTA b/l Abdomen:  Soft, nontender, nondistended, positive bowel sounds.  Neuro: Grossly intact, nonfocal.   Data Reviewed: Basic Metabolic Panel:  Lab 11/17/11 1610 11/16/11 0703 11/15/11 0625 11/14/11 0405 11/13/11 0400  NA 132* 130* 129* 127* 127*  K 3.6 3.2* -- -- --  CL 89* 86* 86* 86* 88*  CO2 29 31 29 27 24   GLUCOSE 131* 127* 152* 163* 106*  BUN 86* 87* 85* 84* 81*  CREATININE 3.91* 4.08* 4.25* 4.43* 4.67*  CALCIUM 9.3 9.3 8.9 8.6 8.4  MG -- -- -- 1.8 --  PHOS 3.6 4.3 4.5 5.1* 5.3*   Liver Function Tests:  Lab 11/17/11 0727 11/16/11 0703 11/15/11 0625 11/14/11 0405 11/13/11 0400  AST -- -- -- -- --  ALT -- -- -- -- --  ALKPHOS -- -- -- -- --  BILITOT -- -- -- -- --  PROT -- -- -- -- --  ALBUMIN 3.0* 2.9* 2.8* 2.7* 2.6*   No results found for this basename: LIPASE:5,AMYLASE:5 in the last 168 hours No results found for this basename: AMMONIA:5 in the last 168 hours CBC:  Lab 11/18/11 0920 11/17/11 0727 11/16/11 0700 11/15/11 0625 11/14/11 0405  WBC 9.7 10.0 10.2 10.0 9.7  NEUTROABS -- -- -- -- --  HGB 9.4* 8.9* 8.6* 8.3* 8.0*  HCT 28.5* 27.1* 25.5* 24.2* 23.7*  MCV 89.3 87.4 86.4 84.9 84.6  PLT 266 285 277 271 260   Cardiac Enzymes: No results found for this basename: CKTOTAL:5,CKMB:5,CKMBINDEX:5,TROPONINI:5 in the last 168 hours BNP: No components found with this basename: POCBNP:5 CBG:  Lab 11/18/11 0739 11/17/11 2157 11/17/11 1710 11/17/11 1139 11/17/11 0724  GLUCAP 110* 168* 173* 169* 132*    Recent Results (from the past 240 hour(s))  MRSA PCR SCREENING     Status: Normal   Collection Time   11/10/11  7:45 PM      Component Value Range Status Comment   MRSA by PCR NEGATIVE  NEGATIVE Final      Studies: Ct Abdomen Pelvis Wo Contrast  11/07/2011  *RADIOLOGY REPORT*  Clinical Data: Lower abdominal pain and ecchymosis.  Concern for rectus sheath hematoma.  On Lovenox.  CT ABDOMEN AND PELVIS WITHOUT CONTRAST  Technique:  Multidetector CT imaging of the abdomen and pelvis  was performed following the standard protocol without intravenous contrast.  Comparison: 06/12/2011, chest CT same date  Findings: Trace left pleural fluid or thickening is slightly increased since the prior exam.  Previously seen right lower lobe pulmonary nodule no longer identified. Presumed cardiac leads partly visualized.  Left anterolateral abdominal wall subcutaneous stranding is noted, asymmetrically more prominent than on the right, with foci of stranding and gas bilaterally indicative of injection site.  There is fusiform enlargement of the left rectus musculature with internal fluid fluid level and internal dependent hyperdensity measuring 14.0 x 13.4 x 7.8 cm.  Inferiorly, there is persistent internal fusiform enlargement of the left rectus musculature extending to the level of the symphysis pubis.  This area is not included  in the measurement of the dominant fluid density presumed hematoma.  Gallstones noted without other CT evidence for acute cholecystitis. Low density renal lesions are identified, statistically most likely cysts but not further evaluated due to their small size.  These are stable.  Unenhanced liver, adrenal glands, spleen, and pancreas are normal.  No intra-abdominal or retroperitoneal fluid collection. No free air.  No bowel wall thickening. Scattered colonic diverticuli noted without evidence for diverticulitis. Prostatic brachytherapy seeds are noted.  Bilateral small fat containing inguinal hernias.  No acute osseous abnormality.  IMPRESSION: Extensive fusiform enlargement of the left rectus abdominus muscle compatible with hemorrhage, with focal internal hematoma with fluid fluid level as measured above.  No acute intra-abdominal or pelvic pathology. These results were called by telephone on 11/07/2011  at  11:35 a.m. to  Dr. Bernette Mayers, who verbally acknowledged these results.  Original Report Authenticated By: Harrel Lemon, M.D.   Dg Chest 2 View  11/05/2011  *RADIOLOGY  REPORT*  Clinical Data: Abnormal chest radiograph  CHEST - 2 VIEW  Comparison: 11/01/2011; 03/02/2008; 02/20/2007; chest CT - 06/12/2011  Findings: Grossly unchanged enlarged cardiac silhouette and mediastinal contours with mild tortuosity within the thoracic aorta.  Thoracic aorta calcifications.  Post median sternotomy CABG.  Stable positioning of support apparatus.  Persistent left infrahilar heterogeneous possible airspace opacities.  The previously identified 9 mm nodule within the right lower lobe seen on prior chest CT is not well depicted on this examination.  The lungs are hyperinflated with flattening of bilateral hemidiaphragms.  There is chronic blunting of the right costophrenic angle without definite pleural effusion.  No pneumothorax.  Grossly unchanged bones.  IMPRESSION: Grossly unchanged left infrahilar heterogeneous air space opacities, worrisome for infection.  Given the stability of this finding as well as the known 1 cm nodule within the right lower lobe (not well depicted on this examination but seen on recent chest CT from 06/12/2011), further evaluation with chest CT is recommended.  These results will be called to the ordering clinician or representative by the Radiologist Assistant, and communication documented in the PACS Dashboard.  Original Report Authenticated By: Waynard Reeds, M.D.   Dg Chest 2 View  11/01/2011  *RADIOLOGY REPORT*  Clinical Data: Cough.  CHEST - 2 VIEW  Comparison: Chest CT 06/12/2011.  Findings: Median sternotomy.  Right subclavian dual lead cardiac pacemaker.  CABG.  Aortic arch atherosclerosis.  Heart size mildly enlarged for projection.  There is opacity along the left heart border on the frontal view. The lateral view, this appears posterior to the heart shadow, interposed between the thoracic spine and heart.  This is suspicious for a small focus of perihilar pneumonia which may extend to the lingula.  The preliminary report was truncated and it is unclear  whether pneumonia was considered.  The right lung appears clear.  Radiographic follow-up is recommended to ensure clearing and exclude an underlying lesion.  Radiographic clearing is usually observed at 6 weeks.  IMPRESSION: Patchy airspace opacity in the anterior left lower lobe with blurring of the left heart border not present on prior exam, suspicious for pneumonia.  Clinically significant discrepancy from primary report, if provided: I do not have the complete preliminary report.  The opacity dorsal to the heart shadow was described however no diagnosis was given.  Original Report Authenticated By: Andreas Newport, M.D.   Dg Abd 1 View  11/08/2011  *RADIOLOGY REPORT*  Clinical Data: Abdominal pain.  Ileus.  ABDOMEN - 1 VIEW  Comparison:  11/07/2011.  Findings: Left abdominal wall hematoma is visualized on the supine radiographs.  There is no dilation of small bowel.  No evidence of obstruction.  No gross plain film evidence of free air.  Prostate brachytherapy seeds are present.  IMPRESSION: No evidence of obstruction or ileus.  Left abdominal wall hematoma.  Original Report Authenticated By: Andreas Newport, M.D.   Ct Chest Wo Contrast  11/07/2011  *RADIOLOGY REPORT*  Clinical Data: Weakness, cough and shortness of breath.  Evaluate for metastatic disease.  Recent diagnosis of pneumonia.  CT CHEST WITHOUT CONTRAST  Technique:  Multidetector CT imaging of the chest was performed following the standard protocol without IV contrast.  Comparison: CT of chest without contrast 06/12/2011.  Findings:  Mediastinum: The thyroid gland is again massively enlarged and heterogeneous in appearance, particularly the left lobe which has significant substernal extension along the high left paratracheal region.  This area is heterogeneous in attenuation and has multiple internal calcifications. This is similar to remote prior study from 04/09/2007, and presumably represents a large goiter.  No pathologically enlarged  mediastinal or hilar lymph nodes. Heart size is mildly enlarged. There is no significant pericardial fluid, thickening or pericardial calcification. There is atherosclerosis of the thoracic aorta, the great vessels of the mediastinum and the coronary arteries, including calcified atherosclerotic plaque in the left main, left anterior descending, ramus intermedius, left circumflex and right coronary arteries. The patient is status post median sternotomy for CABG with a LIMA to the LAD.  A right-sided subclavian pacemaker device is in place with lead tips terminating in the right atrial appendage and right ventricular apex.  The esophagus is unremarkable in appearance.  Lungs/Pleura: Previously noted 1 cm nodule in the right lower lobe has completely resolved (presumably infectious or inflammatory on the prior study).  There is a tiny amount of left-sided pleural fluid and/or thickening dependently, and there is a small amount of atelectasis in the inferior segment of the lingula and periphery of the left lower lobe.  Dependent atelectasis is also noted in the right lower lobe.  No acute consolidative airspace disease.  No definite suspicious appearing pulmonary nodules or masses are identified.  Upper Abdomen: Incidental imaging through the upper abdomen is remarkable for marked thickening of the left rectus abdominus musculature, and high attenuation fluid accumulating within this region, concerning for rectus sheath hematoma.  Numerous colonic diverticula are noted.  Partially calcified gallstones are seen layering dependently within the lumen of the gallbladder, but there are no signs to suggest acute cholecystitis at this time.  Musculoskeletal: Median sternotomy wires. There are no aggressive appearing lytic or blastic lesions noted in the visualized portions of the skeleton.  IMPRESSION:  1.  No evidence of pneumonia at this time. 2. Interval development of thickening and high attenuation in the left rectus  sheath, concerning for a rectus sheath hematoma in this patient with history of atrial fibrillation on anticoagulation therapy. 3.  Previously noted right lower lobe pulmonary nodule has resolved compared to the prior study, indicative that this was of infectious or inflammatory etiology on the prior study. 4.  Trace left-sided pleural fluid and/or thickening with minimal bibasilar subsegmental atelectasis. 5. Atherosclerosis, including extensive coronary artery disease. Please note that although the presence of coronary artery calcium documents the presence of coronary artery disease, the severity of this disease and any potential stenosis cannot be assessed on this non-gated CT examination.   The patient is status post median sternotomy for CABG with a LIMA to the  LAD. 6.  Cholelithiasis without findings to suggest acute cholecystitis. 7.  Colonic diverticulosis. 8.  Additional incidental findings, as detailed above.  Original Report Authenticated By: Florencia Reasons, M.D.   US Renal  11/10/2011  *RADIOLOGY REPORT*  Clinical Data: Acute superimposed upon chronic renal insufficiency.  RENAL/URINARY TRACT ULTRASOUND COMPLETE  Comparison:  Unenhanced CT abdomen and pelvis 11/07/2011, 06/10/2011.  Findings:  Right Kidney:  No hydronephrosis.  Severe diffuse cortical thinning.  Approximate 2.4 x 2.2 x 2.9 cm cyst arising from the lower pole and approximate 1.5 x 1.6 x 1.6 cm exophytic cyst arising from the mid kidney, as noted on the CT.  No solid parenchymal masses.  No shadowing calculi.  Approximate 12.5 cm in length.  Left Kidney:  No hydronephrosis.  Severe diffuse cortical thinning. Approximate to point, 1.9 x 2.2 cm cyst arising from the mid kidney, as noted on the CT.  No solid parenchymal masses.  No shadowing calculi.  Approximate 13.4 cm in length.  Bladder:  Decompressed by Foley catheter.  Other findings:  The left rectus sheath hematoma identified on recent CT was noted during imaging of the  kidneys.  IMPRESSION:  1.  No evidence of obstruction involving either kidney. 2.  Severe diffuse cortical thinning bilaterally and bilateral renal cysts as noted on the recent CT.  No solid renal masses.  Original Report Authenticated By: Arnell Sieving, M.D.   Dg Chest Port 1 View  11/10/2011  *RADIOLOGY REPORT*  Clinical Data: Follow up respiratory failure and bibasilar atelectasis.  PORTABLE CHEST - 1 VIEW 11/10/2011 0620 hours:  Comparison: Portable chest x-ray yesterday.  Two-view chest x-ray 11/05/2011.  Findings: Prior sternotomy for CABG.  Cardiac silhouette moderately enlarged but stable.  Mild pulmonary venous hypertension without overt edema, improved.  Improved aeration in the lung bases, with mild atelectasis persisting.  Improved left pleural effusion, though a small to moderate sized effusion persists.  No new abnormalities.  Right subclavian dual lead transvenous pacemaker unchanged.  IMPRESSION: Improved aeration in the lung bases, with mild atelectasis persisting.  Improving left pleural effusion, though a small to moderate sized effusion persists.  Stable cardiomegaly without edema.  No new abnormalities.  Original Report Authenticated By: Arnell Sieving, M.D.   Dg Chest Port 1 View  11/09/2011  *RADIOLOGY REPORT*  Clinical Data: Respiratory failure.  PORTABLE CHEST - 1 VIEW  Comparison: Chest x-ray 11/05/2011.  Findings: Lung volumes are very low.  There are extensive bibasilar opacities, favored to predominately reflect subsegmental atelectasis, however, sequelae of aspiration or developing infection is difficult to entirely exclude.  Blunting of the left costophrenic sulcus is compatible with a small left-sided pleural effusion.  Severe pulmonary venous congestion, likely accentuated by patient's low lung volumes.  There could be some very mild interstitial pulmonary edema at this time, however, this is not favored (interstitial prominence is favored to be related to low lung  volumes and underpenetration of the film).  Heart size is mildly enlarged (unchanged). The patient is rotated to the left on today's exam, resulting in distortion of the mediastinal contours and reduced diagnostic sensitivity and specificity for mediastinal pathology.  Atherosclerotic calcifications within the arch of the aorta.  Status post median sternotomy for CABG.  Right-sided pacemaker device again noted, but film is too under penetrated to allow an accurate assessment of the leads.  IMPRESSION: 1.  Low volume chest with probable bibasilar subsegmental atelectasis and a small left-sided pleural effusion. 2.  Severe pulmonary venous congestion without frank  pulmonary edema. 3.  Mild cardiomegaly is unchanged. 4.  Atherosclerosis. 5.  Postoperative changes and support apparatus, as above.  Original Report Authenticated By: Florencia Reasons, M.D.    Scheduled Meds:    . amiodarone  200 mg Oral Daily  . antiseptic oral rinse  15 mL Mouth Rinse BID  . atorvastatin  20 mg Oral q1800  . cefTAZidime (FORTAZ)  IV  1 g Intravenous Q24H  . darbepoetin (ARANESP) injection - NON-DIALYSIS  150 mcg Subcutaneous Q Tue-1800  . docusate sodium  100 mg Oral Daily  . finasteride  5 mg Oral Daily  . furosemide  80 mg Oral BID  . insulin glargine  15 Units Subcutaneous QHS  . metoprolol  50 mg Oral BID  . pantoprazole  40 mg Oral Q1200  . saccharomyces boulardii  250 mg Oral BID  . ursodiol  300 mg Oral BID   Continuous Infusions:

## 2011-11-19 DIAGNOSIS — N179 Acute kidney failure, unspecified: Secondary | ICD-10-CM

## 2011-11-19 DIAGNOSIS — N390 Urinary tract infection, site not specified: Secondary | ICD-10-CM

## 2011-11-19 LAB — RENAL FUNCTION PANEL
Albumin: 3 g/dL — ABNORMAL LOW (ref 3.5–5.2)
BUN: 84 mg/dL — ABNORMAL HIGH (ref 6–23)
Chloride: 92 mEq/L — ABNORMAL LOW (ref 96–112)
GFR calc Af Amer: 17 mL/min — ABNORMAL LOW (ref >90)
Potassium: 3.7 mEq/L (ref 3.5–5.1)

## 2011-11-19 LAB — GLUCOSE, CAPILLARY
Glucose-Capillary: 173 mg/dL — ABNORMAL HIGH (ref 70–99)
Glucose-Capillary: 133 mg/dL — ABNORMAL HIGH (ref 70–99)

## 2011-11-19 LAB — CBC
HCT: 27.7 % — ABNORMAL LOW (ref 39.0–52.0)
Platelets: 280 10*3/uL (ref 150–400)
RDW: 16.5 % — ABNORMAL HIGH (ref 11.5–15.5)
WBC: 8.7 10*3/uL (ref 4.0–10.5)

## 2011-11-19 MED ORDER — BD GETTING STARTED TAKE HOME KIT: 3/10ML X 30G SYRINGES
1.0000 | Freq: Once | Status: DC
  Filled 2011-11-19: qty 1

## 2011-11-19 MED ORDER — INSULIN GLARGINE 100 UNIT/ML ~~LOC~~ SOLN: 15.0000 [IU] | Freq: Every day | SUBCUTANEOUS | Status: DC

## 2011-11-19 NOTE — Progress Notes (Signed)
PT Cancellation Note  Treatment cancelled today due to patient's refusal to participate. Planning to d/c this afternoon. Requesting to save up his energy to go home. Denies any further concerns.   [facility] HELEN 11/19/2011, 12:06 PM Pager: [phone]

## 2011-11-19 NOTE — Discharge Summary (Addendum)
Physician Discharge Summary  Jeffery Gibson ZOX:[id number] DOB: 1929/02/22 DOA: 11/07/2011  PCP: Cassell Clement, MD  Admit date: 11/07/2011 Discharge date: 11/19/2011  Discharge Diagnoses:  Active Problems:  Rectus sheath hematoma  Acute blood loss anemia  Hypotension  Coagulopathy  Diabetes mellitus  CAD (coronary artery disease)  HTN (hypertension)  Paroxysmal a-fib  UTI (lower urinary tract infection)  AKI (acute kidney injury)   Discharge Condition: Stable for discharge  Disposition:  Follow-up Information    Follow up with Norma Fredrickson, NP on 11/21/2011. (11am )    Contact information:   [address] [place] [address] [phone]          Diet: Low-sodium diet  History of present illness:  78 year old male w/ h/o prostate cancer and chronic AF, reports scheduled for procedure on prostate at [facility] which was cancelled because he had developed a pneumonia. He had his coumadin stopped and was bridging with lovenox. He had been rescheduled to have procedure, but was not getting better so he was started back on his coumadin 6/18. He is also apparently still getting lovenox. The am of 6/19, he was up to the bathroom when he felt weak and dizzy. Denies LOC, but stated he felt like he was going to pass out. His wife helped him back to bed and EMS was called. They report he was pale on arrival but BP and HR were normal. Denied any CP, SOB, vomiting, diarrhea, blood in stool or fever. Did report significant left sided abd pain. During evaluation was found to have some mild orthostasis, w/ staff noting his SBP drifting down w/ postural changes. He had a CT of abd/pelvis to eval pain. This showed a left rectus sheath hematoma. He was to be admitted for reversal of anticoagulation and monitoring by the IM service. Due to his hypotension the PCCM team was asked to admit to the ICU.    Hospital Course:  Rectus sheath hematoma (11/07/2011)/Acute  hemorrhagic shock secondary to left rectus sheath hematoma/Acute blood loss anemia (11/07/2011): He was admitted by the ICU team was started on IV fluids, giving FFP and 2 units of packed red blood cells. His Coumadin was stopped. He had a hematoma apparent umbilical pain. He was staying off Coumadin for the next 2 weeks. Any this will be resumed by his cardiologist as an outpatient.   Oliguric ATN/ARF in setting of CKD III: Secondary to hemorrhagic shock/ARB and NSAID's on admission, baseline creatinine is around 2. Afterward he was aggressively resuscitated, renal was consulted they started on IV Lasix aggressively he diuresis well. His creatinine peaked at 5.32 with diureses it has trended down today to 3.6. His ARB was also stopped and this will be resumed 2 weeks after his creatinine has returned to baseline. His hydrochlorothiazide was also stopped, and he has been instructed to avoid NSAIDs. As this might have contributed to his acute renal failure. He will continue on Lasix 80 mg twice a day at home will follow up with Dr. Haynes Dage and home health will check a be met and orthostatics as an outpatient and sent to Dr. Yevonne Pax office.  Coagulopathy (11/07/2011)  this was DC'd secondary to his rectus hematoma bleed. This will be resumed by his cardiologist as an outpatient.  Paroxysmal a-fib (11/09/2011)  -Rate control-Continue amiodarone and lopressor for rate control- No anticoagulation see above.   Diabetes mellitus/hypoglycemic events:  -oral meds were stopped, he was started on Lantus 15 units.. Will discontinue carb modified  diet for now  and also diet. As his creatinine improves. His dose of insulin requirement will increase. As his baseline creatinine is 2, I would recommend continuing him on any oral hypoglycemic agents and would favor insulin. He will check CBGs a.c. and at bedtime take this to his primary care doctor and his primary care Dr. will adjust his insulin regimen as tolerated to     Volume overload:  Responding well to IV diuresis. lung are clear. This is most likely secondary to aggressive fluid resuscitation.  Pseudomonas UTI:  -Current abx tx should cover adequately based on sensitivities , he'll continue in and out catheter. He completed a course of antibiotics in the hospital.  Hx of prostate ca  Requires chronic I/O cath at home, d'c foley in and out cath.   Discharge Exam: Filed Vitals:   11/19/11 0441  BP: 137/71  Pulse: 65  Temp: 97.9 F (36.6 C)  Resp: 18   Filed Vitals:   11/18/11 1510 11/18/11 2114 11/19/11 0416 11/19/11 0441  BP: 120/65 145/77  137/71  Pulse: 66 67  65  Temp: 97.6 F (36.4 C) 98 F (36.7 C)  97.9 F (36.6 C)  TempSrc: Oral Oral  Oral  Resp: 18 18  18   Height:      Weight:   118.8 kg (261 lb 14.5 oz)   SpO2: 95% 97%  96%   General: Awake alert oriented x3  Cardiovascular:irregular rate and rhythm with a positive S1-S2  Respiratory: Good air movement clear to auscultation   Discharge Instructions  Discharge Orders    Future Appointments: Provider: Department: Dept Phone: Center:   11/21/2011 11:00 AM Rosalio Macadamia, NP [facility] [phone] None   12/13/2011 1:00 PM Radene Gunning [facility] [phone] None   12/13/2011 1:30 PM Victorino December, MD [facility] [phone] None   01/10/2012 9:00 AM Radene Gunning [facility] [phone] None   01/10/2012 9:15 AM [facility] [phone] None   01/23/2012 9:15 AM Cassell Clement, MD [facility] [phone] None   01/24/2012 9:10 AM [facility] [phone] [facility]   02/07/2012 9:00 AM Radene Gunning [facility] [phone] None   02/07/2012 9:15 AM [facility] [phone] None     Future Orders Please Complete By Expires   Diet - low sodium heart healthy      Increase activity slowly        Medication List  As of 11/19/2011  2:40 PM   STOP taking these  medications         AMARYL 1 MG tablet      benzonatate 100 MG capsule      cefdinir 300 MG capsule      chlorpheniramine-HYDROcodone 10-8 MG/5ML Lqcr      enoxaparin 120 MG/0.8ML injection      hydrochlorothiazide 25 MG tablet      ibuprofen 600 MG tablet      multivitamin tablet      nitroGLYCERIN 0.4 MG SL tablet      telmisartan 80 MG tablet      warfarin 5 MG tablet         TAKE these medications         amiodarone 200 MG tablet   Commonly known as: PACERONE   TAKE (1/2) TABLET DAILY.      ARANESP IJ   Inject 1 Syringe as directed See admin instructions. Every 4 weeks as needed for anemia las  given 09-2011      aspirin 81 MG tablet   Take 81 mg by mouth daily.      FINASTERIDE PO   Take 5 mg by mouth at bedtime.      furosemide 80 MG tablet   Commonly known as: LASIX   Take 1 tablet (80 mg total) by mouth 2 (two) times daily.      insulin glargine 100 UNIT/ML injection   Commonly known as: LANTUS   Inject 15 Units into the skin at bedtime.      ipratropium 17 MCG/ACT inhaler   Commonly known as: ATROVENT HFA   Inhale 2 puffs into the lungs 3 (three) times daily.      leuprolide 22.5 MG injection   Commonly known as: LUPRON   Inject 22.5 mg into the muscle See admin instructions. Pt gets injection every 6 months for prostate cancer last given april11      metoprolol 50 MG tablet   Commonly known as: LOPRESSOR   Take 50 mg by mouth 2 (two) times daily.      PRILOSEC 20 MG capsule   Generic drug: omeprazole   TAKE 1-2 CAPSULES DAILY.      rosuvastatin 10 MG tablet   Commonly known as: CRESTOR   Take 5 mg by mouth See admin instructions. 1/2 tablet Monday Wednesday and Friday and pt takes nothing  On rest of days      saccharomyces boulardii 250 MG capsule   Commonly known as: FLORASTOR   Take 1 capsule (250 mg total) by mouth 2 (two) times daily.      Tamsulosin HCl 0.4 MG Caps   Commonly known as: FLOMAX   Take 0.4 mg by mouth daily after  supper.      ursodiol 300 MG capsule   Commonly known as: ACTIGALL   Take 300 mg by mouth 2 (two) times daily.      VITAMIN C PO   Take 1 tablet by mouth daily.      Vitamin D 2000 UNITS tablet   Take 2,000 Units by mouth daily.              The results of significant diagnostics from this hospitalization (including imaging, microbiology, ancillary and laboratory) are listed below for reference.    Significant Diagnostic Studies: Ct Abdomen Pelvis Wo Contrast  11/07/2011  *RADIOLOGY REPORT*  Clinical Data: Lower abdominal pain and ecchymosis.  Concern for rectus sheath hematoma.  On Lovenox.  CT ABDOMEN AND PELVIS WITHOUT CONTRAST  Technique:  Multidetector CT imaging of the abdomen and pelvis was performed following the standard protocol without intravenous contrast.  Comparison: 06/12/2011, chest CT same date  Findings: Trace left pleural fluid or thickening is slightly increased since the prior exam.  Previously seen right lower lobe pulmonary nodule no longer identified. Presumed cardiac leads partly visualized.  Left anterolateral abdominal wall subcutaneous stranding is noted, asymmetrically more prominent than on the right, with foci of stranding and gas bilaterally indicative of injection site.  There is fusiform enlargement of the left rectus musculature with internal fluid fluid level and internal dependent hyperdensity measuring 14.0 x 13.4 x 7.8 cm.  Inferiorly, there is persistent internal fusiform enlargement of the left rectus musculature extending to the level of the symphysis pubis.  This area is not included in the measurement of the dominant fluid density presumed hematoma.  Gallstones noted without other CT evidence for acute cholecystitis. Low density renal lesions are identified, statistically most likely cysts but  not further evaluated due to their small size.  These are stable.  Unenhanced liver, adrenal glands, spleen, and pancreas are normal.  No intra-abdominal or  retroperitoneal fluid collection. No free air.  No bowel wall thickening. Scattered colonic diverticuli noted without evidence for diverticulitis. Prostatic brachytherapy seeds are noted.  Bilateral small fat containing inguinal hernias.  No acute osseous abnormality.  IMPRESSION: Extensive fusiform enlargement of the left rectus abdominus muscle compatible with hemorrhage, with focal internal hematoma with fluid fluid level as measured above.  No acute intra-abdominal or pelvic pathology. These results were called by telephone on 11/07/2011  at  11:35 a.m. to  Dr. Bernette Mayers, who verbally acknowledged these results.  Original Report Authenticated By: Harrel Lemon, M.D.   Dg Chest 2 View  11/05/2011  *RADIOLOGY REPORT*  Clinical Data: Abnormal chest radiograph  CHEST - 2 VIEW  Comparison: 11/01/2011; 03/02/2008; 02/20/2007; chest CT - 06/12/2011  Findings: Grossly unchanged enlarged cardiac silhouette and mediastinal contours with mild tortuosity within the thoracic aorta.  Thoracic aorta calcifications.  Post median sternotomy CABG.  Stable positioning of support apparatus.  Persistent left infrahilar heterogeneous possible airspace opacities.  The previously identified 9 mm nodule within the right lower lobe seen on prior chest CT is not well depicted on this examination.  The lungs are hyperinflated with flattening of bilateral hemidiaphragms.  There is chronic blunting of the right costophrenic angle without definite pleural effusion.  No pneumothorax.  Grossly unchanged bones.  IMPRESSION: Grossly unchanged left infrahilar heterogeneous air space opacities, worrisome for infection.  Given the stability of this finding as well as the known 1 cm nodule within the right lower lobe (not well depicted on this examination but seen on recent chest CT from 06/12/2011), further evaluation with chest CT is recommended.  These results will be called to the ordering clinician or representative by the Radiologist  Assistant, and communication documented in the PACS Dashboard.  Original Report Authenticated By: Waynard Reeds, M.D.   Dg Chest 2 View  11/01/2011  *RADIOLOGY REPORT*  Clinical Data: Cough.  CHEST - 2 VIEW  Comparison: Chest CT 06/12/2011.  Findings: Median sternotomy.  Right subclavian dual lead cardiac pacemaker.  CABG.  Aortic arch atherosclerosis.  Heart size mildly enlarged for projection.  There is opacity along the left heart border on the frontal view. The lateral view, this appears posterior to the heart shadow, interposed between the thoracic spine and heart.  This is suspicious for a small focus of perihilar pneumonia which may extend to the lingula.  The preliminary report was truncated and it is unclear whether pneumonia was considered.  The right lung appears clear.  Radiographic follow-up is recommended to ensure clearing and exclude an underlying lesion.  Radiographic clearing is usually observed at 6 weeks.  IMPRESSION: Patchy airspace opacity in the anterior left lower lobe with blurring of the left heart border not present on prior exam, suspicious for pneumonia.  Clinically significant discrepancy from primary report, if provided: I do not have the complete preliminary report.  The opacity dorsal to the heart shadow was described however no diagnosis was given.  Original Report Authenticated By: Andreas Newport, M.D.   Dg Abd 1 View  11/08/2011  *RADIOLOGY REPORT*  Clinical Data: Abdominal pain.  Ileus.  ABDOMEN - 1 VIEW  Comparison: 11/07/2011.  Findings: Left abdominal wall hematoma is visualized on the supine radiographs.  There is no dilation of small bowel.  No evidence of obstruction.  No gross plain film  evidence of free air.  Prostate brachytherapy seeds are present.  IMPRESSION: No evidence of obstruction or ileus.  Left abdominal wall hematoma.  Original Report Authenticated By: Andreas Newport, M.D.   Ct Chest Wo Contrast  11/07/2011  *RADIOLOGY REPORT*  Clinical Data:  Weakness, cough and shortness of breath.  Evaluate for metastatic disease.  Recent diagnosis of pneumonia.  CT CHEST WITHOUT CONTRAST  Technique:  Multidetector CT imaging of the chest was performed following the standard protocol without IV contrast.  Comparison: CT of chest without contrast 06/12/2011.  Findings:  Mediastinum: The thyroid gland is again massively enlarged and heterogeneous in appearance, particularly the left lobe which has significant substernal extension along the high left paratracheal region.  This area is heterogeneous in attenuation and has multiple internal calcifications. This is similar to remote prior study from 04/09/2007, and presumably represents a large goiter.  No pathologically enlarged mediastinal or hilar lymph nodes. Heart size is mildly enlarged. There is no significant pericardial fluid, thickening or pericardial calcification. There is atherosclerosis of the thoracic aorta, the great vessels of the mediastinum and the coronary arteries, including calcified atherosclerotic plaque in the left main, left anterior descending, ramus intermedius, left circumflex and right coronary arteries. The patient is status post median sternotomy for CABG with a LIMA to the LAD.  A right-sided subclavian pacemaker device is in place with lead tips terminating in the right atrial appendage and right ventricular apex.  The esophagus is unremarkable in appearance.  Lungs/Pleura: Previously noted 1 cm nodule in the right lower lobe has completely resolved (presumably infectious or inflammatory on the prior study).  There is a tiny amount of left-sided pleural fluid and/or thickening dependently, and there is a small amount of atelectasis in the inferior segment of the lingula and periphery of the left lower lobe.  Dependent atelectasis is also noted in the right lower lobe.  No acute consolidative airspace disease.  No definite suspicious appearing pulmonary nodules or masses are identified.   Upper Abdomen: Incidental imaging through the upper abdomen is remarkable for marked thickening of the left rectus abdominus musculature, and high attenuation fluid accumulating within this region, concerning for rectus sheath hematoma.  Numerous colonic diverticula are noted.  Partially calcified gallstones are seen layering dependently within the lumen of the gallbladder, but there are no signs to suggest acute cholecystitis at this time.  Musculoskeletal: Median sternotomy wires. There are no aggressive appearing lytic or blastic lesions noted in the visualized portions of the skeleton.  IMPRESSION:  1.  No evidence of pneumonia at this time. 2. Interval development of thickening and high attenuation in the left rectus sheath, concerning for a rectus sheath hematoma in this patient with history of atrial fibrillation on anticoagulation therapy. 3.  Previously noted right lower lobe pulmonary nodule has resolved compared to the prior study, indicative that this was of infectious or inflammatory etiology on the prior study. 4.  Trace left-sided pleural fluid and/or thickening with minimal bibasilar subsegmental atelectasis. 5. Atherosclerosis, including extensive coronary artery disease. Please note that although the presence of coronary artery calcium documents the presence of coronary artery disease, the severity of this disease and any potential stenosis cannot be assessed on this non-gated CT examination.   The patient is status post median sternotomy for CABG with a LIMA to the LAD. 6.  Cholelithiasis without findings to suggest acute cholecystitis. 7.  Colonic diverticulosis. 8.  Additional incidental findings, as detailed above.  Original Report Authenticated By: Florencia Reasons, M.D.  US Renal  11/10/2011  *RADIOLOGY REPORT*  Clinical Data: Acute superimposed upon chronic renal insufficiency.  RENAL/URINARY TRACT ULTRASOUND COMPLETE  Comparison:  Unenhanced CT abdomen and pelvis 11/07/2011,  06/10/2011.  Findings:  Right Kidney:  No hydronephrosis.  Severe diffuse cortical thinning.  Approximate 2.4 x 2.2 x 2.9 cm cyst arising from the lower pole and approximate 1.5 x 1.6 x 1.6 cm exophytic cyst arising from the mid kidney, as noted on the CT.  No solid parenchymal masses.  No shadowing calculi.  Approximate 12.5 cm in length.  Left Kidney:  No hydronephrosis.  Severe diffuse cortical thinning. Approximate to point, 1.9 x 2.2 cm cyst arising from the mid kidney, as noted on the CT.  No solid parenchymal masses.  No shadowing calculi.  Approximate 13.4 cm in length.  Bladder:  Decompressed by Foley catheter.  Other findings:  The left rectus sheath hematoma identified on recent CT was noted during imaging of the kidneys.  IMPRESSION:  1.  No evidence of obstruction involving either kidney. 2.  Severe diffuse cortical thinning bilaterally and bilateral renal cysts as noted on the recent CT.  No solid renal masses.  Original Report Authenticated By: Arnell Sieving, M.D.   Dg Chest Port 1 View  11/10/2011  *RADIOLOGY REPORT*  Clinical Data: Follow up respiratory failure and bibasilar atelectasis.  PORTABLE CHEST - 1 VIEW 11/10/2011 0620 hours:  Comparison: Portable chest x-ray yesterday.  Two-view chest x-ray 11/05/2011.  Findings: Prior sternotomy for CABG.  Cardiac silhouette moderately enlarged but stable.  Mild pulmonary venous hypertension without overt edema, improved.  Improved aeration in the lung bases, with mild atelectasis persisting.  Improved left pleural effusion, though a small to moderate sized effusion persists.  No new abnormalities.  Right subclavian dual lead transvenous pacemaker unchanged.  IMPRESSION: Improved aeration in the lung bases, with mild atelectasis persisting.  Improving left pleural effusion, though a small to moderate sized effusion persists.  Stable cardiomegaly without edema.  No new abnormalities.  Original Report Authenticated By: Arnell Sieving, M.D.    Dg Chest Port 1 View  11/09/2011  *RADIOLOGY REPORT*  Clinical Data: Respiratory failure.  PORTABLE CHEST - 1 VIEW  Comparison: Chest x-ray 11/05/2011.  Findings: Lung volumes are very low.  There are extensive bibasilar opacities, favored to predominately reflect subsegmental atelectasis, however, sequelae of aspiration or developing infection is difficult to entirely exclude.  Blunting of the left costophrenic sulcus is compatible with a small left-sided pleural effusion.  Severe pulmonary venous congestion, likely accentuated by patient's low lung volumes.  There could be some very mild interstitial pulmonary edema at this time, however, this is not favored (interstitial prominence is favored to be related to low lung volumes and underpenetration of the film).  Heart size is mildly enlarged (unchanged). The patient is rotated to the left on today's exam, resulting in distortion of the mediastinal contours and reduced diagnostic sensitivity and specificity for mediastinal pathology.  Atherosclerotic calcifications within the arch of the aorta.  Status post median sternotomy for CABG.  Right-sided pacemaker device again noted, but film is too under penetrated to allow an accurate assessment of the leads.  IMPRESSION: 1.  Low volume chest with probable bibasilar subsegmental atelectasis and a small left-sided pleural effusion. 2.  Severe pulmonary venous congestion without frank pulmonary edema. 3.  Mild cardiomegaly is unchanged. 4.  Atherosclerosis. 5.  Postoperative changes and support apparatus, as above.  Original Report Authenticated By: Florencia Reasons, M.D.    Microbiology:  Recent Results (from the past 240 hour(s))  MRSA PCR SCREENING     Status: Normal   Collection Time   11/10/11  7:45 PM      Component Value Range Status Comment   MRSA by PCR NEGATIVE  NEGATIVE Final      Labs: Basic Metabolic Panel:  Lab 11/19/11 1610 11/18/11 0920 11/17/11 0727 11/16/11 0703 11/15/11 0625 11/14/11  0405  NA 135 134* 132* 130* 129* --  K 3.7 3.8 -- -- -- --  CL 92* 90* 89* 86* 86* --  CO2 32 31 29 31 29  --  GLUCOSE 133* 203* 131* 127* 152* --  BUN 84* 85* 86* 87* 85* --  CREATININE 3.65* 3.79* 3.91* 4.08* 4.25* --  CALCIUM 9.3 9.4 9.3 9.3 8.9 --  MG -- -- -- -- -- 1.8  PHOS 3.6 3.3 3.6 4.3 4.5 --   Liver Function Tests:  Lab 11/19/11 0555 11/18/11 0920 11/17/11 0727 11/16/11 0703 11/15/11 0625  AST -- -- -- -- --  ALT -- -- -- -- --  ALKPHOS -- -- -- -- --  BILITOT -- -- -- -- --  PROT -- -- -- -- --  ALBUMIN 3.0* 3.1* 3.0* 2.9* 2.8*   No results found for this basename: LIPASE:5,AMYLASE:5 in the last 168 hours No results found for this basename: AMMONIA:5 in the last 168 hours CBC:  Lab 11/19/11 0555 11/18/11 0920 11/17/11 0727 11/16/11 0700 11/15/11 0625  WBC 8.7 9.7 10.0 10.2 10.0  NEUTROABS -- -- -- -- --  HGB 9.3* 9.4* 8.9* 8.6* 8.3*  HCT 27.7* 28.5* 27.1* 25.5* 24.2*  MCV 89.6 89.3 87.4 86.4 84.9  PLT 280 266 285 277 271   Cardiac Enzymes: No results found for this basename: CKTOTAL:5,CKMB:5,CKMBINDEX:5,TROPONINI:5 in the last 168 hours BNP: No components found with this basename: POCBNP:5 CBG:  Lab 11/19/11 1218 11/19/11 0757 11/18/11 2112 11/18/11 1714 11/18/11 0739  GLUCAP 141* 133* 173* 164* 110*    Time coordinating discharge: Greater than 45 minutes  Signed:  Marinda Elk  [facility] 11/19/2011, 2:40 PM

## 2011-11-20 ENCOUNTER — Encounter: Payer: Self-pay | Admitting: Cardiology

## 2011-11-20 ENCOUNTER — Telehealth: Payer: Self-pay | Admitting: Cardiology

## 2011-11-20 DIAGNOSIS — I2510 Atherosclerotic heart disease of native coronary artery without angina pectoris: Secondary | ICD-10-CM

## 2011-11-20 NOTE — Telephone Encounter (Signed)
Left message to call back  

## 2011-11-20 NOTE — Telephone Encounter (Signed)
New Problem:    Called because the patient has not had any potassium ordered and she was wondering if Dr. Patty Sermons would like the patient to take potassium.  Please call back.

## 2011-11-20 NOTE — Progress Notes (Signed)
11/19/11 NSG 1630 D/C note Patient discharged to home with family, discharged instructions given and reviewed with patient.  Patient verbalized understanding, care notes given for new meds and pertinent education. Skin intact at discharge except for bruises to back & buttock, scab to left arm, IV discharged and intact. Patient escorted to car via wheelchair by NT.  Forbes Cellar, RN

## 2011-11-21 ENCOUNTER — Telehealth: Payer: Self-pay | Admitting: Cardiology

## 2011-11-21 ENCOUNTER — Ambulatory Visit: Payer: Medicare Other | Admitting: Nurse Practitioner

## 2011-11-21 MED ORDER — POTASSIUM CHLORIDE CRYS ER 20 MEQ PO TBCR: 20.0000 meq | EXTENDED_RELEASE_TABLET | Freq: Every day | ORAL | Status: DC

## 2011-11-21 MED ORDER — FUROSEMIDE 80 MG PO TABS: 80.0000 mg | ORAL_TABLET | Freq: Two times a day (BID) | ORAL | Status: DC

## 2011-11-21 NOTE — Telephone Encounter (Signed)
Increase Lantus insulin to 18 units each day.  He may use Imodium for diarrhea.

## 2011-11-21 NOTE — Telephone Encounter (Signed)
Called and requested labs, home health nurse never called back (wife told me labs done)  Labs from home health reviewed by  Dr. Patty Sermons, will add Kdur 20 meq daily, advised wife.  Continue Lasix 80 mg daily

## 2011-11-21 NOTE — Telephone Encounter (Signed)
Spoke with wife and patients blood sugar this am before breakfast was 185 and before lunch it was 282. Yesterday morning blood sugar was 139.  Woke up this morning and had some diarrhea so they cancelled office visit for today.  Wife is just unsure what to do with these blood sugars being this high. Will forward to  Dr. Patty Sermons for review

## 2011-11-21 NOTE — Telephone Encounter (Signed)
Advised wife  Labs from home health reviewed by  Dr. Patty Sermons, will add Kdur 20 meq daily, advised wife.  Continue Lasix 80 mg daily

## 2011-11-21 NOTE — Telephone Encounter (Signed)
Pt blood sugar is elevated and wants to know if he can eat or not due to it being so high and how much insulin to give

## 2011-11-22 ENCOUNTER — Encounter: Payer: Self-pay | Admitting: Cardiology

## 2011-11-23 ENCOUNTER — Telehealth: Payer: Self-pay | Admitting: Cardiology

## 2011-11-23 NOTE — Telephone Encounter (Signed)
Call received from Bobette Mo with [facility] [phone]). She was calling to report the patient's orthostatic readings from yesterday. She states the patient was semi- reclined with the first reading of 128/60 HR- 74, standing he was 116/54 HR- 72. They did not check any other standing readings and wanted to know should they do that. I explained that it may be helpful since he already dropped 12 points with his systolic reading and to make sure he does not fall any further. They are scheduled to see him back on Tuesday next week and wanted to know should he have another BMP sooner than that. I explained Dr. Patty Sermons and Juliette Alcide are out of the office today, but I will forward the message to them.

## 2011-11-23 NOTE — Telephone Encounter (Signed)
Wife given lab results/ bun creat elevated while in hospital and values continue to be elevated, made app with dr Patty Sermons 7/12, tried to get in with np/pa but declined told to call back if wants to be seen sooner.

## 2011-11-23 NOTE — Telephone Encounter (Signed)
New problem:  Patient wife calling regarding test results.  

## 2011-11-26 NOTE — Telephone Encounter (Signed)
Get BMET at next OV.

## 2011-11-27 ENCOUNTER — Telehealth: Payer: Self-pay | Admitting: Cardiology

## 2011-11-27 NOTE — Telephone Encounter (Signed)
New Problem:    Called in returning your call about the patient's orthostatic blood pressures lying:132/74 HR:76, sitting: 122/72 HR:72, standing: 108/80 HR: 76, recovery: 132/74. Will see him again on Thurs. Please call back.

## 2011-11-27 NOTE — Telephone Encounter (Signed)
Continue present meds and avoid standing up too quickly.

## 2011-11-27 NOTE — Telephone Encounter (Signed)
Will forward to  Dr. Patty Sermons for review.  Patient has appointment on 7/12

## 2011-11-27 NOTE — Telephone Encounter (Signed)
Left message with Tresa Endo will check at office visit next week

## 2011-11-30 ENCOUNTER — Ambulatory Visit (INDEPENDENT_AMBULATORY_CARE_PROVIDER_SITE_OTHER): Payer: Medicare Other | Admitting: Cardiology

## 2011-11-30 ENCOUNTER — Encounter: Payer: Self-pay | Admitting: Cardiology

## 2011-11-30 VITALS — BP 118/60 | HR 70 | Ht 68.0 in | Wt 234.0 lb

## 2011-11-30 DIAGNOSIS — I495 Sick sinus syndrome: Secondary | ICD-10-CM

## 2011-11-30 DIAGNOSIS — C61 Malignant neoplasm of prostate: Secondary | ICD-10-CM

## 2011-11-30 DIAGNOSIS — E119 Type 2 diabetes mellitus without complications: Secondary | ICD-10-CM

## 2011-11-30 DIAGNOSIS — N184 Chronic kidney disease, stage 4 (severe): Secondary | ICD-10-CM

## 2011-11-30 DIAGNOSIS — N179 Acute kidney failure, unspecified: Secondary | ICD-10-CM

## 2011-11-30 LAB — CBC WITH DIFFERENTIAL/PLATELET
Basophils Absolute: 0 10*3/uL (ref 0.0–0.1)
Basophils Relative: 0 % (ref 0–1)
Eosinophils Relative: 2 % (ref 0–5)
HCT: 32.2 % — ABNORMAL LOW (ref 39.0–52.0)
Lymphocytes Relative: 9 % — ABNORMAL LOW (ref 12–46)
MCHC: 34.2 g/dL (ref 30.0–36.0)
MCV: 88 fL (ref 78.0–100.0)
Monocytes Absolute: 0.8 10*3/uL (ref 0.1–1.0)
Platelets: 183 10*3/uL (ref 150–400)
RDW: 18.2 % — ABNORMAL HIGH (ref 11.5–15.5)
WBC: 5.5 10*3/uL (ref 4.0–10.5)

## 2011-11-30 LAB — BASIC METABOLIC PANEL
BUN: 59 mg/dL — ABNORMAL HIGH (ref 6–23)
Calcium: 9.4 mg/dL (ref 8.4–10.5)
Chloride: 93 mEq/L — ABNORMAL LOW (ref 96–112)
Creat: 3.06 mg/dL — ABNORMAL HIGH (ref 0.50–1.35)

## 2011-11-30 MED ORDER — INSULIN GLARGINE 100 UNIT/ML ~~LOC~~ SOLN: 22.0000 [IU] | Freq: Every day | SUBCUTANEOUS | Status: DC

## 2011-11-30 NOTE — Assessment & Plan Note (Signed)
His last serum creatinine on 11/19/11 was 3.65 and he has class IV chronic kidney disease.  We are rechecking labs today.  The patient is presently on Lasix 80 mg twice a day and he appears to be euvolemic and depending on today's results we may be able to cut back on his Lasix to allow better renal perfusion.  He is not having any symptoms of CHF at the present time

## 2011-11-30 NOTE — Progress Notes (Signed)
Jeffery Gibson Date of Birth:  1928-06-03 [facility] [address] [place], [place]  [address] [phone]         Fax   [phone]  History of Present Illness: This pleasant 78 year old gentleman is seen for a scheduled followup post hospital office visit.  He has a complex past medical history.  He has a history of progressive prostate cancer and is followed at [facility].  He had been scheduled for orchiectomy for his prostate cancer but the surgery had to be postponed because of complications arising during the preoperative period.  Patient had been on long-term Coumadin for paroxysmal atrial fibrillation.  He was undergoing Lovenox bridging prior to surgery he developed a large hematoma of the rectus sheath.  He had to be admitted to the hospital from 6/19 /13 through 11/19/11.  During that period of time he had worsening renal insufficiency and at the time of discharge his creatinine was 3.65 and his BUN was 84 he was taken off of his ARB was also taken off Coumadin.  In the hospital his diabetes was poorly controlled and he was switched to insulin which has been continued on an outpatient basis.  He presently is taking Lantus 18 units daily and his blood sugars are still running between 150 and 202.  We will increase his Lantus to 22 units each night instead of 18.  He is not having any hypoglycemic episodes.  Current Outpatient Prescriptions  Medication Sig Dispense Refill  . amiodarone (PACERONE) 200 MG tablet TAKE (1/2) TABLET DAILY.  45 tablet  4  . Ascorbic Acid (VITAMIN C PO) Take 1 tablet by mouth daily.        Marland Kitchen aspirin 81 MG tablet Take 81 mg by mouth daily.        . Cholecalciferol (VITAMIN D) 2000 UNITS tablet Take 2,000 Units by mouth daily.        . Darbepoetin Alfa-Albumin (ARANESP IJ) Inject 1 Syringe as directed See admin instructions. Every 4 weeks as needed for anemia las given 09-2011      . FINASTERIDE PO Take 5 mg by mouth at bedtime.       .  furosemide (LASIX) 80 MG tablet Take 1 tablet (80 mg total) by mouth 2 (two) times daily.  60 tablet  5  . ipratropium (ATROVENT HFA) 17 MCG/ACT inhaler Inhale 2 puffs into the lungs 3 (three) times daily. As needed      . leuprolide (LUPRON) 22.5 MG injection Inject 22.5 mg into the muscle See admin instructions. Pt gets injection every 6 months for prostate cancer last given april11      . metoprolol (LOPRESSOR) 50 MG tablet Take 50 mg by mouth 2 (two) times daily.      . potassium chloride SA (K-DUR,KLOR-CON) 20 MEQ tablet Take 1 tablet (20 mEq total) by mouth daily.  30 tablet  5  . PRILOSEC 20 MG capsule TAKE 1-2 CAPSULES DAILY.  180 each  3  . rosuvastatin (CRESTOR) 10 MG tablet Take 5 mg by mouth See admin instructions. 1/2 tablet Monday Wednesday and Friday and pt takes nothing  On rest of days      . Tamsulosin HCl (FLOMAX) 0.4 MG CAPS Take 0.4 mg by mouth daily after supper.       . ursodiol (ACTIGALL) 300 MG capsule Take 300 mg by mouth 2 (two) times daily.       Marland Kitchen DISCONTD: insulin glargine (LANTUS) 100 UNIT/ML injection Inject 20 Units into  the skin at bedtime.       . insulin glargine (LANTUS SOLOSTAR) 100 UNIT/ML injection Inject 22 Units into the skin at bedtime.  10 mL  prn    Allergies  Allergen Reactions  . Bextra (Valdecoxib) Diarrhea  . Latex Hives  . Lovenox (Enoxaparin Sodium)     Gi bleed  . Metformin And Related Nausea Only  . Mevacor (Lovastatin) Nausea And Vomiting  . Ramipril Cough  . Warfarin And Related     Gi bleed    Patient Active Problem List  Diagnosis  . PROSTATE CANCER  . ADENOMATOUS COLONIC POLYP  . DM  . HYPERLIPIDEMIA  . HYPERTENSION  . MYOCARDIAL INFARCTION  . CAD  . Atrial fibrillation  . BRADYCARDIA-TACHYCARDIA SYNDROME  . SUPRAVENTRICULAR TACHYCARDIA  . RENAL INSUFFICIENCY  . SYNCOPE  . Diabetes mellitus  . Anemia associated with chronic renal failure  . Dyspnea  . Rectus sheath hematoma  . Acute blood loss anemia  . Hypotension   . Coagulopathy  . Diabetes mellitus  . CAD (coronary artery disease)  . HTN (hypertension)  . Paroxysmal a-fib  . UTI (lower urinary tract infection)  . AKI (acute kidney injury)    History  Smoking status  . Former Smoker  . Types: Cigarettes  . Quit date: 05/22/1963  Smokeless tobacco  . Never Used    History  Alcohol Use No    Family History  Problem Relation Age of Onset  . Heart failure Father 57  . Gallbladder disease Father   . COPD    . Coronary artery disease    . Emphysema      Review of Systems: Constitutional: no fever chills diaphoresis or fatigue or change in weight.  Head and neck: no hearing loss, no epistaxis, no photophobia or visual disturbance. Respiratory: No cough, shortness of breath or wheezing. Cardiovascular: No chest pain peripheral edema, palpitations. Gastrointestinal: No abdominal distention, no abdominal pain, no change in bowel habits hematochezia or melena. Genitourinary: No dysuria, no frequency, no urgency, no nocturia. Musculoskeletal:No arthralgias, no back pain, no gait disturbance or myalgias. Neurological: No dizziness, no headaches, no numbness, no seizures, no syncope, no weakness, no tremors. Hematologic: No lymphadenopathy, no easy bruising. Psychiatric: No confusion, no hallucinations, no sleep disturbance.    Physical Exam: Filed Vitals:   11/30/11 1433  BP: 118/60  Pulse: 70   general appearance reveals a well-developed well-nourished gentleman in no distress.  He travels by wheelchair because of leg weakness.Pupils equal and reactive.   Extraocular Movements are full.  There is no scleral icterus.  The mouth and pharynx are normal.  The neck is supple.  The carotids reveal no bruits.  The jugular venous pressure is normal.  The thyroid is not enlarged.  There is no lymphadenopathy.  The chest is clear to percussion and auscultation. There are no rales or rhonchi. Expansion of the chest is symmetrical.  Heart  rhythm is regular.  No murmur gallop or rub.  Abdomen is soft but distended and obese.  There does not appear to be any ascites.  Extremities show trace ankle edema.   Assessment / Plan: Disposition we're checking a CBC and a basal metabolic panel today.  We are not resuming his warfarin for the foreseeable future.  We want him to have his orchiectomy in about a month if stable.  We are adjusting his Lantus dose upward.  The patient will return in 3 weeks for a followup office visit EKG basal metabolic panel and  CBC.  Of note is the fact that the patient's weight is down 9 pounds since we last saw him on 10/22/11

## 2011-11-30 NOTE — Assessment & Plan Note (Signed)
The patient has a functioning pacemaker.  He is presently maintaining normal sinus rhythm clinically.  We did not do an EKG today.  He is off Coumadin and on a baby aspirin daily

## 2011-11-30 NOTE — Patient Instructions (Signed)
Will obtain labs today and call you with the results  Increase Lantus to 22 units at night  Your physician recommends that you schedule a follow-up appointment in: 3 weeks ov/bmet//cbc/ekg

## 2011-11-30 NOTE — Assessment & Plan Note (Addendum)
The patient still needs to have the orchiectomy to slow down the progression of his prostate cancer.  He will call [facility] to reschedule the surgery probably in about a month or so.

## 2011-11-30 NOTE — Assessment & Plan Note (Signed)
The home health nurses have been helping him with his diabetes.  We are switching him to a Lantus pen at his request and we are increasing his dose to 22 units nightly

## 2011-12-05 ENCOUNTER — Telehealth: Payer: Self-pay | Admitting: Cardiology

## 2011-12-05 NOTE — Telephone Encounter (Signed)
New msg Pt's wife wants to know test results. Please call

## 2011-12-05 NOTE — Telephone Encounter (Signed)
Labs given to wife. Left message for Tresa Endo RN with [facility] to called (patient needs BMET Tuesday)

## 2011-12-05 NOTE — Telephone Encounter (Signed)
Patient wife calling back to discuss lab results w/ Nurse MP. She was cut off when phone lines went down, she can be reached at hm#

## 2011-12-05 NOTE — Telephone Encounter (Signed)
Discussed with Dawayne Patricia NP and will have patient restrict free water to 1500 cc a day and recheck next week.  Advised wife

## 2011-12-07 NOTE — Telephone Encounter (Signed)
Ok to extend care, will get labs Tuesday, and will have [facility] resend recent labs.  7/4 labs given verbally Sodium 132, chloride 91, BUN 69, and Cr. 3.38.  Labs have been checked in office since then.  Will forward to  Dr. Patty Sermons for review

## 2011-12-09 NOTE — Telephone Encounter (Signed)
Serum sodium has improved slightly.  Continue fluid restriction and same meds.  Recheck labs Tuesday as noted.

## 2011-12-11 ENCOUNTER — Encounter: Payer: Self-pay | Admitting: Cardiology

## 2011-12-11 ENCOUNTER — Telehealth: Payer: Self-pay | Admitting: *Deleted

## 2011-12-11 NOTE — Telephone Encounter (Signed)
Tresa Endo, [facility], calls today b/c pt continues to have dizziness and feels it is worse. He has also had diarrhea this past weekend & 3 loose stools yesterday. His wife held his 2nd dose of lasix yesterday b/c of this. Pt has been trying to drink 6 glasses of water daily. Weight today: 229 Tresa Endo states there is no edema and pt is not short of breath. She is concerned also that BMP today shows a Na+ level of 128  I called and spoke with Dr. Patty Sermons regarding this.  Per Dr. Patty Sermons : Decrease Lasix to 40mg  qd for now with a repeat BMP in 1 week (drawn by [facility])  Tresa Endo is aware and will convey information to pt wife. Will recheck BMP in 1 week. Mylo Red RN

## 2011-12-12 ENCOUNTER — Telehealth: Payer: Self-pay | Admitting: *Deleted

## 2011-12-12 NOTE — Telephone Encounter (Signed)
Wife called and she is concerned about potassium patient is taking.  Lasix yesterday decreased to to 40 mg daily and take K+ 20 meq daily.  Potassium yesterday was 4.5. Will forward to  Dr. Patty Sermons for review

## 2011-12-12 NOTE — Telephone Encounter (Signed)
Decrease K to 20 QOD

## 2011-12-12 NOTE — Telephone Encounter (Signed)
Advised wife of change  

## 2011-12-13 ENCOUNTER — Other Ambulatory Visit (HOSPITAL_BASED_OUTPATIENT_CLINIC_OR_DEPARTMENT_OTHER): Payer: Medicare Other

## 2011-12-13 ENCOUNTER — Ambulatory Visit: Payer: Medicare Other

## 2011-12-13 ENCOUNTER — Telehealth: Payer: Self-pay | Admitting: Cardiology

## 2011-12-13 ENCOUNTER — Telehealth: Payer: Self-pay | Admitting: Oncology

## 2011-12-13 ENCOUNTER — Encounter: Payer: Self-pay | Admitting: Oncology

## 2011-12-13 ENCOUNTER — Ambulatory Visit (HOSPITAL_BASED_OUTPATIENT_CLINIC_OR_DEPARTMENT_OTHER): Payer: Medicare Other | Admitting: Oncology

## 2011-12-13 VITALS — BP 118/69 | HR 79 | Temp 97.8°F | Ht 68.0 in | Wt 237.8 lb

## 2011-12-13 DIAGNOSIS — C61 Malignant neoplasm of prostate: Secondary | ICD-10-CM

## 2011-12-13 DIAGNOSIS — D631 Anemia in chronic kidney disease: Secondary | ICD-10-CM

## 2011-12-13 DIAGNOSIS — N189 Chronic kidney disease, unspecified: Secondary | ICD-10-CM

## 2011-12-13 LAB — CBC WITH DIFFERENTIAL/PLATELET
Basophils Absolute: 0 10*3/uL (ref 0.0–0.1)
Eosinophils Absolute: 0.1 10*3/uL (ref 0.0–0.5)
HGB: 11.3 g/dL — ABNORMAL LOW (ref 13.0–17.1)
MONO#: 0.7 10*3/uL (ref 0.1–0.9)
MONO%: 9.8 % (ref 0.0–14.0)
NEUT#: 5 10*3/uL (ref 1.5–6.5)
RBC: 3.76 10*6/uL — ABNORMAL LOW (ref 4.20–5.82)
RDW: 16.4 % — ABNORMAL HIGH (ref 11.0–14.6)
WBC: 6.6 10*3/uL (ref 4.0–10.3)
lymph#: 0.9 10*3/uL (ref 0.9–3.3)

## 2011-12-13 NOTE — Progress Notes (Signed)
OFFICE PROGRESS NOTE  CC: Dr. Broadus Macguire Hemal  Cassell Clement, MD [address] [place] [address]  DIAGNOSIS: 78 year old gentleman with  #1 stage II adenocarcinoma of the colon originally diagnosed in July 2005 on observation.  #2 prostate cancer status post brachii therapy is currently receiving Lupron every 6 month is followed by Dr. Luane School at [facility].   #3 Anemia secondary to renal insufficiency and chronic disease on Aranesp injections 300 mg every 3 weeks to keep her hemoglobin at or above 11 g  CURRENT THERAPY: Aranesp 300 mg every 3 week,   INTERVAL HISTORY: Jeffery Gibson 78 y.o. male returns for followup visit. Since patient's last visit with me his course has been complicated by development of bleeding that required  [facility] in [facility] intensive. He received multiple units of blood transfusions. He is now off all anticoagulation. Patient otherwise today feels well he is weak tired and fatigued. He is in physical therapy to get himself stronger. He is planning on having bilateral orchiectomies towards the end of August. Otherwise he feels quite MEDICAL HISTORY: Past Medical History  Diagnosis Date  . Ischemic heart disease 05/06/06    post CARG 05/06/06  . Atrial flutter     afib and atrial flutter (permanent)  . Tachycardia-bradycardia 1997    dual-chamber/for tachybradycardia syndrome  . Obese     exogenous  . Renal insufficiency   . Anemia of chronic disease     aranesp injections  . Dyslipidemia   . Coronary artery disease   . Anemia associated with chronic renal failure 04/05/2011  . Cancer     prostate/on Lupron  . Adenocarcinoma of colon 11/2003    stage 2(T3,N0,M0)  . Diabetes mellitus   . Hypertension     ALLERGIES:  is allergic to bextra; latex; lovenox; metformin and related; mevacor; ramipril; and warfarin and related.  MEDICATIONS:  Current Outpatient Prescriptions    Medication Sig Dispense Refill  . amiodarone (PACERONE) 200 MG tablet TAKE (1/2) TABLET DAILY.  45 tablet  4  . Ascorbic Acid (VITAMIN C PO) Take 1 tablet by mouth daily.        Marland Kitchen aspirin 81 MG tablet Take 81 mg by mouth daily.        . Cholecalciferol (VITAMIN D) 2000 UNITS tablet Take 2,000 Units by mouth daily.        . Darbepoetin Alfa-Albumin (ARANESP IJ) Inject 1 Syringe as directed See admin instructions. Every 4 weeks as needed for anemia las given 09-2011      . FINASTERIDE PO Take 5 mg by mouth at bedtime.       . furosemide (LASIX) 80 MG tablet Take 40 mg by mouth as directed. 1/2 daily      . insulin glargine (LANTUS SOLOSTAR) 100 UNIT/ML injection Inject 22 Units into the skin at bedtime.  10 mL  prn  . metoprolol (LOPRESSOR) 50 MG tablet Take 50 mg by mouth 2 (two) times daily.      . potassium chloride SA (K-DUR,KLOR-CON) 20 MEQ tablet Take 20 mEq by mouth every other day.      Marland Kitchen PRILOSEC 20 MG capsule TAKE 1-2 CAPSULES DAILY.  180 each  3  . rosuvastatin (CRESTOR) 10 MG tablet Take 5 mg by mouth See admin instructions. 1/2 tablet Monday Wednesday and Friday and pt takes nothing  On rest of days      . Tamsulosin HCl (FLOMAX) 0.4 MG CAPS  Take 0.4 mg by mouth daily after supper.       . ursodiol (ACTIGALL) 300 MG capsule Take 300 mg by mouth 2 (two) times daily.       Marland Kitchen ipratropium (ATROVENT HFA) 17 MCG/ACT inhaler Inhale 2 puffs into the lungs 3 (three) times daily. As needed      . leuprolide (LUPRON) 22.5 MG injection Inject 22.5 mg into the muscle See admin instructions. Pt gets injection every 6 months for prostate cancer last given april11        SURGICAL HISTORY:  Past Surgical History  Procedure Date  . Laparotomy 12/04/2003    resection of rectosigmoid carcinoma/  . Radioactive seed implant 05/2005    transperianeal placement I-125 for prostate cancer/# of  seeds 55  . Cardiac catheterization 05/01/06    EF 40%/diffuse 3 vessel CAD/tight L antereior descending  artery stenosis/diffuse disease proximal L anterior descending  . Coronary artery bypass graft 05/06/06    x4 with L internal mammary artery to the L anterior descending coronary artery  . Pacemaker removal 1997    REVIEW OF SYSTEMS:  A comprehensive review of systems was negative except for: Cardiovascular: positive for dyspnea and irregular heart beat Genitourinary: positive for urinary incontinence   PHYSICAL EXAMINATION: General appearance: alert, cooperative, mild distress, moderately obese and pale Neck: no adenopathy, no carotid bruit, no JVD, supple, symmetrical, trachea midline and thyroid not enlarged, symmetric, no tenderness/mass/nodules Resp: clear to auscultation bilaterally and normal percussion bilaterally Cardio: irregularly irregular rhythm GI: soft, non-tender; bowel sounds normal; no masses,  no organomegaly Extremities: extremities normal, atraumatic, no cyanosis or edema Neurologic: Alert and oriented X 3, normal strength and tone. Normal symmetric reflexes. Normal coordination and gait Sensory: normal Motor: grossly normal  ECOG PERFORMANCE STATUS: 1 - Symptomatic but completely ambulatory  Blood pressure 118/69, pulse 79, temperature 97.8 F (36.6 C), temperature source Oral, height 5\' 8"  (1.727 m), weight 237 lb 12.8 oz (107.865 kg).  LABORATORY DATA: Lab Results  Component Value Date   WBC 6.6 12/13/2011   HGB 11.3* 12/13/2011   HCT 32.6* 12/13/2011   MCV 86.7 12/13/2011   PLT 141 12/13/2011      Chemistry      Component Value Date/Time   NA 130* 11/30/2011 1557   NA 141 02/09/2010 1156   K 4.4 11/30/2011 1557   K 4.5 02/09/2010 1156   CL 93* 11/30/2011 1557   CL 98 02/09/2010 1156   CO2 25 11/30/2011 1557   CO2 25 02/09/2010 1156   BUN 59* 11/30/2011 1557   BUN 36* 02/09/2010 1156   CREATININE 3.06* 11/30/2011 1557   CREATININE 3.65* 11/19/2011 0555      Component Value Date/Time   CALCIUM 9.4 11/30/2011 1557   CALCIUM 8.6 02/09/2010 1156   ALKPHOS 73  11/07/2011 0915   ALKPHOS 77 02/09/2010 1156   AST 11 11/07/2011 0915   AST 25 02/09/2010 1156   ALT 9 11/07/2011 0915   BILITOT 0.6 11/07/2011 0915   BILITOT 0.70 02/09/2010 1156       ASSESSMENT: 78 year old gentleman with:  #1 anemia of chronic disease.  #2 prostate cancer with a rising PSA.  #3 recent GI bleed requiring extensive hospitalization.  PLAN:  #1 patient will not be receiving Aranesp injection today as his hemoglobin is above 11.  #2 patient is planning on having bilateral orchiectomies in August 2013. He will remain off of any anti-coagulation to reduce the risk of his bleeding.  #3 I will plan  on seeing him back in about one month's time for followup.  All questions were answered. The patient knows to call the clinic with any problems, questions or concerns. We can certainly see the patient much sooner if necessary.  I spent 20 minutes counseling the patient face to face. The total time spent in the appointment was 30 minutes.    Drue Second, MD Medical/Oncology [facility] [phone] (beeper) [phone] (Office)

## 2011-12-13 NOTE — Telephone Encounter (Signed)
gve the pt's wife the sept 2013 appt calendar °

## 2011-12-13 NOTE — Telephone Encounter (Signed)
New msg [facility] called and said that forms that was sent back needs initials of Dr Patty Sermons. They will send back form again

## 2011-12-13 NOTE — Telephone Encounter (Signed)
Will be looking for papers

## 2011-12-13 NOTE — Patient Instructions (Addendum)
1. No injection today.  2. i will see you back in 1 month

## 2011-12-14 ENCOUNTER — Telehealth: Payer: Self-pay | Admitting: Cardiology

## 2011-12-14 NOTE — Telephone Encounter (Signed)
Advised wife  

## 2011-12-14 NOTE — Telephone Encounter (Signed)
Surgery August 30 th not 20 th, is this ok to wait to resume?

## 2011-12-14 NOTE — Telephone Encounter (Signed)
Stop Coumadin 4 days before procedure.  No Lovenox bridging indicated this time

## 2011-12-14 NOTE — Telephone Encounter (Signed)
Patient NOT taking coumadin at all at this time.  Will forward to  Dr. Patty Sermons to make him aware of this

## 2011-12-14 NOTE — Telephone Encounter (Signed)
Yes plan was to not restart warfarin for a least a month. So no  warfarin leading up to surgery.

## 2011-12-14 NOTE — Telephone Encounter (Signed)
New problem:  Patient wife calling surgery on 8/20. Please advise on coumadin.

## 2011-12-14 NOTE — Telephone Encounter (Signed)
Will forward to  Dr. Brackbill for review 

## 2011-12-14 NOTE — Telephone Encounter (Signed)
OK to wait that long.

## 2011-12-18 ENCOUNTER — Encounter: Payer: Self-pay | Admitting: Cardiology

## 2011-12-19 ENCOUNTER — Telehealth: Payer: Self-pay | Admitting: Cardiology

## 2011-12-19 NOTE — Telephone Encounter (Signed)
New msg [facility] called and said they need some more info on paperwork that was sent back

## 2011-12-19 NOTE — Telephone Encounter (Signed)
Will refax information

## 2011-12-20 ENCOUNTER — Telehealth: Payer: Self-pay | Admitting: Cardiology

## 2011-12-20 HISTORY — PX: ORCHIECTOMY: SHX2116

## 2011-12-20 NOTE — Telephone Encounter (Signed)
Tomma Lightning- [facility] [phone]  Please return call to [facility] regarding order to continue PT 2x per week for the next 2 wks

## 2011-12-20 NOTE — Telephone Encounter (Signed)
Left message ok to continue therapy as requested

## 2011-12-26 ENCOUNTER — Ambulatory Visit: Payer: Medicare Other | Admitting: Oncology

## 2011-12-28 ENCOUNTER — Encounter: Payer: Self-pay | Admitting: Cardiology

## 2011-12-28 ENCOUNTER — Other Ambulatory Visit (INDEPENDENT_AMBULATORY_CARE_PROVIDER_SITE_OTHER): Payer: Medicare Other

## 2011-12-28 ENCOUNTER — Ambulatory Visit (INDEPENDENT_AMBULATORY_CARE_PROVIDER_SITE_OTHER): Payer: Medicare Other | Admitting: Cardiology

## 2011-12-28 VITALS — BP 120/78 | HR 84 | Ht 67.0 in | Wt 237.0 lb

## 2011-12-28 DIAGNOSIS — N390 Urinary tract infection, site not specified: Secondary | ICD-10-CM

## 2011-12-28 DIAGNOSIS — I4891 Unspecified atrial fibrillation: Secondary | ICD-10-CM

## 2011-12-28 DIAGNOSIS — N179 Acute kidney failure, unspecified: Secondary | ICD-10-CM

## 2011-12-28 LAB — CBC WITH DIFFERENTIAL/PLATELET
Basophils Absolute: 0 10*3/uL (ref 0.0–0.1)
Eosinophils Absolute: 0.1 10*3/uL (ref 0.0–0.7)
Eosinophils Relative: 1 % (ref 0–5)
MCH: 31 pg (ref 26.0–34.0)
MCV: 89.4 fL (ref 78.0–100.0)
Neutrophils Relative %: 82 % — ABNORMAL HIGH (ref 43–77)
Platelets: 164 10*3/uL (ref 150–400)
RDW: 17 % — ABNORMAL HIGH (ref 11.5–15.5)
WBC: 7 10*3/uL (ref 4.0–10.5)

## 2011-12-28 LAB — BASIC METABOLIC PANEL
BUN: 47 mg/dL — ABNORMAL HIGH (ref 6–23)
Creat: 2.88 mg/dL — ABNORMAL HIGH (ref 0.50–1.35)
Glucose, Bld: 149 mg/dL — ABNORMAL HIGH (ref 70–99)
Potassium: 4.8 mEq/L (ref 3.5–5.3)

## 2011-12-28 MED ORDER — AMOXICILLIN 500 MG PO CAPS: 500.0000 mg | ORAL_CAPSULE | Freq: Three times a day (TID) | ORAL | Status: AC

## 2011-12-28 NOTE — Patient Instructions (Addendum)
Will obtain a urinalysis and call with the results  Rx will be sent to [facility] for Amoxil and Freestyle glucometer  Your physician recommends that you schedule a follow-up appointment in: 2 month ov/bmet

## 2011-12-28 NOTE — Assessment & Plan Note (Signed)
EKG today does confirm that he is in atrial flutter with a controlled ventricular response.  He has an underlying ventricular pacemaker.  He is not presently on Coumadin because of his recent bleed.  He is getting ready to have orchiectomy at the end of the month.  Following the orchiectomy we will plan to restart his Coumadin.  For now we will continue daily aspirin

## 2011-12-28 NOTE — Progress Notes (Signed)
Jeffery Gibson Date of Birth:  Nov 03, 1928 [facility] [address] [place], [place]  [address] [phone]         Fax   [phone]  History of Present Illness: This pleasant 78 year old gentleman is seen for a scheduled followup office visit.  He has a complex past medical history.  He has known ischemic heart disease.  He is diabetic.  He has a history of tachybradycardia syndrome and paroxysmal atrial fibrillation.  He was recently admitted with complications of bleeding following Lovenox injections.  He has renal insufficiency which is improving slowly.  He has a malignant neoplasm of the prostate with metastases and is scheduled to undergo orchiectomy at [facility] on August 30.  He has developed another bladder infection recently and has been having frequency and urgency.  He has difficulty with emptying his bladder and self catheterizes himself twice a day since we last saw him his strength and endurance had improved and he has been getting physical therapy at home Current Outpatient Prescriptions  Medication Sig Dispense Refill  . amiodarone (PACERONE) 200 MG tablet TAKE (1/2) TABLET DAILY.  45 tablet  4  . Ascorbic Acid (VITAMIN C PO) Take 1 tablet by mouth daily.        Marland Kitchen aspirin 81 MG tablet Take 81 mg by mouth daily.        . Cholecalciferol (VITAMIN D) 2000 UNITS tablet Take 2,000 Units by mouth daily.        . Darbepoetin Alfa-Albumin (ARANESP IJ) Inject 1 Syringe as directed See admin instructions. Every 4 weeks as needed for anemia las given 09-2011      . FINASTERIDE PO Take 5 mg by mouth at bedtime.       . furosemide (LASIX) 80 MG tablet Take 40 mg by mouth daily.       . insulin glargine (LANTUS SOLOSTAR) 100 UNIT/ML injection Inject 22 Units into the skin at bedtime.  10 mL  prn  . metoprolol (LOPRESSOR) 50 MG tablet Take 50 mg by mouth 2 (two) times daily.      . potassium chloride SA (K-DUR,KLOR-CON) 20 MEQ tablet Take 20 mEq by mouth  every other day.      Marland Kitchen PRILOSEC 20 MG capsule TAKE 1-2 CAPSULES DAILY.  180 each  3  . rosuvastatin (CRESTOR) 10 MG tablet Take 5 mg by mouth See admin instructions. 1/2 tablet Monday Wednesday and Friday and pt takes nothing  On rest of days      . Tamsulosin HCl (FLOMAX) 0.4 MG CAPS Take 0.4 mg by mouth daily after supper.       . ursodiol (ACTIGALL) 300 MG capsule Take 300 mg by mouth 2 (two) times daily.       Marland Kitchen amoxicillin (AMOXIL) 500 MG capsule Take 1 capsule (500 mg total) by mouth 3 (three) times daily.  20 capsule  0    Allergies  Allergen Reactions  . Bextra (Valdecoxib) Diarrhea  . Latex Hives  . Lovenox (Enoxaparin Sodium)     Gi bleed  . Lupron (Leuprolide Acetate)     Kidney failure  . Metformin And Related Nausea Only  . Mevacor (Lovastatin) Nausea And Vomiting  . Ramipril Cough  . Warfarin And Related     Gi bleed    Patient Active Problem List  Diagnosis  . PROSTATE CANCER  . ADENOMATOUS COLONIC POLYP  . DM  . HYPERLIPIDEMIA  . HYPERTENSION  . MYOCARDIAL INFARCTION  . CAD  .  Atrial fibrillation  . BRADYCARDIA-TACHYCARDIA SYNDROME  . SUPRAVENTRICULAR TACHYCARDIA  . RENAL INSUFFICIENCY  . SYNCOPE  . Diabetes mellitus  . Anemia associated with chronic renal failure  . Dyspnea  . Rectus sheath hematoma  . Acute blood loss anemia  . Hypotension  . Coagulopathy  . Diabetes mellitus  . CAD (coronary artery disease)  . HTN (hypertension)  . Paroxysmal a-fib  . UTI (lower urinary tract infection)  . AKI (acute kidney injury)    History  Smoking status  . Former Smoker  . Types: Cigarettes  . Quit date: 05/22/1963  Smokeless tobacco  . Never Used    History  Alcohol Use No    Family History  Problem Relation Age of Onset  . Heart failure Father 48  . Gallbladder disease Father   . COPD    . Coronary artery disease    . Emphysema      Review of Systems: Constitutional: no fever chills diaphoresis or fatigue or change in weight.  Head  and neck: no hearing loss, no epistaxis, no photophobia or visual disturbance. Respiratory: No cough, shortness of breath or wheezing. Cardiovascular: No chest pain peripheral edema, palpitations. Gastrointestinal: No abdominal distention, no abdominal pain, no change in bowel habits hematochezia or melena. Genitourinary: No dysuria, no frequency, no urgency, no nocturia. Musculoskeletal:No arthralgias, no back pain, no gait disturbance or myalgias. Neurological: No dizziness, no headaches, no numbness, no seizures, no syncope, no weakness, no tremors. Hematologic: No lymphadenopathy, no easy bruising. Psychiatric: No confusion, no hallucinations, no sleep disturbance.    Physical Exam: Filed Vitals:   12/28/11 1422  BP: 120/78  Pulse: 84   general appearance reveals a well-developed well-nourished overweight gentleman in no acute distress.  His skin is slightly pale.Pupils equal and reactive.   Extraocular Movements are full.  There is no scleral icterus.  The mouth and pharynx are normal.  The neck is supple.  The carotids reveal no bruits.  The jugular venous pressure is normal.  The thyroid is not enlarged.  There is no lymphadenopathy.  Chest is clear.  The heart reveals an irregular rhythm without gallop murmur click or rub. Abdomen is obese and nontender Extremities show no significant edema now. Strength is normal and symmetrical in all extremities.  There is no lateralizing weakness.  There are no sensory deficits.  The skin is warm and dry.  There is no rash.  EKG shows atrial flutter with ventricular pacing  Assessment / Plan: Continue same medication.  He will continue on a low-salt diet and if he does go out to eat at a restaurant he will take an extra Lasix afterwards.  We are calling him in amoxicillin for his UTI.  He'll return in 2 months for followup office visit and basal metabolic panel.  Anticipate he will restart Coumadin after his orchiectomy on August 30 and he  will then be followed again in our Coumadin clinic

## 2011-12-28 NOTE — Assessment & Plan Note (Signed)
The patient has symptoms of an acute urinary tract infection and is requesting something for this.  We will get a urinalysis and culture today but in the meantime start him on amoxicillin 500 mg 3 times a day #20

## 2011-12-28 NOTE — Assessment & Plan Note (Signed)
We are checking lab work today to follow his kidney function.  He went home from the hospital his creatinine was 3.6 and he states that most recently the home health nurse checked it last week it was down to 2.5

## 2011-12-29 LAB — URINALYSIS W MICROSCOPIC + REFLEX CULTURE
Bilirubin Urine: NEGATIVE
Crystals: NONE SEEN
Glucose, UA: NEGATIVE mg/dL
Protein, ur: 100 mg/dL — AB
Specific Gravity, Urine: 1.012 (ref 1.005–1.030)
Squamous Epithelial / LPF: NONE SEEN
WBC, UA: >50 WBC/hpf — AB (ref <3)
pH: 5 (ref 5.0–8.0)

## 2011-12-31 ENCOUNTER — Telehealth: Payer: Self-pay | Admitting: *Deleted

## 2011-12-31 LAB — URINE CULTURE: Colony Count: >=100000

## 2011-12-31 NOTE — Telephone Encounter (Signed)
Advised wife patient of lab results  

## 2011-12-31 NOTE — Telephone Encounter (Signed)
Message copied by Burnell Blanks on Mon Dec 31, 2011  5:31 PM ------      Message from: Cassell Clement      Created: Mon Dec 31, 2011  9:22 AM       Please report.  Urinalysis and culture is growing out organisms.  Keep taking the present antibiotic until we have sensitivities on the urine culture.  His renal function is slightly better with creatinine down to 2.88 and BUN 47.  The white count is normal and the hemoglobin is 10.5

## 2012-01-01 ENCOUNTER — Telehealth: Payer: Self-pay | Admitting: *Deleted

## 2012-01-01 DIAGNOSIS — N390 Urinary tract infection, site not specified: Secondary | ICD-10-CM

## 2012-01-01 MED ORDER — CIPROFLOXACIN HCL 250 MG PO TABS: 250.0000 mg | ORAL_TABLET | Freq: Two times a day (BID) | ORAL | Status: AC

## 2012-01-01 NOTE — Telephone Encounter (Signed)
Advised wife of culture results, rx sent to [facility]

## 2012-01-01 NOTE — Telephone Encounter (Signed)
Message copied by Burnell Blanks on Tue Jan 01, 2012 10:13 AM ------      Message from: Cassell Clement      Created: Mon Dec 31, 2011  9:13 PM       Please report.  Need to change antibiotic. Not sensitive to amoxicillin. Switch to cipro 250 mg BID x 5 days

## 2012-01-07 ENCOUNTER — Ambulatory Visit: Payer: Self-pay | Admitting: Cardiovascular Disease

## 2012-01-07 DIAGNOSIS — I4891 Unspecified atrial fibrillation: Secondary | ICD-10-CM

## 2012-01-08 ENCOUNTER — Telehealth: Payer: Self-pay | Admitting: Cardiology

## 2012-01-08 NOTE — Telephone Encounter (Signed)
Keep Lantus at current level as long as he is not having any hypoglycemic reactions.  If he starts to have hypoglycemic reactions, he can cut back on his Lantus to 20 units.

## 2012-01-08 NOTE — Telephone Encounter (Signed)
New msg Pt's wife called about his insulin level it was 90 this morning. Please call

## 2012-01-08 NOTE — Telephone Encounter (Signed)
Left message, ask to call back and let me know message received

## 2012-01-08 NOTE — Telephone Encounter (Signed)
1) After taking antibiotic blood sugars came down.  Blood sugars have been down in low 100's  (highest 147 in evening) and this is am 90.  Takes lantus 22 units at night. Will forward to  Dr. Patty Sermons for review

## 2012-01-08 NOTE — Telephone Encounter (Signed)
Spoke with wife and advised.

## 2012-01-10 ENCOUNTER — Other Ambulatory Visit (HOSPITAL_BASED_OUTPATIENT_CLINIC_OR_DEPARTMENT_OTHER): Payer: Medicare Other | Admitting: Lab

## 2012-01-10 ENCOUNTER — Ambulatory Visit (HOSPITAL_BASED_OUTPATIENT_CLINIC_OR_DEPARTMENT_OTHER): Payer: Medicare Other

## 2012-01-10 VITALS — BP 127/69 | HR 81 | Temp 98.2°F

## 2012-01-10 DIAGNOSIS — D631 Anemia in chronic kidney disease: Secondary | ICD-10-CM

## 2012-01-10 DIAGNOSIS — N189 Chronic kidney disease, unspecified: Secondary | ICD-10-CM

## 2012-01-10 DIAGNOSIS — N039 Chronic nephritic syndrome with unspecified morphologic changes: Secondary | ICD-10-CM

## 2012-01-10 LAB — CBC WITH DIFFERENTIAL/PLATELET
BASO%: 0.5 % (ref 0.0–2.0)
EOS%: 1.8 % (ref 0.0–7.0)
LYMPH%: 9.6 % — ABNORMAL LOW (ref 14.0–49.0)
MCH: 30.7 pg (ref 27.2–33.4)
MCHC: 34.4 g/dL (ref 32.0–36.0)
MCV: 89.4 fL (ref 79.3–98.0)
MONO%: 7.4 % (ref 0.0–14.0)
Platelets: 145 10*3/uL (ref 140–400)
RBC: 3.22 10*6/uL — ABNORMAL LOW (ref 4.20–5.82)
nRBC: 0 % (ref 0–0)

## 2012-01-10 MED ORDER — DARBEPOETIN ALFA-POLYSORBATE 500 MCG/ML IJ SOLN
300.0000 ug | Freq: Once | INTRAMUSCULAR | Status: AC
  Administered 2012-01-10: 300 ug via SUBCUTANEOUS
  Filled 2012-01-10: qty 1

## 2012-01-18 ENCOUNTER — Other Ambulatory Visit: Payer: Medicare Other | Admitting: Lab

## 2012-01-18 ENCOUNTER — Ambulatory Visit: Payer: Medicare Other | Admitting: Oncology

## 2012-01-23 ENCOUNTER — Ambulatory Visit: Payer: Medicare Other | Admitting: Cardiology

## 2012-01-24 ENCOUNTER — Telehealth: Payer: Self-pay | Admitting: *Deleted

## 2012-01-24 ENCOUNTER — Ambulatory Visit (INDEPENDENT_AMBULATORY_CARE_PROVIDER_SITE_OTHER): Payer: BC Managed Care – PPO | Admitting: *Deleted

## 2012-01-24 ENCOUNTER — Other Ambulatory Visit (HOSPITAL_BASED_OUTPATIENT_CLINIC_OR_DEPARTMENT_OTHER): Payer: Medicare Other | Admitting: Lab

## 2012-01-24 ENCOUNTER — Encounter: Payer: Self-pay | Admitting: Internal Medicine

## 2012-01-24 ENCOUNTER — Ambulatory Visit (HOSPITAL_BASED_OUTPATIENT_CLINIC_OR_DEPARTMENT_OTHER): Payer: Medicare Other | Admitting: Oncology

## 2012-01-24 VITALS — BP 156/76 | HR 85 | Temp 98.5°F | Resp 20 | Ht 67.0 in | Wt 241.3 lb

## 2012-01-24 DIAGNOSIS — I498 Other specified cardiac arrhythmias: Secondary | ICD-10-CM

## 2012-01-24 DIAGNOSIS — N289 Disorder of kidney and ureter, unspecified: Secondary | ICD-10-CM

## 2012-01-24 DIAGNOSIS — D638 Anemia in other chronic diseases classified elsewhere: Secondary | ICD-10-CM

## 2012-01-24 DIAGNOSIS — D649 Anemia, unspecified: Secondary | ICD-10-CM

## 2012-01-24 DIAGNOSIS — D631 Anemia in chronic kidney disease: Secondary | ICD-10-CM

## 2012-01-24 DIAGNOSIS — C61 Malignant neoplasm of prostate: Secondary | ICD-10-CM

## 2012-01-24 DIAGNOSIS — N189 Chronic kidney disease, unspecified: Secondary | ICD-10-CM

## 2012-01-24 DIAGNOSIS — I495 Sick sinus syndrome: Secondary | ICD-10-CM

## 2012-01-24 DIAGNOSIS — Z85038 Personal history of other malignant neoplasm of large intestine: Secondary | ICD-10-CM

## 2012-01-24 LAB — CBC WITH DIFFERENTIAL/PLATELET
BASO%: 0.3 % (ref 0.0–2.0)
EOS%: 2.4 % (ref 0.0–7.0)
HCT: 33.5 % — ABNORMAL LOW (ref 38.4–49.9)
MCH: 31.7 pg (ref 27.2–33.4)
MCHC: 33.7 g/dL (ref 32.0–36.0)
NEUT%: 79.9 % — ABNORMAL HIGH (ref 39.0–75.0)
RBC: 3.57 10*6/uL — ABNORMAL LOW (ref 4.20–5.82)
RDW: 16.9 % — ABNORMAL HIGH (ref 11.0–14.6)
lymph#: 0.9 10*3/uL (ref 0.9–3.3)

## 2012-01-24 NOTE — Patient Instructions (Addendum)
No injection today.  Return on 02/07/12 for lab and injection  I will see you back on 03/13/12 with labs and injection

## 2012-01-24 NOTE — Telephone Encounter (Signed)
Gave patient appointment for 03-13-2012 starting at 8:30am

## 2012-01-29 LAB — REMOTE PACEMAKER DEVICE
AL AMPLITUDE: 2 mv
RV LEAD IMPEDENCE PM: 325 Ohm
VENTRICULAR PACING PM: 59

## 2012-02-01 NOTE — Progress Notes (Signed)
OFFICE PROGRESS NOTE  CC: Dr. Broadus Marbin Hemal  Cassell Clement, MD [address] [place] [address]  DIAGNOSIS: 78 year old gentleman with  #1 stage II adenocarcinoma of the colon originally diagnosed in July 2005 on observation.  #2 prostate cancer status post brachii therapy is currently receiving Lupron every 6 month is followed by Dr. Luane School at [facility].   #3 Anemia secondary to renal insufficiency and chronic disease on Aranesp injections 300 mg every 3 weeks to keep her hemoglobin at or above 11 g  CURRENT THERAPY: Aranesp 300 mg every 3 week,   INTERVAL HISTORY: Jeffery Gibson 78 y.o. male returns for followup visit. Since his last visit his he has had a orchiectomy. He tolerated it well without any problems. Today he feels well he will not receive any injections today since his hemoglobin is acceptable. He denies any nausea vomiting fevers chills night sweats no bleeding. His renal function is also improving. Remainder of the 10 point review of systems is unremarkable.  MEDICAL HISTORY: Past Medical History  Diagnosis Date  . Ischemic heart disease 05/06/06    post CARG 05/06/06  . Atrial flutter     afib and atrial flutter (permanent)  . Tachycardia-bradycardia 1997    dual-chamber/for tachybradycardia syndrome  . Obese     exogenous  . Renal insufficiency   . Anemia of chronic disease     aranesp injections  . Dyslipidemia   . Coronary artery disease   . Anemia associated with chronic renal failure 04/05/2011  . Cancer     prostate/on Lupron  . Adenocarcinoma of colon 11/2003    stage 2(T3,N0,M0)  . Diabetes mellitus   . Hypertension     ALLERGIES:  is allergic to bextra; latex; lovenox; lupron; metformin and related; mevacor; ramipril; and warfarin and related.  MEDICATIONS:  Current Outpatient Prescriptions  Medication Sig Dispense Refill  . amiodarone (PACERONE) 200 MG tablet TAKE (1/2) TABLET DAILY.   45 tablet  4  . Ascorbic Acid (VITAMIN C PO) Take 1 tablet by mouth daily.        Marland Kitchen aspirin 81 MG tablet Take 81 mg by mouth daily.        . Cholecalciferol (VITAMIN D) 2000 UNITS tablet Take 2,000 Units by mouth daily.        . Darbepoetin Alfa-Albumin (ARANESP IJ) Inject 1 Syringe as directed See admin instructions. Every 4 weeks as needed for anemia las given 09-2011      . FINASTERIDE PO Take 5 mg by mouth at bedtime.       . furosemide (LASIX) 80 MG tablet Take 40 mg by mouth daily.       . insulin glargine (LANTUS SOLOSTAR) 100 UNIT/ML injection Inject 22 Units into the skin at bedtime.  10 mL  prn  . metoprolol (LOPRESSOR) 50 MG tablet Take 50 mg by mouth 2 (two) times daily.      . potassium chloride SA (K-DUR,KLOR-CON) 20 MEQ tablet Take 20 mEq by mouth every other day.      Marland Kitchen PRILOSEC 20 MG capsule TAKE 1-2 CAPSULES DAILY.  180 each  3  . rosuvastatin (CRESTOR) 10 MG tablet Take 5 mg by mouth See admin instructions. 1/2 tablet Monday Wednesday and Friday and pt takes nothing  On rest of days      . Tamsulosin HCl (FLOMAX) 0.4 MG CAPS Take 0.4 mg by mouth daily after supper.       Marland Kitchen  ursodiol (ACTIGALL) 300 MG capsule Take 300 mg by mouth 2 (two) times daily.         SURGICAL HISTORY:  Past Surgical History  Procedure Date  . Laparotomy 12/04/2003    resection of rectosigmoid carcinoma/  . Radioactive seed implant 05/2005    transperianeal placement I-125 for prostate cancer/# of  seeds 55  . Cardiac catheterization 05/01/06    EF 40%/diffuse 3 vessel CAD/tight L antereior descending artery stenosis/diffuse disease proximal L anterior descending  . Coronary artery bypass graft 05/06/06    x4 with L internal mammary artery to the L anterior descending coronary artery  . Pacemaker removal 1997    REVIEW OF SYSTEMS:  A comprehensive review of systems was negative except for: Cardiovascular: positive for dyspnea and irregular heart beat Genitourinary: positive for urinary  incontinence   PHYSICAL EXAMINATION: General appearance: alert, cooperative, mild distress, moderately obese and pale Neck: no adenopathy, no carotid bruit, no JVD, supple, symmetrical, trachea midline and thyroid not enlarged, symmetric, no tenderness/mass/nodules Resp: clear to auscultation bilaterally and normal percussion bilaterally Cardio: irregularly irregular rhythm GI: soft, non-tender; bowel sounds normal; no masses,  no organomegaly Extremities: extremities normal, atraumatic, no cyanosis or edema Neurologic: Alert and oriented X 3, normal strength and tone. Normal symmetric reflexes. Normal coordination and gait Sensory: normal Motor: grossly normal  ECOG PERFORMANCE STATUS: 1 - Symptomatic but completely ambulatory  Blood pressure 156/76, pulse 85, temperature 98.5 F (36.9 C), temperature source Oral, resp. rate 20, height 5\' 7"  (1.702 m), weight 241 lb 4.8 oz (109.453 kg).  LABORATORY DATA: Lab Results  Component Value Date   WBC 8.6 01/24/2012   HGB 11.3* 01/24/2012   HCT 33.5* 01/24/2012   MCV 93.8 01/24/2012   PLT 176 01/24/2012      Chemistry      Component Value Date/Time   NA 135 12/28/2011 1546   NA 141 02/09/2010 1156   K 4.8 12/28/2011 1546   K 4.5 02/09/2010 1156   CL 102 12/28/2011 1546   CL 98 02/09/2010 1156   CO2 22 12/28/2011 1546   CO2 25 02/09/2010 1156   BUN 47* 12/28/2011 1546   BUN 36* 02/09/2010 1156   CREATININE 2.88* 12/28/2011 1546   CREATININE 3.65* 11/19/2011 0555      Component Value Date/Time   CALCIUM 9.0 12/28/2011 1546   CALCIUM 8.6 02/09/2010 1156   ALKPHOS 73 11/07/2011 0915   ALKPHOS 77 02/09/2010 1156   AST 11 11/07/2011 0915   AST 25 02/09/2010 1156   ALT 9 11/07/2011 0915   BILITOT 0.6 11/07/2011 0915   BILITOT 0.70 02/09/2010 1156       ASSESSMENT: 78 year old gentleman with:  #1 anemia of chronic disease.  #2 Status post oriectomy for a rising PSA for his prostate cancer  #3 recent GI bleed requiring extensive hospitalization.  PLAN:    #1 patient will not be receiving Aranesp injection today as his hemoglobin is above 11.  #2 Patient is now status post bilateral orchiectomy overall doing well #3 I will plan on seeing him back in about one month's time for followup.  All questions were answered. The patient knows to call the clinic with any problems, questions or concerns. We can certainly see the patient much sooner if necessary.  I spent 15 minutes counseling the patient face to face. The total time spent in the appointment was 30 minutes.    Drue Second, MD Medical/Oncology [facility] [phone] (beeper) [phone] (Office)

## 2012-02-05 ENCOUNTER — Telehealth: Payer: Self-pay | Admitting: *Deleted

## 2012-02-05 ENCOUNTER — Telehealth: Payer: Self-pay | Admitting: Cardiology

## 2012-02-05 NOTE — Telephone Encounter (Signed)
Pt complaining of uti symptoms.  I explained that he needs to contact his pcp for a urine test and possible antibiotics.

## 2012-02-05 NOTE — Telephone Encounter (Signed)
PT HAS ANOTHER BLADDER INFECTION, WIFE REQUESTING RX AT [facility], PLS CALL [phone]

## 2012-02-05 NOTE — Telephone Encounter (Signed)
Pt wife called to check if pt still needs to come in 9/19 for lab and injection. Per MD, notified pt's wife to please have pt keep lab/Injection appt. Jeffery Gibson verbalized understanding and  thanked mefor the call

## 2012-02-07 ENCOUNTER — Other Ambulatory Visit (HOSPITAL_BASED_OUTPATIENT_CLINIC_OR_DEPARTMENT_OTHER): Payer: Medicare Other | Admitting: Lab

## 2012-02-07 ENCOUNTER — Ambulatory Visit: Payer: Medicare Other

## 2012-02-07 DIAGNOSIS — N189 Chronic kidney disease, unspecified: Secondary | ICD-10-CM

## 2012-02-07 DIAGNOSIS — D631 Anemia in chronic kidney disease: Secondary | ICD-10-CM

## 2012-02-07 DIAGNOSIS — C61 Malignant neoplasm of prostate: Secondary | ICD-10-CM

## 2012-02-07 LAB — CBC WITH DIFFERENTIAL/PLATELET
Basophils Absolute: 0 10*3/uL (ref 0.0–0.1)
EOS%: 2.1 % (ref 0.0–7.0)
HCT: 33.5 % — ABNORMAL LOW (ref 38.4–49.9)
HGB: 11.3 g/dL — ABNORMAL LOW (ref 13.0–17.1)
LYMPH%: 8.7 % — ABNORMAL LOW (ref 14.0–49.0)
MCH: 31.8 pg (ref 27.2–33.4)
MCV: 94.4 fL (ref 79.3–98.0)
MONO%: 7.2 % (ref 0.0–14.0)
NEUT%: 81.6 % — ABNORMAL HIGH (ref 39.0–75.0)
Platelets: 151 10*3/uL (ref 140–400)
lymph#: 0.8 10*3/uL — ABNORMAL LOW (ref 0.9–3.3)

## 2012-02-07 MED ORDER — DARBEPOETIN ALFA-POLYSORBATE 500 MCG/ML IJ SOLN: 300.0000 ug | Freq: Once | INTRAMUSCULAR | Status: DC

## 2012-02-13 ENCOUNTER — Ambulatory Visit: Payer: Medicare Other | Admitting: Oncology

## 2012-02-20 ENCOUNTER — Other Ambulatory Visit: Payer: Self-pay | Admitting: *Deleted

## 2012-02-20 MED ORDER — ROSUVASTATIN CALCIUM 10 MG PO TABS: 10.0000 mg | ORAL_TABLET | ORAL | Status: DC

## 2012-02-20 NOTE — Telephone Encounter (Signed)
Fax Received. Refill Completed. Jeffery Gibson (R.M.A)   

## 2012-02-22 ENCOUNTER — Encounter: Payer: Self-pay | Admitting: *Deleted

## 2012-02-26 ENCOUNTER — Telehealth: Payer: Self-pay | Admitting: Cardiology

## 2012-02-26 NOTE — Telephone Encounter (Signed)
New Problem:   Patient's wife called in wanting to know what the status was of the letter being written to the [place] for her husband.  Please call back.

## 2012-02-26 NOTE — Telephone Encounter (Signed)
Printed information and mailed to patient.

## 2012-03-04 ENCOUNTER — Ambulatory Visit: Payer: Medicare Other | Admitting: Cardiology

## 2012-03-10 ENCOUNTER — Encounter: Payer: Self-pay | Admitting: Cardiology

## 2012-03-10 ENCOUNTER — Ambulatory Visit (INDEPENDENT_AMBULATORY_CARE_PROVIDER_SITE_OTHER): Payer: Medicare Other | Admitting: Cardiology

## 2012-03-10 VITALS — BP 148/81 | HR 67 | Ht 68.0 in | Wt 238.0 lb

## 2012-03-10 DIAGNOSIS — I259 Chronic ischemic heart disease, unspecified: Secondary | ICD-10-CM

## 2012-03-10 DIAGNOSIS — I4891 Unspecified atrial fibrillation: Secondary | ICD-10-CM

## 2012-03-10 DIAGNOSIS — I10 Essential (primary) hypertension: Secondary | ICD-10-CM

## 2012-03-10 DIAGNOSIS — I495 Sick sinus syndrome: Secondary | ICD-10-CM

## 2012-03-10 NOTE — Assessment & Plan Note (Signed)
The patient is remaining in normal sinus rhythm.  Because of his multiple comorbidities and previous problems with hemorrhage we will not resume his warfarin unless he has more problems with atrial fib.

## 2012-03-10 NOTE — Assessment & Plan Note (Signed)
Patient is remaining AV paced rhythm.  He has not been having any palpitations or tachycardia

## 2012-03-10 NOTE — Patient Instructions (Signed)
Stop your Potassium  Your physician recommends that you schedule a follow-up appointment in: 3 month ov/bmet

## 2012-03-10 NOTE — Assessment & Plan Note (Signed)
Blood pressure was remaining stable on current therapy 

## 2012-03-10 NOTE — Progress Notes (Signed)
Jeffery Gibson Date of Birth:  03-19-1929 [facility] [address] [place], [place]  [address] [phone]         Fax   [phone]  History of Present Illness: This pleasant 78 year old gentleman is seen for a scheduled followup office visit. He has a complex past medical history. He has known ischemic heart disease. He is diabetic. He has a history of tachybradycardia syndrome and paroxysmal atrial fibrillation. He was recently admitted with complications of bleeding following Lovenox injections. He has renal insufficiency which is improving slowly. He has a malignant neoplasm of the prostate with metastases and recently underwent  orchiectomy at [facility] on August 30.  He saw his new family doctor at [facility] recently and he reports that his hemoglobin A1c was 6.5 and his creatinine was 2.3.  His potassium was 5.0. The patient denies any recent chest pain or angina.  His weight has been stable.  He has not been having any significant peripheral edema.  Current Outpatient Prescriptions  Medication Sig Dispense Refill  . amiodarone (PACERONE) 200 MG tablet TAKE (1/2) TABLET DAILY.  45 tablet  4  . Ascorbic Acid (VITAMIN C PO) Take 1 tablet by mouth daily.        Marland Kitchen aspirin 81 MG tablet Take 81 mg by mouth daily.        . Cholecalciferol (VITAMIN D) 2000 UNITS tablet Take 2,000 Units by mouth daily.        . Darbepoetin Alfa-Albumin (ARANESP IJ) Inject 1 Syringe as directed See admin instructions. Every 4 weeks as needed for anemia las given 09-2011      . FINASTERIDE PO Take 5 mg by mouth at bedtime.       . furosemide (LASIX) 80 MG tablet Take 40 mg by mouth daily.       . insulin glargine (LANTUS) 100 UNIT/ML injection Inject 15 Units into the skin at bedtime.      . metoprolol (LOPRESSOR) 50 MG tablet Take 50 mg by mouth 2 (two) times daily.      Marland Kitchen oxybutynin (DITROPAN-XL) 10 MG 24 hr tablet Take 10 mg by mouth daily.      Marland Kitchen PRILOSEC 20 MG capsule  TAKE 1-2 CAPSULES DAILY.  180 each  3  . rosuvastatin (CRESTOR) 10 MG tablet See admin instructions. Take 1/2 tab on mon wed and fri      . Tamsulosin HCl (FLOMAX) 0.4 MG CAPS Take 0.4 mg by mouth daily after supper.       . ursodiol (ACTIGALL) 300 MG capsule Take 300 mg by mouth 2 (two) times daily.       Marland Kitchen DISCONTD: insulin glargine (LANTUS SOLOSTAR) 100 UNIT/ML injection Inject 22 Units into the skin at bedtime.  10 mL  prn  . DISCONTD: rosuvastatin (CRESTOR) 10 MG tablet Take 1 tablet (10 mg total) by mouth See admin instructions.  90 tablet  3    Allergies  Allergen Reactions  . Bextra (Valdecoxib) Diarrhea  . Latex Hives  . Lovenox (Enoxaparin Sodium)     Gi bleed  . Lupron (Leuprolide Acetate)     Kidney failure  . Metformin And Related Nausea Only  . Mevacor (Lovastatin) Nausea And Vomiting  . Ramipril Cough  . Warfarin And Related     Gi bleed    Patient Active Problem List  Diagnosis  . PROSTATE CANCER  . ADENOMATOUS COLONIC POLYP  . DM  . HYPERLIPIDEMIA  . HYPERTENSION  . MYOCARDIAL INFARCTION  .  CAD  . Atrial fibrillation  . BRADYCARDIA-TACHYCARDIA SYNDROME  . SUPRAVENTRICULAR TACHYCARDIA  . RENAL INSUFFICIENCY  . SYNCOPE  . Diabetes mellitus  . Anemia associated with chronic renal failure  . Dyspnea  . Rectus sheath hematoma  . Acute blood loss anemia  . Hypotension  . Coagulopathy  . Diabetes mellitus  . CAD (coronary artery disease)  . HTN (hypertension)  . Paroxysmal a-fib  . UTI (lower urinary tract infection)  . AKI (acute kidney injury)    History  Smoking status  . Former Smoker  . Types: Cigarettes  . Quit date: 05/22/1963  Smokeless tobacco  . Never Used    History  Alcohol Use No    Family History  Problem Relation Age of Onset  . Heart failure Father 15  . Gallbladder disease Father   . COPD    . Coronary artery disease    . Emphysema      Review of Systems: Constitutional: no fever chills diaphoresis or fatigue or  change in weight.  Head and neck: no hearing loss, no epistaxis, no photophobia or visual disturbance. Respiratory: No cough, shortness of breath or wheezing. Cardiovascular: No chest pain peripheral edema, palpitations. Gastrointestinal: No abdominal distention, no abdominal pain, no change in bowel habits hematochezia or melena. Genitourinary: No dysuria, no frequency, no urgency, no nocturia. Musculoskeletal:No arthralgias, no back pain, no gait disturbance or myalgias. Neurological: No dizziness, no headaches, no numbness, no seizures, no syncope, no weakness, no tremors. Hematologic: No lymphadenopathy, no easy bruising. Psychiatric: No confusion, no hallucinations, no sleep disturbance.    Physical Exam: Filed Vitals:   03/10/12 1529  BP: 148/81  Pulse: 67   the general appearance reveals a well-developed moderately obese gentleman in no acute distress.Pupils equal and reactive.   Extraocular Movements are full.  There is no scleral icterus.  The mouth and pharynx are normal.  The neck is supple.  The carotids reveal no bruits.  The jugular venous pressure is normal.  The thyroid is not enlarged.  There is no lymphadenopathy.  The chest is clear to percussion and auscultation. There are no rales or rhonchi. Expansion of the chest is symmetrical.  The precordium is quiet.  The first heart sound is normal.  The second heart sound is physiologically split.  There is no murmur gallop rub or click.  There is no abnormal lift or heave.  The abdomen is soft and nontender. Bowel sounds are normal. The liver and spleen are not enlarged. There Are no abdominal masses. There are no bruits.  The abdomen is obese Extremities reveal no phlebitis or significant edema.Strength is normal and symmetrical in all extremities.  There is no lateralizing weakness.  There are no sensory deficits.  The skin is warm and dry.  There is no rash.  EKG shows AV dual paced rhythm with PR interval of 260  ms     Assessment / Plan: Continue same medication.  He is requesting that he be allowed to stop his potassium because it is such a large pill for him to swallow.  Since his Lasix dose is lower and he has renal insufficiency we will stop his K. Dur now.  Recheck in 3 months for followup office visit and basal metabolic panel.  His primary care physician will help with the management of his diabetes.

## 2012-03-13 ENCOUNTER — Telehealth: Payer: Self-pay | Admitting: *Deleted

## 2012-03-13 ENCOUNTER — Telehealth: Payer: Self-pay | Admitting: Oncology

## 2012-03-13 ENCOUNTER — Encounter: Payer: Self-pay | Admitting: Oncology

## 2012-03-13 ENCOUNTER — Other Ambulatory Visit (HOSPITAL_BASED_OUTPATIENT_CLINIC_OR_DEPARTMENT_OTHER): Payer: Medicare Other | Admitting: Lab

## 2012-03-13 ENCOUNTER — Ambulatory Visit (HOSPITAL_BASED_OUTPATIENT_CLINIC_OR_DEPARTMENT_OTHER): Payer: Medicare Other | Admitting: Oncology

## 2012-03-13 VITALS — BP 162/78 | HR 89 | Temp 98.2°F | Resp 20 | Ht 68.0 in | Wt 236.3 lb

## 2012-03-13 DIAGNOSIS — D649 Anemia, unspecified: Secondary | ICD-10-CM

## 2012-03-13 DIAGNOSIS — D631 Anemia in chronic kidney disease: Secondary | ICD-10-CM

## 2012-03-13 DIAGNOSIS — N189 Chronic kidney disease, unspecified: Secondary | ICD-10-CM

## 2012-03-13 DIAGNOSIS — Z85038 Personal history of other malignant neoplasm of large intestine: Secondary | ICD-10-CM

## 2012-03-13 DIAGNOSIS — N039 Chronic nephritic syndrome with unspecified morphologic changes: Secondary | ICD-10-CM

## 2012-03-13 DIAGNOSIS — C61 Malignant neoplasm of prostate: Secondary | ICD-10-CM

## 2012-03-13 LAB — CBC WITH DIFFERENTIAL/PLATELET
Basophils Absolute: 0 10*3/uL (ref 0.0–0.1)
EOS%: 3 % (ref 0.0–7.0)
HGB: 10.9 g/dL — ABNORMAL LOW (ref 13.0–17.1)
MCH: 32.4 pg (ref 27.2–33.4)
MCV: 94.9 fL (ref 79.3–98.0)
MONO%: 8.7 % (ref 0.0–14.0)
RDW: 13.6 % (ref 11.0–14.6)

## 2012-03-13 LAB — BASIC METABOLIC PANEL (CC13)
BUN: 44 mg/dL — ABNORMAL HIGH (ref 7.0–26.0)
Calcium: 9.2 mg/dL (ref 8.4–10.4)
Creatinine: 2.1 mg/dL — ABNORMAL HIGH (ref 0.7–1.3)
Potassium: 4.5 mEq/L (ref 3.5–5.1)

## 2012-03-13 LAB — FERRITIN: Ferritin: 692 ng/mL — ABNORMAL HIGH (ref 22–322)

## 2012-03-13 LAB — IRON AND TIBC
%SAT: 46 % (ref 20–55)
Iron: 107 ug/dL (ref 42–165)

## 2012-03-13 MED ORDER — DARBEPOETIN ALFA-POLYSORBATE 300 MCG/0.6ML IJ SOLN
300.0000 ug | Freq: Once | INTRAMUSCULAR | Status: AC
  Administered 2012-03-13: 300 ug via SUBCUTANEOUS
  Filled 2012-03-13: qty 1

## 2012-03-13 NOTE — Telephone Encounter (Signed)
gve the pt his nov-may 2014 appt calendar

## 2012-03-13 NOTE — Patient Instructions (Addendum)
Continue aranesp every 4 weeks   I will see you back in December 2013

## 2012-03-13 NOTE — Progress Notes (Signed)
OFFICE PROGRESS NOTE  CC: Dr. Almetta Lovely, Lorin Picket, MD [address] [address]  DIAGNOSIS: 78 year old gentleman with  #1 stage II adenocarcinoma of the colon originally diagnosed in July 2005 on observation.  #2 prostate cancer status post brachii therapy is currently receiving Lupron every 6 month is followed by Dr. Luane School at [facility].   #3 Anemia secondary to renal insufficiency and chronic disease on Aranesp injections 300 mg every 3 weeks to keep her hemoglobin at or above 11 g  CURRENT THERAPY: Aranesp 300 mg every 3 week,   INTERVAL HISTORY: Jeffery Gibson 77 y.o. male returns for followup visit. Today he feels well he will not receive any injections today since his hemoglobin is acceptable. He denies any nausea vomiting fevers chills night sweats no bleeding. His renal function is also improving. Remainder of the 10 point review of systems is unremarkable.  MEDICAL HISTORY: Past Medical History  Diagnosis Date  . Ischemic heart disease 05/06/06    post CARG 05/06/06  . Atrial flutter     afib and atrial flutter (permanent)  . Tachycardia-bradycardia 1997    dual-chamber/for tachybradycardia syndrome  . Obese     exogenous  . Renal insufficiency   . Anemia of chronic disease     aranesp injections  . Dyslipidemia   . Coronary artery disease   . Anemia associated with chronic renal failure 04/05/2011  . Cancer     prostate/on Lupron  . Adenocarcinoma of colon 11/2003    stage 2(T3,N0,M0)  . Diabetes mellitus   . Hypertension     ALLERGIES:  is allergic to bextra; latex; lovenox; lupron; metformin and related; mevacor; ramipril; and warfarin and related.  MEDICATIONS:  Current Outpatient Prescriptions  Medication Sig Dispense Refill  . amiodarone (PACERONE) 200 MG tablet TAKE (1/2) TABLET DAILY.  45 tablet  4  . Ascorbic Acid (VITAMIN C PO) Take 1 tablet by mouth daily.        Marland Kitchen aspirin 81 MG tablet  Take 81 mg by mouth daily.        . Cholecalciferol (VITAMIN D) 2000 UNITS tablet Take 2,000 Units by mouth daily.        . Darbepoetin Alfa-Albumin (ARANESP IJ) Inject 1 Syringe as directed See admin instructions. Every 4 weeks as needed for anemia las given 09-2011      . FINASTERIDE PO Take 5 mg by mouth at bedtime.       . furosemide (LASIX) 80 MG tablet Take 40 mg by mouth daily.       . insulin glargine (LANTUS) 100 UNIT/ML injection Inject 15 Units into the skin at bedtime.      . metoprolol (LOPRESSOR) 50 MG tablet Take 50 mg by mouth 2 (two) times daily.      Marland Kitchen oxybutynin (DITROPAN-XL) 10 MG 24 hr tablet Take 10 mg by mouth daily.      Marland Kitchen PRILOSEC 20 MG capsule TAKE 1-2 CAPSULES DAILY.  180 each  3  . rosuvastatin (CRESTOR) 10 MG tablet See admin instructions. Take 1/2 tab on mon wed and fri      . Tamsulosin HCl (FLOMAX) 0.4 MG CAPS Take 0.4 mg by mouth daily after supper.       . ursodiol (ACTIGALL) 300 MG capsule Take 300 mg by mouth 2 (two) times daily.         SURGICAL HISTORY:  Past Surgical History  Procedure Date  . Laparotomy 12/04/2003  resection of rectosigmoid carcinoma/  . Radioactive seed implant 05/2005    transperianeal placement I-125 for prostate cancer/# of  seeds 55  . Cardiac catheterization 05/01/06    EF 40%/diffuse 3 vessel CAD/tight L antereior descending artery stenosis/diffuse disease proximal L anterior descending  . Coronary artery bypass graft 05/06/06    x4 with L internal mammary artery to the L anterior descending coronary artery  . Pacemaker removal 1997    REVIEW OF SYSTEMS:  A comprehensive review of systems was negative except for: Cardiovascular: positive for dyspnea and irregular heart beat Genitourinary: positive for urinary incontinence   PHYSICAL EXAMINATION: General appearance: alert, cooperative, mild distress, moderately obese and pale Neck: no adenopathy, no carotid bruit, no JVD, supple, symmetrical, trachea midline and thyroid  not enlarged, symmetric, no tenderness/mass/nodules Resp: clear to auscultation bilaterally and normal percussion bilaterally Cardio: irregularly irregular rhythm GI: soft, non-tender; bowel sounds normal; no masses,  no organomegaly Extremities: extremities normal, atraumatic, no cyanosis or edema Neurologic: Alert and oriented X 3, normal strength and tone. Normal symmetric reflexes. Normal coordination and gait Sensory: normal Motor: grossly normal  ECOG PERFORMANCE STATUS: 1 - Symptomatic but completely ambulatory  Blood pressure 162/78, pulse 89, temperature 98.2 F (36.8 C), temperature source Oral, resp. rate 20, height 5\' 8"  (1.727 m), weight 236 lb 4.8 oz (107.185 kg).  LABORATORY DATA: Lab Results  Component Value Date   WBC 7.4 03/13/2012   HGB 10.9* 03/13/2012   HCT 31.9* 03/13/2012   MCV 94.9 03/13/2012   PLT 135* 03/13/2012      Chemistry      Component Value Date/Time   NA 135 12/28/2011 1546   NA 141 02/09/2010 1156   K 4.8 12/28/2011 1546   K 4.5 02/09/2010 1156   CL 102 12/28/2011 1546   CL 98 02/09/2010 1156   CO2 22 12/28/2011 1546   CO2 25 02/09/2010 1156   BUN 47* 12/28/2011 1546   BUN 36* 02/09/2010 1156   CREATININE 2.88* 12/28/2011 1546   CREATININE 3.65* 11/19/2011 0555      Component Value Date/Time   CALCIUM 9.0 12/28/2011 1546   CALCIUM 8.6 02/09/2010 1156   ALKPHOS 73 11/07/2011 0915   ALKPHOS 77 02/09/2010 1156   AST 11 11/07/2011 0915   AST 25 02/09/2010 1156   ALT 9 11/07/2011 0915   BILITOT 0.6 11/07/2011 0915   BILITOT 0.70 02/09/2010 1156       ASSESSMENT: 78 year old gentleman with:  #1 anemia of chronic disease.  #2 Status post oriectomy for a rising PSA for his prostate cancer  #3 recent GI bleed requiring extensive hospitalization.  PLAN:  #1 patient will proceed with his Aranesp injection today.  #2 he will continue to be seen every month for CBC and Aranesp injections as needed.  #3 I will plan on seeing him back in 3-6 months time  to   All questions were answered. The patient knows to call the clinic with any problems, questions or concerns. We can certainly see the patient much sooner if necessary.  I spent 25 minutes counseling the patient face to face. The total time spent in the appointment was 30 minutes.    Drue Second, MD Medical/Oncology [facility] [phone] (beeper) [phone] (Office)

## 2012-03-13 NOTE — Telephone Encounter (Signed)
aranesp injections q 4 weeks begin 11/20 x 7

## 2012-03-26 ENCOUNTER — Other Ambulatory Visit: Payer: Self-pay | Admitting: Cardiology

## 2012-04-10 ENCOUNTER — Ambulatory Visit: Payer: Medicare Other

## 2012-04-10 ENCOUNTER — Other Ambulatory Visit (HOSPITAL_BASED_OUTPATIENT_CLINIC_OR_DEPARTMENT_OTHER): Payer: Medicare Other | Admitting: Lab

## 2012-04-10 DIAGNOSIS — N189 Chronic kidney disease, unspecified: Secondary | ICD-10-CM

## 2012-04-10 DIAGNOSIS — N039 Chronic nephritic syndrome with unspecified morphologic changes: Secondary | ICD-10-CM

## 2012-04-10 LAB — CBC WITH DIFFERENTIAL/PLATELET
BASO%: 0.5 % (ref 0.0–2.0)
HCT: 35.4 % — ABNORMAL LOW (ref 38.4–49.9)
MCHC: 33.9 g/dL (ref 32.0–36.0)
MONO#: 0.6 10*3/uL (ref 0.1–0.9)
NEUT%: 79.6 % — ABNORMAL HIGH (ref 39.0–75.0)
RBC: 3.75 10*6/uL — ABNORMAL LOW (ref 4.20–5.82)
WBC: 8.6 10*3/uL (ref 4.0–10.3)
lymph#: 0.9 10*3/uL (ref 0.9–3.3)
nRBC: 0 % (ref 0–0)

## 2012-04-10 MED ORDER — DARBEPOETIN ALFA-POLYSORBATE 500 MCG/ML IJ SOLN: 300.0000 ug | Freq: Once | INTRAMUSCULAR | Status: DC

## 2012-05-05 ENCOUNTER — Ambulatory Visit (INDEPENDENT_AMBULATORY_CARE_PROVIDER_SITE_OTHER): Payer: Medicare Other | Admitting: *Deleted

## 2012-05-05 ENCOUNTER — Encounter: Payer: Self-pay | Admitting: Internal Medicine

## 2012-05-05 DIAGNOSIS — I498 Other specified cardiac arrhythmias: Secondary | ICD-10-CM

## 2012-05-05 DIAGNOSIS — Z950 Presence of cardiac pacemaker: Secondary | ICD-10-CM

## 2012-05-06 ENCOUNTER — Other Ambulatory Visit (HOSPITAL_BASED_OUTPATIENT_CLINIC_OR_DEPARTMENT_OTHER): Payer: Medicare Other | Admitting: Lab

## 2012-05-06 ENCOUNTER — Ambulatory Visit (HOSPITAL_BASED_OUTPATIENT_CLINIC_OR_DEPARTMENT_OTHER): Payer: Medicare Other | Admitting: Oncology

## 2012-05-06 ENCOUNTER — Telehealth: Payer: Self-pay | Admitting: *Deleted

## 2012-05-06 VITALS — BP 126/71 | HR 83 | Temp 97.9°F | Resp 20 | Ht 68.0 in | Wt 238.0 lb

## 2012-05-06 DIAGNOSIS — C61 Malignant neoplasm of prostate: Secondary | ICD-10-CM

## 2012-05-06 DIAGNOSIS — R1900 Intra-abdominal and pelvic swelling, mass and lump, unspecified site: Secondary | ICD-10-CM

## 2012-05-06 DIAGNOSIS — N039 Chronic nephritic syndrome with unspecified morphologic changes: Secondary | ICD-10-CM

## 2012-05-06 DIAGNOSIS — N189 Chronic kidney disease, unspecified: Secondary | ICD-10-CM

## 2012-05-06 DIAGNOSIS — D631 Anemia in chronic kidney disease: Secondary | ICD-10-CM

## 2012-05-06 LAB — CBC WITH DIFFERENTIAL/PLATELET
Basophils Absolute: 0.1 10*3/uL (ref 0.0–0.1)
Eosinophils Absolute: 0.2 10*3/uL (ref 0.0–0.5)
HGB: 11.8 g/dL — ABNORMAL LOW (ref 13.0–17.1)
MONO#: 0.6 10*3/uL (ref 0.1–0.9)
NEUT#: 6.9 10*3/uL — ABNORMAL HIGH (ref 1.5–6.5)
RDW: 13.5 % (ref 11.0–14.6)
lymph#: 0.7 10*3/uL — ABNORMAL LOW (ref 0.9–3.3)

## 2012-05-06 LAB — IRON AND TIBC
TIBC: 244 ug/dL (ref 215–435)
UIBC: 158 ug/dL (ref 125–400)

## 2012-05-06 LAB — FERRITIN: Ferritin: 511 ng/mL — ABNORMAL HIGH (ref 22–322)

## 2012-05-06 NOTE — Progress Notes (Signed)
OFFICE PROGRESS NOTE  CC: Dr. Almetta Lovely, Lorin Picket, MD [address] [address]  DIAGNOSIS: 78 year old gentleman with  #1 stage II adenocarcinoma of the colon originally diagnosed in July 2005 on observation.  #2 prostate cancer status post brachii therapy is currently receiving Lupron every 6 month is followed by Dr. Luane School at [facility].   #3 Anemia secondary to renal insufficiency and chronic disease on Aranesp injections 300 mg every 3 weeks to keep her hemoglobin at or above 11 g  CURRENT THERAPY: Aranesp 300 mg every 3 week,   INTERVAL HISTORY: Jeffery Gibson 78 y.o. male returns for followup visit. Today he feels well he will not receive any injections today since his hemoglobin is acceptable. He denies any nausea vomiting fevers chills night sweats no bleeding. His renal function is also improving. Remainder of the 10 point review of systems is unremarkable.  MEDICAL HISTORY: Past Medical History  Diagnosis Date  . Ischemic heart disease 05/06/06    post CARG 05/06/06  . Atrial flutter     afib and atrial flutter (permanent)  . Tachycardia-bradycardia 1997    dual-chamber/for tachybradycardia syndrome  . Obese     exogenous  . Renal insufficiency   . Anemia of chronic disease     aranesp injections  . Dyslipidemia   . Coronary artery disease   . Anemia associated with chronic renal failure 04/05/2011  . Cancer     prostate/on Lupron  . Adenocarcinoma of colon 11/2003    stage 2(T3,N0,M0)  . Diabetes mellitus   . Hypertension     ALLERGIES:  is allergic to bextra; latex; lovenox; lupron; metformin and related; mevacor; ramipril; and warfarin and related.  MEDICATIONS:  Current Outpatient Prescriptions  Medication Sig Dispense Refill  . amiodarone (PACERONE) 200 MG tablet TAKE (1/2) TABLET DAILY.  45 tablet  3  . amLODipine (NORVASC) 10 MG tablet Take 10 mg by mouth daily.      . Ascorbic Acid (VITAMIN C  PO) Take 1 tablet by mouth daily.        Marland Kitchen aspirin 81 MG tablet Take 81 mg by mouth daily.        . Cholecalciferol (VITAMIN D) 2000 UNITS tablet Take 2,000 Units by mouth daily.        . Darbepoetin Alfa-Albumin (ARANESP IJ) Inject 1 Syringe as directed See admin instructions. Every 4 weeks as needed for anemia las given 09-2011      . FINASTERIDE PO Take 5 mg by mouth at bedtime.       . furosemide (LASIX) 80 MG tablet Take 40 mg by mouth daily.       . insulin glargine (LANTUS) 100 UNIT/ML injection Inject 15 Units into the skin at bedtime.      . metoprolol (LOPRESSOR) 50 MG tablet Take 50 mg by mouth 2 (two) times daily.      Marland Kitchen oxybutynin (DITROPAN-XL) 10 MG 24 hr tablet Take 10 mg by mouth daily.      Marland Kitchen PRILOSEC 20 MG capsule TAKE 1-2 CAPSULES DAILY.  180 each  3  . rosuvastatin (CRESTOR) 10 MG tablet See admin instructions. Take 1/2 tab on mon wed and fri      . Tamsulosin HCl (FLOMAX) 0.4 MG CAPS Take 0.4 mg by mouth daily after supper.       . ursodiol (ACTIGALL) 300 MG capsule Take 300 mg by mouth 2 (two) times daily.  SURGICAL HISTORY:  Past Surgical History  Procedure Date  . Laparotomy 12/04/2003    resection of rectosigmoid carcinoma/  . Radioactive seed implant 05/2005    transperianeal placement I-125 for prostate cancer/# of  seeds 55  . Cardiac catheterization 05/01/06    EF 40%/diffuse 3 vessel CAD/tight L antereior descending artery stenosis/diffuse disease proximal L anterior descending  . Coronary artery bypass graft 05/06/06    x4 with L internal mammary artery to the L anterior descending coronary artery  . Pacemaker removal 1997    REVIEW OF SYSTEMS:  A comprehensive review of systems was negative except for: Cardiovascular: positive for dyspnea and irregular heart beat Genitourinary: positive for urinary incontinence   PHYSICAL EXAMINATION: General appearance: alert, cooperative, mild distress, moderately obese and pale Neck: no adenopathy, no carotid  bruit, no JVD, supple, symmetrical, trachea midline and thyroid not enlarged, symmetric, no tenderness/mass/nodules Resp: clear to auscultation bilaterally and normal percussion bilaterally Cardio: irregularly irregular rhythm GI: soft, non-tender; bowel sounds normal; no masses,  no organomegaly Extremities: extremities normal, atraumatic, no cyanosis or edema Neurologic: Alert and oriented X 3, normal strength and tone. Normal symmetric reflexes. Normal coordination and gait Sensory: normal Motor: grossly normal  ECOG PERFORMANCE STATUS: 1 - Symptomatic but completely ambulatory  Blood pressure 126/71, pulse 83, temperature 97.9 F (36.6 C), temperature source Oral, resp. rate 20, height 5\' 8"  (1.727 m), weight 238 lb (107.956 kg).  LABORATORY DATA: Lab Results  Component Value Date   WBC 8.4 05/06/2012   HGB 11.8* 05/06/2012   HCT 33.5* 05/06/2012   MCV 94.5 05/06/2012   PLT 144 05/06/2012      Chemistry      Component Value Date/Time   NA 137 03/13/2012 0900   NA 135 12/28/2011 1546   NA 141 02/09/2010 1156   K 4.5 03/13/2012 0900   K 4.8 12/28/2011 1546   K 4.5 02/09/2010 1156   CL 102 03/13/2012 0900   CL 102 12/28/2011 1546   CL 98 02/09/2010 1156   CO2 22 03/13/2012 0900   CO2 22 12/28/2011 1546   CO2 25 02/09/2010 1156   BUN 44.0* 03/13/2012 0900   BUN 47* 12/28/2011 1546   BUN 36* 02/09/2010 1156   CREATININE 2.1* 03/13/2012 0900   CREATININE 2.88* 12/28/2011 1546   CREATININE 3.65* 11/19/2011 0555      Component Value Date/Time   CALCIUM 9.2 03/13/2012 0900   CALCIUM 9.0 12/28/2011 1546   CALCIUM 8.6 02/09/2010 1156   ALKPHOS 73 11/07/2011 0915   ALKPHOS 77 02/09/2010 1156   AST 11 11/07/2011 0915   AST 25 02/09/2010 1156   ALT 9 11/07/2011 0915   BILITOT 0.6 11/07/2011 0915   BILITOT 0.70 02/09/2010 1156       ASSESSMENT: 78 year old gentleman with:  #1 anemia of chronic disease.  #2 Status post oriectomy for a rising PSA for his prostate cancer  #3 recent GI bleed  requiring extensive hospitalization.  #4 on abdominal exam patient was found to have a left-sided mass  PLAN:  #1 patient will not need Aranesp injection today.  #2 he will continue to be seen every month for CBC and Aranesp injections as needed. We will also check his PSA on his next visit.  #3 we will obtain ultrasound of the abdomen to further evaluate the palpable mass     All questions were answered. The patient knows to call the clinic with any problems, questions or concerns. We can certainly see the patient much  sooner if necessary.  I spent 25 minutes counseling the patient face to face. The total time spent in the appointment was 30 minutes.    Drue Second, MD Medical/Oncology [facility] [phone] (beeper) [phone] (Office)

## 2012-05-06 NOTE — Patient Instructions (Addendum)
Doing well  No need for injection today  We will see you back on June 11 2012 with blood work and possible injection

## 2012-05-06 NOTE — Telephone Encounter (Signed)
Gave patient appointment for 06-11-2012 starting at 8:15am

## 2012-05-09 ENCOUNTER — Other Ambulatory Visit: Payer: Self-pay | Admitting: Oncology

## 2012-05-09 ENCOUNTER — Ambulatory Visit (HOSPITAL_COMMUNITY)
Admission: RE | Admit: 2012-05-09 | Discharge: 2012-05-09 | Disposition: A | Discharge status: Home / Self Care | Payer: Medicare Other | Source: Ambulatory Visit | Attending: Oncology | Admitting: Oncology

## 2012-05-09 DIAGNOSIS — R1900 Intra-abdominal and pelvic swelling, mass and lump, unspecified site: Secondary | ICD-10-CM

## 2012-05-09 DIAGNOSIS — R1902 Left upper quadrant abdominal swelling, mass and lump: Secondary | ICD-10-CM | POA: Insufficient documentation

## 2012-05-09 DIAGNOSIS — N1330 Unspecified hydronephrosis: Secondary | ICD-10-CM | POA: Insufficient documentation

## 2012-05-09 DIAGNOSIS — Q619 Cystic kidney disease, unspecified: Secondary | ICD-10-CM | POA: Insufficient documentation

## 2012-05-12 ENCOUNTER — Ambulatory Visit: Payer: Medicare Other

## 2012-05-12 ENCOUNTER — Other Ambulatory Visit: Payer: Medicare Other | Admitting: Lab

## 2012-05-18 LAB — REMOTE PACEMAKER DEVICE: RV LEAD IMPEDENCE PM: 336 Ohm

## 2012-05-27 ENCOUNTER — Encounter: Payer: Self-pay | Admitting: *Deleted

## 2012-06-11 ENCOUNTER — Ambulatory Visit: Payer: Medicare Other | Admitting: Adult Health

## 2012-06-11 ENCOUNTER — Other Ambulatory Visit: Payer: Self-pay | Admitting: Lab

## 2012-06-11 ENCOUNTER — Ambulatory Visit: Payer: Medicare Other

## 2012-06-12 ENCOUNTER — Ambulatory Visit: Payer: Medicare Other | Admitting: Adult Health

## 2012-06-12 ENCOUNTER — Other Ambulatory Visit: Payer: Medicare Other | Admitting: Lab

## 2012-06-16 ENCOUNTER — Telehealth: Payer: Self-pay | Admitting: Oncology

## 2012-06-16 ENCOUNTER — Encounter: Payer: Self-pay | Admitting: Cardiology

## 2012-06-16 ENCOUNTER — Other Ambulatory Visit (INDEPENDENT_AMBULATORY_CARE_PROVIDER_SITE_OTHER): Payer: Medicare Other

## 2012-06-16 ENCOUNTER — Ambulatory Visit (INDEPENDENT_AMBULATORY_CARE_PROVIDER_SITE_OTHER): Payer: Medicare Other | Admitting: Cardiology

## 2012-06-16 VITALS — BP 130/67 | HR 72 | Resp 18 | Ht 68.0 in | Wt 239.0 lb

## 2012-06-16 DIAGNOSIS — Z950 Presence of cardiac pacemaker: Secondary | ICD-10-CM

## 2012-06-16 DIAGNOSIS — I509 Heart failure, unspecified: Secondary | ICD-10-CM

## 2012-06-16 DIAGNOSIS — I5022 Chronic systolic (congestive) heart failure: Secondary | ICD-10-CM

## 2012-06-16 DIAGNOSIS — I495 Sick sinus syndrome: Secondary | ICD-10-CM

## 2012-06-16 DIAGNOSIS — R0989 Other specified symptoms and signs involving the circulatory and respiratory systems: Secondary | ICD-10-CM

## 2012-06-16 DIAGNOSIS — I4891 Unspecified atrial fibrillation: Secondary | ICD-10-CM

## 2012-06-16 DIAGNOSIS — I259 Chronic ischemic heart disease, unspecified: Secondary | ICD-10-CM

## 2012-06-16 NOTE — Assessment & Plan Note (Signed)
The patient has not been experiencing any symptoms of worsening CHF.  He is not having any paroxysmal nocturnal dyspnea or significant peripheral edema

## 2012-06-16 NOTE — Patient Instructions (Addendum)
Your physician recommends that you continue on your current medications as directed. Please refer to the Current Medication list given to you today.  Follow up April 22 at 11:00 am

## 2012-06-16 NOTE — Progress Notes (Signed)
Jeffery Gibson Date of Birth:  05-07-1929 [facility] [address] [place], [place]  [address] [phone]         Fax   [phone]  History of Present Illness: This pleasant 79 year old gentleman is seen for a scheduled followup office visit. He has a complex past medical history. He has known ischemic heart disease. He is diabetic. He has a history of tachybradycardia syndrome and paroxysmal atrial fibrillation.  He has a pacemaker.  He has a malignant neoplasm of the prostate with metastases and recently underwent orchiectomy at [facility] on January 18, 2012.  However his PSA is still rising slowly and most recently was 8.2 He saw his new family doctor at [facility] recently and he reports that his hemoglobin A1c was 6.5 and his creatinine was 2.3. His potassium was 5.0.  The patient denies any recent chest pain or angina. His weight has been stable. He has not been having any significant peripheral edema.   Current Outpatient Prescriptions  Medication Sig Dispense Refill  . amiodarone (PACERONE) 200 MG tablet TAKE (1/2) TABLET DAILY.  45 tablet  3  . amLODipine (NORVASC) 10 MG tablet Take 10 mg by mouth daily.      . Ascorbic Acid (VITAMIN C PO) Take 1 tablet by mouth daily.        Marland Kitchen aspirin 81 MG tablet Take 81 mg by mouth daily.        . Cholecalciferol (VITAMIN D) 2000 UNITS tablet Take 2,000 Units by mouth daily.        . Darbepoetin Alfa-Albumin (ARANESP IJ) Inject 1 Syringe as directed See admin instructions. Every 4 weeks as needed for anemia las given 09-2011      . FINASTERIDE PO Take 5 mg by mouth at bedtime.       . furosemide (LASIX) 80 MG tablet Take 40 mg by mouth daily.       . insulin glargine (LANTUS) 100 UNIT/ML injection Inject 15 Units into the skin at bedtime.      . metoprolol (LOPRESSOR) 50 MG tablet Take 50 mg by mouth 2 (two) times daily.      Marland Kitchen oxybutynin (DITROPAN-XL) 10 MG 24 hr tablet Take 10 mg by mouth daily.      Marland Kitchen  PRILOSEC 20 MG capsule TAKE 1-2 CAPSULES DAILY.  180 each  3  . rosuvastatin (CRESTOR) 10 MG tablet See admin instructions. Take 1/2 tab daily.      . Tamsulosin HCl (FLOMAX) 0.4 MG CAPS Take 0.4 mg by mouth daily after supper.       . ursodiol (ACTIGALL) 300 MG capsule Take 300 mg by mouth 2 (two) times daily.         Allergies  Allergen Reactions  . Bextra (Valdecoxib) Diarrhea  . Latex Hives  . Lovenox (Enoxaparin Sodium)     Gi bleed  . Lupron (Leuprolide Acetate)     Kidney failure  . Metformin And Related Nausea Only  . Mevacor (Lovastatin) Nausea And Vomiting  . Ramipril Cough  . Warfarin And Related     Gi bleed    Patient Active Problem List  Diagnosis  . PROSTATE CANCER  . ADENOMATOUS COLONIC POLYP  . DM  . HYPERLIPIDEMIA  . HYPERTENSION  . MYOCARDIAL INFARCTION  . CAD  . Atrial fibrillation  . BRADYCARDIA-TACHYCARDIA SYNDROME  . SUPRAVENTRICULAR TACHYCARDIA  . RENAL INSUFFICIENCY  . SYNCOPE  . Diabetes mellitus  . Anemia associated with chronic renal failure  .  Dyspnea  . Rectus sheath hematoma  . Acute blood loss anemia  . Hypotension  . Coagulopathy  . Diabetes mellitus  . CAD (coronary artery disease)  . HTN (hypertension)  . Paroxysmal a-fib  . UTI (lower urinary tract infection)  . AKI (acute kidney injury)    History  Smoking status  . Former Smoker  . Types: Cigarettes  . Quit date: 05/22/1963  Smokeless tobacco  . Never Used    History  Alcohol Use No    Family History  Problem Relation Age of Onset  . Heart failure Father 58  . Gallbladder disease Father   . COPD    . Coronary artery disease    . Emphysema      Review of Systems: Constitutional: no fever chills diaphoresis or fatigue or change in weight.  Head and neck: no hearing loss, no epistaxis, no photophobia or visual disturbance. Respiratory: No cough, shortness of breath or wheezing. Cardiovascular: No chest pain peripheral edema, palpitations. Gastrointestinal:  No abdominal distention, no abdominal pain, no change in bowel habits hematochezia or melena. Genitourinary: No dysuria, no frequency, no urgency, no nocturia. Musculoskeletal:No arthralgias, no back pain, no gait disturbance or myalgias. Neurological: No dizziness, no headaches, no numbness, no seizures, no syncope, no weakness, no tremors. Hematologic: No lymphadenopathy, no easy bruising. Psychiatric: No confusion, no hallucinations, no sleep disturbance.    Physical Exam: Filed Vitals:   06/16/12 1032  BP: 130/67  Pulse: 72  Resp: 18   dental appearance reveals a well-developed obese gentleman in no acute distress.  He is slightly chronically pale.The head and neck exam reveals pupils equal and reactive.  Extraocular movements are full.  There is no scleral icterus.  The mouth and pharynx are normal.  The neck is supple.  The carotids reveal no bruits.  The jugular venous pressure is normal.  The  thyroid is not enlarged.  There is no lymphadenopathy.  The chest is clear to percussion and auscultation.  There are no rales or rhonchi.  Expansion of the chest is symmetrical.  The precordium is quiet.  The first heart sound is normal.  The second heart sound is physiologically split.  There is no murmur gallop rub or click.  There is no abnormal lift or heave.  The abdomen is soft and nontender.  The bowel sounds are normal.  The liver and spleen are not enlarged.  There are no abdominal masses.  There are no abdominal bruits.  Extremities reveal good pedal pulses.  There is no phlebitis or edema.  There is no cyanosis or clubbing.  Strength is normal and symmetrical in all extremities.  There is no lateralizing weakness.  There are no sensory deficits.  The skin is warm and dry.  There is no rash.     Assessment / Plan: Continue same medication.  Recheck in 3 months for followup office visit and EKG.  The patient is not on an ace or ARB because of his renal insufficiency.  He already has an  appointment to see his nephrologist Dr. Caryn Section in February. Recheck here in 3 months for followup office visit and EKG.  His PCP is presently treating him for a suspected urinary tract infection with a seven-day course of Levaquin.

## 2012-06-16 NOTE — Assessment & Plan Note (Signed)
The patient has a past history of paroxysmal atrial fibrillation.  He is maintaining normal sinus rhythm.  He is not on warfarin because of previous problems with excessive bleeding

## 2012-06-16 NOTE — Telephone Encounter (Signed)
Call pt wife back re r/s 1/23 appt. Per wife pt already has another appt 2/20. Wife made aware that this is lb/inj only and f/u from 1/23 needs to be r/s. Added f/u to 2/20 lb/inj and wife given new time for 2/20 @ 10:45am (lb/fu/inj).

## 2012-06-16 NOTE — Assessment & Plan Note (Signed)
The patient has a past history of tachybradycardia syndrome.  He has a functioning dual-chamber pacemaker.  He is maintaining normal sinus rhythm.  He has not been aware of any palpitations.

## 2012-06-25 ENCOUNTER — Other Ambulatory Visit (HOSPITAL_COMMUNITY): Payer: Self-pay | Admitting: Nephrology

## 2012-06-25 DIAGNOSIS — N184 Chronic kidney disease, stage 4 (severe): Secondary | ICD-10-CM

## 2012-07-02 ENCOUNTER — Encounter (HOSPITAL_COMMUNITY): Payer: Medicare Other

## 2012-07-08 ENCOUNTER — Telehealth: Payer: Self-pay | Admitting: Cardiology

## 2012-07-08 NOTE — Telephone Encounter (Signed)
They need clinical notes faxed over regarding blood sugar testing. The insurance company is requesting this

## 2012-07-09 ENCOUNTER — Other Ambulatory Visit (HOSPITAL_COMMUNITY): Payer: Self-pay | Admitting: Internal Medicine

## 2012-07-09 ENCOUNTER — Other Ambulatory Visit: Payer: Self-pay | Admitting: Medical Oncology

## 2012-07-09 ENCOUNTER — Telehealth: Payer: Self-pay | Admitting: Medical Oncology

## 2012-07-09 DIAGNOSIS — R972 Elevated prostate specific antigen [PSA]: Secondary | ICD-10-CM

## 2012-07-09 IMAGING — CR DG CHEST 2V
2 series · 2 of 2 positions shown · non-contrast
Comparison: 11/01/2011; 03/02/2008; 02/20/2007; chest CT -
06/12/2011

CLINICAL DATA: Abnormal chest radiograph

CHEST - 2 VIEW

[PA]
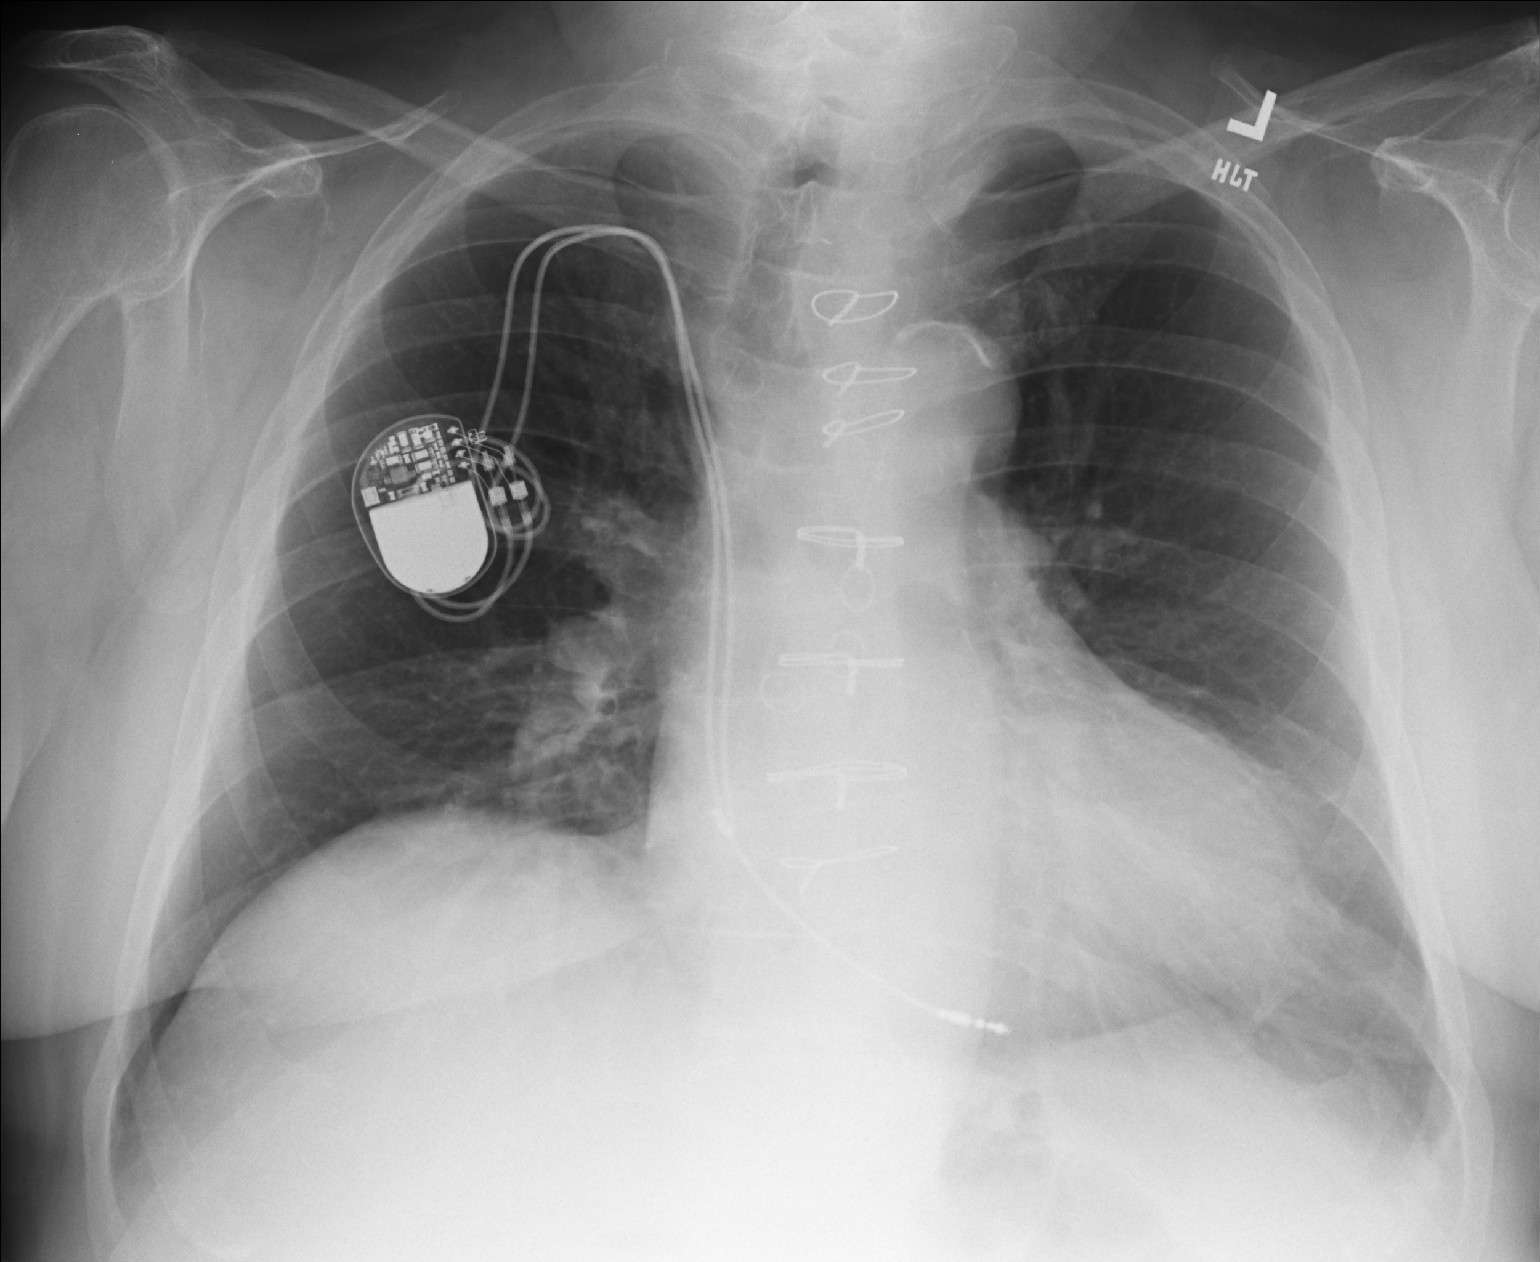

[lateral]
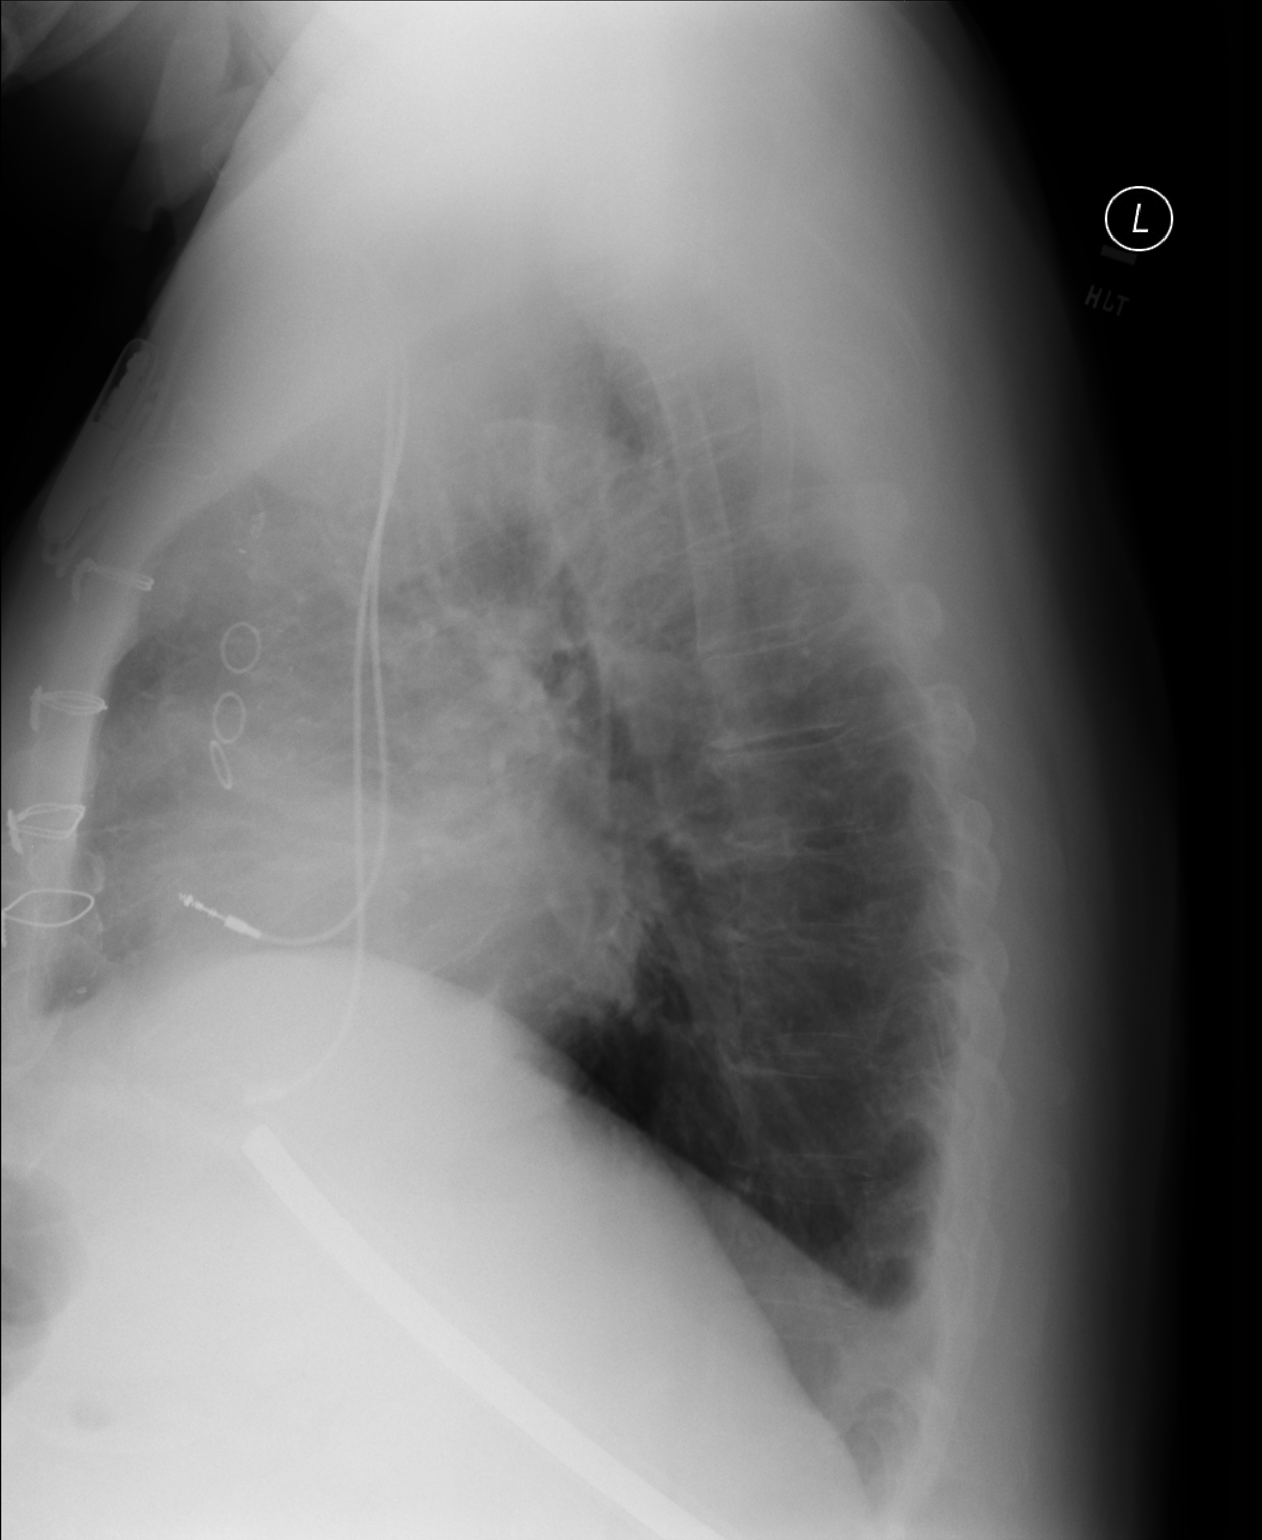

[2 of 2 positions shown; findings below may reference images not displayed]

FINDINGS: Grossly unchanged enlarged cardiac silhouette and
mediastinal contours with mild tortuosity within the thoracic
aorta.  Thoracic aorta calcifications.  Post median sternotomy
CABG.  Stable positioning of support apparatus.

Persistent left infrahilar heterogeneous possible airspace
opacities.  The previously identified 9 mm nodule within the right
lower lobe seen on prior chest CT is not well depicted on this
examination.  The lungs are hyperinflated with flattening of
bilateral hemidiaphragms.  There is chronic blunting of the right
costophrenic angle without definite pleural effusion.  No
pneumothorax.  Grossly unchanged bones.
IMPRESSION: Grossly unchanged left infrahilar heterogeneous air space
opacities, worrisome for infection.  Given the stability of this
finding as well as the known 1 cm nodule within the right lower
lobe (not well depicted on this examination but seen on recent
chest CT from 06/12/2011), further evaluation with chest CT is
recommended.

These results will be called to the ordering clinician or
representative by the Radiologist Assistant, and communication
documented in the PACS Dashboard.

## 2012-07-09 NOTE — Telephone Encounter (Signed)
Pt's wife calling wanting to comfirm pt's appt for 02/20 and to confirm that Dr Welton Flakes continues to be pt's MD. Pt's wife also wanted to report that his PCP has ordered a "total bone scan because his PSA went from 8.4 to 10.46." Confirmed with patient that Dr Welton Flakes continues to be his Oncologist and is monitoring his care. Wife expressed gratitude. No further questions at this time.

## 2012-07-10 ENCOUNTER — Ambulatory Visit (HOSPITAL_BASED_OUTPATIENT_CLINIC_OR_DEPARTMENT_OTHER): Payer: Medicare Other | Admitting: Adult Health

## 2012-07-10 ENCOUNTER — Other Ambulatory Visit: Payer: Medicare Other

## 2012-07-10 ENCOUNTER — Ambulatory Visit: Payer: Medicare Other

## 2012-07-10 ENCOUNTER — Other Ambulatory Visit: Payer: Self-pay | Admitting: Lab

## 2012-07-10 ENCOUNTER — Telehealth: Payer: Self-pay | Admitting: Oncology

## 2012-07-10 ENCOUNTER — Ambulatory Visit (HOSPITAL_BASED_OUTPATIENT_CLINIC_OR_DEPARTMENT_OTHER): Payer: Medicare Other

## 2012-07-10 ENCOUNTER — Encounter: Payer: Self-pay | Admitting: Adult Health

## 2012-07-10 VITALS — BP 115/68 | HR 80 | Temp 98.0°F | Resp 20 | Ht 68.0 in | Wt 242.7 lb

## 2012-07-10 DIAGNOSIS — N189 Chronic kidney disease, unspecified: Secondary | ICD-10-CM

## 2012-07-10 LAB — CBC WITH DIFFERENTIAL/PLATELET
Eosinophils Absolute: 0.2 10*3/uL (ref 0.0–0.5)
HCT: 28.4 % — ABNORMAL LOW (ref 38.4–49.9)
LYMPH%: 8.2 % — ABNORMAL LOW (ref 14.0–49.0)
MONO#: 0.7 10*3/uL (ref 0.1–0.9)
NEUT#: 6.4 10*3/uL (ref 1.5–6.5)
NEUT%: 79.7 % — ABNORMAL HIGH (ref 39.0–75.0)
Platelets: 137 10*3/uL — ABNORMAL LOW (ref 140–400)
WBC: 8.1 10*3/uL (ref 4.0–10.3)
lymph#: 0.7 10*3/uL — ABNORMAL LOW (ref 0.9–3.3)

## 2012-07-10 LAB — COMPREHENSIVE METABOLIC PANEL (CC13)
AST: 9 U/L (ref 5–34)
Albumin: 3.3 g/dL — ABNORMAL LOW (ref 3.5–5.0)
Alkaline Phosphatase: 92 U/L (ref 40–150)
BUN: 41.4 mg/dL — ABNORMAL HIGH (ref 7.0–26.0)
Glucose: 247 mg/dl — ABNORMAL HIGH (ref 70–99)
Potassium: 4.2 mEq/L (ref 3.5–5.1)
Sodium: 138 mEq/L (ref 136–145)
Total Bilirubin: 0.43 mg/dL (ref 0.20–1.20)

## 2012-07-10 LAB — IRON AND TIBC
Iron: 96 ug/dL (ref 42–165)
TIBC: 256 ug/dL (ref 215–435)
UIBC: 160 ug/dL (ref 125–400)

## 2012-07-10 LAB — PSA: PSA: 11.72 ng/mL — ABNORMAL HIGH (ref <=4.00)

## 2012-07-10 MED ORDER — DARBEPOETIN ALFA-POLYSORBATE 500 MCG/ML IJ SOLN
300.0000 ug | Freq: Once | INTRAMUSCULAR | Status: AC
  Administered 2012-07-10: 300 ug via SUBCUTANEOUS
  Filled 2012-07-10: qty 1

## 2012-07-10 NOTE — Progress Notes (Signed)
OFFICE PROGRESS NOTE  CC: Dr. Almetta Gibson, Jeffery Picket, MD [address] [address]  DIAGNOSIS: 79 year old gentleman with  #1 stage II adenocarcinoma of the colon originally diagnosed in July 2005 on observation.  #2 prostate cancer status post brachii therapy is currently receiving Lupron every 6 month is followed by Dr. Luane Gibson at [facility].   #3 Anemia secondary to renal insufficiency and chronic disease on Aranesp injections 300 mg every 3 weeks to keep her hemoglobin at or above 11 g  CURRENT THERAPY: Aranesp 300 mg every 3 week,   INTERVAL HISTORY: Jeffery Gibson 79 y.o. male returns for followup visit. He is doing well today.  He is concerned regarding his PSA that went from around 8 to around 10.  His PCP is concerned as well and he has an appointment with [facility] next week along with a bone scan.  Additionally, he is seeing Dr. Caryn Gibson who has ordered a lasix renal scan.  He is fatigued, and he is due for his Aranesp today.    MEDICAL HISTORY: Past Medical History  Diagnosis Date  . Ischemic heart disease 05/06/06    post CARG 05/06/06  . Atrial flutter     afib and atrial flutter (permanent)  . Tachycardia-bradycardia 1997    dual-chamber/for tachybradycardia syndrome  . Obese     exogenous  . Renal insufficiency   . Anemia of chronic disease     aranesp injections  . Dyslipidemia   . Coronary artery disease   . Anemia associated with chronic renal failure 04/05/2011  . Cancer     prostate/on Lupron  . Adenocarcinoma of colon 11/2003    stage 2(T3,N0,M0)  . Diabetes mellitus   . Hypertension     ALLERGIES:  is allergic to bextra; latex; lovenox; lupron; metformin and related; mevacor; ramipril; and warfarin and related.  MEDICATIONS:  Current Outpatient Prescriptions  Medication Sig Dispense Refill  . amiodarone (PACERONE) 200 MG tablet TAKE (1/2) TABLET DAILY.  45 tablet  3  . amLODipine (NORVASC)  10 MG tablet Take 10 mg by mouth daily.      . Ascorbic Acid (VITAMIN C PO) Take 1 tablet by mouth daily.        Marland Kitchen aspirin 81 MG tablet Take 81 mg by mouth daily.        . Cholecalciferol (VITAMIN D) 2000 UNITS tablet Take 2,000 Units by mouth daily.        . Darbepoetin Alfa-Albumin (ARANESP IJ) Inject 1 Syringe as directed See admin instructions. Every 4 weeks as needed for anemia las given 09-2011      . FINASTERIDE PO Take 5 mg by mouth at bedtime.       . furosemide (LASIX) 80 MG tablet Take 40 mg by mouth daily.       . insulin glargine (LANTUS) 100 UNIT/ML injection Inject 15 Units into the skin at bedtime.      . metoprolol (LOPRESSOR) 50 MG tablet Take 50 mg by mouth 2 (two) times daily.      Marland Kitchen oxybutynin (DITROPAN-XL) 10 MG 24 hr tablet Take 10 mg by mouth daily.      Marland Kitchen PRILOSEC 20 MG capsule TAKE 1-2 CAPSULES DAILY.  180 each  3  . rosuvastatin (CRESTOR) 10 MG tablet See admin instructions. Take 1/2 tab daily.      . Tamsulosin HCl (FLOMAX) 0.4 MG CAPS Take 0.4 mg by mouth daily after supper.       Marland Kitchen  ursodiol (ACTIGALL) 300 MG capsule Take 300 mg by mouth 2 (two) times daily.        No current facility-administered medications for this visit.    SURGICAL HISTORY:  Past Surgical History  Procedure Laterality Date  . Laparotomy  12/04/2003    resection of rectosigmoid carcinoma/  . Radioactive seed implant  05/2005    transperianeal placement I-125 for prostate cancer/# of  seeds 55  . Cardiac catheterization  05/01/06    EF 40%/diffuse 3 vessel CAD/tight L antereior descending artery stenosis/diffuse disease proximal L anterior descending  . Coronary artery bypass graft  05/06/06    x4 with L internal mammary artery to the L anterior descending coronary artery  . Pacemaker removal  1997    REVIEW OF SYSTEMS:   General: fatigue (+), night sweats (-), fever (-), pain (-) Lymph: palpable nodes (-) HEENT: vision changes (-), mucositis (-), gum bleeding (-), epistaxis  (-) Cardiovascular: chest pain (-), palpitations (-) Pulmonary: shortness of breath (-), dyspnea on exertion (-), cough (-), hemoptysis (-) GI:  Early satiety (-), melena (-), dysphagia (-), nausea/vomiting (-), diarrhea (-) GU: dysuria (-), hematuria (-), incontinence (-) Musculoskeletal: joint swelling (-), joint pain (-), back pain (-) Neuro: weakness (-), numbness (-), headache (-), confusion (-) Skin: Rash (-), lesions (-), dryness (-) Psych: depression (-), suicidal/homicidal ideation (-), feeling of hopelessness (-)   PHYSICAL EXAMINATION: BP 115/68  Pulse 80  Temp(Src) 98 F (36.7 C) (Oral)  Resp 20  Ht 5\' 8"  (1.727 m)  Wt 242 lb 11.2 oz (110.088 kg)  BMI 36.91 kg/m2 General: Patient is a well appearing male in no acute distress HEENT: PERRLA, sclerae anicteric no conjunctival pallor, MMM Neck: supple, no palpable adenopathy Lungs: clear to auscultation bilaterally, no wheezes, rhonchi, or rales Cardiovascular: regular rate rhythm, S1, S2, no murmurs, rubs or gallops Abdomen: Soft, non-tender, non-distended, normoactive bowel sounds, no HSM Extremities: warm and well perfused, no clubbing, cyanosis, or edema Skin: No rashes or lesions Neuro: Non-focal ECOG PERFORMANCE STATUS: 1 - Symptomatic but completely ambulatory  LABORATORY DATA: Lab Results  Component Value Date   WBC 8.1 07/10/2012   HGB 9.9* 07/10/2012   HCT 28.4* 07/10/2012   MCV 94.3 07/10/2012   PLT 137* 07/10/2012      Chemistry      Component Value Date/Time   NA 138 07/10/2012 1115   NA 135 12/28/2011 1546   NA 141 02/09/2010 1156   K 4.2 07/10/2012 1115   K 4.8 12/28/2011 1546   K 4.5 02/09/2010 1156   CL 104 07/10/2012 1115   CL 102 12/28/2011 1546   CL 98 02/09/2010 1156   CO2 22 07/10/2012 1115   CO2 22 12/28/2011 1546   CO2 25 02/09/2010 1156   BUN 41.4* 07/10/2012 1115   BUN 47* 12/28/2011 1546   BUN 36* 02/09/2010 1156   CREATININE 2.6* 07/10/2012 1115   CREATININE 2.88* 12/28/2011 1546   CREATININE 3.65*  11/19/2011 0555      Component Value Date/Time   CALCIUM 9.0 07/10/2012 1115   CALCIUM 9.0 12/28/2011 1546   CALCIUM 8.6 02/09/2010 1156   ALKPHOS 92 07/10/2012 1115   ALKPHOS 73 11/07/2011 0915   ALKPHOS 77 02/09/2010 1156   AST 9 07/10/2012 1115   AST 11 11/07/2011 0915   AST 25 02/09/2010 1156   ALT 6 07/10/2012 1115   ALT 9 11/07/2011 0915   BILITOT 0.43 07/10/2012 1115   BILITOT 0.6 11/07/2011 0915   BILITOT 0.70  02/09/2010 1156       ASSESSMENT: 79 year old gentleman with:  #1 anemia of chronic disease.  #2 Status post oriectomy for a rising PSA for his prostate cancer  #3 recent GI bleed requiring extensive hospitalization.  #4 on abdominal exam patient was found to have a left-sided mass  PLAN:  #1 patient will proceed with Aranesp injection today.  #2 he will continue to be seen every month for CBC and Aranesp injections as needed.   #3 We will see him back in 2 months for an appointment.   All questions were answered. The patient knows to call the clinic with any problems, questions or concerns. We can certainly see the patient much sooner if necessary.  I spent 25 minutes counseling the patient face to face. The total time spent in the appointment was 30 minutes.  Cherie Ouch Lyn Hollingshead, NP Medical Oncology [facility] Phone: [phone]

## 2012-07-10 NOTE — Patient Instructions (Addendum)
Doing well.  We will see you back in three weeks for your next injection and in about 2 months for f/u with Dr. Welton Flakes. Please call us if you have any questions or concerns.

## 2012-07-10 NOTE — Telephone Encounter (Signed)
Spoke with the pharmacy and they actually do not need any information at this time

## 2012-07-11 ENCOUNTER — Other Ambulatory Visit: Payer: Medicare Other | Admitting: Lab

## 2012-07-11 ENCOUNTER — Encounter (HOSPITAL_COMMUNITY)
Admission: RE | Admit: 2012-07-11 | Discharge: 2012-07-11 | Disposition: A | Discharge status: Home / Self Care | Payer: Medicare Other | Source: Ambulatory Visit | Attending: Nephrology | Admitting: Nephrology

## 2012-07-11 ENCOUNTER — Ambulatory Visit (HOSPITAL_COMMUNITY): Payer: Medicare Other

## 2012-07-11 ENCOUNTER — Encounter (HOSPITAL_COMMUNITY): Payer: Medicare Other

## 2012-07-11 ENCOUNTER — Ambulatory Visit: Payer: Medicare Other

## 2012-07-11 DIAGNOSIS — N1330 Unspecified hydronephrosis: Secondary | ICD-10-CM | POA: Insufficient documentation

## 2012-07-11 DIAGNOSIS — C61 Malignant neoplasm of prostate: Secondary | ICD-10-CM | POA: Insufficient documentation

## 2012-07-11 DIAGNOSIS — N184 Chronic kidney disease, stage 4 (severe): Secondary | ICD-10-CM | POA: Insufficient documentation

## 2012-07-11 MED ORDER — TECHNETIUM TC 99M MERTIATIDE
15.0000 | Freq: Once | INTRAVENOUS | Status: AC | PRN
  Administered 2012-07-11: 15 via INTRAVENOUS

## 2012-07-11 MED ORDER — FUROSEMIDE 10 MG/ML IJ SOLN
55.0000 mg | Freq: Once | INTRAMUSCULAR | Status: DC
  Filled 2012-07-11: qty 6

## 2012-07-12 IMAGING — CR DG ABDOMEN 1V
1 series · 3 of 3 positions shown · non-contrast
Comparison: 11/07/2011.

CLINICAL DATA: Abdominal pain.  Ileus.

ABDOMEN - 1 VIEW

[Series 1: ap (kub) · U · 3 of 3 slices shown]
[im 1/3]
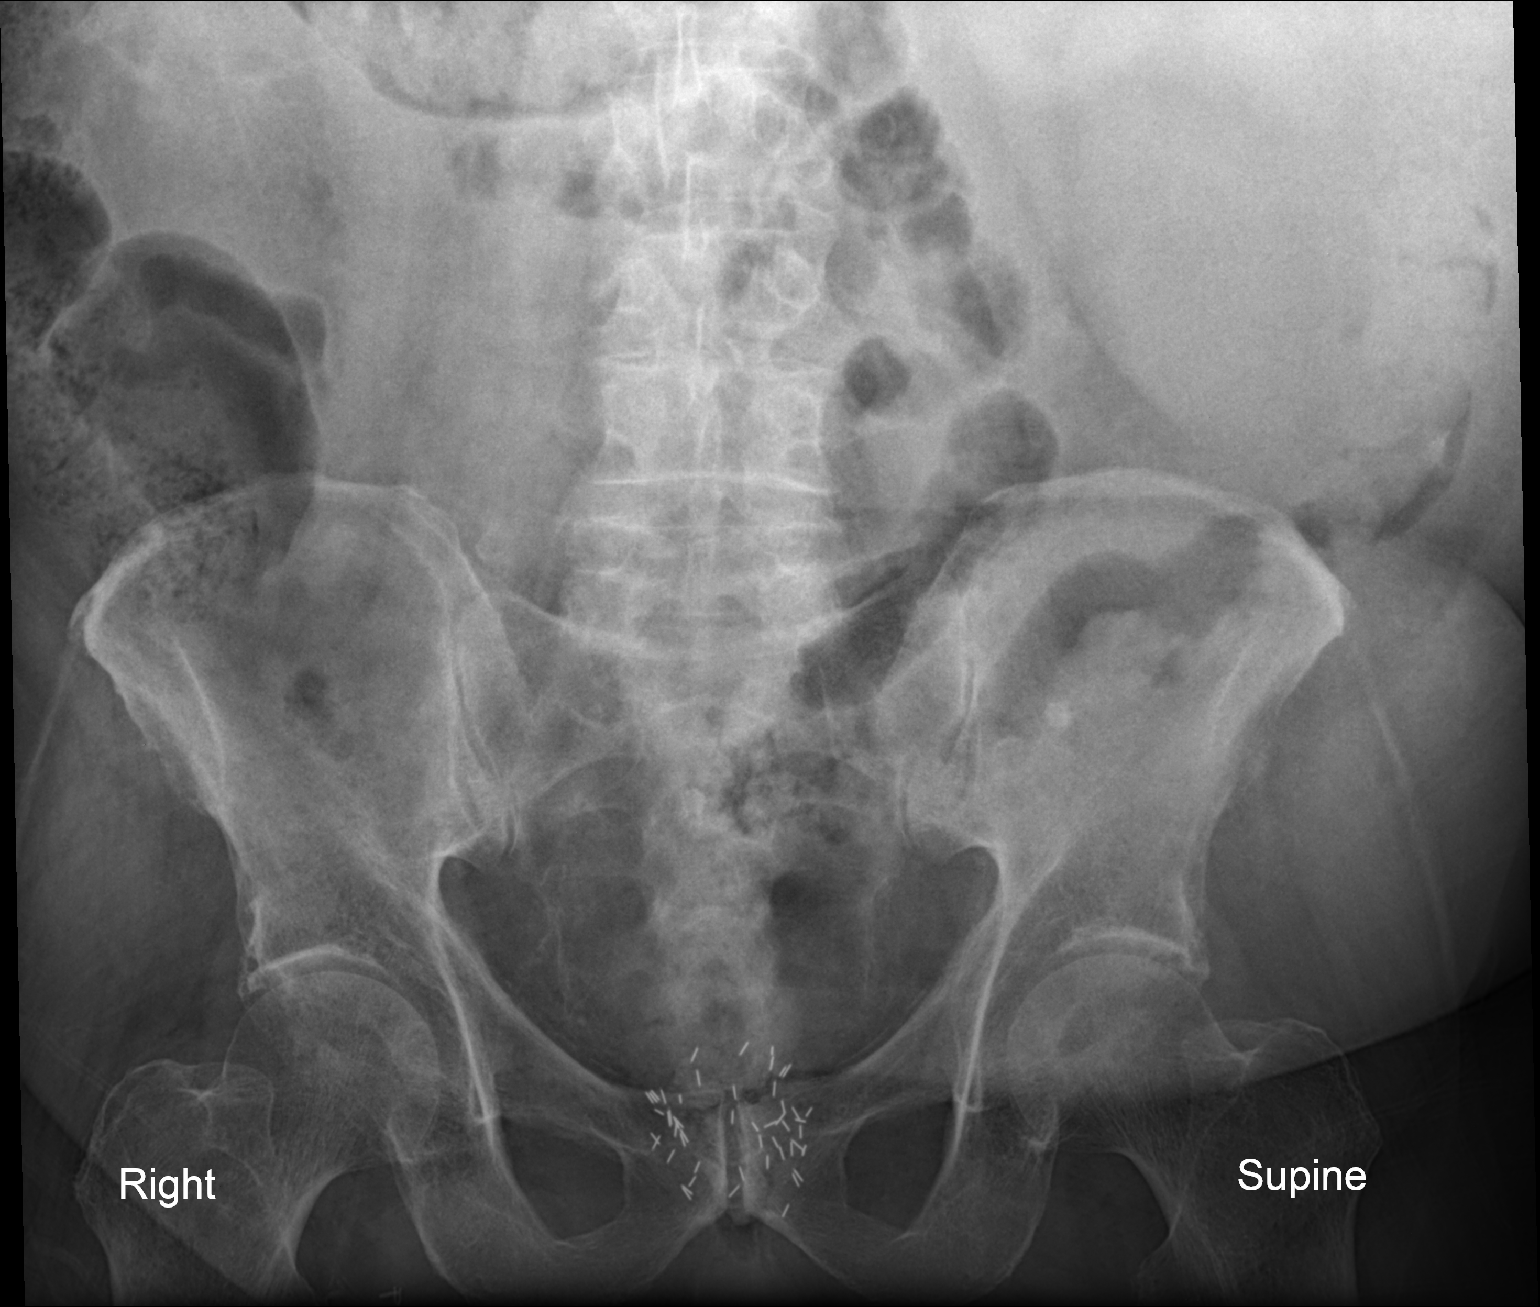
[im 2/3]
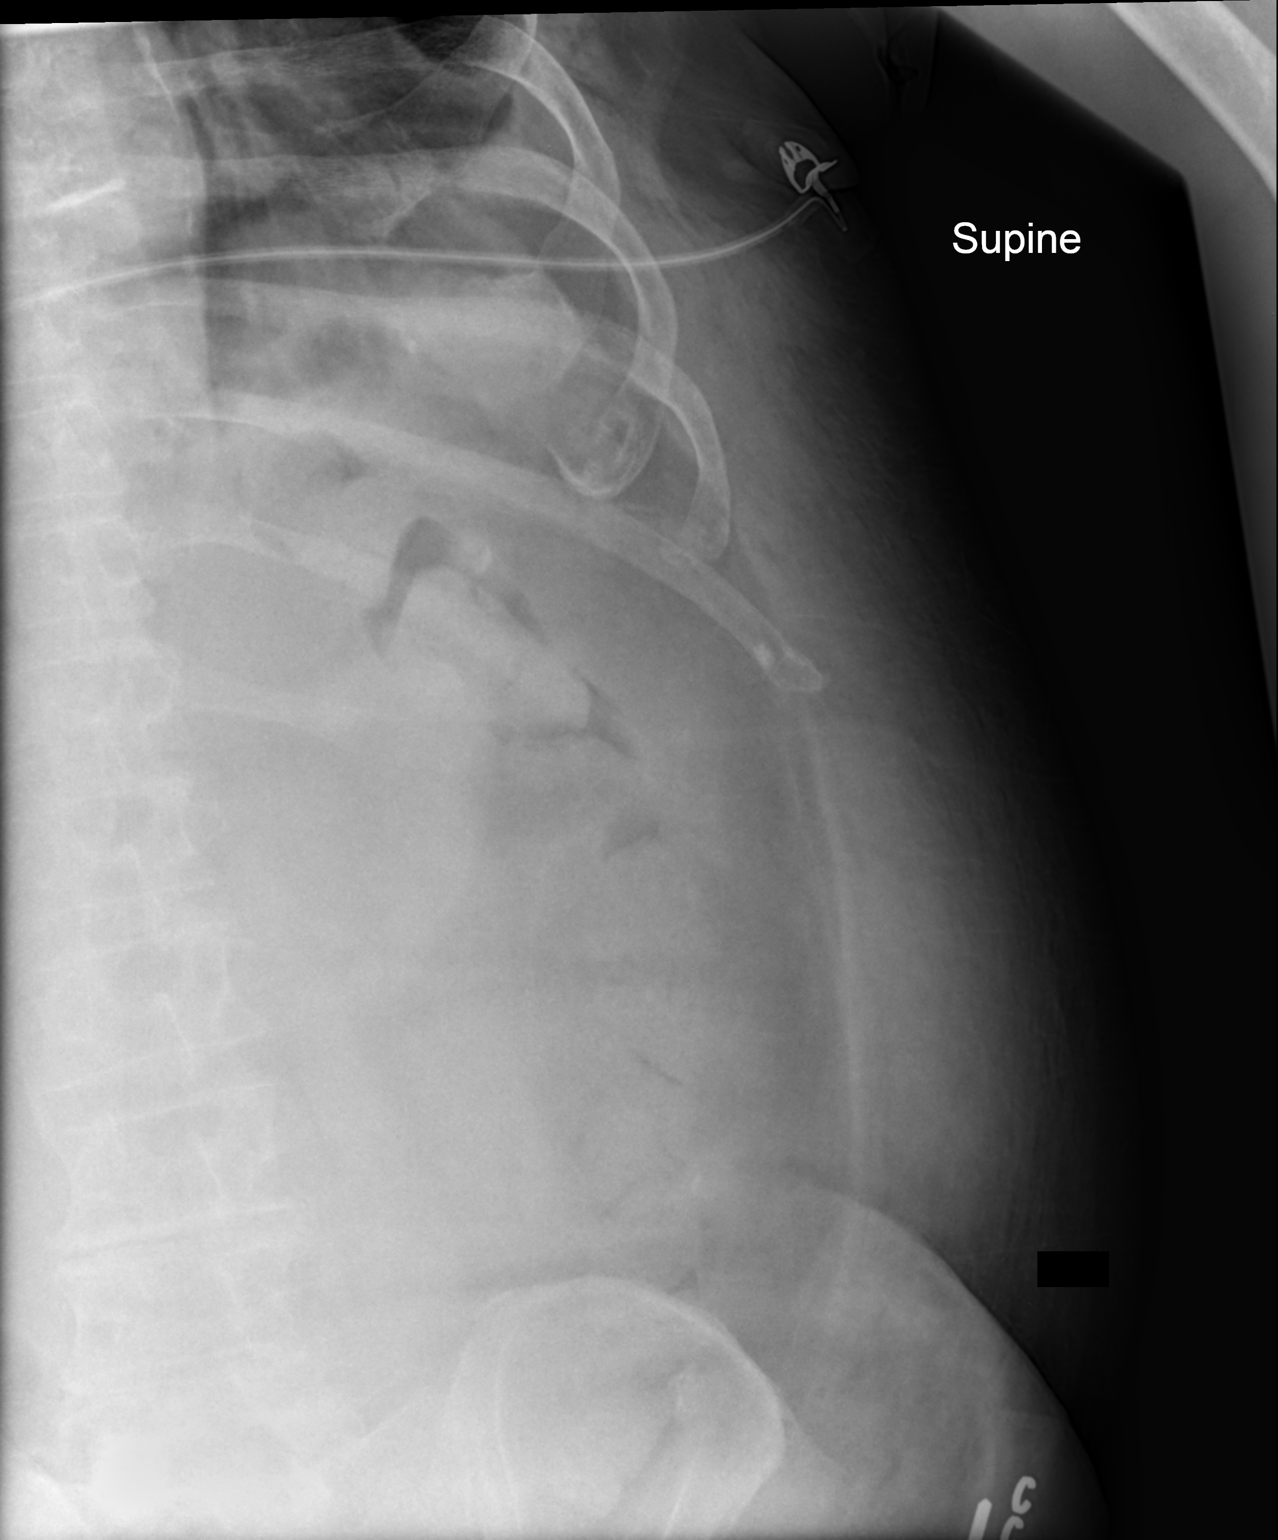
[im 3/3]
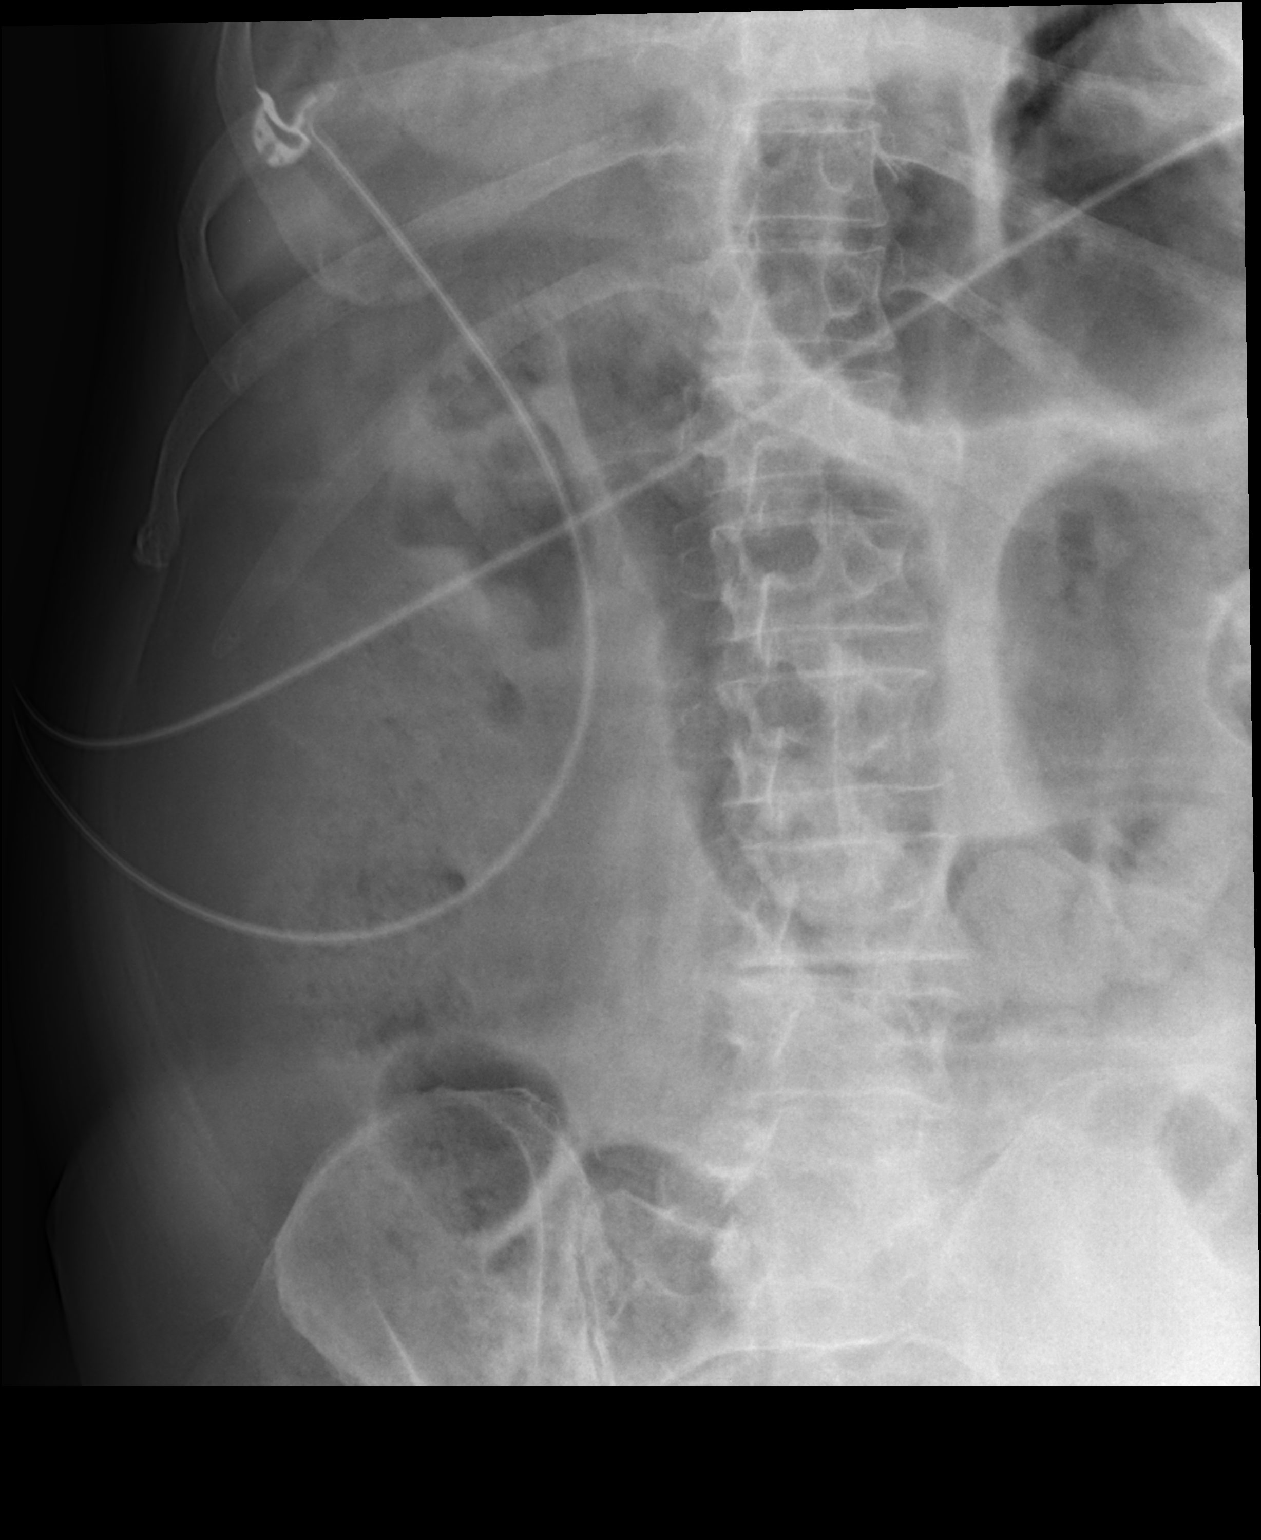

[3 of 3 positions shown; findings below may reference images not displayed]

FINDINGS: Left abdominal wall hematoma is visualized on the supine
radiographs.  There is no dilation of small bowel.  No evidence of
obstruction.  No gross plain film evidence of free air.  Prostate
brachytherapy seeds are present.
IMPRESSION: No evidence of obstruction or ileus.  Left abdominal wall hematoma.

## 2012-07-14 IMAGING — US US RENAL
1 series · 14 of 25 positions shown · non-contrast
Comparison: Unenhanced CT abdomen and pelvis 11/07/2011,
06/10/2011.

CLINICAL DATA: Acute superimposed upon chronic renal insufficiency.

RENAL/URINARY TRACT ULTRASOUND COMPLETE

[Series 1: us renal · 0.32mm/px · 14 of 36 slices shown]
[im 1/36]
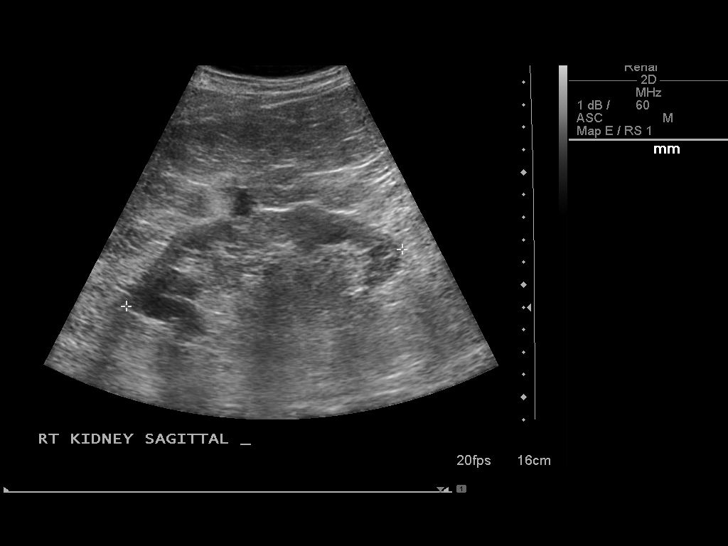
[im 3/36]
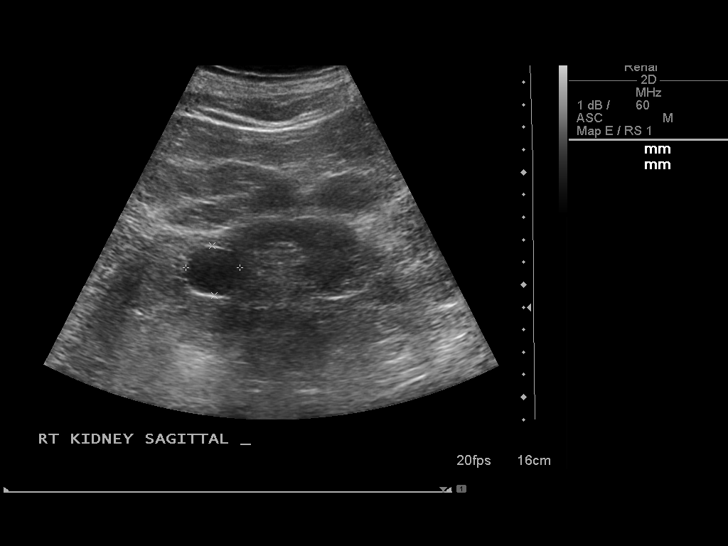
[im 6/36]
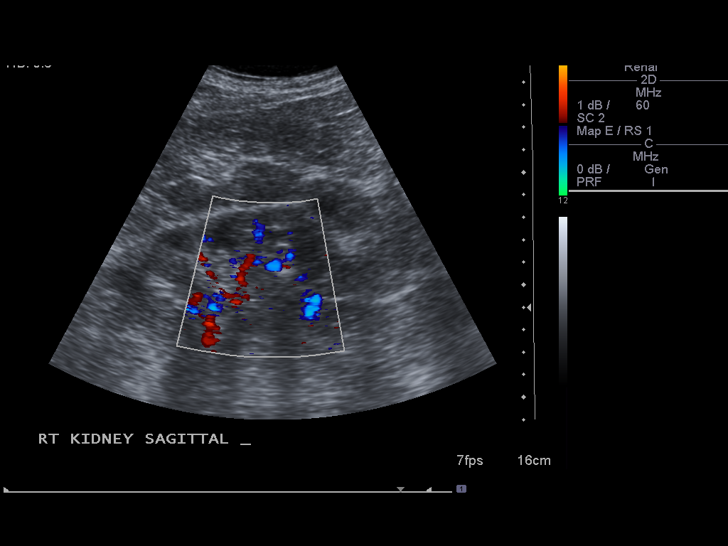
[im 9/36]
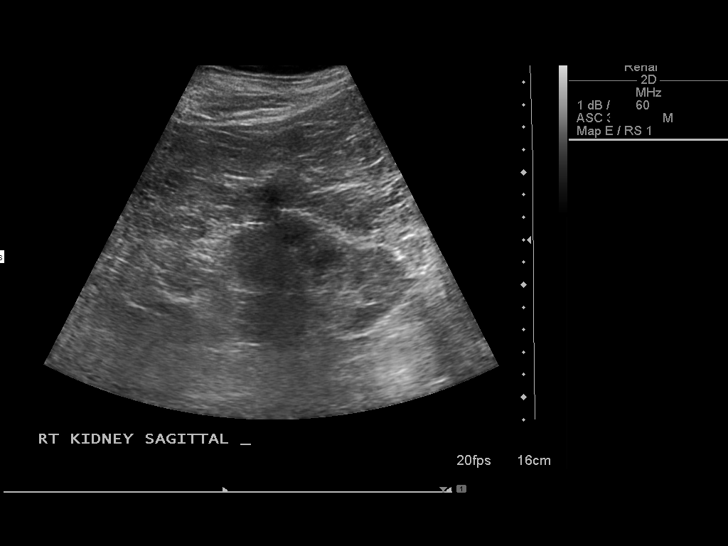
[im 12/36]
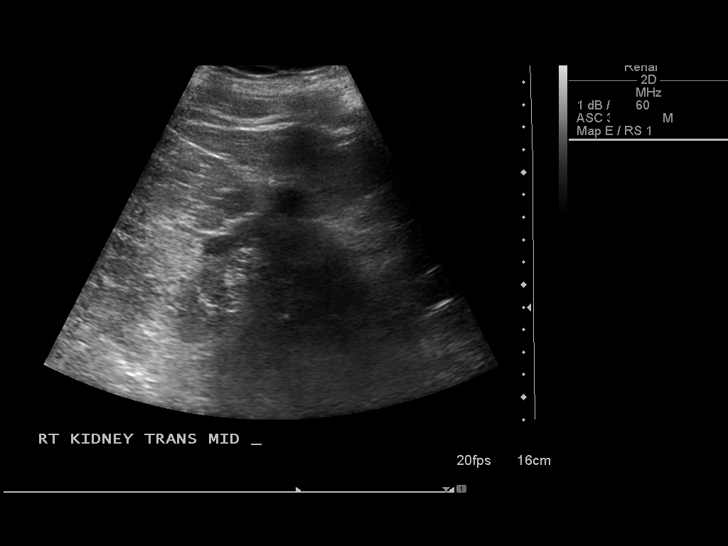
[im 14/36]
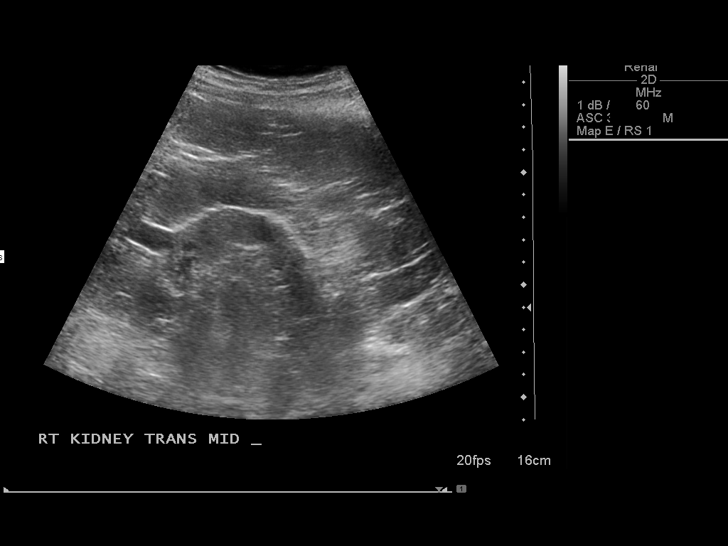
[im 17/36]
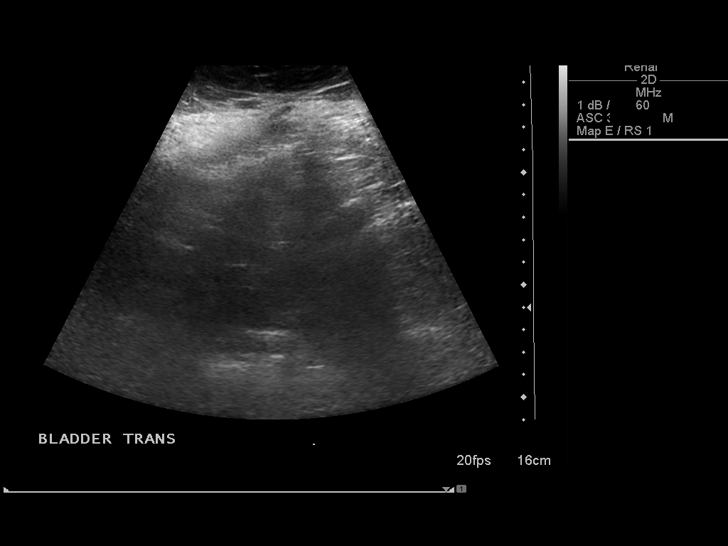
[im 19/36]
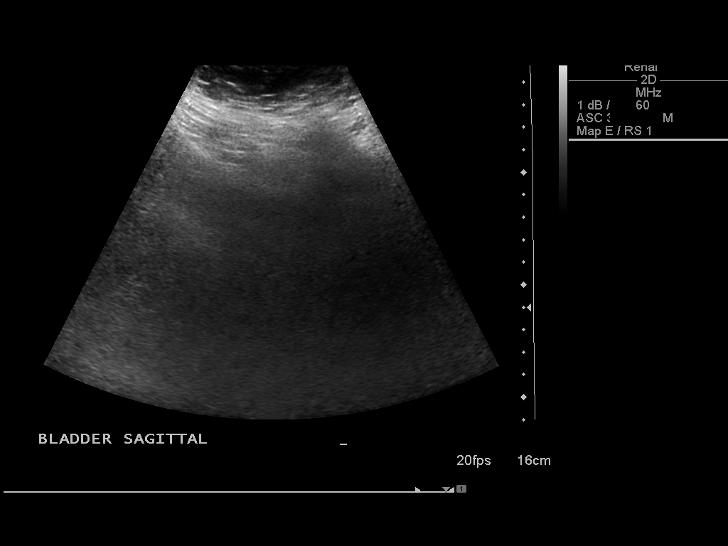
[im 22/36]
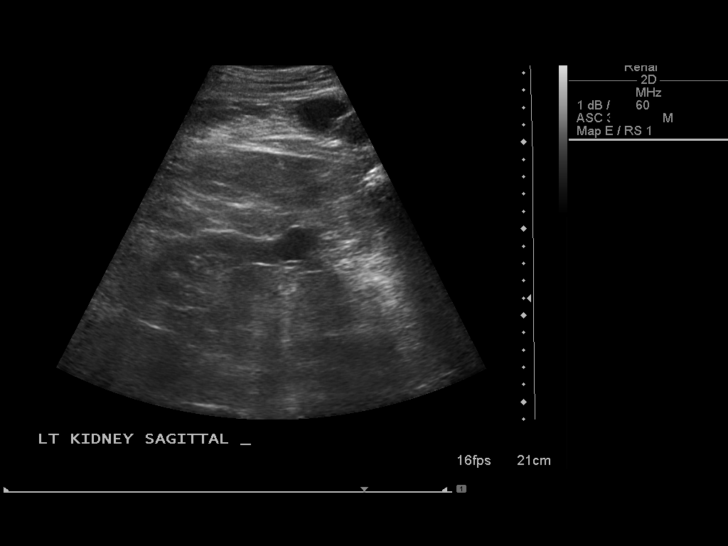
[im 24/36]
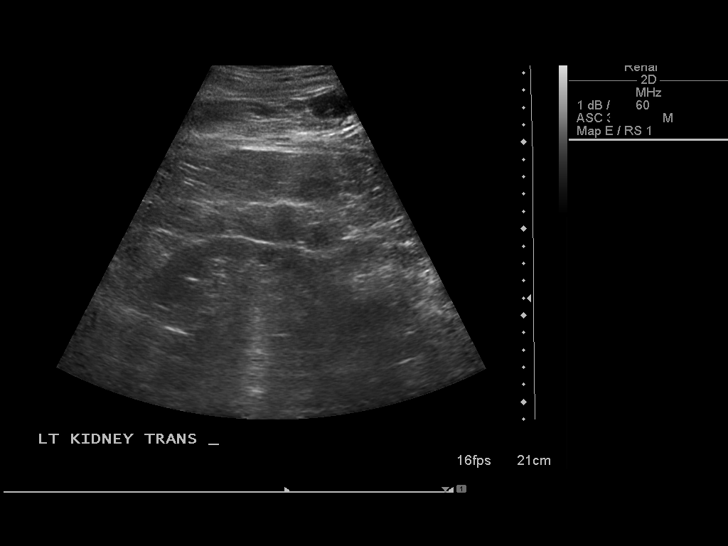
[im 27/36]
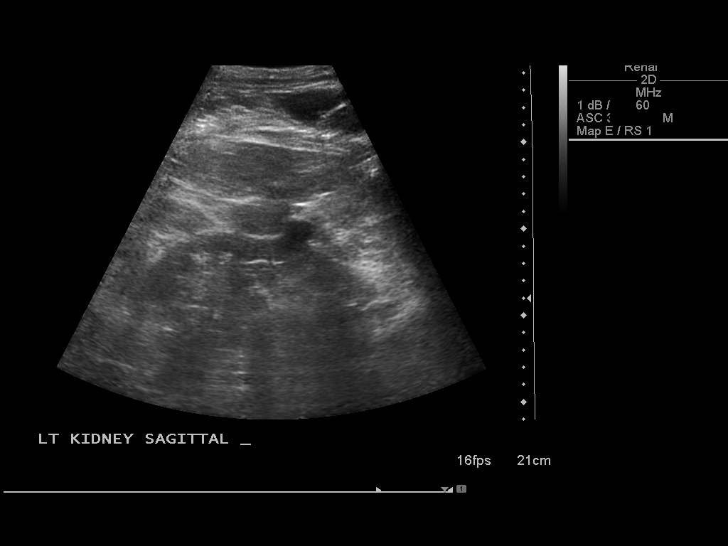
[im 30/36]
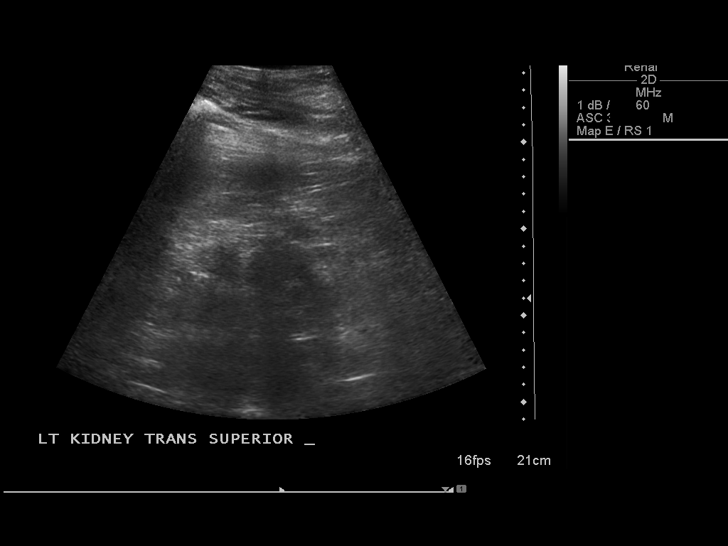
[im 33/36]
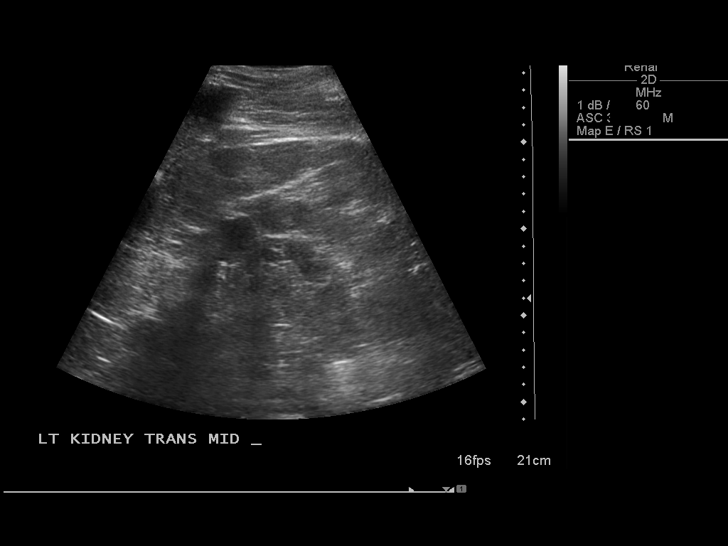
[im 36/36]
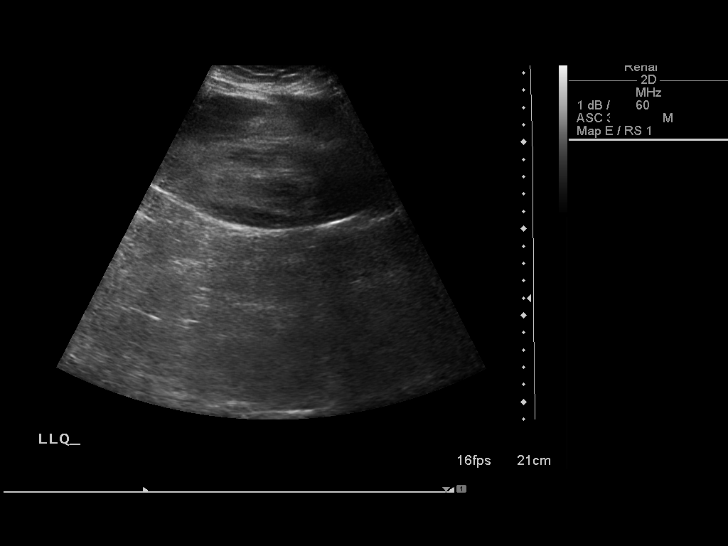

[14 of 25 positions shown; findings below may reference images not displayed]

FINDINGS: Right Kidney:  No hydronephrosis.  Severe diffuse cortical
thinning.  Approximate 2.4 x 2.2 x 2.9 cm cyst arising from the
lower pole and approximate 1.5 x 1.6 x 1.6 cm exophytic cyst
arising from the mid kidney, as noted on the CT.  No solid
parenchymal masses.  No shadowing calculi.  Approximate 12.5 cm in
length.

Left Kidney:  No hydronephrosis.  Severe diffuse cortical thinning.
Approximate to point, 1.9 x 2.2 cm cyst arising from the mid
kidney, as noted on the CT.  No solid parenchymal masses.  No
shadowing calculi.  Approximate 13.4 cm in length.

Bladder:  Decompressed by Foley catheter.

Other findings:  The left rectus sheath hematoma identified on
recent CT was noted during imaging of the kidneys.
IMPRESSION: 1.  No evidence of obstruction involving either kidney.
2.  Severe diffuse cortical thinning bilaterally and bilateral
renal cysts as noted on the recent CT.  No solid renal masses.

## 2012-07-16 ENCOUNTER — Encounter (HOSPITAL_COMMUNITY)
Admission: RE | Admit: 2012-07-16 | Discharge: 2012-07-16 | Disposition: A | Discharge status: Home / Self Care | Payer: Medicare Other | Source: Ambulatory Visit | Attending: Internal Medicine | Admitting: Internal Medicine

## 2012-07-16 DIAGNOSIS — C61 Malignant neoplasm of prostate: Secondary | ICD-10-CM | POA: Insufficient documentation

## 2012-07-16 DIAGNOSIS — R972 Elevated prostate specific antigen [PSA]: Secondary | ICD-10-CM | POA: Insufficient documentation

## 2012-07-16 MED ORDER — TECHNETIUM TC 99M MEDRONATE IV KIT
25.4000 | PACK | Freq: Once | INTRAVENOUS | Status: AC | PRN
  Administered 2012-07-16: 25.4 via INTRAVENOUS

## 2012-07-23 ENCOUNTER — Other Ambulatory Visit: Payer: Self-pay | Admitting: *Deleted

## 2012-07-23 NOTE — Telephone Encounter (Signed)
Error

## 2012-07-24 ENCOUNTER — Other Ambulatory Visit: Payer: Self-pay | Admitting: Urology

## 2012-07-28 ENCOUNTER — Encounter (HOSPITAL_COMMUNITY): Payer: Self-pay | Admitting: Pharmacy Technician

## 2012-07-30 ENCOUNTER — Encounter (HOSPITAL_COMMUNITY): Payer: Self-pay

## 2012-07-30 ENCOUNTER — Encounter (HOSPITAL_COMMUNITY)
Admission: RE | Admit: 2012-07-30 | Discharge: 2012-07-30 | Disposition: A | Discharge status: Home / Self Care | Payer: Medicare Other | Source: Ambulatory Visit | Attending: Urology | Admitting: Urology

## 2012-07-30 HISTORY — DX: Angina pectoris, unspecified: I20.9

## 2012-07-30 HISTORY — DX: Unspecified osteoarthritis, unspecified site: M19.90

## 2012-07-30 HISTORY — DX: Unspecified hemorrhoids: K64.9

## 2012-07-30 HISTORY — DX: Other injury of unspecified body region, initial encounter: T14.8XXA

## 2012-07-30 HISTORY — DX: Gastro-esophageal reflux disease without esophagitis: K21.9

## 2012-07-30 HISTORY — DX: Unspecified cataract: H26.9

## 2012-07-30 HISTORY — DX: Acute myocardial infarction, unspecified: I21.9

## 2012-07-30 HISTORY — DX: Unspecified convulsions: R56.9

## 2012-07-30 HISTORY — DX: Sleep apnea, unspecified: G47.30

## 2012-07-30 LAB — CBC
HCT: 34.1 % — ABNORMAL LOW (ref 39.0–52.0)
Hemoglobin: 12 g/dL — ABNORMAL LOW (ref 13.0–17.0)
MCV: 94.5 fL (ref 78.0–100.0)
RBC: 3.61 MIL/uL — ABNORMAL LOW (ref 4.22–5.81)
RDW: 13.7 % (ref 11.5–15.5)
WBC: 8.6 10*3/uL (ref 4.0–10.5)

## 2012-07-30 LAB — BASIC METABOLIC PANEL
BUN: 52 mg/dL — ABNORMAL HIGH (ref 6–23)
CO2: 25 mEq/L (ref 19–32)
Chloride: 100 mEq/L (ref 96–112)
Creatinine, Ser: 2.74 mg/dL — ABNORMAL HIGH (ref 0.50–1.35)
GFR calc Af Amer: 23 mL/min — ABNORMAL LOW (ref >90)
Glucose, Bld: 139 mg/dL — ABNORMAL HIGH (ref 70–99)
Potassium: 4.4 mEq/L (ref 3.5–5.1)

## 2012-07-30 LAB — SURGICAL PCR SCREEN: Staphylococcus aureus: NEGATIVE

## 2012-07-30 NOTE — Patient Instructions (Signed)
20 Jeffery Gibson  07/30/2012   Your procedure is scheduled on: 08/04/12  Report to [facility] at 1215 AM.  Call this number if you have problems the morning of surgery 336-: [phone]   Remember: half dose of lantus   Do not eat food After Midnight 08/04/12, clear liquids midnight until 0845 then nothing     Take these medicines the morning of surgery with A SIP OF WATER: amiodarone, amlodipine, metoprolol   Do not wear jewelry, make-up or nail polish.  Do not wear lotions, powders, or perfumes. You may wear deodorant.  Do not shave 48 hours prior to surgery. Men may shave face and neck.  Do not bring valuables to the hospital.  Contacts, dentures or bridgework may not be worn into surgery.  Leave suitcase in the car. After surgery it may be brought to your room.  For patients admitted to the hospital, checkout time is 11:00 AM the day of discharge.   Patients discharged the day of surgery will not be allowed to drive home.  Name and phone number of your driver: wife    Please read over the following fact sheets that you were given: MRSA Information.  Birdie Sons, RN  pre op nurse call if needed [phone]    FAILURE TO FOLLOW THESE INSTRUCTIONS MAY RESULT IN CANCELLATION OF YOUR SURGERY   Patient Signature: ___________________________________________

## 2012-07-30 NOTE — Progress Notes (Signed)
Chest x-ray 11/10/11 on EPIC, EKG 03/10/12 on EPIC, ECHO 11/12/11 on EPIC, perioperative prescription for implanted cardiac device programming form Dr. Johney Frame 07/28/12 on chart

## 2012-08-01 NOTE — H&P (Signed)
History of Present Illness  Jeffery Gibson is an 79 year old with the following urologic history:  1) Prostate cancer: He has a history of prostate cancer dating back to 2006 as previously been seen in this office by Dr. Wanda Plump and Dr. Aldean Ast although has not been seen in this office since 2010. He was initially diagnosed with Gleason 7 adenocarcinoma of the prostate in November 2006. His PSA at that time was 5.6. He underwent definitive therapy with a radiation seed implantation by Dr. Boston Service and Dr. Chipper Herb in January 2007. He initially had a favorable result with a PSA declined to under 1.0. He subsequently established care with Dr. Aldean Ast after Dr. Wanda Plump left [place] and was noted to have an increase of his PSA in the spring of 2009. He did begin androgen deprivation therapy in the late summer of 2009 and eventually transferred his care to [facility] under the care of Dr. Luane School. His PSA was noted to be increasing in 2012 although rather slowly. He did have a bilateral simple orchiectomy performed in August of 2013 but had not undergone any further therapy aside from androgen deprivation prior to his initial consultation with me in February 2014. His PSA continued to rise despite his orchiectomy.  2) BPH/LUTS: He also has a history of BPH and lower urinary tract symptoms. He was taking finasteride, tamsulosin, and oxybutynin ER 10 mg when I first saw him in February 2014. He apparently had gross hematuria and by history was noted to have some difficulty emptying his bladder. He underwent a "laser" procedure presumably due to bleeding related to his radiation therapy of the prostate while being followed by [facility]. Since this treatment, his hematuria has stopped. Ever since then, he has been catheterizing twice daily there is no known history of stricture disease. Currently, he continues to have some episodes of unconscious incontinence which typically occur when he is  sleeping or early morning. He typically does wear depends undergarments and this does cause him some distress. He does have significant dry mouth with oxybutynin.  3) CKD: He has a long-standing history of chronic kidney disease with a baseline creatinine of approximately 2.1. He has been followed by Dr. Marina Gravel since June 2013 when he developed acute renal failure.     4) Testosterone deficiency/bone health: He does take vitamin D supplementation although currently is not on a calcium supplement. He has not undergone a bone density evaluation since starting androgen deprivation therapy in 2009. He does have fatigue as expected on androgen deprivation therapy. His hot flashes have been mild and are not particularly bothersome.  Interval history:  Jeffery Gibson follows up today with his wife and his son to discuss his recent CT scan and bone scan for staging of his prostate cancer. He was noted to have a solitary lesion in the posterior left sixth rib consistent with metastasis on his bone scan and a CT scan. Fortunately, there were no other sites of metastases identified. He did not have any evidence of retroperitoneal lymphadenopathy. However, he does have evidence of bilateral hydronephrosis with fairly significant dilation of his ureters down to the level of the ureterovesical junction. He also some posterior thickening of the bladder which may be related to radiation changes or possibly secondary to locally advanced prostate cancer. We had previously reviewed his renal scan which did not look particularly concerning but he does have an elevated creatinine and the presence of baseline chronic kidney disease.  He remains completely asymptomatic from his  prostate cancer. He is scheduled to discuss further treatment options today.  In addition, he did stop oxybutynin and has had worsening nocturia but improvement of his dry mouth symptoms.     Past Medical History Problems  1. History of  Acute  Myocardial Infarction V12.59 2. History of  Arthritis V13.4 3. History of  Atrial Fibrillation 427.31 4. History of  Cardiac Failure 428.9 5. History of  Clostridium Difficile Colitis 008.45 6. History of  Colon Cancer V10.05 7. History of  Diabetes Mellitus 250.00 8. History of  Esophageal Reflux 530.81 9. History of  Heart Disease 429.9 10. History of  Hypertension 401.9  Surgical History Problems  1. History of  CABG (CABG) 2. History of  Colon Surgery 3. History of  Pacemaker Placement 4. History of  Surgery Prostate Transperineal Placement Of Needles  Current Meds 1. Adult Aspirin Low Strength 81 MG Oral Tablet Dispersible; Therapy: (Recorded:28Apr2008) to 2. [employer] Vitamin C TABS; Therapy: (Recorded:28Apr2008) to 3. Amiodarone HCl 200 MG Oral Tablet; Therapy: (Recorded:25Feb2014) to 4. AmLODIPine Besylate 5 MG Oral Tablet; Therapy: 17Dec2013 to 5. Crestor 10 MG Oral Tablet; Therapy: (Recorded:25Feb2014) to 6. Finasteride 5 MG Oral Tablet; Therapy: 27Jan2014 to 7. Furosemide 40 MG Oral Tablet; Therapy: 07Oct2013 to 8. Lantus SoloStar 100 UNIT/ML Subcutaneous Solution; Therapy: 04Oct2013 to 9. Metoprolol Tartrate 50 MG Oral Tablet; Therapy: (Recorded:28Apr2008) to 10. Multi-Day Vitamins TABS; Therapy: (Recorded:28Apr2008) to 11. Omeprazole 20 MG Oral Capsule Delayed Release; Therapy: (Recorded:28Apr2008) to 12. Oxybutynin Chloride ER 10 MG Oral Tablet Extended Release 24 Hour; Therapy: 18Oct2013 to 13. Tamsulosin HCl 0.4 MG Oral Capsule; Therapy: 27Jan2014 to 14. Ursodiol 300 MG Oral Capsule; Therapy: 23Oct2013 to  Allergies Medication  1. No Known Drug Allergies  Family History Problems  1. Paternal history of  Cardiac Failure  Social History Problems  1. Marital History - Currently Married 2. Occupation: retired 3. History of  Tobacco Use V15.82 1 ppd for 5 years, quti smoking 1964 Denied  4. Alcohol Use  Vitals Vital Signs [Data Includes: Last 1 Day]   05Mar2014 03:41PM  BMI Calculated: 36.68 BSA Calculated: 2.22 Height: 5 ft 8 in Weight: 242 lb  Blood Pressure: 146 / 73 Temperature: 96.9 F Heart Rate: 75  Physical Exam Constitutional: Well nourished and well developed . No acute distress.  Pulmonary: No respiratory distress and normal respiratory rhythm and effort.  Cardiovascular: Heart rate and rhythm are normal . No peripheral edema.    Results/Data Selected Results  CT-ABD/PELVIS W/O CONTRAST 27Feb2014 12:00AM Heloise Purpura   Test Name Result Flag Reference  ** RADIOLOGY REPORT BY [facility] **   *RADIOLOGY REPORT*  Clinical Data: Prostate cancer status post seed implants in 2007, hormone therapy 2009, and bilateral orchiectomy in 2013. Colon cancer status post surgery in 2005. Rising PSA. History of renal stones. Follow-up hydro.  CT ABDOMEN AND PELVIS WITHOUT CONTRAST  Technique: Multidetector CT imaging of the abdomen and pelvis was performed following the standard protocol without intravenous contrast.  Comparison: 11/07/2011  Findings: Two 2 mm nodules along the minor fissure (series 3/images 1-2), unchanged since 2008, benign.  Unenhanced liver, spleen, pancreas, and adrenal glands are within normal limits.  Cholelithiasis (series 2/image 29), without associated inflammatory changes. No intrahepatic or extrahepatic ductal dilatation.  Multiple probable bilateral renal cysts, including a 2.4 cm interpolar left renal cyst (series 2/image 45) and a 1.5 cm right upper pole cyst (series 2/image 41), grossly unchanged. No renal calculi. Mild to moderate hydroureteronephrosis.  No evidence of bowel  obstruction. Normal appendix. Prior distal colonic resection with suture line in the left lower abdomen (series 2/image 82). Colonic diverticulosis without associated inflammatory changes.  Atherosclerotic calcifications of the abdominal aorta and branch vessels.  No abdominopelvic  ascites.  Scattered small retroperitoneal lymph nodes measuring up to 5-6 mm which do not meet pathologic CT size criteria.  Brachytherapy seeds in the prostate.  No ureteral or bladder calculi.  Irregular posterior bladder wall thickening (series 2/image 87), possibly reflecting radiation changes.  Sclerosis involving the left lateral sixth rib (series 2/image 1), incompletely visualized, new.  IMPRESSION: Brachytherapy seeds in the prostate.  Sclerosis involving the left lateral sixth rib, new. While incompletely visualized, this appearance is worrisome for osseous metastasis.  Mild to moderate bilateral hydroureteronephrosis. No renal, ureteral, or bladder calculi.  Irregular posterior bladder wall thickening, possibly reflecting radiation changes. Cystoscopic correlation is suggested.   Original Report Authenticated By: Charline Bills, M.D.     I have independently reviewed the CT scan and bone scan. Findings are as dictated above.  Assessment Assessed  1. Prostate Cancer 185  Plan Prostate Cancer (185)  1. Provenge  Requested for: 05Mar2014 2. Follow-up Keep Future Appt Office  Follow-up  Requested for: 05Mar2014  Discussion/Summary  1. Castrate resistant metastatic prostate cancer: We reviewed options for ongoing therapy which would include observation, second line hormonal manipulation, abiraterone, or immunotherapy with Provenge. He was interested in Provenge considering his desire to avoid any significant toxicity with therapy at this point.  We discussed treatment with Provenge. I explained that this an immunotherapy for men with advance castrate resistant prostate cancer who are asymptomatic or minimally symptomatic. We discussed that this treatment will not lower PSA levels or significantly improve findings on imaging studies.  However, it has been consistently demonstrated to improve overall survival compared with men who did not receive this treatment.  It  was explained that this vaccine is a pheresis product and the side effects and procedures were discussed including cell collection and subsequent re-infusion. It was explained this therapy is generally very well-tolerated and low risk. However, it was explained that the majority of patients will develop flu-like symptoms such as fever, chills, nausea, back or joint ache, fatigue, or headache for 24-48 hrs after infusion.  It was explained that less common side effects including catheter complications, blood clots, infection, and bleeding can rarely occur.  It was explained that the cell collection process occurs followed by an intravenous infusion of the product three days later and that this process is repeated three times over 5 to 6 weeks. He is interested in proceeding with this therapy then we will begin the approval process at this time.  2. BPH/LUTS: He will continue tamsulosin finasteride. He will begin samples of Myrbetriq 25 mg and increase that to 50 mg if not fully effective. Hopefully this will provide adequate treatment with a decrease in his side effects particularly the dry mouth. He'll keep his appointment in May with a PVR at that time.  3. Testosterone deficiency/bone health: He'll continue vitamin D and calcium supplementation. He is scheduled for a DEXA scan at his next appointment in May.  4. Bilateral hydronephrosis: I reviewed his CT scan findings with him today. I've also contacted Dr. Marina Gravel, his nephrologist. Although his Lasix renal scan does not indicate that he has complete obstruction, it certainly could be that he has a component of obstruction contributing to his renal dysfunction. Dr. Caryn Section did think that ureteral stent placement would be helpful in  order to optimize his renal function. He may have worsening lower urinary tract symptoms related to his stents and if this ultimately did not improve his renal function, we may discuss removal of the stents in the future. He  therefore will be scheduled for cystoscopy and ureteral stent placement. He also has some thickening of his posterior bladder and will be prepared to perform bladder biopsies if necessary at that time.  Cc: Dr. Alysia Penna Dr. Marina Gravel Dr. Drue Second   a total of 35 minutes were spent in the overall care of the patient today Amended By: Heloise Purpura; 07/23/2012 5:31 PMEST with 35 minutes in direct face to face consultation. Amended By: Heloise Purpura; 07/23/2012 5:31 PMEST    Signatures Electronically signed by : Heloise Purpura, M.D.; Jul 23 2012  5:31PM

## 2012-08-04 ENCOUNTER — Encounter (HOSPITAL_COMMUNITY): Payer: Self-pay | Admitting: *Deleted

## 2012-08-04 ENCOUNTER — Ambulatory Visit (HOSPITAL_COMMUNITY)
Admission: RE | Admit: 2012-08-04 | Discharge: 2012-08-04 | Disposition: A | Discharge status: Home / Self Care | Payer: Medicare Other | Source: Ambulatory Visit | Attending: Urology | Admitting: Urology

## 2012-08-04 ENCOUNTER — Ambulatory Visit (HOSPITAL_COMMUNITY): Payer: Medicare Other | Admitting: Anesthesiology

## 2012-08-04 ENCOUNTER — Encounter (HOSPITAL_COMMUNITY)
Admission: RE | Disposition: A | Discharge status: Home / Self Care | Payer: Self-pay | Source: Ambulatory Visit | Attending: Urology

## 2012-08-04 ENCOUNTER — Encounter (HOSPITAL_COMMUNITY): Payer: Self-pay | Admitting: Anesthesiology

## 2012-08-04 DIAGNOSIS — I4891 Unspecified atrial fibrillation: Secondary | ICD-10-CM | POA: Insufficient documentation

## 2012-08-04 DIAGNOSIS — N39498 Other specified urinary incontinence: Secondary | ICD-10-CM | POA: Insufficient documentation

## 2012-08-04 DIAGNOSIS — N138 Other obstructive and reflux uropathy: Secondary | ICD-10-CM | POA: Insufficient documentation

## 2012-08-04 DIAGNOSIS — N1330 Unspecified hydronephrosis: Secondary | ICD-10-CM | POA: Insufficient documentation

## 2012-08-04 DIAGNOSIS — Z79899 Other long term (current) drug therapy: Secondary | ICD-10-CM | POA: Insufficient documentation

## 2012-08-04 DIAGNOSIS — C61 Malignant neoplasm of prostate: Secondary | ICD-10-CM | POA: Insufficient documentation

## 2012-08-04 DIAGNOSIS — K219 Gastro-esophageal reflux disease without esophagitis: Secondary | ICD-10-CM | POA: Insufficient documentation

## 2012-08-04 DIAGNOSIS — N135 Crossing vessel and stricture of ureter without hydronephrosis: Secondary | ICD-10-CM | POA: Insufficient documentation

## 2012-08-04 DIAGNOSIS — Z951 Presence of aortocoronary bypass graft: Secondary | ICD-10-CM | POA: Insufficient documentation

## 2012-08-04 DIAGNOSIS — Z85038 Personal history of other malignant neoplasm of large intestine: Secondary | ICD-10-CM | POA: Insufficient documentation

## 2012-08-04 DIAGNOSIS — Z7982 Long term (current) use of aspirin: Secondary | ICD-10-CM | POA: Insufficient documentation

## 2012-08-04 DIAGNOSIS — I129 Hypertensive chronic kidney disease with stage 1 through stage 4 chronic kidney disease, or unspecified chronic kidney disease: Secondary | ICD-10-CM | POA: Insufficient documentation

## 2012-08-04 DIAGNOSIS — R5381 Other malaise: Secondary | ICD-10-CM | POA: Insufficient documentation

## 2012-08-04 DIAGNOSIS — N401 Enlarged prostate with lower urinary tract symptoms: Secondary | ICD-10-CM | POA: Insufficient documentation

## 2012-08-04 DIAGNOSIS — E291 Testicular hypofunction: Secondary | ICD-10-CM | POA: Insufficient documentation

## 2012-08-04 DIAGNOSIS — Z794 Long term (current) use of insulin: Secondary | ICD-10-CM | POA: Insufficient documentation

## 2012-08-04 DIAGNOSIS — Z950 Presence of cardiac pacemaker: Secondary | ICD-10-CM | POA: Insufficient documentation

## 2012-08-04 DIAGNOSIS — N189 Chronic kidney disease, unspecified: Secondary | ICD-10-CM | POA: Insufficient documentation

## 2012-08-04 DIAGNOSIS — I252 Old myocardial infarction: Secondary | ICD-10-CM | POA: Insufficient documentation

## 2012-08-04 HISTORY — PX: CYSTOSCOPY W/ URETERAL STENT PLACEMENT: SHX1429

## 2012-08-04 LAB — GLUCOSE, CAPILLARY
Glucose-Capillary: 117 mg/dL — ABNORMAL HIGH (ref 70–99)
Glucose-Capillary: 136 mg/dL — ABNORMAL HIGH (ref 70–99)

## 2012-08-04 SURGERY — CYSTOSCOPY, WITH RETROGRADE PYELOGRAM AND URETERAL STENT INSERTION
Anesthesia: General | Site: Ureter | Wound class: Clean Contaminated

## 2012-08-04 MED ORDER — PROMETHAZINE HCL 25 MG/ML IJ SOLN: 6.2500 mg | INTRAMUSCULAR | Status: DC | PRN

## 2012-08-04 MED ORDER — ACETAMINOPHEN 10 MG/ML IV SOLN: 1000.0000 mg | Freq: Once | INTRAVENOUS | Status: DC | PRN

## 2012-08-04 MED ORDER — LIDOCAINE HCL (CARDIAC) 10 MG/ML IV SOLN
INTRAVENOUS | Status: DC | PRN
  Administered 2012-08-04: 60 mg via INTRAVENOUS

## 2012-08-04 MED ORDER — SODIUM CHLORIDE 0.9 % IV SOLN
INTRAVENOUS | Status: DC
  Administered 2012-08-04: 1000 mL via INTRAVENOUS

## 2012-08-04 MED ORDER — CIPROFLOXACIN IN D5W 200 MG/100ML IV SOLN
200.0000 mg | INTRAVENOUS | Status: AC
  Administered 2012-08-04: 200 mg via INTRAVENOUS
  Filled 2012-08-04: qty 100

## 2012-08-04 MED ORDER — HYDROCODONE-ACETAMINOPHEN 5-325 MG PO TABS
1.0000 | ORAL_TABLET | ORAL | Status: DC | PRN
  Administered 2012-08-04: 1 via ORAL
  Filled 2012-08-04: qty 1

## 2012-08-04 MED ORDER — INDIGOTINDISULFONATE SODIUM 8 MG/ML IJ SOLN
INTRAMUSCULAR | Status: DC | PRN
  Administered 2012-08-04: 5 mL via INTRAVENOUS

## 2012-08-04 MED ORDER — MIDAZOLAM HCL 5 MG/5ML IJ SOLN
INTRAMUSCULAR | Status: DC | PRN
  Administered 2012-08-04: 1 mg via INTRAVENOUS

## 2012-08-04 MED ORDER — PHENAZOPYRIDINE HCL 100 MG PO TABS: 100.0000 mg | ORAL_TABLET | Freq: Three times a day (TID) | ORAL | Status: DC | PRN

## 2012-08-04 MED ORDER — IOHEXOL 300 MG/ML  SOLN
INTRAMUSCULAR | Status: AC
  Filled 2012-08-04: qty 1

## 2012-08-04 MED ORDER — OXYCODONE HCL 5 MG PO TABS: 5.0000 mg | ORAL_TABLET | Freq: Once | ORAL | Status: DC | PRN

## 2012-08-04 MED ORDER — FENTANYL CITRATE 0.05 MG/ML IJ SOLN
INTRAMUSCULAR | Status: DC | PRN
  Administered 2012-08-04: 50 ug via INTRAVENOUS
  Administered 2012-08-04 (×2): 25 ug via INTRAVENOUS
  Administered 2012-08-04 (×2): 50 ug via INTRAVENOUS

## 2012-08-04 MED ORDER — PROPOFOL 10 MG/ML IV BOLUS
INTRAVENOUS | Status: DC | PRN
  Administered 2012-08-04: 100 mg via INTRAVENOUS
  Administered 2012-08-04: 150 mg via INTRAVENOUS

## 2012-08-04 MED ORDER — ONDANSETRON HCL 4 MG/2ML IJ SOLN
INTRAMUSCULAR | Status: DC | PRN
  Administered 2012-08-04: 4 mg via INTRAVENOUS

## 2012-08-04 MED ORDER — HYDROMORPHONE HCL PF 1 MG/ML IJ SOLN: 0.2500 mg | INTRAMUSCULAR | Status: DC | PRN

## 2012-08-04 MED ORDER — CIPROFLOXACIN HCL 250 MG PO TABS: 250.0000 mg | ORAL_TABLET | Freq: Two times a day (BID) | ORAL | Status: DC

## 2012-08-04 MED ORDER — MEPERIDINE HCL 50 MG/ML IJ SOLN: 6.2500 mg | INTRAMUSCULAR | Status: DC | PRN

## 2012-08-04 MED ORDER — OXYCODONE HCL 5 MG/5ML PO SOLN
5.0000 mg | Freq: Once | ORAL | Status: DC | PRN
  Filled 2012-08-04: qty 5

## 2012-08-04 SURGICAL SUPPLY — 22 items
ADAPTER CATH URET PLST 4-6FR (CATHETERS) ×3 IMPLANT
ADPR CATH URET STRL DISP 4-6FR (CATHETERS) ×1
BAG URO CATCHER STRL LF (DRAPE) ×3 IMPLANT
CATH INTERMIT  6FR 70CM (CATHETERS) ×6 IMPLANT
CATH ROBINSON RED A/P 16FR (CATHETERS) IMPLANT
CLOTH BEACON ORANGE TIMEOUT ST (SAFETY) ×3 IMPLANT
DRAPE CAMERA CLOSED 9X96 (DRAPES) ×3 IMPLANT
ELECT REM PT RETURN 9FT ADLT (ELECTROSURGICAL) ×3
ELECTRODE REM PT RTRN 9FT ADLT (ELECTROSURGICAL) ×2 IMPLANT
GLOVE BIOGEL M STRL SZ7.5 (GLOVE) ×3 IMPLANT
GOWN STRL NON-REIN LRG LVL3 (GOWN DISPOSABLE) ×6 IMPLANT
GOWN STRL REIN XL XLG (GOWN DISPOSABLE) ×3 IMPLANT
GUIDEWIRE STR DUAL SENSOR (WIRE) ×3 IMPLANT
MANIFOLD NEPTUNE II (INSTRUMENTS) ×3 IMPLANT
NDL SAFETY ECLIPSE 18X1.5 (NEEDLE) IMPLANT
NEEDLE HYPO 18GX1.5 SHARP (NEEDLE)
NEEDLE HYPO 22GX1.5 SAFETY (NEEDLE) IMPLANT
PACK CYSTO (CUSTOM PROCEDURE TRAY) ×3 IMPLANT
STENT CONTOUR 6FRX26X.038 (STENTS) ×3 IMPLANT
STENT CONTOUR NO GW 8FR 26CM (STENTS) ×3 IMPLANT
TUBING CONNECTING 10 (TUBING) IMPLANT
WATER STERILE IRR 3000ML UROMA (IV SOLUTION) ×3 IMPLANT

## 2012-08-04 NOTE — Interval H&P Note (Signed)
History and Physical Interval Note:  08/04/2012 2:57 PM  Jeffery Gibson  has presented today for surgery, with the diagnosis of BILATERAL HYDRONEPHOSIS  The various methods of treatment have been discussed with the patient and family. After consideration of risks, benefits and other options for treatment, the patient has consented to  Procedure(s) with comments: CYSTOSCOPY WITH RETROGRADE PYELOGRAM/URETERAL STENT PLACEMENT (Bilateral) - ALSO POSSIBLE BLADDER BIOPSY  POSSIBLE BLADDER BIOPSY (N/A) as a surgical intervention .  The patient's history has been reviewed, patient examined, no change in status, stable for surgery.  I have reviewed the patient's chart and labs.  Questions were answered to the patient's satisfaction.     Farron Watrous,LES

## 2012-08-04 NOTE — Op Note (Signed)
Preoperative diagnosis:  1. Metastatic prostate cancer 2. Bilateral hydronephrosis 3. Renal dysfunction   Postoperative diagnosis:  1. Metastatic prostate cancer 2. Bilateral hydronephrosis secondary to ureteral obstruction 3. Renal dysfunction   Procedure:  1. Cystoscopy 2. Bilateral retrograde pyelography with interpretation  3. Attempted placement of bilateral ureteral stents  Surgeon: [name]. M.D.  Anesthesia: General  Complications: None  Intraoperative findings: Cystoscopy revealed evidence of locally advanced prostate cancer with intravesical extension and significant distortion of the bladder trigone. The right ureteral orifice was able to be identified it was markedly distorted. The left ureteral orifice was also able to be identified and was also markedly distorted. The right retrograde pyelogram demonstrated a very narrowed intravesical portion of the ureter with severe dilation of the ureter proximal to this area including tortuosity extending all the way up to the renal pelvis. No obvious filling defects were identified. The left retrograde pyelogram also demonstrated similar findings.  EBL: Minimal  Specimens: None  Indication: Jeffery Gibson is a 79 y.o. patient with metastatic prostate cancer, and elevated creatinine, and bilateral hydronephrosis. After reviewing the management options for treatment, he elected to proceed with the above surgical procedure(s). We have discussed the potential benefits and risks of the procedure, side effects of the proposed treatment, the likelihood of the patient achieving the goals of the procedure, and any potential problems that might occur during the procedure or recuperation. Informed consent has been obtained.  Description of procedure:  The patient was taken to the operating room and general anesthesia was induced.  The patient was placed in the dorsal lithotomy position, prepped and draped in the usual sterile  fashion, and preoperative antibiotics were administered. A preoperative time-out was performed.   Cystourethroscopy was performed.  The patient's urethra was examined and was normal up until the prostatic urethra which was abnormal with a very high bladder neck there was noted to be rigid.. The bladder was then systematically examined in its entirety. There was no evidence for any bladder tumors, stones, or other mucosal pathology. However, he did have evidence of local progression of his prostate cancer with intravesical extension and marked it distortion of the trigone.  Attention then turned to the right ureteral orifice. This was not easily identified but injection of Omnipaque contrast through a urethral catheter did confirm location of the right ureteral orifice.  Omnipaque contrast was injected through the ureteral catheter and a retrograde pyelogram was performed with findings as dictated above. In summary, there was significant dilation of the ureter proximal to the intravesical ureter.  A 0.38 sensor guidewire was then advanced up the right ureter into the renal pelvis under fluoroscopic guidance.  The wire was then backloaded through the cystoscope and a ureteral stent was advance over the wire using Seldinger technique.  At first, an attempt was made to place an 8 x 26 double-J ureteral stent. However, this could not be advanced over the wire without the ureter buckling proximal he indicating the possibility of further obstruction or significant tortuosity in the proximal ureter. The stent was therefore removed and an attempt was made to place a 6 x 26 double-J ureteral stent. Again, this stent could not be placed appropriately and could not be passed all the way into the renal collecting system. Therefore, the stent was removed.  Attention then turned to the left ureteral orifice. This also was difficult to identify but was confirmed after Omnipaque contrast was injected through a ureteral  catheter and confirmed contrast extension  into the ureter. The ureter was also noted to be severely dilated on this side proximal to the intravesical portion of the ureter. Attempts to cannulate the ureter on this side were unsuccessful due to the extent of his locally advanced prostate cancer.  At this point, it was felt that the patient would require antegrade stent placement in interventional radiology.  The bladder was then emptied and the procedure ended.  The patient appeared to tolerate the procedure well and without complications.  The patient was able to be awakened and transferred to the recovery unit in satisfactory condition.    [name] MD

## 2012-08-04 NOTE — Progress Notes (Signed)
Pt informs RN that he catheterizes himself twice a day at home. Has difficulty voiding without it. Pt cathed post op for 10 cc. Abdominal pain is not resolved. Given hydrocodone for lower abdominal pain.

## 2012-08-04 NOTE — Transfer of Care (Signed)
Immediate Anesthesia Transfer of Care Note  Patient: Jeffery Gibson  Procedure(s) Performed: Procedure(s) with comments: CYSTOSCOPY WITH bilateral retrograde (Bilateral) - attempted stent placement  Patient Location: PACU  Anesthesia Type:General  Level of Consciousness: awake, alert , oriented and patient cooperative  Airway & Oxygen Therapy: Patient Spontanous Breathing and Patient connected to face mask oxygen  Post-op Assessment: Report given to PACU RN, Post -op Vital signs reviewed and stable and Patient moving all extremities X 4  Post vital signs: stable  Complications: No apparent anesthesia complications

## 2012-08-04 NOTE — Anesthesia Preprocedure Evaluation (Addendum)
Anesthesia Evaluation  Patient identified by MRN, date of birth, ID band Patient awake    Reviewed: Allergy & Precautions, H&P , NPO status , Patient's Chart, lab work & pertinent test results, reviewed documented beta blocker date and time   Airway Mallampati: III TM Distance: >3 FB Neck ROM: Full    Dental  (+) Dental Advisory Given, Teeth Intact and Caps   Pulmonary shortness of breath and with exertion, sleep apnea , former smoker,  breath sounds clear to auscultation        Cardiovascular hypertension, Pt. on medications and Pt. on home beta blockers + angina + CAD, + Past MI, + CABG and +CHF + dysrhythmias Atrial Fibrillation + pacemaker Rhythm:Regular Rate:Normal   Left ventricle: The cavity size was normal. Wall thickness   was increased in a pattern of mild LVH. Systolic function   was mildly reduced. The estimated ejection fraction was in  the range of 45% to 50%. Septal bounce. Diffuse   hypokinesis. - Aortic valve: There was no stenosis. - Mitral valve: Mild regurgitation. - Left atrium: The atrium was mildly to moderately dilated. - Right ventricle: The cavity size was mildly dilated.   Systolic function was mildly reduced. - Right atrium: The atrium was mildly to moderately dilated. - Tricuspid valve: Peak RV-RA gradient: 34mm Hg (S). - Pulmonary arteries: PA peak pressure: 44mm Hg (S). - Systemic veins: IVC not visualized.  Impressions: - Technically difficult study with poor acoustic windows.   The patient was in atrial fibrillation. Normal LV size   with mild LV hypertrophy. EF 45-50%, mild global   hypokinesis. Mildly dilated RV with mildly decreased   systolic function. MIld pulmonary hypertension. Biatrial   enlargement.  Pt 98% AV paced   Neuro/Psych Seizures -, Well Controlled,  negative psych ROS   GI/Hepatic Neg liver ROS, GERD-  ,  Endo/Other  diabetes, Type 2  Renal/GU ARF and Renal  InsufficiencyRenal diseasenegative Renal ROS     Musculoskeletal negative musculoskeletal ROS (+)   Abdominal (+) + obese,   Peds  Hematology negative hematology ROS (+)   Anesthesia Other Findings   Reproductive/Obstetrics                        Anesthesia Physical Anesthesia Plan  ASA: IV  Anesthesia Plan: General   Post-op Pain Management:    Induction: Intravenous  Airway Management Planned: LMA  Additional Equipment:   Intra-op Plan:   Post-operative Plan: Extubation in OR  Informed Consent: I have reviewed the patients History and Physical, chart, labs and discussed the procedure including the risks, benefits and alternatives for the proposed anesthesia with the patient or authorized representative who has indicated his/her understanding and acceptance.   Dental advisory given  Plan Discussed with: CRNA  Anesthesia Plan Comments: (Magnet in room.)       Anesthesia Quick Evaluation

## 2012-08-04 NOTE — Anesthesia Postprocedure Evaluation (Signed)
Anesthesia Post Note  Patient: Jeffery Gibson  Procedure(s) Performed: Procedure(s) (LRB): CYSTOSCOPY WITH bilateral retrograde (Bilateral)  Anesthesia type: General  Patient location: PACU  Post pain: Pain level controlled  Post assessment: Post-op Vital signs reviewed  Last Vitals: BP 146/78  Pulse 66  Temp(Src) 36.2 C (Oral)  Resp 16  SpO2 93%  Post vital signs: Reviewed  Level of consciousness: sedated  Complications: No apparent anesthesia complications

## 2012-08-06 ENCOUNTER — Other Ambulatory Visit: Payer: Self-pay | Admitting: Radiology

## 2012-08-06 ENCOUNTER — Encounter (HOSPITAL_COMMUNITY): Payer: Self-pay | Admitting: Urology

## 2012-08-06 ENCOUNTER — Other Ambulatory Visit: Payer: Self-pay | Admitting: Urology

## 2012-08-07 ENCOUNTER — Ambulatory Visit (HOSPITAL_COMMUNITY)
Admission: RE | Admit: 2012-08-07 | Discharge: 2012-08-07 | Disposition: A | Discharge status: Home / Self Care | Payer: Medicare Other | Source: Ambulatory Visit | Attending: Urology | Admitting: Urology

## 2012-08-07 ENCOUNTER — Inpatient Hospital Stay (HOSPITAL_COMMUNITY)
Admission: RE | Admit: 2012-08-07 | Discharge: 2012-08-07 | Disposition: A | Discharge status: Home / Self Care | Payer: Medicare Other | Source: Ambulatory Visit

## 2012-08-07 ENCOUNTER — Encounter (HOSPITAL_COMMUNITY): Payer: Self-pay

## 2012-08-07 ENCOUNTER — Other Ambulatory Visit: Payer: Self-pay | Admitting: Urology

## 2012-08-07 DIAGNOSIS — N189 Chronic kidney disease, unspecified: Secondary | ICD-10-CM | POA: Insufficient documentation

## 2012-08-07 DIAGNOSIS — Z951 Presence of aortocoronary bypass graft: Secondary | ICD-10-CM | POA: Insufficient documentation

## 2012-08-07 DIAGNOSIS — I252 Old myocardial infarction: Secondary | ICD-10-CM | POA: Insufficient documentation

## 2012-08-07 DIAGNOSIS — N1330 Unspecified hydronephrosis: Secondary | ICD-10-CM | POA: Insufficient documentation

## 2012-08-07 DIAGNOSIS — I4892 Unspecified atrial flutter: Secondary | ICD-10-CM | POA: Insufficient documentation

## 2012-08-07 DIAGNOSIS — I129 Hypertensive chronic kidney disease with stage 1 through stage 4 chronic kidney disease, or unspecified chronic kidney disease: Secondary | ICD-10-CM | POA: Insufficient documentation

## 2012-08-07 DIAGNOSIS — E785 Hyperlipidemia, unspecified: Secondary | ICD-10-CM | POA: Insufficient documentation

## 2012-08-07 DIAGNOSIS — Z79899 Other long term (current) drug therapy: Secondary | ICD-10-CM | POA: Insufficient documentation

## 2012-08-07 DIAGNOSIS — K219 Gastro-esophageal reflux disease without esophagitis: Secondary | ICD-10-CM | POA: Insufficient documentation

## 2012-08-07 DIAGNOSIS — I4891 Unspecified atrial fibrillation: Secondary | ICD-10-CM | POA: Insufficient documentation

## 2012-08-07 DIAGNOSIS — Z85038 Personal history of other malignant neoplasm of large intestine: Secondary | ICD-10-CM | POA: Insufficient documentation

## 2012-08-07 DIAGNOSIS — I2510 Atherosclerotic heart disease of native coronary artery without angina pectoris: Secondary | ICD-10-CM | POA: Insufficient documentation

## 2012-08-07 DIAGNOSIS — C61 Malignant neoplasm of prostate: Secondary | ICD-10-CM | POA: Insufficient documentation

## 2012-08-07 DIAGNOSIS — E119 Type 2 diabetes mellitus without complications: Secondary | ICD-10-CM | POA: Insufficient documentation

## 2012-08-07 DIAGNOSIS — Z950 Presence of cardiac pacemaker: Secondary | ICD-10-CM | POA: Insufficient documentation

## 2012-08-07 DIAGNOSIS — G4730 Sleep apnea, unspecified: Secondary | ICD-10-CM | POA: Insufficient documentation

## 2012-08-07 LAB — BASIC METABOLIC PANEL
BUN: 50 mg/dL — ABNORMAL HIGH (ref 6–23)
CO2: 22 mEq/L (ref 19–32)
Calcium: 8.7 mg/dL (ref 8.4–10.5)
Chloride: 93 mEq/L — ABNORMAL LOW (ref 96–112)
Creatinine, Ser: 3.88 mg/dL — ABNORMAL HIGH (ref 0.50–1.35)
GFR calc Af Amer: 15 mL/min — ABNORMAL LOW (ref >90)

## 2012-08-07 LAB — CBC
HCT: 29.1 % — ABNORMAL LOW (ref 39.0–52.0)
MCHC: 34.7 g/dL (ref 30.0–36.0)
MCV: 93.3 fL (ref 78.0–100.0)
Platelets: 152 10*3/uL (ref 150–400)
RDW: 13.7 % (ref 11.5–15.5)

## 2012-08-07 MED ORDER — HYDROCODONE-ACETAMINOPHEN 5-325 MG PO TABS: 1.0000 | ORAL_TABLET | ORAL | Status: DC | PRN

## 2012-08-07 MED ORDER — IOHEXOL 300 MG/ML  SOLN
15.0000 mL | Freq: Once | INTRAMUSCULAR | Status: AC | PRN
  Administered 2012-08-07: 15 mL

## 2012-08-07 MED ORDER — FENTANYL CITRATE 0.05 MG/ML IJ SOLN
INTRAMUSCULAR | Status: AC | PRN
  Administered 2012-08-07: 50 ug via INTRAVENOUS
  Administered 2012-08-07 (×2): 25 ug via INTRAVENOUS

## 2012-08-07 MED ORDER — MIDAZOLAM HCL 2 MG/2ML IJ SOLN
INTRAMUSCULAR | Status: AC | PRN
  Administered 2012-08-07 (×2): 0.5 mg via INTRAVENOUS
  Administered 2012-08-07: 1 mg via INTRAVENOUS

## 2012-08-07 MED ORDER — SODIUM CHLORIDE 0.9 % IV SOLN
INTRAVENOUS | Status: DC
  Administered 2012-08-07: 08:00:00 via INTRAVENOUS

## 2012-08-07 MED ORDER — CIPROFLOXACIN IN D5W 400 MG/200ML IV SOLN
400.0000 mg | INTRAVENOUS | Status: AC
  Administered 2012-08-07: 400 mg via INTRAVENOUS
  Filled 2012-08-07: qty 200

## 2012-08-07 MED ORDER — FENTANYL CITRATE 0.05 MG/ML IJ SOLN
INTRAMUSCULAR | Status: AC
  Filled 2012-08-07: qty 6

## 2012-08-07 MED ORDER — LIDOCAINE HCL 1 % IJ SOLN
INTRAMUSCULAR | Status: AC
  Filled 2012-08-07: qty 20

## 2012-08-07 MED ORDER — MIDAZOLAM HCL 2 MG/2ML IJ SOLN
INTRAMUSCULAR | Status: AC
  Filled 2012-08-07: qty 6

## 2012-08-07 NOTE — H&P (Signed)
Agree with PA note.    Signed,  Heath K. McCullough, MD Vascular & Interventional Radiologist [facility]  

## 2012-08-07 NOTE — H&P (Signed)
Chief Complaint: "I'm here for kidney drains/stents" Referring Physician:Borden HPI: Jeffery Gibson is an 79 y.o. male with hx of prostate cancer which is now causing bilateral hydroneophrosis. Dr. Laverle Patter attempt cystoscopy and retrograde stent placement but was not able to pass the ureters. He is therefore referred to IR for bilateral PCN and subsequent stenting. PMHx and meds reviewed. He did well after his surgery earlier this week. Still c/o soreness and passing occasional clots with self catheterization.  Past Medical History:  Past Medical History  Diagnosis Date  . Ischemic heart disease 05/06/06    post CARG 05/06/06  . Obese     exogenous  . Renal insufficiency   . Anemia of chronic disease     aranesp injections  . Dyslipidemia   . Coronary artery disease   . Anemia associated with chronic renal failure 04/05/2011  . Cancer     prostate/on Lupron  . Adenocarcinoma of colon 11/2003    stage 2(T3,N0,M0)  . Diabetes mellitus   . Hypertension   . Anginal pain   . Myocardial infarction 1986  . Pacemaker   . Cataracts, bilateral   . Seizures   . Arthritis   . Hemorrhoids   . Sleep apnea   . GERD (gastroesophageal reflux disease)     hx of  . Hematoma     hx of  . Atrial flutter     afib and atrial flutter (permanent)  . Tachycardia-bradycardia 1997    dual-chamber/for tachybradycardia syndrome    Past Surgical History:  Past Surgical History  Procedure Laterality Date  . Laparotomy  12/04/2003    resection of rectosigmoid carcinoma/  . Radioactive seed implant  05/2005    transperianeal placement I-125 for prostate cancer/# of  seeds 55  . Cardiac catheterization  05/01/06    EF 40%/diffuse 3 vessel CAD/tight L antereior descending artery stenosis/diffuse disease proximal L anterior descending  . Pacemaker removal  1997  . Fracture surgery      left ankle  . Insert / replace / remove pacemaker  2006  . Colon surgery  2005  . Coronary artery bypass graft   05/06/06    x4 with L internal mammary artery to the L anterior descending coronary artery  . Laser surgery  2012    prostate  . Orchiectomy  august 2013  . Cystoscopy w/ ureteral stent placement Bilateral 08/04/2012    Procedure: CYSTOSCOPY WITH bilateral retrograde;  Surgeon: Crecencio Mc, MD;  Location: [facility];  Service: Urology;  Laterality: Bilateral;  attempted stent placement     Family History:  Family History  Problem Relation Age of Onset  . Heart failure Father 71  . Gallbladder disease Father   . COPD    . Coronary artery disease    . Emphysema      Social History:  reports that he quit smoking about 49 years ago. His smoking use included Cigarettes. He smoked 0.00 packs per day for 10 years. He has never used smokeless tobacco. He reports that he does not drink alcohol or use illicit drugs.  Allergies:  Allergies  Allergen Reactions  . Latex Hives  . Lovenox (Enoxaparin Sodium)     Gi bleed  . Lupron (Leuprolide Acetate)     Kidney failure  . Warfarin And Related     Gi bleed    Medications: amLODipine (NORVASC) 10 MG tablet (Taking) Sig - Route: Take 10 mg by mouth every morning. - Oral Class: Historical Med Number of times this  order has been changed since signing: 2 Order [address] Cholecalciferol (VITAMIN D) 2000 UNITS tablet (Taking) Sig - Route: Take 2,000 Units by mouth daily. - Oral Class: Historical Med Number of times this order has been changed since signing: 1 Order [address] Darbepoetin Alfa-Albumin (ARANESP IJ) (Taking) Sig - Route: Inject 1 Syringe as directed See admin instructions. Every 4 weeks as needed for anemia - Injection Class: Historical Med Number of times this order has been changed since signing: 5 Order [address] furosemide (LASIX) 80 MG tablet (Taking) 11/21/2011 11/20/2012 Sig - Route: Take 40 mg by mouth daily. - Oral Class: Historical Med Number of times this order has been changed since signing: 4 Order [address] metoprolol  (LOPRESSOR) 50 MG tablet (Taking) Sig - Route: Take 50 mg by mouth 2 (two) times daily. - Oral Class: Historical Med Number of times this order has been changed since signing: 1 Order [address] rosuvastatin (CRESTOR) 10 MG tablet (Taking) 02/20/2012 Sig - Route: Take 5 mg by mouth daily. Take 1/2 tab daily. - Oral Class: Historical Med Number of times this order has been changed since signing: 3 Order [address] Tamsulosin HCl (FLOMAX) 0.4 MG CAPS (Taking) 05/28/2011 Sig - Route: Take 0.4 mg by mouth daily after supper. - Oral Class: Historical Med Number of times this order has been changed since signing: 3 Order [address] ursodiol (ACTIGALL) 300 MG capsule (Taking) Sig - Route: Take 300 mg by mouth 2 (two) times daily.    Please HPI for pertinent positives, otherwise complete 10 system ROS negative.  Physical Exam: Blood pressure 143/64, pulse 73, temperature 98.1 F (36.7 C), temperature source Oral, resp. rate 20, SpO2 95.00%. There is no weight on file to calculate BMI.   General Appearance:  Alert, cooperative, no distress, appears stated age  Head:  Normocephalic, without obvious abnormality, atraumatic  ENT: Unremarkable  Neck: Supple, symmetrical, trachea midline, no adenopathy, thyroid: not enlarged, symmetric, no tenderness/mass/nodules  Lungs:   Clear to auscultation bilaterally, few exp wheezes.  Heart:  Regular rate and rhythm, S1, S2 normal, no murmur, rub or gallop. Carotids 2+ without bruit.  Abdomen:   Soft, non-tender, non distended. Bowel sounds active all four quadrants,  no masses, no organomegaly.  Neurologic: Normal affect, no gross deficits.   Results for orders placed during the hospital encounter of 08/07/12 (from the past 48 hour(s))  APTT     Status: None   Collection Time    08/07/12  8:20 AM      Result Value Range   aPTT 37  24 - 37 seconds   Comment:            IF BASELINE aPTT IS ELEVATED,     SUGGEST PATIENT RISK ASSESSMENT     BE USED TO DETERMINE  APPROPRIATE     ANTICOAGULANT THERAPY.  CBC     Status: Abnormal   Collection Time    08/07/12  8:20 AM      Result Value Range   WBC 12.3 (*) 4.0 - 10.5 K/uL   RBC 3.12 (*) 4.22 - 5.81 MIL/uL   Hemoglobin 10.1 (*) 13.0 - 17.0 g/dL   HCT 40.9 (*) 81.1 - 91.4 %   MCV 93.3  78.0 - 100.0 fL   MCH 32.4  26.0 - 34.0 pg   MCHC 34.7  30.0 - 36.0 g/dL   RDW 78.2  95.6 - 21.3 %   Platelets 152  150 - 400 K/uL  PROTIME-INR  Status: None   Collection Time    08/07/12  8:20 AM      Result Value Range   Prothrombin Time 14.3  11.6 - 15.2 seconds   INR 1.13  0.00 - 1.49   No results found.  Assessment/Plan Bilateral hydronephrosis secondary to prostate cancer For (B)PCN Discussed procedure with pt and family, including risks, complications and subsequent planning for (B)stents. Labs reviewed, ok. Consent signed in chart  Brayton El PA-C 08/07/2012, 8:56 AM

## 2012-08-07 NOTE — Procedures (Signed)
Interventional Radiology Procedure Note  Procedure:  Placement of bilateral 29F percutaneous nephrostomy tubes. Complications: None Recommendations: - Tubes to bag drainage - Return to IR in 1 week for conversion to ureteral stents  Signed,  Sterling Big, MD Vascular & Interventional Radiologist [facility]

## 2012-08-08 ENCOUNTER — Encounter (HOSPITAL_COMMUNITY): Payer: Self-pay | Admitting: Pharmacy Technician

## 2012-08-11 ENCOUNTER — Encounter: Payer: Medicare Other | Admitting: *Deleted

## 2012-08-11 ENCOUNTER — Ambulatory Visit: Payer: Medicare Other

## 2012-08-11 ENCOUNTER — Other Ambulatory Visit: Payer: Self-pay | Admitting: Radiology

## 2012-08-13 ENCOUNTER — Ambulatory Visit (HOSPITAL_COMMUNITY)
Admission: RE | Admit: 2012-08-13 | Discharge: 2012-08-13 | Disposition: A | Discharge status: Home / Self Care | Payer: Medicare Other | Source: Ambulatory Visit | Attending: Urology | Admitting: Urology

## 2012-08-13 ENCOUNTER — Other Ambulatory Visit: Payer: Self-pay | Admitting: Medical Oncology

## 2012-08-13 ENCOUNTER — Other Ambulatory Visit: Payer: Self-pay | Admitting: Urology

## 2012-08-13 ENCOUNTER — Encounter (HOSPITAL_COMMUNITY): Payer: Self-pay

## 2012-08-13 VITALS — BP 140/67 | HR 60 | Temp 98.0°F | Resp 20

## 2012-08-13 DIAGNOSIS — N135 Crossing vessel and stricture of ureter without hydronephrosis: Secondary | ICD-10-CM

## 2012-08-13 DIAGNOSIS — Z436 Encounter for attention to other artificial openings of urinary tract: Secondary | ICD-10-CM | POA: Insufficient documentation

## 2012-08-13 DIAGNOSIS — C61 Malignant neoplasm of prostate: Secondary | ICD-10-CM | POA: Insufficient documentation

## 2012-08-13 DIAGNOSIS — I10 Essential (primary) hypertension: Secondary | ICD-10-CM | POA: Insufficient documentation

## 2012-08-13 DIAGNOSIS — Z79899 Other long term (current) drug therapy: Secondary | ICD-10-CM | POA: Insufficient documentation

## 2012-08-13 DIAGNOSIS — G4730 Sleep apnea, unspecified: Secondary | ICD-10-CM | POA: Insufficient documentation

## 2012-08-13 DIAGNOSIS — K219 Gastro-esophageal reflux disease without esophagitis: Secondary | ICD-10-CM | POA: Insufficient documentation

## 2012-08-13 DIAGNOSIS — E119 Type 2 diabetes mellitus without complications: Secondary | ICD-10-CM | POA: Insufficient documentation

## 2012-08-13 DIAGNOSIS — I219 Acute myocardial infarction, unspecified: Secondary | ICD-10-CM | POA: Insufficient documentation

## 2012-08-13 LAB — BASIC METABOLIC PANEL
BUN: 42 mg/dL — ABNORMAL HIGH (ref 6–23)
CO2: 23 mEq/L (ref 19–32)
Chloride: 98 mEq/L (ref 96–112)
GFR calc Af Amer: 28 mL/min — ABNORMAL LOW (ref >90)
Potassium: 4.1 mEq/L (ref 3.5–5.1)

## 2012-08-13 LAB — CBC
HCT: 30.6 % — ABNORMAL LOW (ref 39.0–52.0)
Hemoglobin: 10.4 g/dL — ABNORMAL LOW (ref 13.0–17.0)
MCV: 94.4 fL (ref 78.0–100.0)
WBC: 9.4 10*3/uL (ref 4.0–10.5)

## 2012-08-13 MED ORDER — MIDAZOLAM HCL 2 MG/2ML IJ SOLN
INTRAMUSCULAR | Status: AC | PRN
  Administered 2012-08-13 (×2): 1 mg via INTRAVENOUS

## 2012-08-13 MED ORDER — CIPROFLOXACIN IN D5W 400 MG/200ML IV SOLN
400.0000 mg | INTRAVENOUS | Status: AC
  Administered 2012-08-13: 400 mg via INTRAVENOUS
  Filled 2012-08-13: qty 200

## 2012-08-13 MED ORDER — LIDOCAINE HCL 1 % IJ SOLN
INTRAMUSCULAR | Status: AC
  Filled 2012-08-13: qty 20

## 2012-08-13 MED ORDER — SODIUM CHLORIDE 0.9 % IV SOLN
INTRAVENOUS | Status: DC
  Administered 2012-08-13: 09:00:00 via INTRAVENOUS

## 2012-08-13 MED ORDER — FENTANYL CITRATE 0.05 MG/ML IJ SOLN
INTRAMUSCULAR | Status: AC | PRN
  Administered 2012-08-13: 100 ug via INTRAVENOUS

## 2012-08-13 MED ORDER — DARBEPOETIN ALFA-POLYSORBATE 300 MCG/0.6ML IJ SOLN
300.0000 ug | INTRAMUSCULAR | Status: DC
  Filled 2012-08-13: qty 0.6

## 2012-08-13 MED ORDER — MIDAZOLAM HCL 2 MG/2ML IJ SOLN
INTRAMUSCULAR | Status: AC
  Filled 2012-08-13: qty 6

## 2012-08-13 MED ORDER — DARBEPOETIN ALFA-POLYSORBATE 300 MCG/0.6ML IJ SOLN
300.0000 ug | INTRAMUSCULAR | Status: DC
  Administered 2012-08-13: 300 ug via SUBCUTANEOUS
  Filled 2012-08-13: qty 0.6

## 2012-08-13 MED ORDER — IOHEXOL 300 MG/ML  SOLN
25.0000 mL | Freq: Once | INTRAMUSCULAR | Status: AC | PRN
  Administered 2012-08-13: 25 mL

## 2012-08-13 MED ORDER — FENTANYL CITRATE 0.05 MG/ML IJ SOLN
INTRAMUSCULAR | Status: AC
Start: 2012-08-13 — End: 2012-08-13
  Filled 2012-08-13: qty 6

## 2012-08-13 NOTE — Progress Notes (Signed)
Patient scheduled to get Aranesp tomorrow at [facility]. CBC drawn for radiology prep today and pts wife asked if he could receive Aranesp today. Called Dr. Santo Held nurse to see if ok for patient to receive drug here today as CBC just drawn. Nurse stated ok with Dr. Park Breed. Called Caryn Bee B. PA with radiology and he stated patient can receive med here today. Informed cancer center he will receive it today and they will cancel his appt for tomorrow.

## 2012-08-13 NOTE — Procedures (Signed)
Successful bilateral ureteral stents inserted neph tubes removed Good antegrade flow Full report in PACS

## 2012-08-13 NOTE — Progress Notes (Signed)
Patient currently at [facility] short stay, call received from Darl Pikes at [facility] short stay, patient asking if ok for him to receive Aranesp inj while there and to save himself a return drive tomorrow. Per MD, ok to administer Aranesp 300 mcg (current Hgb @ 10.4). Verbal order given to Lamont Snowball RN.  Onc tx sent to cancel 03/27 lab/inj appt.

## 2012-08-13 NOTE — H&P (Signed)
Chief Complaint: "I'm here for kidney drains/stents" Referring Physician:Borden HPI: Jeffery Gibson is an 79 y.o. male with hx of prostate cancer which is now causing bilateral hydroneophrosis. Dr. Laverle Patter attempt cystoscopy and retrograde stent placement but was not able to pass the ureters. He was referred to IR last week and had successful placement of (B)PCN drains. He is now scheduled for internalization/stent placement. PMHx and meds reviewed, no changes.   Past Medical History:  Past Medical History  Diagnosis Date  . Ischemic heart disease 05/06/06    post CARG 05/06/06  . Obese     exogenous  . Renal insufficiency   . Anemia of chronic disease     aranesp injections  . Dyslipidemia   . Coronary artery disease   . Anemia associated with chronic renal failure 04/05/2011  . Cancer     prostate/on Lupron  . Adenocarcinoma of colon 11/2003    stage 2(T3,N0,M0)  . Diabetes mellitus   . Hypertension   . Anginal pain   . Myocardial infarction 1986  . Pacemaker   . Cataracts, bilateral   . Seizures   . Arthritis   . Hemorrhoids   . Sleep apnea   . GERD (gastroesophageal reflux disease)     hx of  . Hematoma     hx of  . Atrial flutter     afib and atrial flutter (permanent)  . Tachycardia-bradycardia 1997    dual-chamber/for tachybradycardia syndrome    Past Surgical History:  Past Surgical History  Procedure Laterality Date  . Laparotomy  12/04/2003    resection of rectosigmoid carcinoma/  . Radioactive seed implant  05/2005    transperianeal placement I-125 for prostate cancer/# of  seeds 55  . Cardiac catheterization  05/01/06    EF 40%/diffuse 3 vessel CAD/tight L antereior descending artery stenosis/diffuse disease proximal L anterior descending  . Pacemaker removal  1997  . Fracture surgery      left ankle  . Insert / replace / remove pacemaker  2006  . Colon surgery  2005  . Coronary artery bypass graft  05/06/06    x4 with L internal mammary artery  to the L anterior descending coronary artery  . Laser surgery  2012    prostate  . Orchiectomy  august 2013  . Cystoscopy w/ ureteral stent placement Bilateral 08/04/2012    Procedure: CYSTOSCOPY WITH bilateral retrograde;  Surgeon: Crecencio Mc, MD;  Location: [facility];  Service: Urology;  Laterality: Bilateral;  attempted stent placement     Family History:  Family History  Problem Relation Age of Onset  . Heart failure Father 12  . Gallbladder disease Father   . COPD    . Coronary artery disease    . Emphysema      Social History:  reports that he quit smoking about 49 years ago. His smoking use included Cigarettes. He smoked 0.00 packs per day for 10 years. He has never used smokeless tobacco. He reports that he does not drink alcohol or use illicit drugs.  Allergies:  Allergies  Allergen Reactions  . Latex Hives  . Lovenox (Enoxaparin Sodium)     Gi bleed  . Lupron (Leuprolide Acetate)     Kidney failure  . Warfarin And Related     Gi bleed    Medications: amLODipine (NORVASC) 10 MG tablet (Taking) Sig - Route: Take 10 mg by mouth every morning. - Oral Class: Historical Med Number of times this order has been changed since signing: 2  Order [address] Trail Cholecalciferol (VITAMIN D) 2000 UNITS tablet (Taking) Sig - Route: Take 2,000 Units by mouth daily. - Oral Class: Historical Med Number of times this order has been changed since signing: 1 Order [address] Darbepoetin Alfa-Albumin (ARANESP IJ) (Taking) Sig - Route: Inject 1 Syringe as directed See admin instructions. Every 4 weeks as needed for anemia - Injection Class: Historical Med Number of times this order has been changed since signing: 5 Order [address] furosemide (LASIX) 80 MG tablet (Taking) 11/21/2011 11/20/2012 Sig - Route: Take 40 mg by mouth daily. - Oral Class: Historical Med Number of times this order has been changed since signing: 4 Order [address] Trail metoprolol (LOPRESSOR) 50 MG tablet (Taking) Sig - Route: Take 50 mg  by mouth 2 (two) times daily. - Oral Class: Historical Med Number of times this order has been changed since signing: 1 Order [address] rosuvastatin (CRESTOR) 10 MG tablet (Taking) 02/20/2012 Sig - Route: Take 5 mg by mouth daily. Take 1/2 tab daily. - Oral Class: Historical Med Number of times this order has been changed since signing: 3 Order [address] Tamsulosin HCl (FLOMAX) 0.4 MG CAPS (Taking) 05/28/2011 Sig - Route: Take 0.4 mg by mouth daily after supper. - Oral Class: Historical Med Number of times this order has been changed since signing: 3 Order [address] Trail ursodiol (ACTIGALL) 300 MG capsule (Taking) Sig - Route: Take 300 mg by mouth 2 (two) times daily.    Please HPI for pertinent positives, otherwise complete 10 system ROS negative.  Physical Exam: There were no vitals taken for this visit. There is no weight on file to calculate BMI.   General Appearance:  Alert, cooperative, no distress, appears stated age  Head:  Normocephalic, without obvious abnormality, atraumatic  ENT: Unremarkable  Neck: Supple, symmetrical, trachea midline, no adenopathy, thyroid: not enlarged, symmetric, no tenderness/mass/nodules  Lungs:   Clear to auscultation bilaterally, few exp wheezes.  Heart:  Regular rate and rhythm, S1, S2 normal, no murmur, rub or gallop. Carotids 2+ without bruit.  Abdomen:   Soft, non-tender, non distended. Bowel sounds active all four quadrants,  no masses, no organomegaly.  Neurologic: Normal affect, no gross deficits.     Assessment/Plan Bilateral hydronephrosis secondary to prostate cancer For attempt at (B) internalization/ureteral stenting. Discussed procedure with pt and family, including risks, complications. Labs pending. Consent signed in chart  Brayton El PA-C 08/13/2012, 8:32 AM

## 2012-08-14 ENCOUNTER — Other Ambulatory Visit: Payer: Medicare Other | Admitting: Lab

## 2012-08-14 ENCOUNTER — Ambulatory Visit: Payer: Medicare Other

## 2012-08-19 ENCOUNTER — Emergency Department (HOSPITAL_COMMUNITY): Payer: Medicare Other

## 2012-08-19 ENCOUNTER — Inpatient Hospital Stay (HOSPITAL_COMMUNITY)
Admission: EM | Admit: 2012-08-19 | Discharge: 2012-08-22 | DRG: 872 | Disposition: A | Discharge status: Home / Self Care | Payer: Medicare Other | Attending: Internal Medicine | Admitting: Internal Medicine

## 2012-08-19 ENCOUNTER — Encounter (HOSPITAL_COMMUNITY): Payer: Self-pay | Admitting: Internal Medicine

## 2012-08-19 ENCOUNTER — Other Ambulatory Visit: Payer: Self-pay

## 2012-08-19 DIAGNOSIS — I495 Sick sinus syndrome: Secondary | ICD-10-CM

## 2012-08-19 DIAGNOSIS — D126 Benign neoplasm of colon, unspecified: Secondary | ICD-10-CM

## 2012-08-19 DIAGNOSIS — Z950 Presence of cardiac pacemaker: Secondary | ICD-10-CM

## 2012-08-19 DIAGNOSIS — D649 Anemia, unspecified: Secondary | ICD-10-CM

## 2012-08-19 DIAGNOSIS — I219 Acute myocardial infarction, unspecified: Secondary | ICD-10-CM

## 2012-08-19 DIAGNOSIS — A419 Sepsis, unspecified organism: Principal | ICD-10-CM

## 2012-08-19 DIAGNOSIS — I4891 Unspecified atrial fibrillation: Secondary | ICD-10-CM | POA: Diagnosis present

## 2012-08-19 DIAGNOSIS — I480 Paroxysmal atrial fibrillation: Secondary | ICD-10-CM | POA: Diagnosis present

## 2012-08-19 DIAGNOSIS — I252 Old myocardial infarction: Secondary | ICD-10-CM

## 2012-08-19 DIAGNOSIS — I509 Heart failure, unspecified: Secondary | ICD-10-CM | POA: Diagnosis present

## 2012-08-19 DIAGNOSIS — I10 Essential (primary) hypertension: Secondary | ICD-10-CM

## 2012-08-19 DIAGNOSIS — I5022 Chronic systolic (congestive) heart failure: Secondary | ICD-10-CM

## 2012-08-19 DIAGNOSIS — I259 Chronic ischemic heart disease, unspecified: Secondary | ICD-10-CM | POA: Diagnosis present

## 2012-08-19 DIAGNOSIS — I129 Hypertensive chronic kidney disease with stage 1 through stage 4 chronic kidney disease, or unspecified chronic kidney disease: Secondary | ICD-10-CM | POA: Diagnosis present

## 2012-08-19 DIAGNOSIS — E785 Hyperlipidemia, unspecified: Secondary | ICD-10-CM

## 2012-08-19 DIAGNOSIS — C61 Malignant neoplasm of prostate: Secondary | ICD-10-CM

## 2012-08-19 DIAGNOSIS — D631 Anemia in chronic kidney disease: Secondary | ICD-10-CM | POA: Diagnosis present

## 2012-08-19 DIAGNOSIS — Z79899 Other long term (current) drug therapy: Secondary | ICD-10-CM

## 2012-08-19 DIAGNOSIS — K219 Gastro-esophageal reflux disease without esophagitis: Secondary | ICD-10-CM | POA: Diagnosis present

## 2012-08-19 DIAGNOSIS — E871 Hypo-osmolality and hyponatremia: Secondary | ICD-10-CM

## 2012-08-19 DIAGNOSIS — Z951 Presence of aortocoronary bypass graft: Secondary | ICD-10-CM

## 2012-08-19 DIAGNOSIS — I4892 Unspecified atrial flutter: Secondary | ICD-10-CM | POA: Diagnosis present

## 2012-08-19 DIAGNOSIS — I440 Atrioventricular block, first degree: Secondary | ICD-10-CM | POA: Diagnosis present

## 2012-08-19 DIAGNOSIS — N183 Chronic kidney disease, stage 3 unspecified: Secondary | ICD-10-CM

## 2012-08-19 DIAGNOSIS — R911 Solitary pulmonary nodule: Secondary | ICD-10-CM | POA: Diagnosis present

## 2012-08-19 DIAGNOSIS — E119 Type 2 diabetes mellitus without complications: Secondary | ICD-10-CM

## 2012-08-19 DIAGNOSIS — N189 Chronic kidney disease, unspecified: Secondary | ICD-10-CM

## 2012-08-19 DIAGNOSIS — N390 Urinary tract infection, site not specified: Secondary | ICD-10-CM

## 2012-08-19 DIAGNOSIS — Z794 Long term (current) use of insulin: Secondary | ICD-10-CM

## 2012-08-19 DIAGNOSIS — C189 Malignant neoplasm of colon, unspecified: Secondary | ICD-10-CM | POA: Diagnosis present

## 2012-08-19 DIAGNOSIS — G4730 Sleep apnea, unspecified: Secondary | ICD-10-CM | POA: Diagnosis present

## 2012-08-19 DIAGNOSIS — I2510 Atherosclerotic heart disease of native coronary artery without angina pectoris: Secondary | ICD-10-CM

## 2012-08-19 DIAGNOSIS — E669 Obesity, unspecified: Secondary | ICD-10-CM | POA: Diagnosis present

## 2012-08-19 DIAGNOSIS — N039 Chronic nephritic syndrome with unspecified morphologic changes: Secondary | ICD-10-CM | POA: Diagnosis present

## 2012-08-19 DIAGNOSIS — Z6837 Body mass index (BMI) 37.0-37.9, adult: Secondary | ICD-10-CM

## 2012-08-19 LAB — CBC WITH DIFFERENTIAL/PLATELET
Basophils Absolute: 0 10*3/uL (ref 0.0–0.1)
Basophils Relative: 0 % (ref 0–1)
Eosinophils Relative: 0 % (ref 0–5)
HCT: 32.4 % — ABNORMAL LOW (ref 39.0–52.0)
Hemoglobin: 11.1 g/dL — ABNORMAL LOW (ref 13.0–17.0)
Lymphocytes Relative: 3 % — ABNORMAL LOW (ref 12–46)
MCHC: 34.3 g/dL (ref 30.0–36.0)
MCV: 95.3 fL (ref 78.0–100.0)
Monocytes Absolute: 1 10*3/uL (ref 0.1–1.0)
Monocytes Relative: 5 % (ref 3–12)
Neutro Abs: 18.5 10*3/uL — ABNORMAL HIGH (ref 1.7–7.7)
RDW: 13.9 % (ref 11.5–15.5)

## 2012-08-19 LAB — URINALYSIS, ROUTINE W REFLEX MICROSCOPIC
Glucose, UA: NEGATIVE mg/dL
Ketones, ur: NEGATIVE mg/dL
Protein, ur: 100 mg/dL — AB
pH: 5.5 (ref 5.0–8.0)

## 2012-08-19 LAB — COMPREHENSIVE METABOLIC PANEL
BUN: 38 mg/dL — ABNORMAL HIGH (ref 6–23)
CO2: 24 mEq/L (ref 19–32)
Calcium: 9.1 mg/dL (ref 8.4–10.5)
Chloride: 93 mEq/L — ABNORMAL LOW (ref 96–112)
Creatinine, Ser: 2.41 mg/dL — ABNORMAL HIGH (ref 0.50–1.35)
GFR calc non Af Amer: 24 mL/min — ABNORMAL LOW (ref >90)
Total Bilirubin: 0.6 mg/dL (ref 0.3–1.2)

## 2012-08-19 LAB — URINE MICROSCOPIC-ADD ON

## 2012-08-19 LAB — LACTIC ACID, PLASMA: Lactic Acid, Venous: 2.4 mmol/L — ABNORMAL HIGH (ref 0.5–2.2)

## 2012-08-19 MED ORDER — AMIODARONE HCL 100 MG PO TABS
100.0000 mg | ORAL_TABLET | Freq: Every morning | ORAL | Status: DC
  Administered 2012-08-20 – 2012-08-22 (×3): 100 mg via ORAL
  Filled 2012-08-19 (×3): qty 1

## 2012-08-19 MED ORDER — ONDANSETRON HCL 4 MG/2ML IJ SOLN
4.0000 mg | Freq: Once | INTRAMUSCULAR | Status: AC
  Administered 2012-08-19: 4 mg via INTRAVENOUS
  Filled 2012-08-19: qty 2

## 2012-08-19 MED ORDER — INSULIN ASPART 100 UNIT/ML ~~LOC~~ SOLN
0.0000 [IU] | Freq: Three times a day (TID) | SUBCUTANEOUS | Status: DC
  Administered 2012-08-20: 8 [IU] via SUBCUTANEOUS
  Administered 2012-08-20: 2 [IU] via SUBCUTANEOUS
  Administered 2012-08-20 – 2012-08-21 (×2): 3 [IU] via SUBCUTANEOUS
  Administered 2012-08-21: 2 [IU] via SUBCUTANEOUS
  Administered 2012-08-21: 3 [IU] via SUBCUTANEOUS
  Administered 2012-08-22: 2 [IU] via SUBCUTANEOUS

## 2012-08-19 MED ORDER — CEFTRIAXONE SODIUM 1 G IJ SOLR
1.0000 g | INTRAMUSCULAR | Status: DC
  Filled 2012-08-19: qty 10

## 2012-08-19 MED ORDER — VITAMIN C 500 MG PO TABS
500.0000 mg | ORAL_TABLET | Freq: Every day | ORAL | Status: DC
  Administered 2012-08-19 – 2012-08-22 (×4): 500 mg via ORAL
  Filled 2012-08-19 (×4): qty 1

## 2012-08-19 MED ORDER — VITAMIN D3 25 MCG (1000 UNIT) PO TABS
2000.0000 [IU] | ORAL_TABLET | Freq: Every day | ORAL | Status: DC
  Administered 2012-08-19 – 2012-08-22 (×4): 2000 [IU] via ORAL
  Filled 2012-08-19 (×4): qty 2

## 2012-08-19 MED ORDER — SODIUM CHLORIDE 0.9 % IV SOLN
INTRAVENOUS | Status: DC
  Administered 2012-08-19 – 2012-08-20 (×2): via INTRAVENOUS

## 2012-08-19 MED ORDER — SODIUM CHLORIDE 0.9 % IV BOLUS (SEPSIS)
1000.0000 mL | Freq: Once | INTRAVENOUS | Status: AC
  Administered 2012-08-19: 1000 mL via INTRAVENOUS

## 2012-08-19 MED ORDER — ACETAMINOPHEN 325 MG PO TABS
650.0000 mg | ORAL_TABLET | ORAL | Status: DC | PRN
  Administered 2012-08-20 (×2): 650 mg via ORAL
  Filled 2012-08-19 (×2): qty 2

## 2012-08-19 MED ORDER — PANTOPRAZOLE SODIUM 40 MG PO TBEC
40.0000 mg | DELAYED_RELEASE_TABLET | Freq: Every day | ORAL | Status: DC
  Administered 2012-08-19 – 2012-08-22 (×4): 40 mg via ORAL
  Filled 2012-08-19 (×4): qty 1

## 2012-08-19 MED ORDER — POLYETHYLENE GLYCOL 3350 17 G PO PACK
17.0000 g | PACK | Freq: Every day | ORAL | Status: DC | PRN
  Filled 2012-08-19: qty 1

## 2012-08-19 MED ORDER — ALIGN 4 MG PO CAPS
1.0000 | ORAL_CAPSULE | Freq: Every day | ORAL | Status: DC
  Administered 2012-08-19 – 2012-08-22 (×4): 1 via ORAL
  Filled 2012-08-19 (×4): qty 1

## 2012-08-19 MED ORDER — HYDROCODONE-ACETAMINOPHEN 5-325 MG PO TABS
1.0000 | ORAL_TABLET | Freq: Four times a day (QID) | ORAL | Status: DC | PRN
  Administered 2012-08-19 – 2012-08-21 (×2): 1 via ORAL
  Filled 2012-08-19 (×2): qty 1

## 2012-08-19 MED ORDER — DEXTROSE 5 % IV SOLN
1.0000 g | Freq: Once | INTRAVENOUS | Status: AC
  Administered 2012-08-19: 1 g via INTRAVENOUS
  Filled 2012-08-19: qty 10

## 2012-08-19 MED ORDER — URSODIOL 300 MG PO CAPS
300.0000 mg | ORAL_CAPSULE | Freq: Two times a day (BID) | ORAL | Status: DC
  Administered 2012-08-19 – 2012-08-22 (×6): 300 mg via ORAL
  Filled 2012-08-19 (×7): qty 1

## 2012-08-19 MED ORDER — ONDANSETRON HCL 4 MG PO TABS
4.0000 mg | ORAL_TABLET | Freq: Four times a day (QID) | ORAL | Status: DC | PRN
  Administered 2012-08-21: 4 mg via ORAL
  Filled 2012-08-19: qty 1

## 2012-08-19 MED ORDER — ATORVASTATIN CALCIUM 10 MG PO TABS
10.0000 mg | ORAL_TABLET | Freq: Every day | ORAL | Status: DC
  Administered 2012-08-19 – 2012-08-21 (×3): 10 mg via ORAL
  Filled 2012-08-19 (×4): qty 1

## 2012-08-19 MED ORDER — MIRABEGRON ER 25 MG PO TB24
25.0000 mg | ORAL_TABLET | Freq: Every day | ORAL | Status: DC
  Administered 2012-08-19 – 2012-08-21 (×3): 25 mg via ORAL
  Filled 2012-08-19 (×4): qty 1

## 2012-08-19 MED ORDER — ASPIRIN EC 81 MG PO TBEC
81.0000 mg | DELAYED_RELEASE_TABLET | Freq: Every day | ORAL | Status: DC
  Administered 2012-08-19 – 2012-08-21 (×3): 81 mg via ORAL
  Filled 2012-08-19 (×4): qty 1

## 2012-08-19 MED ORDER — ALUM & MAG HYDROXIDE-SIMETH 200-200-20 MG/5ML PO SUSP
30.0000 mL | Freq: Four times a day (QID) | ORAL | Status: DC | PRN
  Filled 2012-08-19: qty 30

## 2012-08-19 MED ORDER — INSULIN GLARGINE 100 UNIT/ML ~~LOC~~ SOLN
15.0000 [IU] | Freq: Every day | SUBCUTANEOUS | Status: DC
  Administered 2012-08-19: 15 [IU] via SUBCUTANEOUS
  Filled 2012-08-19 (×2): qty 0.15

## 2012-08-19 MED ORDER — ONDANSETRON HCL 4 MG/2ML IJ SOLN
4.0000 mg | Freq: Four times a day (QID) | INTRAMUSCULAR | Status: DC | PRN
  Administered 2012-08-20 (×2): 4 mg via INTRAVENOUS
  Filled 2012-08-19 (×2): qty 2

## 2012-08-19 MED ORDER — TAMSULOSIN HCL 0.4 MG PO CAPS
0.4000 mg | ORAL_CAPSULE | Freq: Every day | ORAL | Status: DC
  Administered 2012-08-19 – 2012-08-21 (×3): 0.4 mg via ORAL
  Filled 2012-08-19 (×4): qty 1

## 2012-08-19 MED ORDER — FINASTERIDE 5 MG PO TABS
5.0000 mg | ORAL_TABLET | Freq: Every day | ORAL | Status: DC
  Administered 2012-08-19 – 2012-08-21 (×3): 5 mg via ORAL
  Filled 2012-08-19 (×4): qty 1

## 2012-08-19 MED ORDER — AMLODIPINE BESYLATE 5 MG PO TABS
5.0000 mg | ORAL_TABLET | Freq: Every morning | ORAL | Status: DC
  Administered 2012-08-20 – 2012-08-22 (×3): 5 mg via ORAL
  Filled 2012-08-19 (×3): qty 1

## 2012-08-19 MED ORDER — INSULIN ASPART 100 UNIT/ML ~~LOC~~ SOLN
0.0000 [IU] | Freq: Every day | SUBCUTANEOUS | Status: DC
Start: 2012-08-19 — End: 2012-08-22
  Administered 2012-08-19: 2 [IU] via SUBCUTANEOUS

## 2012-08-19 MED ORDER — VITAMIN D 50 MCG (2000 UT) PO TABS: 2000.0000 [IU] | ORAL_TABLET | Freq: Every day | ORAL | Status: DC

## 2012-08-19 MED ORDER — METOPROLOL TARTRATE 50 MG PO TABS
50.0000 mg | ORAL_TABLET | Freq: Two times a day (BID) | ORAL | Status: DC
  Administered 2012-08-19 – 2012-08-22 (×6): 50 mg via ORAL
  Filled 2012-08-19 (×7): qty 1

## 2012-08-19 MED ORDER — ACETAMINOPHEN 325 MG PO TABS
650.0000 mg | ORAL_TABLET | Freq: Once | ORAL | Status: AC
  Administered 2012-08-19: 650 mg via ORAL
  Filled 2012-08-19: qty 2

## 2012-08-19 MED ORDER — SODIUM CHLORIDE 0.9 % IJ SOLN
3.0000 mL | Freq: Two times a day (BID) | INTRAMUSCULAR | Status: DC
  Administered 2012-08-19 – 2012-08-22 (×3): 3 mL via INTRAVENOUS

## 2012-08-19 NOTE — ED Notes (Signed)
Pt's wife states that last night pt developed a fever along with burning with urination and cloudy urine. Pt has had fever off and on all day even after taking Tylenol. Last dose of Tylenol at 1042 this morning. Pt went to [facility] today and was told to come to ED for further treatment. Pt also has had nausea, diaphoresis, shaking and chills today. Pt self caths at home. Wife reports pt had urethral stents placed in 3/26. Pt a/o x 4. Skin pale, warm and dry.

## 2012-08-19 NOTE — H&P (Signed)
[facility] History and Physical  CHEVEYO Jeffery Gibson:[id number] DOB: July 05, 1928 DOA: 08/19/2012  Referring physician: Dr. Bruce Gibson PCP: Jeffery Penna, MD  Urologist: Dr. Crecencio Gibson  Chief Complaint: Shaking chills   History of Present Illness: Jeffery Gibson is an 79 y.o. male with a PMHx of stage III chronic kidney disease CAD, metastatic prostate cancer, hydronephrosis status post ureteral stents, colon CA and HTN who presents to the ED with shaking chills that began at 4 AM.   [name] self catheterizes twice a day, and he last catheterized himself last evening.  He noticed that his urine was cloudy and there was some burning with insertion of the catheter at that time. At 4:00 a.m., he developed shaking chills and diaphoresis.  He called his urologist, and was advised to come to the hospital for further evaluation. No aggravating or alleviating factors.   Review of Systems: Constitutional: + fever and chills;  Appetite normal; No weight loss, no weight gain.  HEENT: No blurry vision, no diplopia, no pharyngitis, no dysphagia CV: No chest pain, no palpitations.  Resp: No SOB, no cough. GI: + nausea, no vomiting, no diarrhea, no melena, no hematochezia.  GU: + dysuria, + recent hematuria none x 48 hours.  MSK: + myalgias, no arthralgias.  Neuro:  + slight headache, no focal neurological deficits, + history of seizure x 1 prior to pacemaker insertion.  Psych: No depression, no anxiety.  Endo: No thyroid disease, + DM, no heat intolerance, no cold intolerance, + polyuria and polydipsia last night  Skin: No rashes, no skin lesions.  Heme: + easy bruising, no history of blood diseases.  Past Medical History Past Medical History  Diagnosis Date  . Ischemic heart disease 05/06/06    post CARG 05/06/06  . Obese     exogenous  . Renal insufficiency   . Anemia of chronic disease     aranesp injections  . Dyslipidemia   . Coronary artery disease   . Anemia associated with  chronic renal failure 04/05/2011  . Cancer     prostate/on Lupron  . Adenocarcinoma of colon 11/2003    stage 2(T3,N0,M0)  . Diabetes mellitus   . Hypertension   . Anginal pain   . Myocardial infarction 1986  . Pacemaker   . Cataracts, bilateral   . Seizures   . Arthritis   . Hemorrhoids   . Sleep apnea   . GERD (gastroesophageal reflux disease)     hx of  . Hematoma     hx of  . Atrial flutter     afib and atrial flutter (permanent)  . Tachycardia-bradycardia 1997    dual-chamber/for tachybradycardia syndrome     Past Surgical History Past Surgical History  Procedure Laterality Date  . Laparotomy  12/04/2003    resection of rectosigmoid carcinoma/  . Radioactive seed implant  05/2005    transperianeal placement I-125 for prostate cancer/# of  seeds 55  . Cardiac catheterization  05/01/06    EF 40%/diffuse 3 vessel CAD/tight L antereior descending artery stenosis/diffuse disease proximal L anterior descending  . Pacemaker removal  1997  . Fracture surgery      left ankle  . Insert / replace / remove pacemaker  2006  . Colon surgery  2005  . Coronary artery bypass graft  05/06/06    x4 with L internal mammary artery to the L anterior descending coronary artery  . Laser surgery  2012    prostate  . Orchiectomy  august  2013  . Cystoscopy w/ ureteral stent placement Bilateral 08/04/2012    Procedure: CYSTOSCOPY WITH bilateral retrograde;  Surgeon: Jeffery Mc, MD;  Location: [facility];  Service: Urology;  Laterality: Bilateral;  attempted stent placement      Social History: History   Social History  . Marital Status: Married    Spouse Name: N/A    Number of Children: N/A  . Years of Education: N/A   Occupational History  . Emergency planning/management officer, retired.    Social History Main Topics  . Smoking status: Former Smoker -- 10 years    Types: Cigarettes    Quit date: 05/22/1963  . Smokeless tobacco: Never Used  . Alcohol Use: No  . Drug Use: No  . Sexually Active:  Not Currently   Other Topics Concern  . Not on file   Social History Narrative   Married.  Ambulates with a cane.    Family History:  Family History  Problem Relation Age of Onset  . Heart failure Father 42  . Gallbladder disease Father     Allergies: Adhesive; Latex; Lovenox; Lupron; and Warfarin and related  Meds: Prior to Admission medications   Medication Sig Start Date End Date Taking? Authorizing Provider  acetaminophen (TYLENOL) 500 MG tablet Take 500 mg by mouth every 6 (six) hours as needed for pain or fever.   Yes Historical Provider, MD  amiodarone (PACERONE) 200 MG tablet Take 100 mg by mouth every morning.    Yes Historical Provider, MD  amLODipine (NORVASC) 5 MG tablet Take 5 mg by mouth every morning.   Yes Historical Provider, MD  aspirin EC 81 MG tablet Take 81 mg by mouth every evening.    Yes Historical Provider, MD  Cholecalciferol (VITAMIN D) 2000 UNITS tablet Take 2,000 Units by mouth daily at 12 noon.    Yes Historical Provider, MD  finasteride (PROSCAR) 5 MG tablet Take 5 mg by mouth every evening.    Yes Historical Provider, MD  furosemide (LASIX) 40 MG tablet Take 40 mg by mouth every morning.    Yes Historical Provider, MD  insulin glargine (LANTUS SOLOSTAR) 100 UNIT/ML injection Inject 15 Units into the skin at bedtime.   Yes Historical Provider, MD  metoprolol (LOPRESSOR) 50 MG tablet Take 50 mg by mouth 2 (two) times daily.   Yes Historical Provider, MD  mirabegron ER (MYRBETRIQ) 25 MG TB24 Take 25 mg by mouth every evening.    Yes Historical Provider, MD  omeprazole (PRILOSEC) 20 MG capsule Take 20 mg by mouth every evening.    Yes Historical Provider, MD  Probiotic Product (ALIGN) 4 MG CAPS Take 1 capsule by mouth daily.   Yes Historical Provider, MD  rosuvastatin (CRESTOR) 10 MG tablet Take 5 mg by mouth every evening.  02/20/12  Yes Vesta Mixer, MD  Tamsulosin HCl (FLOMAX) 0.4 MG CAPS Take 0.4 mg by mouth daily after supper.  05/28/11  Yes  Historical Provider, MD  ursodiol (ACTIGALL) 300 MG capsule Take 300 mg by mouth 2 (two) times daily.    Yes Historical Provider, MD  vitamin C (ASCORBIC ACID) 500 MG tablet Take 500 mg by mouth daily.   Yes Historical Provider, MD  Aromatic Inhalants (VICKS VAPOR INHALER IN) Inhale 1 puff into the lungs daily as needed (for dry air).    Historical Provider, MD  Darbepoetin Alfa-Albumin (ARANESP IJ) Inject 1 Syringe as directed See admin instructions. Every 4 weeks as needed for anemia if hemoglobin is below 11  Historical Provider, MD  HYDROcodone-acetaminophen (NORCO/VICODIN) 5-325 MG per tablet Take 1 tablet by mouth every 6 (six) hours as needed for pain.    Historical Provider, MD    Physical Exam: Filed Vitals:   08/19/12 1619 08/19/12 1747  BP: 197/58 135/58  Pulse: 90 88  Temp: 102.2 F (39 C) 101.3 F (38.5 C)  TempSrc: Oral Axillary  Resp: 19 20  SpO2: 95% 94%     Physical Exam: Blood pressure 135/58, pulse 88, temperature 101.3 F (38.5 C), temperature source Axillary, resp. rate 20, SpO2 94.00%. Gen: No acute distress. Head: Normocephalic, atraumatic. Eyes: PERRL, EOMI, sclerae nonicteric. Mouth: Oropharynx with dry mucous membranes. Neck: Supple, no thyromegaly, no lymphadenopathy, no jugular venous distention. Chest: Lungs clear to auscultation bilaterally. CV: Heart sounds regular, without murmurs, rubs, or gallops. Abdomen: Soft, nontender, nondistended with normal active bowel sounds. Extremities: Extremities with trace edema bilaterally. Skin: Warm and dry. Neuro: Alert and oriented times 3; cranial nerves II through XII grossly intact. Psych: Mood and affect normal.  Labs on Admission:  Basic Metabolic Panel:  Recent Labs Lab 08/13/12 0820 08/19/12 1655  NA 135 130*  K 4.1 4.4  CL 98 93*  CO2 23 24  GLUCOSE 161* 259*  BUN 42* 38*  CREATININE 2.38* 2.41*  CALCIUM 8.9 9.1   Liver Function Tests:  Recent Labs Lab 08/19/12 1655  AST 12  ALT  9  ALKPHOS 95  BILITOT 0.6  PROT 7.2  ALBUMIN 3.3*   CBC:  Recent Labs Lab 08/13/12 0820 08/19/12 1655  WBC 9.4 20.1*  NEUTROABS  --  18.5*  HGB 10.4* 11.1*  HCT 30.6* 32.4*  MCV 94.4 95.3  PLT 203 216   BNP (last 3 results)  Recent Labs  11/07/11 1340  PROBNP 1968.0*   CBG:  Recent Labs Lab 08/13/12 0838  GLUCAP 154*    Radiological Exams on Admission: Ct Abdomen Pelvis Wo Contrast  08/19/2012  *RADIOLOGY REPORT*  Clinical Data: Fever and nausea  CT ABDOMEN AND PELVIS WITHOUT CONTRAST  Technique:  Multidetector CT imaging of the abdomen and pelvis was performed following the standard protocol without intravenous contrast.  Comparison: 07/17/2012  Findings: 4 mm irregular right middle lobe pulmonary nodule on image one series 5.  Cholelithiasis.  Unenhanced liver, spleen, pancreas, adrenal glands are within normal limits.  Bilateral ureteral stents have been placed from the renal pelvises to the bladder.  Previously noted hydronephrosis has resolved. Stable renal hypodensities.  Normal appendix.  Bladder is decompressed. Bladder wall thickening is unchanged.  Brachia therapy pellets in the prostate.  Moderate stool burden in the sigmoid colon.  Diverticulosis without evidence of acute diverticulitis.  No free fluid.  No acute bony deformity.  IMPRESSION: Bilateral ureteral stent placement has resulted and resolved bilateral hydronephrosis.  4 mm right middle lobe pulmonary nodule. If the patient is at high risk for bronchogenic carcinoma, follow-up chest CT at 1 year is recommended.  If the patient is at low risk, no follow-up is needed.  This recommendation follows the consensus statement: Guidelines for Management of Small Pulmonary Nodules Detected on CT Scans:  A Statement from the Fleischner Society as published in Radiology 2005; 237:395-400.   Original Report Authenticated By: Jolaine Click, M.D.    Dg Chest 2 View  08/19/2012  *RADIOLOGY REPORT*  Clinical Data: Shortness of  breath.  Weakness and abdominal pain.  CHEST - 2 VIEW  Comparison: 08/19/2012  Findings: The heart size is enlarged.  The patient is status post  median sternotomy and CABG procedure.  There is a right chest wall pacer device with lead in the right atrial appendage and right ventricle.  Lung volumes are low and there is asymmetric elevation of the right hemidiaphragm.  No pleural effusion or edema noted. No airspace consolidation.  IMPRESSION:  1.  Cardiac enlargement. 2.  Low lung volumes with asymmetric elevation of the right hemidiaphragm.   Original Report Authenticated By: Signa Kell, M.D.     EKG: Independently reviewed. Sinus rhythm. First degree AV block. Left anterior fascicular block.  Assessment/Plan Principal Problem:   Sepsis secondary to urinary tract infection, complicated -UTIs complicated given indwelling ureteral stents. -Blood and urine cultures sent. -Continue empiric Rocephin. -Lactic acid elevated at 2.4 but no acidosis. -We'll discuss with the patient's urologist tomorrow. Active Problems:   PROSTATE CANCER -Continue Proscar, Flomax and mirabegron.   HYPERLIPIDEMIA -Continue statin.   Diabetes mellitus -Continue Lantus. Add moderate scale SSI.   HTN (hypertension) -Continue Norvasc and metoprolol. Hold Lasix.   Paroxysmal a-fib -Continue aspirin. -Continue amiodarone and metoprolol.   Chronic systolic congestive heart failure -Well compensated clinically. EF 45-50% 11/12/2011.   Stage III chronic kidney disease -Creatinine is actually better than baseline values.   Normocytic anemia / anemia associated with chronic renal failure -On Aranesp as an outpatient. Hemoglobin stable.   Pulmonary nodule, right middle lobe -Patient aware of this.  Code Status: Full. Family Communication: Mehki Klumpp [phone], cell [phone]. Disposition Plan: Home when stable.  Time spent: 1 hour.  Latoya Maulding [facility] Pager [phone]  If 7PM-7AM,  please contact night-coverage [email or web address] Password [facility] 08/19/2012, 6:28 PM

## 2012-08-19 NOTE — ED Notes (Signed)
Blood cultures x 2 drawn and sent to lab by lab tech.

## 2012-08-19 NOTE — ED Provider Notes (Signed)
History     CSN: [id number]  Arrival date & time 08/19/12  1607   First MD Initiated Contact with Patient 08/19/12 1613      No chief complaint on file.   (Consider location/radiation/quality/duration/timing/severity/associated sxs/prior treatment) HPI Comments: 79 y/o male with a PMHx of renal insufficiency, CAD, prostate and colon CA and HTN among multiple other medical conditions presents to the ED with his wife and son from the urologist office complaining of fever x 1 day. Patient states last night he was self-catheterizing himself and noticed it was burning, and when he woke up this morning he felt nauseated with a fever of 101.8. Wife gave him tylenol to break the fever, however has had cold sweats all day and is generally not feeling well. He was unable to take any of his medications today due to nausea. Denies vomiting. Underwent renal stent placement procedure on 3/17 which failed and had to have percutaneous stent placement done on 3/26 by Dr. Laverle Patter. Denies abdominal or flank pain. Admits to associated sob and leg swelling today due to not taking his medications.  The history is provided by the patient, the spouse and a relative.    Past Medical History  Diagnosis Date  . Ischemic heart disease 05/06/06    post CARG 05/06/06  . Obese     exogenous  . Renal insufficiency   . Anemia of chronic disease     aranesp injections  . Dyslipidemia   . Coronary artery disease   . Anemia associated with chronic renal failure 04/05/2011  . Cancer     prostate/on Lupron  . Adenocarcinoma of colon 11/2003    stage 2(T3,N0,M0)  . Diabetes mellitus   . Hypertension   . Anginal pain   . Myocardial infarction 1986  . Pacemaker   . Cataracts, bilateral   . Seizures   . Arthritis   . Hemorrhoids   . Sleep apnea   . GERD (gastroesophageal reflux disease)     hx of  . Hematoma     hx of  . Atrial flutter     afib and atrial flutter (permanent)  . Tachycardia-bradycardia 1997     dual-chamber/for tachybradycardia syndrome    Past Surgical History  Procedure Laterality Date  . Laparotomy  12/04/2003    resection of rectosigmoid carcinoma/  . Radioactive seed implant  05/2005    transperianeal placement I-125 for prostate cancer/# of  seeds 55  . Cardiac catheterization  05/01/06    EF 40%/diffuse 3 vessel CAD/tight L antereior descending artery stenosis/diffuse disease proximal L anterior descending  . Pacemaker removal  1997  . Fracture surgery      left ankle  . Insert / replace / remove pacemaker  2006  . Colon surgery  2005  . Coronary artery bypass graft  05/06/06    x4 with L internal mammary artery to the L anterior descending coronary artery  . Laser surgery  2012    prostate  . Orchiectomy  august 2013  . Cystoscopy w/ ureteral stent placement Bilateral 08/04/2012    Procedure: CYSTOSCOPY WITH bilateral retrograde;  Surgeon: Crecencio Mc, MD;  Location: [facility];  Service: Urology;  Laterality: Bilateral;  attempted stent placement     Family History  Problem Relation Age of Onset  . Heart failure Father 28  . Gallbladder disease Father   . COPD    . Coronary artery disease    . Emphysema      History  Substance Use Topics  .  Smoking status: Former Smoker -- 10 years    Types: Cigarettes    Quit date: 05/22/1963  . Smokeless tobacco: Never Used  . Alcohol Use: No      Review of Systems  Constitutional: Positive for fever, chills and diaphoresis.  Respiratory: Positive for shortness of breath.   Cardiovascular: Positive for leg swelling. Negative for chest pain.  Gastrointestinal: Positive for nausea. Negative for vomiting and abdominal pain.  Genitourinary: Positive for dysuria.  Musculoskeletal: Negative for back pain.  Psychiatric/Behavioral: Negative for confusion.  All other systems reviewed and are negative.    Allergies  Latex; Lovenox; Lupron; and Warfarin and related  Home Medications   Current Outpatient Rx  Name   Route  Sig  Dispense  Refill  . amiodarone (PACERONE) 200 MG tablet   Oral   Take 100 mg by mouth daily.         Marland Kitchen amLODipine (NORVASC) 5 MG tablet   Oral   Take 5 mg by mouth every morning.         . Aromatic Inhalants (VICKS VAPOR INHALER IN)   Inhalation   Inhale 1 puff into the lungs daily as needed (for dry air).         Marland Kitchen aspirin EC 81 MG tablet   Oral   Take 81 mg by mouth daily.         . Cholecalciferol (VITAMIN D) 2000 UNITS tablet   Oral   Take 2,000 Units by mouth daily.           . Darbepoetin Alfa-Albumin (ARANESP IJ)   Injection   Inject 1 Syringe as directed See admin instructions. Every 4 weeks as needed for anemia if hemoglobin is below 11         . finasteride (PROSCAR) 5 MG tablet   Oral   Take 5 mg by mouth daily.         . furosemide (LASIX) 40 MG tablet   Oral   Take 40 mg by mouth daily.         Marland Kitchen HYDROcodone-acetaminophen (NORCO/VICODIN) 5-325 MG per tablet   Oral   Take 1 tablet by mouth every 6 (six) hours as needed for pain.         Marland Kitchen insulin glargine (LANTUS SOLOSTAR) 100 UNIT/ML injection   Subcutaneous   Inject 15 Units into the skin at bedtime.         . metoprolol (LOPRESSOR) 50 MG tablet   Oral   Take 50 mg by mouth 2 (two) times daily.         . mirabegron ER (MYRBETRIQ) 25 MG TB24   Oral   Take 25 mg by mouth daily.         Marland Kitchen omeprazole (PRILOSEC) 20 MG capsule   Oral   Take 20 mg by mouth daily.         . phenazopyridine (PYRIDIUM) 100 MG tablet   Oral   Take 1 tablet (100 mg total) by mouth 3 (three) times daily as needed for pain (for burning).   10 tablet   0   . Probiotic Product (ALIGN) 4 MG CAPS   Oral   Take 1 capsule by mouth daily.         . rosuvastatin (CRESTOR) 10 MG tablet   Oral   Take 5 mg by mouth daily. Take 1/2 tab daily.         . Tamsulosin HCl (FLOMAX) 0.4 MG CAPS   Oral  Take 0.4 mg by mouth daily after supper.          . ursodiol (ACTIGALL) 300 MG  capsule   Oral   Take 300 mg by mouth 2 (two) times daily.          . vitamin C (ASCORBIC ACID) 500 MG tablet   Oral   Take 500 mg by mouth daily.           BP 197/58  Pulse 90  Temp(Src) 102.2 F (39 C) (Oral)  Resp 19  SpO2 95%  Physical Exam  Nursing note and vitals reviewed. Constitutional: He is oriented to person, place, and time. He appears well-developed. No distress.  Obese, shivering.  HENT:  Head: Normocephalic and atraumatic.  Mouth/Throat: Oropharynx is clear and moist.  Eyes: Conjunctivae and EOM are normal. Pupils are equal, round, and reactive to light.  Neck: Normal range of motion. Neck supple.  Cardiovascular: Normal rate, regular rhythm, normal heart sounds and intact distal pulses.   Trace pitting edema LE bilateral.  Pulmonary/Chest: Breath sounds normal. No accessory muscle usage. Tachypnea noted. No respiratory distress. He has no decreased breath sounds. He has no wheezes. He has no rhonchi. He has no rales.  Abdominal: Soft. Normal appearance and bowel sounds are normal. He exhibits no distension and no mass. There is no tenderness. There is no CVA tenderness.  Genitourinary: Penis normal. Uncircumcised. No penile tenderness. No discharge found.  Musculoskeletal: Normal range of motion.  Neurological: He is alert and oriented to person, place, and time.  Skin: Skin is warm and dry. No rash noted. He is not diaphoretic.  2 mm diameter round scars present on bilateral flank. No surrounding erythema or edema concerning infection.  Psychiatric: He has a normal mood and affect. His behavior is normal.    ED Course  Procedures (including critical care time)  Labs Reviewed  URINALYSIS, ROUTINE W REFLEX MICROSCOPIC - Abnormal; Notable for the following:    APPearance TURBID (*)    Hgb urine dipstick LARGE (*)    Protein, ur 100 (*)    Nitrite POSITIVE (*)    Leukocytes, UA LARGE (*)    All other components within normal limits  CBC WITH  DIFFERENTIAL - Abnormal; Notable for the following:    WBC 20.1 (*)    RBC 3.40 (*)    Hemoglobin 11.1 (*)    HCT 32.4 (*)    Neutrophils Relative 92 (*)    Neutro Abs 18.5 (*)    Lymphocytes Relative 3 (*)    Lymphs Abs 0.5 (*)    All other components within normal limits  COMPREHENSIVE METABOLIC PANEL - Abnormal; Notable for the following:    Sodium 130 (*)    Chloride 93 (*)    Glucose, Bld 259 (*)    BUN 38 (*)    Creatinine, Ser 2.41 (*)    Albumin 3.3 (*)    GFR calc non Af Amer 24 (*)    GFR calc Af Amer 27 (*)    All other components within normal limits  LACTIC ACID, PLASMA - Abnormal; Notable for the following:    Lactic Acid, Venous 2.4 (*)    All other components within normal limits  URINE MICROSCOPIC-ADD ON - Abnormal; Notable for the following:    Bacteria, UA MANY (*)    All other components within normal limits  CULTURE, BLOOD (ROUTINE X 2)  CULTURE, BLOOD (ROUTINE X 2)  URINE CULTURE   Ct Abdomen Pelvis Wo Contrast  08/19/2012  *RADIOLOGY REPORT*  Clinical Data: Fever and nausea  CT ABDOMEN AND PELVIS WITHOUT CONTRAST  Technique:  Multidetector CT imaging of the abdomen and pelvis was performed following the standard protocol without intravenous contrast.  Comparison: 07/17/2012  Findings: 4 mm irregular right middle lobe pulmonary nodule on image one series 5.  Cholelithiasis.  Unenhanced liver, spleen, pancreas, adrenal glands are within normal limits.  Bilateral ureteral stents have been placed from the renal pelvises to the bladder.  Previously noted hydronephrosis has resolved. Stable renal hypodensities.  Normal appendix.  Bladder is decompressed. Bladder wall thickening is unchanged.  Brachia therapy pellets in the prostate.  Moderate stool burden in the sigmoid colon.  Diverticulosis without evidence of acute diverticulitis.  No free fluid.  No acute bony deformity.  IMPRESSION: Bilateral ureteral stent placement has resulted and resolved bilateral  hydronephrosis.  4 mm right middle lobe pulmonary nodule. If the patient is at high risk for bronchogenic carcinoma, follow-up chest CT at 1 year is recommended.  If the patient is at low risk, no follow-up is needed.  This recommendation follows the consensus statement: Guidelines for Management of Small Pulmonary Nodules Detected on CT Scans:  A Statement from the Fleischner Society as published in Radiology 2005; 237:395-400.   Original Report Authenticated By: Jolaine Click, M.D.    Dg Chest 2 View  08/19/2012  *RADIOLOGY REPORT*  Clinical Data: Shortness of breath.  Weakness and abdominal pain.  CHEST - 2 VIEW  Comparison: 08/19/2012  Findings: The heart size is enlarged.  The patient is status post median sternotomy and CABG procedure.  There is a right chest wall pacer device with lead in the right atrial appendage and right ventricle.  Lung volumes are low and there is asymmetric elevation of the right hemidiaphragm.  No pleural effusion or edema noted. No airspace consolidation.  IMPRESSION:  1.  Cardiac enlargement. 2.  Low lung volumes with asymmetric elevation of the right hemidiaphragm.   Original Report Authenticated By: Signa Kell, M.D.    Date: 08/19/2012  Rate: 88  Rhythm: normal sinus rhythm  QRS Axis: left  Intervals: PR prolonged  ST/T Wave abnormalities: normal  Conduction Disutrbances:first-degree A-V block  and left anterior fascicular block  Narrative Interpretation: no stemi  Old EKG Reviewed: changes noted LAFB, first degree AV block     1. Complicated urinary tract infection       MDM  79 y/o male with complicated UTI. WBC 20.1, Lactic acid 2.4. Temp decreased from 102.2 to 101.3 with tylenol. He is hemodynamically stable and in NAD. 1g IV rocephin given. He will be admitted by Dr. Darnelle Catalan, [facility] team 1. Case discussed with Dr. Freida Busman who agrees with plan of care.        Trevor Mace, PA-C 08/19/12 1805

## 2012-08-19 NOTE — ED Notes (Signed)
Patient transported to X-ray 

## 2012-08-20 DIAGNOSIS — E871 Hypo-osmolality and hyponatremia: Secondary | ICD-10-CM | POA: Diagnosis present

## 2012-08-20 LAB — CBC
HCT: 29.7 % — ABNORMAL LOW (ref 39.0–52.0)
Hemoglobin: 10.1 g/dL — ABNORMAL LOW (ref 13.0–17.0)
MCH: 32.3 pg (ref 26.0–34.0)
MCHC: 34 g/dL (ref 30.0–36.0)
MCV: 94.9 fL (ref 78.0–100.0)
RBC: 3.13 MIL/uL — ABNORMAL LOW (ref 4.22–5.81)

## 2012-08-20 LAB — BASIC METABOLIC PANEL
BUN: 38 mg/dL — ABNORMAL HIGH (ref 6–23)
CO2: 22 mEq/L (ref 19–32)
GFR calc non Af Amer: 23 mL/min — ABNORMAL LOW (ref >90)
Glucose, Bld: 238 mg/dL — ABNORMAL HIGH (ref 70–99)
Potassium: 3.9 mEq/L (ref 3.5–5.1)
Sodium: 127 mEq/L — ABNORMAL LOW (ref 135–145)

## 2012-08-20 LAB — GLUCOSE, CAPILLARY
Glucose-Capillary: 171 mg/dL — ABNORMAL HIGH (ref 70–99)
Glucose-Capillary: 139 mg/dL — ABNORMAL HIGH (ref 70–99)

## 2012-08-20 MED ORDER — CEFEPIME HCL 1 G IJ SOLR
1.0000 g | INTRAMUSCULAR | Status: DC
  Filled 2012-08-20: qty 1

## 2012-08-20 MED ORDER — INSULIN GLARGINE 100 UNIT/ML ~~LOC~~ SOLN
20.0000 [IU] | Freq: Every day | SUBCUTANEOUS | Status: DC
Start: 2012-08-20 — End: 2012-08-22
  Administered 2012-08-20 – 2012-08-21 (×2): 20 [IU] via SUBCUTANEOUS
  Filled 2012-08-20 (×4): qty 0.2

## 2012-08-20 MED ORDER — DEXTROSE 5 % IV SOLN
1.0000 g | Freq: Once | INTRAVENOUS | Status: AC
  Administered 2012-08-20: 1 g via INTRAVENOUS
  Filled 2012-08-20: qty 1

## 2012-08-20 MED ORDER — PROMETHAZINE HCL 25 MG/ML IJ SOLN
12.5000 mg | Freq: Four times a day (QID) | INTRAMUSCULAR | Status: DC | PRN
  Administered 2012-08-20: 12.5 mg via INTRAVENOUS
  Filled 2012-08-20: qty 1

## 2012-08-20 NOTE — Progress Notes (Signed)
Attention for MD's...patient currently has condom catheter in place. Does not have indwelling catheter. Just an FYI. Thanks. Ginny Forth

## 2012-08-20 NOTE — ED Provider Notes (Signed)
Medical screening examination/treatment/procedure(s) were performed by non-physician practitioner and as supervising physician I was immediately available for consultation/collaboration.  Maximiliano Cromartie T Anakin Varkey, MD 08/20/12 1709 

## 2012-08-20 NOTE — Consult Note (Signed)
Urology Consult   Physician requesting consult: Dr. Darnelle Catalan  Reason for consult: Febrile urinary tract infection  History of Present Illness: Jeffery Gibson is a 79 y.o. well known to me with metastatic prostate cancer and bilateral ureteral obstruction s/p recent bilateral nephrostomy placement and antegrade ureteral stent placement with removal of his nephrostomy tubes.  He developed high fever yesterday with shaking chills.  He presented to our office and was seen by Denna Haggard, NPC.  He was sent to the ED due to concerns of infection/sepsis.  He has been started on cefepime empirically and feels slightly better but still has chills and nausea and a poor appetite. He denies any dysuria or difficulty voiding and currently has a catheter in place.       Past Medical History  Diagnosis Date  . Ischemic heart disease 05/06/06    post CARG 05/06/06  . Obese     exogenous  . Renal insufficiency   . Anemia of chronic disease     aranesp injections  . Dyslipidemia   . Coronary artery disease   . Anemia associated with chronic renal failure 04/05/2011  . Cancer     prostate/on Lupron  . Adenocarcinoma of colon 11/2003    stage 2(T3,N0,M0)  . Diabetes mellitus   . Hypertension   . Anginal pain   . Myocardial infarction 1986  . Pacemaker   . Cataracts, bilateral   . Seizures   . Arthritis   . Hemorrhoids   . Sleep apnea   . GERD (gastroesophageal reflux disease)     hx of  . Hematoma     hx of  . Atrial flutter     afib and atrial flutter (permanent)  . Tachycardia-bradycardia 1997    dual-chamber/for tachybradycardia syndrome    Past Surgical History  Procedure Laterality Date  . Laparotomy  12/04/2003    resection of rectosigmoid carcinoma/  . Radioactive seed implant  05/2005    transperianeal placement I-125 for prostate cancer/# of  seeds 55  . Cardiac catheterization  05/01/06    EF 40%/diffuse 3 vessel CAD/tight L antereior descending artery stenosis/diffuse  disease proximal L anterior descending  . Pacemaker removal  1997  . Fracture surgery      left ankle  . Insert / replace / remove pacemaker  2006  . Colon surgery  2005  . Coronary artery bypass graft  05/06/06    x4 with L internal mammary artery to the L anterior descending coronary artery  . Laser surgery  2012    prostate  . Orchiectomy  august 2013  . Cystoscopy w/ ureteral stent placement Bilateral 08/04/2012    Procedure: CYSTOSCOPY WITH bilateral retrograde;  Surgeon: Crecencio Mc, MD;  Location: [facility];  Service: Urology;  Laterality: Bilateral;  attempted stent placement     Current Hospital Medications:  Home Meds:    Medication List    ASK your doctor about these medications       acetaminophen 500 MG tablet  Commonly known as:  TYLENOL  Take 500 mg by mouth every 6 (six) hours as needed for pain or fever.     ALIGN 4 MG Caps  Take 1 capsule by mouth daily.     amiodarone 200 MG tablet  Commonly known as:  PACERONE  Take 100 mg by mouth every morning.     amLODipine 5 MG tablet  Commonly known as:  NORVASC  Take 5 mg by mouth every morning.     ARANESP  IJ  Inject 1 Syringe as directed See admin instructions. Every 4 weeks as needed for anemia if hemoglobin is below 11     aspirin EC 81 MG tablet  Take 81 mg by mouth every evening.     finasteride 5 MG tablet  Commonly known as:  PROSCAR  Take 5 mg by mouth every evening.     furosemide 40 MG tablet  Commonly known as:  LASIX  Take 40 mg by mouth every morning.     HYDROcodone-acetaminophen 5-325 MG per tablet  Commonly known as:  NORCO/VICODIN  Take 1 tablet by mouth every 6 (six) hours as needed for pain.     LANTUS SOLOSTAR 100 UNIT/ML injection  Generic drug:  insulin glargine  Inject 15 Units into the skin at bedtime.     metoprolol 50 MG tablet  Commonly known as:  LOPRESSOR  Take 50 mg by mouth 2 (two) times daily.     MYRBETRIQ 25 MG Tb24  Generic drug:  mirabegron ER  Take 25 mg by  mouth every evening.     omeprazole 20 MG capsule  Commonly known as:  PRILOSEC  Take 20 mg by mouth every evening.     rosuvastatin 10 MG tablet  Commonly known as:  CRESTOR  Take 5 mg by mouth every evening.     tamsulosin 0.4 MG Caps  Commonly known as:  FLOMAX  Take 0.4 mg by mouth daily after supper.     ursodiol 300 MG capsule  Commonly known as:  ACTIGALL  Take 300 mg by mouth 2 (two) times daily.     VICKS VAPOR INHALER IN  Inhale 1 puff into the lungs daily as needed (for dry air).     vitamin C 500 MG tablet  Commonly known as:  ASCORBIC ACID  Take 500 mg by mouth daily.     Vitamin D 2000 UNITS tablet  Take 2,000 Units by mouth daily at 12 noon.        Scheduled Meds: . ALIGN  1 capsule Oral Daily  . amiodarone  100 mg Oral q morning - 10a  . amLODipine  5 mg Oral q morning - 10a  . aspirin EC  81 mg Oral QHS  . atorvastatin  10 mg Oral q1800  . [START ON 08/21/2012] ceFEPime (MAXIPIME) IV  1 g Intravenous Q24H  . cholecalciferol  2,000 Units Oral Daily  . finasteride  5 mg Oral QHS  . insulin aspart  0-15 Units Subcutaneous TID WC  . insulin aspart  0-5 Units Subcutaneous QHS  . insulin glargine  20 Units Subcutaneous QHS  . metoprolol  50 mg Oral BID  . mirabegron ER  25 mg Oral QHS  . pantoprazole  40 mg Oral Daily  . sodium chloride  3 mL Intravenous Q12H  . tamsulosin  0.4 mg Oral QHS  . ursodiol  300 mg Oral BID  . vitamin C  500 mg Oral Daily   Continuous Infusions:  PRN Meds:.acetaminophen, alum & mag hydroxide-simeth, HYDROcodone-acetaminophen, ondansetron (ZOFRAN) IV, ondansetron, polyethylene glycol, promethazine  Allergies:  Allergies  Allergen Reactions  . Adhesive (Tape) Rash    Rash and itching. USE PAPER TAPE ONLY  . Latex Hives  . Lovenox (Enoxaparin Sodium)     Gi bleed  . Lupron (Leuprolide Acetate)     Kidney failure  . Warfarin And Related     Gi bleed    Family History  Problem Relation Age of Onset  . Heart  failure  Father 59  . Gallbladder disease Father     Social History:  reports that he quit smoking about 49 years ago. His smoking use included Cigarettes. He smoked 0.00 packs per day for 10 years. He has never used smokeless tobacco. He reports that he does not drink alcohol or use illicit drugs.  ROS: A complete review of systems was performed.  All systems are negative except for pertinent findings as noted.  Physical Exam:  Vital signs in last 24 hours: Temp:  [98.1 F (36.7 C)-99.9 F (37.7 C)] 98.1 F (36.7 C) (04/02 1410) Pulse Rate:  [72-119] 100 (04/02 1410) Resp:  [17-24] 20 (04/02 1410) BP: (124-156)/(54-68) 156/65 mmHg (04/02 1410) SpO2:  [92 %-99 %] 99 % (04/02 1410) Weight:  [108 kg (238 lb 1.6 oz)] 108 kg (238 lb 1.6 oz) (04/01 2040) General:  Alert and oriented, No acute distress HEENT: Normocephalic, atraumatic Neck: No JVD or lymphadenopathy Cardiovascular: Regular rate and rhythm Lungs: Normal respiratory effort Back: No CVA tenderness Extremities: No edema Neurologic: Grossly intact  Laboratory Data:   Recent Labs  08/19/12 1655 08/20/12 0444  WBC 20.1* 21.6*  HGB 11.1* 10.1*  HCT 32.4* 29.7*  PLT 216 192     Recent Labs  08/19/12 1655 08/20/12 0444  NA 130* 127*  K 4.4 3.9  CL 93* 94*  GLUCOSE 259* 238*  BUN 38* 38*  CALCIUM 9.1 8.7  CREATININE 2.41* 2.48*     Results for orders placed during the hospital encounter of 08/19/12 (from the past 24 hour(s))  GLUCOSE, CAPILLARY     Status: Abnormal   Collection Time    08/19/12  9:18 PM      Result Value Range   Glucose-Capillary 239 (*) 70 - 99 mg/dL  BASIC METABOLIC PANEL     Status: Abnormal   Collection Time    08/20/12  4:44 AM      Result Value Range   Sodium 127 (*) 135 - 145 mEq/L   Potassium 3.9  3.5 - 5.1 mEq/L   Chloride 94 (*) 96 - 112 mEq/L   CO2 22  19 - 32 mEq/L   Glucose, Bld 238 (*) 70 - 99 mg/dL   BUN 38 (*) 6 - 23 mg/dL   Creatinine, Ser 6.21 (*) 0.50 - 1.35 mg/dL    Calcium 8.7  8.4 - 30.8 mg/dL   GFR calc non Af Amer 23 (*) >90 mL/min   GFR calc Af Amer 26 (*) >90 mL/min  CBC     Status: Abnormal   Collection Time    08/20/12  4:44 AM      Result Value Range   WBC 21.6 (*) 4.0 - 10.5 K/uL   RBC 3.13 (*) 4.22 - 5.81 MIL/uL   Hemoglobin 10.1 (*) 13.0 - 17.0 g/dL   HCT 65.7 (*) 84.6 - 96.2 %   MCV 94.9  78.0 - 100.0 fL   MCH 32.3  26.0 - 34.0 pg   MCHC 34.0  30.0 - 36.0 g/dL   RDW 95.2  84.1 - 32.4 %   Platelets 192  150 - 400 K/uL  GLUCOSE, CAPILLARY     Status: Abnormal   Collection Time    08/20/12  7:49 AM      Result Value Range   Glucose-Capillary 252 (*) 70 - 99 mg/dL   Comment 1 Notify RN    GLUCOSE, CAPILLARY     Status: Abnormal   Collection Time    08/20/12 11:37  AM      Result Value Range   Glucose-Capillary 171 (*) 70 - 99 mg/dL  GLUCOSE, CAPILLARY     Status: Abnormal   Collection Time    08/20/12  4:51 PM      Result Value Range   Glucose-Capillary 139 (*) 70 - 99 mg/dL   Recent Results (from the past 240 hour(s))  CULTURE, BLOOD (ROUTINE X 2)     Status: None   Collection Time    08/19/12  4:31 PM      Result Value Range Status   Specimen Description BLOOD RIGHT ARM   Final   Special Requests BOTTLES DRAWN AEROBIC AND ANAEROBIC 3CC   Final   Culture  Setup Time 08/19/2012 20:36   Final   Culture     Final   Value:        BLOOD CULTURE RECEIVED NO GROWTH TO DATE CULTURE WILL BE HELD FOR 5 DAYS BEFORE ISSUING A FINAL NEGATIVE REPORT   Report Status PENDING   Incomplete  CULTURE, BLOOD (ROUTINE X 2)     Status: None   Collection Time    08/19/12  5:00 PM      Result Value Range Status   Specimen Description BLOOD RIGHT ARM   Final   Special Requests BOTTLES DRAWN AEROBIC AND ANAEROBIC 3CC   Final   Culture  Setup Time 08/19/2012 20:37   Final   Culture     Final   Value:        BLOOD CULTURE RECEIVED NO GROWTH TO DATE CULTURE WILL BE HELD FOR 5 DAYS BEFORE ISSUING A FINAL NEGATIVE REPORT   Report Status PENDING    Incomplete    Renal Function:  Recent Labs  08/19/12 1655 08/20/12 0444  CREATININE 2.41* 2.48*   Estimated Creatinine Clearance: 27.8 ml/min (by C-G formula based on Cr of 2.48).  Radiologic Imaging: Ct Abdomen Pelvis Wo Contrast  08/19/2012  *RADIOLOGY REPORT*  Clinical Data: Fever and nausea  CT ABDOMEN AND PELVIS WITHOUT CONTRAST  Technique:  Multidetector CT imaging of the abdomen and pelvis was performed following the standard protocol without intravenous contrast.  Comparison: 07/17/2012  Findings: 4 mm irregular right middle lobe pulmonary nodule on image one series 5.  Cholelithiasis.  Unenhanced liver, spleen, pancreas, adrenal glands are within normal limits.  Bilateral ureteral stents have been placed from the renal pelvises to the bladder.  Previously noted hydronephrosis has resolved. Stable renal hypodensities.  Normal appendix.  Bladder is decompressed. Bladder wall thickening is unchanged.  Brachia therapy pellets in the prostate.  Moderate stool burden in the sigmoid colon.  Diverticulosis without evidence of acute diverticulitis.  No free fluid.  No acute bony deformity.  IMPRESSION: Bilateral ureteral stent placement has resulted and resolved bilateral hydronephrosis.  4 mm right middle lobe pulmonary nodule. If the patient is at high risk for bronchogenic carcinoma, follow-up chest CT at 1 year is recommended.  If the patient is at low risk, no follow-up is needed.  This recommendation follows the consensus statement: Guidelines for Management of Small Pulmonary Nodules Detected on CT Scans:  A Statement from the Fleischner Society as published in Radiology 2005; 237:395-400.   Original Report Authenticated By: Jolaine Click, M.D.    Dg Chest 2 View  08/19/2012  *RADIOLOGY REPORT*  Clinical Data: Shortness of breath.  Weakness and abdominal pain.  CHEST - 2 VIEW  Comparison: 08/19/2012  Findings: The heart size is enlarged.  The patient is status post median sternotomy  and CABG  procedure.  There is a right chest wall pacer device with lead in the right atrial appendage and right ventricle.  Lung volumes are low and there is asymmetric elevation of the right hemidiaphragm.  No pleural effusion or edema noted. No airspace consolidation.  IMPRESSION:  1.  Cardiac enlargement. 2.  Low lung volumes with asymmetric elevation of the right hemidiaphragm.   Original Report Authenticated By: Signa Kell, M.D.     I independently reviewed the above imaging studies.  Impression/Plan: 1) Febrile UTI/ pyelonephritis: Continue cefepime and await culture results.  Stents are in appropriate position and should remain. Renal function remains stable.  I would expect him to improve on appropriate antibiotic therapy over the next 48-72 hours. Will follow.  2) Prostate cancer: Will delay further cancer treatment until his acute infection resolves.  Jeffery Gibson,LES 08/20/2012, 6:37 PM    [name] MD   CC: Dr. Darnelle Catalan

## 2012-08-20 NOTE — Progress Notes (Signed)
ANTIBIOTIC CONSULT NOTE - INITIAL  Pharmacy Consult for:  Cefepime Indication:   Pyelonephritis with ureteral stents  Allergies  Allergen Reactions  . Adhesive (Tape) Rash    Rash and itching. USE PAPER TAPE ONLY  . Latex Hives  . Lovenox (Enoxaparin Sodium)     Gi bleed  . Lupron (Leuprolide Acetate)     Kidney failure  . Warfarin And Related     Gi bleed    Patient Measurements: Height: 5\' 8"  (172.7 cm) Weight: 238 lb 1.6 oz (108 kg) IBW/kg (Calculated) : 68.4   Vital Signs: Temp: 98.1 F (36.7 C) (04/02 1410) Temp src: Oral (04/02 1410) BP: 156/65 mmHg (04/02 1410) Pulse Rate: 100 (04/02 1410) Intake/Output from previous day:   Intake/Output from this shift: Total I/O In: 480 [P.O.:480] Out: 350 [Urine:350]  Labs:  Recent Labs  08/19/12 1655 08/20/12 0444  WBC 20.1* 21.6*  HGB 11.1* 10.1*  PLT 216 192  CREATININE 2.41* 2.48*   Estimated Creatinine Clearance: 27.8 ml/min (by C-G formula based on Cr of 2.48).    Microbiology: Recent Results (from the past 720 hour(s))  SURGICAL PCR SCREEN     Status: None   Collection Time    07/30/12 11:40 AM      Result Value Range Status   MRSA, PCR NEGATIVE  NEGATIVE Final   Staphylococcus aureus NEGATIVE  NEGATIVE Final   Comment:            The Xpert SA Assay (FDA     approved for NASAL specimens     in patients over 29 years of age),     is one component of     a comprehensive surveillance     program.  Test performance has     been validated by [employer] for patients greater     than or equal to 70 year old.     It is not intended     to diagnose infection nor to     guide or monitor treatment.  CULTURE, BLOOD (ROUTINE X 2)     Status: None   Collection Time    08/19/12  4:31 PM      Result Value Range Status   Specimen Description BLOOD RIGHT ARM   Final   Special Requests BOTTLES DRAWN AEROBIC AND ANAEROBIC 3CC   Final   Culture  Setup Time 08/19/2012 20:36   Final   Culture     Final   Value:        BLOOD CULTURE RECEIVED NO GROWTH TO DATE CULTURE WILL BE HELD FOR 5 DAYS BEFORE ISSUING A FINAL NEGATIVE REPORT   Report Status PENDING   Incomplete  CULTURE, BLOOD (ROUTINE X 2)     Status: None   Collection Time    08/19/12  5:00 PM      Result Value Range Status   Specimen Description BLOOD RIGHT ARM   Final   Special Requests BOTTLES DRAWN AEROBIC AND ANAEROBIC 3CC   Final   Culture  Setup Time 08/19/2012 20:37   Final   Culture     Final   Value:        BLOOD CULTURE RECEIVED NO GROWTH TO DATE CULTURE WILL BE HELD FOR 5 DAYS BEFORE ISSUING A FINAL NEGATIVE REPORT   Report Status PENDING   Incomplete    Medical History: Past Medical History  Diagnosis Date  . Ischemic heart disease 05/06/06    post CARG 05/06/06  .  Obese     exogenous  . Renal insufficiency   . Anemia of chronic disease     aranesp injections  . Dyslipidemia   . Coronary artery disease   . Anemia associated with chronic renal failure 04/05/2011  . Cancer     prostate/on Lupron  . Adenocarcinoma of colon 11/2003    stage 2(T3,N0,M0)  . Diabetes mellitus   . Hypertension   . Anginal pain   . Myocardial infarction 1986  . Pacemaker   . Cataracts, bilateral   . Seizures   . Arthritis   . Hemorrhoids   . Sleep apnea   . GERD (gastroesophageal reflux disease)     hx of  . Hematoma     hx of  . Atrial flutter     afib and atrial flutter (permanent)  . Tachycardia-bradycardia 1997    dual-chamber/for tachybradycardia syndrome    Medications:  Scheduled:  . [COMPLETED] acetaminophen  650 mg Oral Once  . ALIGN  1 capsule Oral Daily  . amiodarone  100 mg Oral q morning - 10a  . amLODipine  5 mg Oral q morning - 10a  . aspirin EC  81 mg Oral QHS  . atorvastatin  10 mg Oral q1800  . ceFEPime (MAXIPIME) IV  1 g Intravenous Once  . [COMPLETED] cefTRIAXone (ROCEPHIN)  IV  1 g Intravenous Once  . cholecalciferol  2,000 Units Oral Daily  . finasteride  5 mg Oral QHS  . insulin aspart   0-15 Units Subcutaneous TID WC  . insulin aspart  0-5 Units Subcutaneous QHS  . insulin glargine  20 Units Subcutaneous QHS  . metoprolol  50 mg Oral BID  . mirabegron ER  25 mg Oral QHS  . [COMPLETED] ondansetron (ZOFRAN) IV  4 mg Intravenous Once  . pantoprazole  40 mg Oral Daily  . [COMPLETED] sodium chloride  1,000 mL Intravenous Once  . sodium chloride  3 mL Intravenous Q12H  . tamsulosin  0.4 mg Oral QHS  . ursodiol  300 mg Oral BID  . vitamin C  500 mg Oral Daily  . [DISCONTINUED] cefTRIAXone (ROCEPHIN)  IV  1 g Intravenous Q24H  . [DISCONTINUED] insulin glargine  15 Units Subcutaneous QHS  . [DISCONTINUED] Vitamin D  2,000 Units Oral Q1200   Assessment: Asked to assist with Cefepime therapy for this 79 year-old male with pyelonephritis, status post ureteral stent placement on 08/13/12 due to hydronephrosis, and chronic kidney disease.  Goals of Therapy:   Eradication of infection  Dose appropriate for renal function  Plan:   Begin Cefepime 1 gram IV every 24 hours, dose modified as recommended when CrCl 11-29 ml/min  Follow for results of blood and urine cultures.  Polo Riley R.Ph 08/20/2012,4:12 PM

## 2012-08-20 NOTE — Progress Notes (Signed)
[facility] PROGRESS NOTE  Jeffery Gibson:[id number] DOB: 1928-08-27 DOA: 08/19/2012 PCP: Alysia Penna, MD  Brief narrative: Jeffery Gibson is an 79 y.o. male with a PMHx of stage III chronic kidney disease CAD, metastatic prostate cancer, hydronephrosis status post ureteral stents 08/13/12, colon CA and HTN who presented to the hospital on 08/19/2012 with fever and shaking chills.   Assessment/Plan: Principal Problem:  Sepsis secondary to urinary tract infection, complicated  -UTIs complicated given indwelling ureteral stents.  -Blood and urine cultures sent.  -Change Rocephin to Cefepime secondary to interaction of Rocephin with ursodiol.  -Discussed case with Dr. Laverle Patter (urology) who does not recommend stent exchange at this time.  Active Problems:  Hyponatremia -Discontinue IV fluids and monitor. May be related to hyperglycemia. PROSTATE CANCER  -Continue Proscar, Flomax and mirabegron.  HYPERLIPIDEMIA  -Continue statin.  Diabetes mellitus  -Continue  moderate scale SSI, increase Lantus to 20 units.  -CBGs 171-252.   HTN (hypertension)  -Continue Norvasc and metoprolol. Hold Lasix.  Paroxysmal a-fib  -Continue aspirin.  -Continue amiodarone and metoprolol.  Chronic systolic congestive heart failure  -Well compensated clinically. EF 45-50% 11/12/2011.  Stage III chronic kidney disease  -Creatinine is actually better than baseline values.  Normocytic anemia / anemia associated with chronic renal failure  -On Aranesp as an outpatient. Hemoglobin stable.  Pulmonary nodule, right middle lobe  -Patient aware of this.  Code Status: Full.  Family Communication: Nareg Breighner [phone], cell [phone]. Wife and son updated at bedside. Disposition Plan: Home when stable.  Medical Consultants:  Dr. Crecencio Mc, Urology.  Other Consultants:  None.  Anti-infectives:  Rocephin 08/19/2012---> 08/20/2012  Cefepime 08/20/2012--->  HPI/Subjective: Jeffery Gibson is still having some shaking chills and diaphoresis. His fever curve is down this morning but he did spike a temperature last night. No nausea or vomiting today.  Objective: Filed Vitals:   08/19/12 2338 08/20/12 0447 08/20/12 0830 08/20/12 1410  BP: 138/64 135/54  156/65  Pulse: 119 72  100  Temp: 99.8 F (37.7 C) 98.4 F (36.9 C) 98.1 F (36.7 C) 98.1 F (36.7 C)  TempSrc: Oral Oral Oral Oral  Resp: 20 22  20   Height:      Weight:      SpO2: 92% 92%  99%    Intake/Output Summary (Last 24 hours) at 08/20/12 1432 Last data filed at 08/20/12 0900  Gross per 24 hour  Intake    120 ml  Output      0 ml  Net    120 ml    Exam: Gen:  NAD Cardiovascular:  RRR, No M/R/G Respiratory:  Lungs CTAB Gastrointestinal:  Abdomen soft, NT/ND, + BS Extremities:  Trace edema.  Data Reviewed: Basic Metabolic Panel:  Recent Labs Lab 08/19/12 1655 08/20/12 0444  NA 130* 127*  K 4.4 3.9  CL 93* 94*  CO2 24 22  GLUCOSE 259* 238*  BUN 38* 38*  CREATININE 2.41* 2.48*  CALCIUM 9.1 8.7   GFR Estimated Creatinine Clearance: 27.8 ml/min (by C-G formula based on Cr of 2.48). Liver Function Tests:  Recent Labs Lab 08/19/12 1655  AST 12  ALT 9  ALKPHOS 95  BILITOT 0.6  PROT 7.2  ALBUMIN 3.3*   CBC:  Recent Labs Lab 08/19/12 1655 08/20/12 0444  WBC 20.1* 21.6*  NEUTROABS 18.5*  --   HGB 11.1* 10.1*  HCT 32.4* 29.7*  MCV 95.3 94.9  PLT 216 192   BNP (last 3 results)  Recent Labs  11/07/11 1340  PROBNP 1968.0*   CBG:  Recent Labs Lab 08/19/12 2118 08/20/12 0749 08/20/12 1137  GLUCAP 239* 252* 171*   Microbiology Recent Results (from the past 240 hour(s))  CULTURE, BLOOD (ROUTINE X 2)     Status: None   Collection Time    08/19/12  4:31 PM      Result Value Range Status   Specimen Description BLOOD RIGHT ARM   Final   Special Requests BOTTLES DRAWN AEROBIC AND ANAEROBIC 3CC   Final   Culture  Setup Time 08/19/2012 20:36   Final   Culture      Final   Value:        BLOOD CULTURE RECEIVED NO GROWTH TO DATE CULTURE WILL BE HELD FOR 5 DAYS BEFORE ISSUING A FINAL NEGATIVE REPORT   Report Status PENDING   Incomplete  CULTURE, BLOOD (ROUTINE X 2)     Status: None   Collection Time    08/19/12  5:00 PM      Result Value Range Status   Specimen Description BLOOD RIGHT ARM   Final   Special Requests BOTTLES DRAWN AEROBIC AND ANAEROBIC 3CC   Final   Culture  Setup Time 08/19/2012 20:37   Final   Culture     Final   Value:        BLOOD CULTURE RECEIVED NO GROWTH TO DATE CULTURE WILL BE HELD FOR 5 DAYS BEFORE ISSUING A FINAL NEGATIVE REPORT   Report Status PENDING   Incomplete     Procedures and Diagnostic Studies:  Ct Abdomen Pelvis Wo Contrast 08/19/2012 IMPRESSION: Bilateral ureteral stent placement has resulted and resolved bilateral hydronephrosis.  4 mm right middle lobe pulmonary nodule. If the patient is at high risk for bronchogenic carcinoma, follow-up chest CT at 1 year is recommended.  If the patient is at low risk, no follow-up is needed.  This recommendation follows the consensus statement: Guidelines for Management of Small Pulmonary Nodules Detected on CT Scans:  A Statement from the Fleischner Society as published in Radiology 2005; 237:395-400.   Original Report Authenticated By: Jolaine Click, M.D.     Dg Chest 2 View 08/19/2012 IMPRESSION:  1.  Cardiac enlargement. 2.  Low lung volumes with asymmetric elevation of the right hemidiaphragm.   Original Report Authenticated By: Signa Kell, M.D.     Scheduled Meds: . ALIGN  1 capsule Oral Daily  . amiodarone  100 mg Oral q morning - 10a  . amLODipine  5 mg Oral q morning - 10a  . aspirin EC  81 mg Oral QHS  . atorvastatin  10 mg Oral q1800  . cholecalciferol  2,000 Units Oral Daily  . finasteride  5 mg Oral QHS  . insulin aspart  0-15 Units Subcutaneous TID WC  . insulin aspart  0-5 Units Subcutaneous QHS  . insulin glargine  15 Units Subcutaneous QHS  . metoprolol  50  mg Oral BID  . mirabegron ER  25 mg Oral QHS  . pantoprazole  40 mg Oral Daily  . sodium chloride  3 mL Intravenous Q12H  . tamsulosin  0.4 mg Oral QHS  . ursodiol  300 mg Oral BID  . vitamin C  500 mg Oral Daily   Continuous Infusions: . sodium chloride 100 mL/hr at 08/20/12 0658    Time spent: 25 minutes.   LOS: 1 day   Kourtnei Rauber  [facility] Pager [phone].  If 8PM-8AM, please contact night-coverage at [email or web address], password [facility] 08/20/2012,  2:32 PM

## 2012-08-20 NOTE — Progress Notes (Signed)
   CARE MANAGEMENT NOTE 08/20/2012  Patient:  Jeffery Gibson, Jeffery Gibson   Account Number:  [id number]  Date Initiated:  08/20/2012  Documentation initiated by:  Jiles Crocker  Subjective/Objective Assessment:   ADMITTED WITH SEPSIS, UTI     Action/Plan:   PCP: Alysia Penna, MD  Urologist: Dr. Crecencio Mc  LIVES AT HOME WITH SPOUSE; POSSIBLY NEED HHC AT DISCHARGE; AWAITING ON PT/OT EVALS FOR DISPOSITION   Anticipated DC Date:  08/27/2012   Anticipated DC Plan:  HOME W HOME HEALTH SERVICES     Status of service:  In process, will continue to follow Medicare Important Message given?  NA - LOS <3 / Initial given by admissions (If response is "NO", the following Medicare IM given date fields will be blank) Per UR Regulation:  Reviewed for med. necessity/level of care/duration of stay Comments:  08/20/2012- B Kyrel Leighton RN,BSN,MHA

## 2012-08-21 ENCOUNTER — Encounter: Payer: Self-pay | Admitting: *Deleted

## 2012-08-21 LAB — URINE CULTURE: Special Requests: NORMAL

## 2012-08-21 LAB — GLUCOSE, CAPILLARY

## 2012-08-21 MED ORDER — CIPROFLOXACIN IN D5W 400 MG/200ML IV SOLN
400.0000 mg | Freq: Two times a day (BID) | INTRAVENOUS | Status: DC
  Administered 2012-08-21 – 2012-08-22 (×2): 400 mg via INTRAVENOUS
  Filled 2012-08-21 (×2): qty 200

## 2012-08-21 MED ORDER — DEXTROSE 5 % IV SOLN
2.0000 g | INTRAVENOUS | Status: DC
  Administered 2012-08-21: 2 g via INTRAVENOUS
  Filled 2012-08-21: qty 2

## 2012-08-21 NOTE — Progress Notes (Signed)
ANTIBIOTIC CONSULT NOTE - Follow-up  Pharmacy Consult for:  Cefepime Indication:   Pyelonephritis with ureteral stents  Allergies  Allergen Reactions  . Adhesive (Tape) Rash    Rash and itching. USE PAPER TAPE ONLY  . Latex Hives  . Lovenox (Enoxaparin Sodium)     Gi bleed  . Lupron (Leuprolide Acetate)     Kidney failure  . Warfarin And Related     Gi bleed    Patient Measurements: Height: 5\' 8"  (172.7 cm) Weight: 238 lb 1.6 oz (108 kg) IBW/kg (Calculated) : 68.4   Vital Signs: Temp: 98.9 F (37.2 C) (04/03 1047) Temp src: Axillary (04/03 1047) BP: 117/48 mmHg (04/03 1047) Pulse Rate: 74 (04/03 1047) Intake/Output from previous day: 04/02 0701 - 04/03 0700 In: 1533.3 [P.O.:480; I.V.:1053.3] Out: 1350 [Urine:1350] Intake/Output from this shift: Total I/O In: 300 [P.O.:300] Out: -   Labs:  Recent Labs  08/19/12 1655 08/20/12 0444  WBC 20.1* 21.6*  HGB 11.1* 10.1*  PLT 216 192  CREATININE 2.41* 2.48*   Estimated Creatinine Clearance: 27.8 ml/min (by C-G formula based on Cr of 2.48).    Microbiology: Recent Results (from the past 720 hour(s))  SURGICAL PCR SCREEN     Status: None   Collection Time    07/30/12 11:40 AM      Result Value Range Status   MRSA, PCR NEGATIVE  NEGATIVE Final   Staphylococcus aureus NEGATIVE  NEGATIVE Final   Comment:            The Xpert SA Assay (FDA     approved for NASAL specimens     in patients over 25 years of age),     is one component of     a comprehensive surveillance     program.  Test performance has     been validated by [employer] for patients greater     than or equal to 35 year old.     It is not intended     to diagnose infection nor to     guide or monitor treatment.  CULTURE, BLOOD (ROUTINE X 2)     Status: None   Collection Time    08/19/12  4:31 PM      Result Value Range Status   Specimen Description BLOOD RIGHT ARM   Final   Special Requests BOTTLES DRAWN AEROBIC AND ANAEROBIC 3CC    Final   Culture  Setup Time 08/19/2012 20:36   Final   Culture     Final   Value:        BLOOD CULTURE RECEIVED NO GROWTH TO DATE CULTURE WILL BE HELD FOR 5 DAYS BEFORE ISSUING A FINAL NEGATIVE REPORT   Report Status PENDING   Incomplete  URINE CULTURE     Status: None   Collection Time    08/19/12  4:40 PM      Result Value Range Status   Specimen Description URINE, CATHETERIZED   Final   Special Requests Normal   Final   Culture  Setup Time 08/19/2012 21:18   Final   Colony Count >=100,000 COLONIES/ML   Final   Culture PSEUDOMONAS AERUGINOSA   Final   Report Status PENDING   Incomplete  CULTURE, BLOOD (ROUTINE X 2)     Status: None   Collection Time    08/19/12  5:00 PM      Result Value Range Status   Specimen Description BLOOD RIGHT ARM   Final  Special Requests BOTTLES DRAWN AEROBIC AND ANAEROBIC 3CC   Final   Culture  Setup Time 08/19/2012 20:37   Final   Culture     Final   Value:        BLOOD CULTURE RECEIVED NO GROWTH TO DATE CULTURE WILL BE HELD FOR 5 DAYS BEFORE ISSUING A FINAL NEGATIVE REPORT   Report Status PENDING   Incomplete    Medical History: Past Medical History  Diagnosis Date  . Ischemic heart disease 05/06/06    post CARG 05/06/06  . Obese     exogenous  . Renal insufficiency   . Anemia of chronic disease     aranesp injections  . Dyslipidemia   . Coronary artery disease   . Anemia associated with chronic renal failure 04/05/2011  . Cancer     prostate/on Lupron  . Adenocarcinoma of colon 11/2003    stage 2(T3,N0,M0)  . Diabetes mellitus   . Hypertension   . Anginal pain   . Myocardial infarction 1986  . Pacemaker   . Cataracts, bilateral   . Seizures   . Arthritis   . Hemorrhoids   . Sleep apnea   . GERD (gastroesophageal reflux disease)     hx of  . Hematoma     hx of  . Atrial flutter     afib and atrial flutter (permanent)  . Tachycardia-bradycardia 1997    dual-chamber/for tachybradycardia syndrome    Medications:   Scheduled:  . ALIGN  1 capsule Oral Daily  . amiodarone  100 mg Oral q morning - 10a  . amLODipine  5 mg Oral q morning - 10a  . aspirin EC  81 mg Oral QHS  . atorvastatin  10 mg Oral q1800  . [COMPLETED] ceFEPime (MAXIPIME) IV  1 g Intravenous Once  . ceFEPime (MAXIPIME) IV  2 g Intravenous Q24H  . cholecalciferol  2,000 Units Oral Daily  . finasteride  5 mg Oral QHS  . insulin aspart  0-15 Units Subcutaneous TID WC  . insulin aspart  0-5 Units Subcutaneous QHS  . insulin glargine  20 Units Subcutaneous QHS  . metoprolol  50 mg Oral BID  . mirabegron ER  25 mg Oral QHS  . pantoprazole  40 mg Oral Daily  . sodium chloride  3 mL Intravenous Q12H  . tamsulosin  0.4 mg Oral QHS  . ursodiol  300 mg Oral BID  . vitamin C  500 mg Oral Daily  . [DISCONTINUED] ceFEPime (MAXIPIME) IV  1 g Intravenous Q24H  . [DISCONTINUED] cefTRIAXone (ROCEPHIN)  IV  1 g Intravenous Q24H  . [DISCONTINUED] insulin glargine  15 Units Subcutaneous QHS   Assessment: Asked to assist with Cefepime therapy for this 79 year-old male with pyelonephritis, status post ureteral stent placement on 08/13/12 due to hydronephrosis, and chronic kidney disease.  Prior positive urine cultures: 11/29/10 - coag (-) Staph 10/28/11 - Pseudomonas 12/28/11 - Enterobacter cloacae  4/1 >> Ceftriaxone 1gm x 1 4/2 >> Cefepime >>  Tmax: 98.9 WBCs: 21.6 Renal: SCr 2.48, CrCl 27.80ml/min  4/1 blood x 2: NGTD 4/1 urine: >100K pseudomonas  Goals of Therapy:   Eradication of infection  Dose appropriate for renal function  Plan:   Change cefepime to 2gm IV q24h for pseudomonas in urine and borderline CrCl (previous Pseudomonas in June '13 susc to cefepime)  Follow final results of blood and urine cultures.  Juliette Alcide, PharmD, BCPS.   Pager: [phone] 08/21/2012,1:32 PM

## 2012-08-21 NOTE — Progress Notes (Signed)
Patient ID: Jeffery Gibson, male   DOB: Mar 11, 1929, 79 y.o.   MRN: [id number]    Subjective: Pt better today.  He felt really good this morning and feels a little worse this afternoon but overall better than yesterday.  Still with some nausea.  Objective: Vital signs in last 24 hours: Temp:  [97.6 F (36.4 C)-98.9 F (37.2 C)] 98.6 F (37 C) (04/03 1350) Pulse Rate:  [63-79] 63 (04/03 1350) Resp:  [18-24] 20 (04/03 1350) BP: (114-140)/(48-62) 114/50 mmHg (04/03 1350) SpO2:  [95 %-97 %] 95 % (04/03 1350)  Intake/Output from previous day: 04/02 0701 - 04/03 0700 In: 1533.3 [P.O.:480; I.V.:1053.3] Out: 1350 [Urine:1350] Intake/Output this shift: Total I/O In: 550 [P.O.:300; IV Piggyback:250] Out: 475 [Urine:475]  Physical Exam:  General: Alert and oriented CV: RRR Abdomen: Soft, ND, No CVAT  Lab Results:  Recent Labs  08/19/12 1655 08/20/12 0444  HGB 11.1* 10.1*  HCT 32.4* 29.7*   BMET  Recent Labs  08/19/12 1655 08/20/12 0444  NA 130* 127*  K 4.4 3.9  CL 93* 94*  CO2 24 22  GLUCOSE 259* 238*  BUN 38* 38*  CREATININE 2.41* 2.48*  CALCIUM 9.1 8.7     Studies/Results: No results found. Urine culture: Pseudomonas sensitive to Cipro.  Assessment/Plan: - Pt now on Cipro and if tolerating po intake and his WBC is decreased tomorrow, he can probably be discharged on oral Cipro for a 10 day course with outpatient follow up.  - Will consider further treatment of his castrate resistant metastatic prostate cancer once he has recovered from his acute illness.   LOS: 2 days   Brookelle Pellicane,LES 08/21/2012, 6:05 PM

## 2012-08-21 NOTE — Progress Notes (Signed)
[facility] PROGRESS NOTE  Jeffery Gibson:[id number] DOB: [birth year] DOA: 08/19/2012 PCP: Alysia Penna, MD  Brief narrative: Jeffery Gibson is an 79 y.o. male with a PMHx of stage III chronic kidney disease CAD, metastatic prostate cancer, hydronephrosis status post ureteral stents 08/13/12, colon CA and HTN who presented to the hospital on 08/19/2012 with fever and shaking chills.   Assessment/Plan: Principal Problem:  Sepsis secondary to urinary tract infection, complicated / Pseudomonas UTI  -UTIs complicated given indwelling ureteral stents.  -Blood and urine cultures sent. Urine cultures growing Pseudomonas, antibiotics changed to Cipro based on sensitivity data. -Discussed case with Dr. Laverle Patter (urology) who does not recommend stent exchange at this time.  Active Problems:  Hyponatremia -Discontinue IV fluids and monitor. May be related to hyperglycemia. PROSTATE CANCER  -Continue Proscar, Flomax and mirabegron.  HYPERLIPIDEMIA  -Continue statin.  Diabetes mellitus  -Continue  moderate scale SSI, increase Lantus to 20 units.  -CBGs 171-252.   HTN (hypertension)  -Continue Norvasc and metoprolol. Hold Lasix.  Paroxysmal a-fib  -Continue aspirin.  -Continue amiodarone and metoprolol.  Chronic systolic congestive heart failure  -Well compensated clinically. EF 45-50% 11/12/2011.  Stage III chronic kidney disease  -Creatinine is actually better than baseline values.  Normocytic anemia / anemia associated with chronic renal failure  -On Aranesp as an outpatient. Hemoglobin stable.  Pulmonary nodule, right middle lobe  -Patient aware of this.  Code Status: Full.  Family Communication: Cirilo Canner [phone], cell [phone]. Wife and son updated at bedside. Disposition Plan: Home when stable.  Medical Consultants:  Dr. Crecencio Mc, Urology.  Other Consultants:  None.  Anti-infectives:  Rocephin 08/19/2012---> 08/20/2012  Cefepime 08/20/2012--->  08/21/2012  Cipro 08/21/2012--->  HPI/Subjective: Jeffery Gibson felt better this morning and then began to have some chills later in the day. No other complaints.  Objective: Filed Vitals:   08/20/12 0830 08/20/12 1410 08/20/12 2107 08/21/12 0440  BP:  156/65 115/52 140/62  Pulse:  100 68 79  Temp: 98.1 F (36.7 C) 98.1 F (36.7 C) 97.6 F (36.4 C) 98.1 F (36.7 C)  TempSrc: Oral Oral Oral Oral  Resp:  20 24 20   Height:      Weight:      SpO2:  99% 97% 97%    Intake/Output Summary (Last 24 hours) at 08/21/12 0840 Last data filed at 08/21/12 0441  Gross per 24 hour  Intake 1533.33 ml  Output   1350 ml  Net 183.33 ml    Exam: Gen:  NAD Cardiovascular:  RRR, No M/R/G Respiratory:  Lungs CTAB Gastrointestinal:  Abdomen soft, NT/ND, + BS Extremities:  Trace edema.  Data Reviewed: Basic Metabolic Panel:  Recent Labs Lab 08/19/12 1655 08/20/12 0444  NA 130* 127*  K 4.4 3.9  CL 93* 94*  CO2 24 22  GLUCOSE 259* 238*  BUN 38* 38*  CREATININE 2.41* 2.48*  CALCIUM 9.1 8.7   GFR Estimated Creatinine Clearance: 27.8 ml/min (by C-G formula based on Cr of 2.48). Liver Function Tests:  Recent Labs Lab 08/19/12 1655  AST 12  ALT 9  ALKPHOS 95  BILITOT 0.6  PROT 7.2  ALBUMIN 3.3*   CBC:  Recent Labs Lab 08/19/12 1655 08/20/12 0444  WBC 20.1* 21.6*  NEUTROABS 18.5*  --   HGB 11.1* 10.1*  HCT 32.4* 29.7*  MCV 95.3 94.9  PLT 216 192   BNP (last 3 results)  Recent Labs  11/07/11 1340  PROBNP 1968.0*   CBG:  Recent  Labs Lab 08/20/12 0749 08/20/12 1137 08/20/12 1651 08/20/12 2205 08/21/12 0751  GLUCAP 252* 171* 139* 184* 139*   Microbiology Recent Results (from the past 240 hour(s))  CULTURE, BLOOD (ROUTINE X 2)     Status: None   Collection Time    08/19/12  4:31 PM      Result Value Range Status   Specimen Description BLOOD RIGHT ARM   Final   Special Requests BOTTLES DRAWN AEROBIC AND ANAEROBIC 3CC   Final   Culture  Setup Time  08/19/2012 20:36   Final   Culture     Final   Value:        BLOOD CULTURE RECEIVED NO GROWTH TO DATE CULTURE WILL BE HELD FOR 5 DAYS BEFORE ISSUING A FINAL NEGATIVE REPORT   Report Status PENDING   Incomplete  URINE CULTURE     Status: None   Collection Time    08/19/12  4:40 PM      Result Value Range Status   Specimen Description URINE, CATHETERIZED   Final   Special Requests Normal   Final   Culture  Setup Time 08/19/2012 21:18   Final   Colony Count >=100,000 COLONIES/ML   Final   Culture PSEUDOMONAS AERUGINOSA   Final   Report Status PENDING   Incomplete  CULTURE, BLOOD (ROUTINE X 2)     Status: None   Collection Time    08/19/12  5:00 PM      Result Value Range Status   Specimen Description BLOOD RIGHT ARM   Final   Special Requests BOTTLES DRAWN AEROBIC AND ANAEROBIC 3CC   Final   Culture  Setup Time 08/19/2012 20:37   Final   Culture     Final   Value:        BLOOD CULTURE RECEIVED NO GROWTH TO DATE CULTURE WILL BE HELD FOR 5 DAYS BEFORE ISSUING A FINAL NEGATIVE REPORT   Report Status PENDING   Incomplete     Procedures and Diagnostic Studies:  Ct Abdomen Pelvis Wo Contrast 08/19/2012 IMPRESSION: Bilateral ureteral stent placement has resulted and resolved bilateral hydronephrosis.  4 mm right middle lobe pulmonary nodule. If the patient is at high risk for bronchogenic carcinoma, follow-up chest CT at 1 year is recommended.  If the patient is at low risk, no follow-up is needed.  This recommendation follows the consensus statement: Guidelines for Management of Small Pulmonary Nodules Detected on CT Scans:  A Statement from the Fleischner Society as published in Radiology 2005; 237:395-400.   Original Report Authenticated By: Jolaine Click, M.D.     Dg Chest 2 View 08/19/2012 IMPRESSION:  1.  Cardiac enlargement. 2.  Low lung volumes with asymmetric elevation of the right hemidiaphragm.   Original Report Authenticated By: Signa Kell, M.D.     Scheduled Meds: . ALIGN  1  capsule Oral Daily  . amiodarone  100 mg Oral q morning - 10a  . amLODipine  5 mg Oral q morning - 10a  . aspirin EC  81 mg Oral QHS  . atorvastatin  10 mg Oral q1800  . ceFEPime (MAXIPIME) IV  1 g Intravenous Q24H  . cholecalciferol  2,000 Units Oral Daily  . finasteride  5 mg Oral QHS  . insulin aspart  0-15 Units Subcutaneous TID WC  . insulin aspart  0-5 Units Subcutaneous QHS  . insulin glargine  20 Units Subcutaneous QHS  . metoprolol  50 mg Oral BID  . mirabegron ER  25 mg Oral QHS  .  pantoprazole  40 mg Oral Daily  . sodium chloride  3 mL Intravenous Q12H  . tamsulosin  0.4 mg Oral QHS  . ursodiol  300 mg Oral BID  . vitamin C  500 mg Oral Daily   Continuous Infusions:    Time spent: 25 minutes.   LOS: 2 days   Nik Gorrell  [facility] Pager [phone].  If 8PM-8AM, please contact night-coverage at [email or web address], password [facility] 08/21/2012, 8:40 AM

## 2012-08-22 DIAGNOSIS — N039 Chronic nephritic syndrome with unspecified morphologic changes: Secondary | ICD-10-CM

## 2012-08-22 DIAGNOSIS — I219 Acute myocardial infarction, unspecified: Secondary | ICD-10-CM

## 2012-08-22 DIAGNOSIS — N189 Chronic kidney disease, unspecified: Secondary | ICD-10-CM

## 2012-08-22 DIAGNOSIS — D126 Benign neoplasm of colon, unspecified: Secondary | ICD-10-CM

## 2012-08-22 LAB — BASIC METABOLIC PANEL
BUN: 59 mg/dL — ABNORMAL HIGH (ref 6–23)
Calcium: 8.5 mg/dL (ref 8.4–10.5)
GFR calc non Af Amer: 18 mL/min — ABNORMAL LOW (ref >90)
Glucose, Bld: 130 mg/dL — ABNORMAL HIGH (ref 70–99)

## 2012-08-22 LAB — GLUCOSE, CAPILLARY

## 2012-08-22 LAB — CBC
HCT: 29.1 % — ABNORMAL LOW (ref 39.0–52.0)
Hemoglobin: 9.9 g/dL — ABNORMAL LOW (ref 13.0–17.0)
MCH: 31.8 pg (ref 26.0–34.0)
MCHC: 34 g/dL (ref 30.0–36.0)

## 2012-08-22 MED ORDER — CIPROFLOXACIN HCL 500 MG PO TABS: 500.0000 mg | ORAL_TABLET | Freq: Two times a day (BID) | ORAL | Status: DC

## 2012-08-22 MED ORDER — CIPROFLOXACIN IN D5W 400 MG/200ML IV SOLN: 400.0000 mg | INTRAVENOUS | Status: DC

## 2012-08-22 NOTE — Discharge Summary (Signed)
Physician Discharge Summary  Jeffery [facility] ZOX:[id number] DOB: 12/21/1928 DOA: 08/19/2012  PCP: Jeffery Penna, MD  Admit date: 08/19/2012 Discharge date: 08/22/2012  Recommendations for Outpatient Follow-up:  1. Follow up with PCP in 1-2 weeks post discharge 2.  follow up with urology per scheduled appointment  Discharge Diagnoses:  Principal Problem:   Sepsis secondary to urinary tract infection, complicated Active Problems:   PROSTATE CANCER   HYPERLIPIDEMIA   Anemia associated with chronic renal failure   Diabetes mellitus   HTN (hypertension)   Paroxysmal a-fib   UTI (lower urinary tract infection)   Chronic systolic congestive heart failure   Stage III chronic kidney disease   Normocytic anemia   Pulmonary nodule, right middle lobe   Hyponatremia  Discharge Condition: medically stable for discharge home today  Diet recommendation: as tolerated  History of present illness:  79 y.o. male with a PMHx of stage III chronic kidney disease CAD, metastatic prostate cancer, hydronephrosis status post ureteral stents 08/13/12, colon CA and HTN who presented to [facility] on 08/19/2012 with fever and shaking chills.   Assessment/Plan:   Principal Problem:  Sepsis secondary to urinary tract infection, complicated / Pseudomonas UTI   UTIs complicated given indwelling ureteral stents.   Urine cultures growing Pseudomonas, antibiotics changed to Cipro based on sensitivity data.   WBC count is WNL so we will continue cipro for 10 more days on discharge  Blood cultures show no growth to date.  Discussed case with Dr. Laverle [facility] (urology) who does not recommend stent exchange at this time.  Active Problems:  Hyponatremia   Sodium stable in range 127-128  Likely related to hyperglycemia  PROSTATE CANCER   Continue Proscar, Flomax and mirabegron.  HYPERLIPIDEMIA   Continue statin.  Diabetes mellitus   Continue home meds  HTN (hypertension)   Continue Norvasc and  metoprolol. Hold Lasix for 1 week on discharge, then recheck kidney function and see with PCP if lasix can be restarted  Paroxysmal a-fib   Continue aspirin.   Continue amiodarone and metoprolol.  Chronic systolic congestive heart failure   Well compensated clinically. EF 45-50% 11/12/2011.  Stage III chronic kidney disease   Creatinine is actually better than baseline values.  Normocytic anemia / anemia associated with chronic renal failure   On Aranesp as an outpatient. Hemoglobin stable.  Pulmonary nodule, right middle lobe   Patient aware of this.   Code Status: Full.  Family Communication: Jeffery [facility] [phone], cell [phone]. Wife and son updated at bedside.  Disposition Plan: Home today    Medical Consultants:  Dr. Crecencio [facility], Urology. Other Consultants:  None. Anti-infectives:  Rocephin 08/19/2012---> 08/20/2012  Cefepime 08/20/2012---> 08/21/2012  Cipro 08/21/2012---> for 10 days on discharge   Discharge Exam: Filed Vitals:   08/22/12 0900  BP: 122/48  Pulse: 68  Temp:   Resp:    Filed Vitals:   08/21/12 2202 08/22/12 0545 08/22/12 0720 08/22/12 0900  BP: 116/52 126/56  122/48  Pulse: 66 63  68  Temp: 98.2 F (36.8 C) 97.8 F (36.6 C)    TempSrc: Oral Oral    Resp: 20 20    Height:      Weight:   112.6 kg (248 lb 3.8 oz)   SpO2: 96% 95%      General: Pt is alert, follows commands appropriately, not in acute distress Cardiovascular: Regular rate and rhythm, S1/S2 +, no murmurs, no rubs, no gallops Respiratory: Clear to auscultation bilaterally, no wheezing, no crackles, no  rhonchi Abdominal: Soft, non tender, non distended, bowel sounds +, no guarding Extremities: no edema, no cyanosis, pulses palpable bilaterally DP and PT Neuro: Grossly nonfocal  Discharge Instructions  Discharge Orders   Future Appointments Provider Department Dept Phone   09/09/2012 11:00 AM Jeffery Clement, MD [facility]) [phone]    09/11/2012 9:00 AM [facility] [phone]   09/11/2012 9:30 AM Jeffery December, MD [facility] [phone]   09/11/2012 10:30 AM [facility] [phone]   10/10/2012 8:30 AM [facility] [phone]   10/10/2012 9:00 AM [facility] [phone]   Future Orders Complete By Expires     Call MD for:  difficulty breathing, headache or visual disturbances  As directed     Call MD for:  persistant dizziness or light-headedness  As directed     Call MD for:  persistant nausea and vomiting  As directed     Call MD for:  severe uncontrolled pain  As directed     Diet - low sodium heart healthy  As directed     Increase activity slowly  As directed         Medication List    TAKE these medications       acetaminophen 500 MG tablet  Commonly known as:  TYLENOL  Take 500 mg by mouth every 6 (six) hours as needed for pain or fever.     ALIGN 4 MG Caps  Take 1 capsule by mouth daily.     amiodarone 200 MG tablet  Commonly known as:  PACERONE  Take 100 mg by mouth every morning.     amLODipine 5 MG tablet  Commonly known as:  NORVASC  Take 5 mg by mouth every morning.     ARANESP IJ  Inject 1 Syringe as directed See admin instructions. Every 4 weeks as needed for anemia if hemoglobin is below 11     aspirin EC 81 MG tablet  Take 81 mg by mouth every evening.     ciprofloxacin 500 MG tablet  Commonly known as:  CIPRO  Take 1 tablet (500 mg total) by mouth 2 (two) times daily.     finasteride 5 MG tablet  Commonly known as:  PROSCAR  Take 5 mg by mouth every evening.     furosemide 40 MG tablet  Commonly known as:  LASIX  Take 40 mg by mouth every morning.     HYDROcodone-acetaminophen 5-325 MG per tablet  Commonly known as:  NORCO/VICODIN  Take 1  tablet by mouth every 6 (six) hours as needed for pain.     LANTUS SOLOSTAR 100 UNIT/ML injection  Generic drug:  insulin glargine  Inject 15 Units into the skin at bedtime.     metoprolol 50 MG tablet  Commonly known as:  LOPRESSOR  Take 50 mg by mouth 2 (two) times daily.     MYRBETRIQ 25 MG Tb24  Generic drug:  mirabegron ER  Take 25 mg by mouth every evening.     omeprazole 20 MG capsule  Commonly known as:  PRILOSEC  Take 20 mg by mouth every evening.     rosuvastatin 10 MG tablet  Commonly known as:  CRESTOR  Take 5 mg by mouth every evening.     tamsulosin 0.4 MG Caps  Commonly known as:  FLOMAX  Take 0.4 mg by mouth daily after supper.     ursodiol 300 MG capsule  Commonly known as:  ACTIGALL  Take 300 mg by mouth 2 (two) times daily.     VICKS VAPOR INHALER IN  Inhale 1 puff into the lungs daily as needed (for dry air).     vitamin C 500 MG tablet  Commonly known as:  ASCORBIC ACID  Take 500 mg by mouth daily.     Vitamin D 2000 UNITS tablet  Take 2,000 Units by mouth daily at 12 noon.           Follow-up Information   Follow up with Jeffery Penna, MD In 1 week.   Contact information:   [address] [place] [address] [phone]        The results of significant diagnostics from this hospitalization (including imaging, microbiology, ancillary and laboratory) are listed below for reference.    Significant Diagnostic Studies: Ct Abdomen Pelvis Wo Contrast  08/19/2012  *RADIOLOGY REPORT*  Clinical Data: Fever and nausea  CT ABDOMEN AND PELVIS WITHOUT CONTRAST  Technique:  Multidetector CT imaging of the abdomen and pelvis was performed following the standard protocol without intravenous contrast.  Comparison: 07/17/2012  Findings: 4 mm irregular right middle lobe pulmonary nodule on image one series 5.  Cholelithiasis.  Unenhanced liver, spleen, pancreas, adrenal glands are within normal limits.  Bilateral ureteral stents have been placed from  the renal pelvises to the bladder.  Previously noted hydronephrosis has resolved. Stable renal hypodensities.  Normal appendix.  Bladder is decompressed. Bladder wall thickening is unchanged.  Brachia therapy pellets in the prostate.  Moderate stool burden in the sigmoid colon.  Diverticulosis without evidence of acute diverticulitis.  No free fluid.  No acute bony deformity.  IMPRESSION: Bilateral ureteral stent placement has resulted and resolved bilateral hydronephrosis.  4 mm right middle lobe pulmonary nodule. If the patient is at high risk for bronchogenic carcinoma, follow-up chest CT at 1 year is recommended.  If the patient is at low risk, no follow-up is needed.  This recommendation follows the consensus statement: Guidelines for Management of Small Pulmonary Nodules Detected on CT Scans:  A Statement from the Fleischner Society as published in Radiology 2005; 237:395-400.   Original Report Authenticated By: Jolaine Click, M.D.    Dg Chest 2 View  08/19/2012  *RADIOLOGY REPORT*  Clinical Data: Shortness of breath.  Weakness and abdominal pain.  CHEST - 2 VIEW  Comparison: 08/19/2012  Findings: The heart size is enlarged.  The patient is status post median sternotomy and CABG procedure.  There is a right chest wall pacer device with lead in the right atrial appendage and right ventricle.  Lung volumes are low and there is asymmetric elevation of the right hemidiaphragm.  No pleural effusion or edema noted. No airspace consolidation.  IMPRESSION:  1.  Cardiac enlargement. 2.  Low lung volumes with asymmetric elevation of the right hemidiaphragm.   Original Report Authenticated By: Signa Kell, M.D.    Ir Perc Nephrostomy Left  08/07/2012  *RADIOLOGY REPORT*  BILATERAL PERCUTANEOUS NEPHROSTOMY TUBE PLACEMENT UTILIZING BOTH ULTRASOUND AND FLUOROSCOPIC GUIDANCE  Date: 08/07/2012  Clinical History: 79 year old male with a history of prostate cancer and resultant bilateral hydronephrosis.  The patient  underwent an unsuccessful cystoscopy and retrograde stent placement and now presents to interventional radiology for staged bilateral double-J ureteral stent placement.  Today, we will proceed with bilateral percutaneous nephrostomy access.  Procedures Performed: 1. Ultrasound-guided puncture of  a left sided posterior inferior calix 2.  Placement of a percutaneous nephrostomy tube in the left renal collecting system under fluoroscopic guidance 3.  Ultrasound-guided puncture of a right-sided posterior inferior calix 4.  Placement of a percutaneous nephrostomy tube in the right renal collecting system under fluoroscopic guidance  Interventional Radiologist:  Sterling Big, MD  Sedation: Moderate (conscious) sedation was used.  Two mg Versed, 100 mcg Fentanyl were administered intravenously.  The patient's vital signs were monitored continuously by radiology nursing throughout the procedure.  Sedation Time: 130 minutes  Fluoroscopy time: 2.8 minutes  Contrast volume: 15 ml Omnipaque-300 administered into the renal collecting system  Antibiotic Prophylaxis:  400 mg of Ciprofloxacin administered intravenously within 1 hour of skin incision.  PROCEDURE/FINDINGS:   Informed consent was obtained from the patient following explanation of the procedure, risks, benefits and alternatives. The patient understands, agrees and consents for the procedure. All questions were addressed. A time out was performed.  Maximal barrier sterile technique utilized including caps, mask, sterile gowns, sterile gloves, large sterile drape, hand hygiene, and betadine skin prep.  The left flank was interrogated with ultrasound.  The kidney is hydronephrotic and moderately well visualized.  The kidney is fairly deep, at least 10 cm below the skin surface.  Local anesthesia was attained by infiltration of 1% lidocaine.  Using direct ultrasound guidance, a 22 gauge Accustick needle was carefully advanced and used to puncture and inferior,  posterior calix. Free return of urine confirm needle placement.  A gentle hand injection of contrast material opacifies the renal collecting system and also confirmed placement within the peripheral inferior calix.  A wire was advanced into the proximal ureter and the needle was exchanged for the Accustick sheath.  A Bentson wire was then advanced into the distal ureter and the tract serially dilated to 10-French.  A 10 French Cook all-purpose drainage catheter was then advanced into the renal pelvis under fluoroscopy.  The locking loop was well formed within the renal pelvis and confirmed by gentle hand injection of contrast material.  The tube was flushed and secured to the skin with O Prolene suture and an adhesive fixation device.  Attention was then turned to the right flank. The left kidney was successfully identified.  Similar to the right, the left kidney is very lateral in position and quite deep, nearly 10 cm below the skin surface.  Additionally, the left kidney has an extrarenal pelvis. Local anesthesia was attained by infiltration of 1% lidocaine.  Under direct sonographic guidance, a 22 gauge Accustick needle was carefully advanced into a posterior, lower pole calix. Free return of urine confirmed placement of the needle within the renal collecting system.  A gentle hand injection of contrast material opacifies the renal collecting system.  A wire was advanced in the renal pelvis and the needle was exchanged for the Accustick sheath.  A Benson wire was then advanced over the proximal ureter and the tract serially dilated to 10-French.  A Cook 10.2 [place] multipurpose drainage catheter was then advanced into the renal pelvis under fluoroscopic guidance.  The locking Cope loop was formed and positioned within the renal pelvis confirmed by gentle hand injection of contrast material under fluoroscopy.  The tube was flushed and secured to the skin with O Prolene suture and an adhesive fixation device.  The  patient tolerated procedure well, there is no immediate complication.  Both tubes were then connected to gravity bag drainage.  IMPRESSION:  Successful placement of bilateral 10-French percutaneous  nephrostomy tubes.  The patient will return in 1 week for internalization with double J ureteral stents.  Signed,  Sterling Big, MD Vascular & Interventional Radiologist [facility]   Original Report Authenticated By: Malachy Moan, M.D.    Ir Perc Nephrostomy Right  08/07/2012  *RADIOLOGY REPORT*  BILATERAL PERCUTANEOUS NEPHROSTOMY TUBE PLACEMENT UTILIZING BOTH ULTRASOUND AND FLUOROSCOPIC GUIDANCE  Date: 08/07/2012  Clinical History: 79 year old male with a history of prostate cancer and resultant bilateral hydronephrosis.  The patient underwent an unsuccessful cystoscopy and retrograde stent placement and now presents to interventional radiology for staged bilateral double-J ureteral stent placement.  Today, we will proceed with bilateral percutaneous nephrostomy access.  Procedures Performed: 1. Ultrasound-guided puncture of a left sided posterior inferior calix 2.  Placement of a percutaneous nephrostomy tube in the left renal collecting system under fluoroscopic guidance 3.  Ultrasound-guided puncture of a right-sided posterior inferior calix 4.  Placement of a percutaneous nephrostomy tube in the right renal collecting system under fluoroscopic guidance  Interventional Radiologist:  Sterling Big, MD  Sedation: Moderate (conscious) sedation was used.  Two mg Versed, 100 mcg Fentanyl were administered intravenously.  The patient's vital signs were monitored continuously by radiology nursing throughout the procedure.  Sedation Time: 130 minutes  Fluoroscopy time: 2.8 minutes  Contrast volume: 15 ml Omnipaque-300 administered into the renal collecting system  Antibiotic Prophylaxis:  400 mg of Ciprofloxacin administered intravenously within 1 hour of skin incision.  PROCEDURE/FINDINGS:    Informed consent was obtained from the patient following explanation of the procedure, risks, benefits and alternatives. The patient understands, agrees and consents for the procedure. All questions were addressed. A time out was performed.  Maximal barrier sterile technique utilized including caps, mask, sterile gowns, sterile gloves, large sterile drape, hand hygiene, and betadine skin prep.  The left flank was interrogated with ultrasound.  The kidney is hydronephrotic and moderately well visualized.  The kidney is fairly deep, at least 10 cm below the skin surface.  Local anesthesia was attained by infiltration of 1% lidocaine.  Using direct ultrasound guidance, a 22 gauge Accustick needle was carefully advanced and used to puncture and inferior, posterior calix. Free return of urine confirm needle placement.  A gentle hand injection of contrast material opacifies the renal collecting system and also confirmed placement within the peripheral inferior calix.  A wire was advanced into the proximal ureter and the needle was exchanged for the Accustick sheath.  A Bentson wire was then advanced into the distal ureter and the tract serially dilated to 10-French.  A 10 French Cook all-purpose drainage catheter was then advanced into the renal pelvis under fluoroscopy.  The locking loop was well formed within the renal pelvis and confirmed by gentle hand injection of contrast material.  The tube was flushed and secured to the skin with O Prolene suture and an adhesive fixation device.  Attention was then turned to the right flank. The left kidney was successfully identified.  Similar to the right, the left kidney is very lateral in position and quite deep, nearly 10 cm below the skin surface.  Additionally, the left kidney has an extrarenal pelvis. Local anesthesia was attained by infiltration of 1% lidocaine.  Under direct sonographic guidance, a 22 gauge Accustick needle was carefully advanced into a posterior, lower  pole calix. Free return of urine confirmed placement of the needle within the renal collecting system.  A gentle hand injection of contrast material opacifies the renal collecting system.  A wire was  advanced in the renal pelvis and the needle was exchanged for the Accustick sheath.  A Benson wire was then advanced over the proximal ureter and the tract serially dilated to 10-French.  A Cook 10.2 [place] multipurpose drainage catheter was then advanced into the renal pelvis under fluoroscopic guidance.  The locking Cope loop was formed and positioned within the renal pelvis confirmed by gentle hand injection of contrast material under fluoroscopy.  The tube was flushed and secured to the skin with O Prolene suture and an adhesive fixation device.  The patient tolerated procedure well, there is no immediate complication.  Both tubes were then connected to gravity bag drainage.  IMPRESSION:  Successful placement of bilateral 10-French percutaneous nephrostomy tubes.  The patient will return in 1 week for internalization with double J ureteral stents.  Signed,  Sterling Big, MD Vascular & Interventional Radiologist [facility]   Original Report Authenticated By: Malachy Moan, M.D.    Ir US Guide Bx Asp/drain  08/07/2012  *RADIOLOGY REPORT*  BILATERAL PERCUTANEOUS NEPHROSTOMY TUBE PLACEMENT UTILIZING BOTH ULTRASOUND AND FLUOROSCOPIC GUIDANCE  Date: 08/07/2012  Clinical History: 79 year old male with a history of prostate cancer and resultant bilateral hydronephrosis.  The patient underwent an unsuccessful cystoscopy and retrograde stent placement and now presents to interventional radiology for staged bilateral double-J ureteral stent placement.  Today, we will proceed with bilateral percutaneous nephrostomy access.  Procedures Performed: 1. Ultrasound-guided puncture of a left sided posterior inferior calix 2.  Placement of a percutaneous nephrostomy tube in the left renal collecting system under  fluoroscopic guidance 3.  Ultrasound-guided puncture of a right-sided posterior inferior calix 4.  Placement of a percutaneous nephrostomy tube in the right renal collecting system under fluoroscopic guidance  Interventional Radiologist:  Sterling Big, MD  Sedation: Moderate (conscious) sedation was used.  Two mg Versed, 100 mcg Fentanyl were administered intravenously.  The patient's vital signs were monitored continuously by radiology nursing throughout the procedure.  Sedation Time: 130 minutes  Fluoroscopy time: 2.8 minutes  Contrast volume: 15 ml Omnipaque-300 administered into the renal collecting system  Antibiotic Prophylaxis:  400 mg of Ciprofloxacin administered intravenously within 1 hour of skin incision.  PROCEDURE/FINDINGS:   Informed consent was obtained from the patient following explanation of the procedure, risks, benefits and alternatives. The patient understands, agrees and consents for the procedure. All questions were addressed. A time out was performed.  Maximal barrier sterile technique utilized including caps, mask, sterile gowns, sterile gloves, large sterile drape, hand hygiene, and betadine skin prep.  The left flank was interrogated with ultrasound.  The kidney is hydronephrotic and moderately well visualized.  The kidney is fairly deep, at least 10 cm below the skin surface.  Local anesthesia was attained by infiltration of 1% lidocaine.  Using direct ultrasound guidance, a 22 gauge Accustick needle was carefully advanced and used to puncture and inferior, posterior calix. Free return of urine confirm needle placement.  A gentle hand injection of contrast material opacifies the renal collecting system and also confirmed placement within the peripheral inferior calix.  A wire was advanced into the proximal ureter and the needle was exchanged for the Accustick sheath.  A Bentson wire was then advanced into the distal ureter and the tract serially dilated to 10-French.  A 10 French  Cook all-purpose drainage catheter was then advanced into the renal pelvis under fluoroscopy.  The locking loop was well formed within the renal pelvis and confirmed by gentle hand injection of contrast material.  The  tube was flushed and secured to the skin with O Prolene suture and an adhesive fixation device.  Attention was then turned to the right flank. The left kidney was successfully identified.  Similar to the right, the left kidney is very lateral in position and quite deep, nearly 10 cm below the skin surface.  Additionally, the left kidney has an extrarenal pelvis. Local anesthesia was attained by infiltration of 1% lidocaine.  Under direct sonographic guidance, a 22 gauge Accustick needle was carefully advanced into a posterior, lower pole calix. Free return of urine confirmed placement of the needle within the renal collecting system.  A gentle hand injection of contrast material opacifies the renal collecting system.  A wire was advanced in the renal pelvis and the needle was exchanged for the Accustick sheath.  A Benson wire was then advanced over the proximal ureter and the tract serially dilated to 10-French.  A Cook 10.2 [place] multipurpose drainage catheter was then advanced into the renal pelvis under fluoroscopic guidance.  The locking Cope loop was formed and positioned within the renal pelvis confirmed by gentle hand injection of contrast material under fluoroscopy.  The tube was flushed and secured to the skin with O Prolene suture and an adhesive fixation device.  The patient tolerated procedure well, there is no immediate complication.  Both tubes were then connected to gravity bag drainage.  IMPRESSION:  Successful placement of bilateral 10-French percutaneous nephrostomy tubes.  The patient will return in 1 week for internalization with double J ureteral stents.  Signed,  Sterling Big, MD Vascular & Interventional Radiologist [facility]   Original Report Authenticated By:  Malachy Moan, M.D.    Ir US Guide Bx Asp/drain  08/07/2012  *RADIOLOGY REPORT*  BILATERAL PERCUTANEOUS NEPHROSTOMY TUBE PLACEMENT UTILIZING BOTH ULTRASOUND AND FLUOROSCOPIC GUIDANCE  Date: 08/07/2012  Clinical History: 79 year old male with a history of prostate cancer and resultant bilateral hydronephrosis.  The patient underwent an unsuccessful cystoscopy and retrograde stent placement and now presents to interventional radiology for staged bilateral double-J ureteral stent placement.  Today, we will proceed with bilateral percutaneous nephrostomy access.  Procedures Performed: 1. Ultrasound-guided puncture of a left sided posterior inferior calix 2.  Placement of a percutaneous nephrostomy tube in the left renal collecting system under fluoroscopic guidance 3.  Ultrasound-guided puncture of a right-sided posterior inferior calix 4.  Placement of a percutaneous nephrostomy tube in the right renal collecting system under fluoroscopic guidance  Interventional Radiologist:  Sterling Big, MD  Sedation: Moderate (conscious) sedation was used.  Two mg Versed, 100 mcg Fentanyl were administered intravenously.  The patient's vital signs were monitored continuously by radiology nursing throughout the procedure.  Sedation Time: 130 minutes  Fluoroscopy time: 2.8 minutes  Contrast volume: 15 ml Omnipaque-300 administered into the renal collecting system  Antibiotic Prophylaxis:  400 mg of Ciprofloxacin administered intravenously within 1 hour of skin incision.  PROCEDURE/FINDINGS:   Informed consent was obtained from the patient following explanation of the procedure, risks, benefits and alternatives. The patient understands, agrees and consents for the procedure. All questions were addressed. A time out was performed.  Maximal barrier sterile technique utilized including caps, mask, sterile gowns, sterile gloves, large sterile drape, hand hygiene, and betadine skin prep.  The left flank was interrogated with  ultrasound.  The kidney is hydronephrotic and moderately well visualized.  The kidney is fairly deep, at least 10 cm below the skin surface.  Local anesthesia was attained by infiltration of 1% lidocaine.  Using  direct ultrasound guidance, a 22 gauge Accustick needle was carefully advanced and used to puncture and inferior, posterior calix. Free return of urine confirm needle placement.  A gentle hand injection of contrast material opacifies the renal collecting system and also confirmed placement within the peripheral inferior calix.  A wire was advanced into the proximal ureter and the needle was exchanged for the Accustick sheath.  A Bentson wire was then advanced into the distal ureter and the tract serially dilated to 10-French.  A 10 French Cook all-purpose drainage catheter was then advanced into the renal pelvis under fluoroscopy.  The locking loop was well formed within the renal pelvis and confirmed by gentle hand injection of contrast material.  The tube was flushed and secured to the skin with O Prolene suture and an adhesive fixation device.  Attention was then turned to the right flank. The left kidney was successfully identified.  Similar to the right, the left kidney is very lateral in position and quite deep, nearly 10 cm below the skin surface.  Additionally, the left kidney has an extrarenal pelvis. Local anesthesia was attained by infiltration of 1% lidocaine.  Under direct sonographic guidance, a 22 gauge Accustick needle was carefully advanced into a posterior, lower pole calix. Free return of urine confirmed placement of the needle within the renal collecting system.  A gentle hand injection of contrast material opacifies the renal collecting system.  A wire was advanced in the renal pelvis and the needle was exchanged for the Accustick sheath.  A Benson wire was then advanced over the proximal ureter and the tract serially dilated to 10-French.  A Cook 10.2 [place] multipurpose drainage  catheter was then advanced into the renal pelvis under fluoroscopic guidance.  The locking Cope loop was formed and positioned within the renal pelvis confirmed by gentle hand injection of contrast material under fluoroscopy.  The tube was flushed and secured to the skin with O Prolene suture and an adhesive fixation device.  The patient tolerated procedure well, there is no immediate complication.  Both tubes were then connected to gravity bag drainage.  IMPRESSION:  Successful placement of bilateral 10-French percutaneous nephrostomy tubes.  The patient will return in 1 week for internalization with double J ureteral stents.  Signed,  Sterling Big, MD Vascular & Interventional Radiologist [facility]   Original Report Authenticated By: Malachy Moan, M.D.    [facility] [place] Abts Cath Perc Left  08/13/2012  *RADIOLOGY REPORT*  Clinical Data: Prostate cancer, ureteral obstruction, nephrostomy catheters placed 08/07/2012  REMOVAL OF BILATERAL NEPHROSTOMY TUBES INSERTION OF THE BILATERAL INTERNAL URETERAL STENTS  Date:  08/13/2012 09:30:00  Radiologist:  Judie Petit. Ruel Favors, M.D.  Medications:  2 mg Versed, 100 mcg Fentanyl,400 mg Cipro administered within 1 hour of the procedure  Guidance:  Fluoroscopic  Fluoroscopy time:  7.9 minutes  Sedation time:  20 minutes  Contrast volume:  25 ml Omnipaque-300  Complications:  No immediate  PROCEDURE/FINDINGS:  Informed consent was obtained from the patient following explanation of the procedure, risks, benefits and alternatives. The patient understands, agrees and consents for the procedure. All questions were addressed.  A time out was performed.  Maximal barrier sterile technique utilized including caps, mask, sterile gowns, sterile gloves, large sterile drape, hand hygiene, and betadine  Left nephrostomy removal and ureteral stent insertion:  Under sterile conditions, the existing nephrostomy catheter was injected with contrast to confirm position.  The catheter  was cut and removed over a Bentson guide wire.  Kumpe  catheter and a Bentson guide wire were utilized to access the ureter and advance the catheter into the bladder.  Contrast injection confirms position in the bladder.  Amplatz guide wire inserted.  Measurements obtained for the appropriate length.  9-French sheath inserted.  Bentson guide wire was advanced as a safety guide wire.  Over Amplatz guide wire, a 26 cm 8-French ureteral stent was advanced with the distal loop formed in the bladder and the proximal loop in the renal pelvis.  Contrast injection confirms antegrade flow.  External access removed.  Right nephrostomy removal and ureteral stent insertion:  In a similar fashion, the right nephrostomy catheter was injected with contrast to confirm position.  Catheter was cut and removed over a guide wire.  Kumpe catheter and a glidewire were utilized to access the ureter.  Catheter and guide wire access were manipulated into the bladder.  Contrast injection confirms position in the bladder. Measurements obtained for the appropriate length.  Amplatz guide wire inserted.  9-French peel-away sheath advanced.  Safety guide wire inserted as well.  Over the Amplatz guide wire, a 24 cm 8- French ureteral stent was advanced with the retention loop formed in the bladder distally and in the renal pelvis proximally.  Images obtained for documentation.  Contrast injection confirms good antegrade flow.  No immediate complication.  The patient tolerated the procedure well.  IMPRESSION: Successful removal of the external nephrostomies  Successful insertion of bilateral internal ureteral stents as described.   Original Report Authenticated By: Judie Petit. Miles Costain, M.D.    Ir [place] Abts Cath Perc Right  08/13/2012  *RADIOLOGY REPORT*  Clinical Data: Prostate cancer, ureteral obstruction, nephrostomy catheters placed 08/07/2012  REMOVAL OF BILATERAL NEPHROSTOMY TUBES INSERTION OF THE BILATERAL INTERNAL URETERAL STENTS  Date:  08/13/2012  09:30:00  Radiologist:  Judie Petit. Ruel Favors, M.D.  Medications:  2 mg Versed, 100 mcg Fentanyl,400 mg Cipro administered within 1 hour of the procedure  Guidance:  Fluoroscopic  Fluoroscopy time:  7.9 minutes  Sedation time:  20 minutes  Contrast volume:  25 ml Omnipaque-300  Complications:  No immediate  PROCEDURE/FINDINGS:  Informed consent was obtained from the patient following explanation of the procedure, risks, benefits and alternatives. The patient understands, agrees and consents for the procedure. All questions were addressed.  A time out was performed.  Maximal barrier sterile technique utilized including caps, mask, sterile gowns, sterile gloves, large sterile drape, hand hygiene, and betadine  Left nephrostomy removal and ureteral stent insertion:  Under sterile conditions, the existing nephrostomy catheter was injected with contrast to confirm position.  The catheter was cut and removed over a Bentson guide wire.  Kumpe catheter and a Bentson guide wire were utilized to access the ureter and advance the catheter into the bladder.  Contrast injection confirms position in the bladder.  Amplatz guide wire inserted.  Measurements obtained for the appropriate length.  9-French sheath inserted.  Bentson guide wire was advanced as a safety guide wire.  Over Amplatz guide wire, a 26 cm 8-French ureteral stent was advanced with the distal loop formed in the bladder and the proximal loop in the renal pelvis.  Contrast injection confirms antegrade flow.  External access removed.  Right nephrostomy removal and ureteral stent insertion:  In a similar fashion, the right nephrostomy catheter was injected with contrast to confirm position.  Catheter was cut and removed over a guide wire.  Kumpe catheter and a glidewire were utilized to access the ureter.  Catheter and guide wire access were manipulated  into the bladder.  Contrast injection confirms position in the bladder. Measurements obtained for the appropriate length.   Amplatz guide wire inserted.  9-French peel-away sheath advanced.  Safety guide wire inserted as well.  Over the Amplatz guide wire, a 24 cm 8- French ureteral stent was advanced with the retention loop formed in the bladder distally and in the renal pelvis proximally.  Images obtained for documentation.  Contrast injection confirms good antegrade flow.  No immediate complication.  The patient tolerated the procedure well.  IMPRESSION: Successful removal of the external nephrostomies  Successful insertion of bilateral internal ureteral stents as described.   Original Report Authenticated By: Judie Petit. Miles Costain, M.D.     Microbiology: Recent Results (from the past 240 hour(s))  CULTURE, BLOOD (ROUTINE X 2)     Status: None   Collection Time    08/19/12  4:31 PM      Result Value Range Status   Specimen Description BLOOD RIGHT ARM   Final   Special Requests BOTTLES DRAWN AEROBIC AND ANAEROBIC 3CC   Final   Culture  Setup Time 08/19/2012 20:36   Final   Culture     Final   Value:        BLOOD CULTURE RECEIVED NO GROWTH TO DATE CULTURE WILL BE HELD FOR 5 DAYS BEFORE ISSUING A FINAL NEGATIVE REPORT   Report Status PENDING   Incomplete  URINE CULTURE     Status: None   Collection Time    08/19/12  4:40 PM      Result Value Range Status   Specimen Description URINE, CATHETERIZED   Final   Special Requests Normal   Final   Culture  Setup Time 08/19/2012 21:18   Final   Colony Count >=100,000 COLONIES/ML   Final   Culture PSEUDOMONAS AERUGINOSA   Final   Report Status 08/21/2012 FINAL   Final   Organism ID, Bacteria PSEUDOMONAS AERUGINOSA   Final  CULTURE, BLOOD (ROUTINE X 2)     Status: None   Collection Time    08/19/12  5:00 PM      Result Value Range Status   Specimen Description BLOOD RIGHT ARM   Final   Special Requests BOTTLES DRAWN AEROBIC AND ANAEROBIC 3CC   Final   Culture  Setup Time 08/19/2012 20:37   Final   Culture     Final   Value:        BLOOD CULTURE RECEIVED NO GROWTH TO DATE  CULTURE WILL BE HELD FOR 5 DAYS BEFORE ISSUING A FINAL NEGATIVE REPORT   Report Status PENDING   Incomplete     Labs: Basic Metabolic Panel:  Recent Labs Lab 08/19/12 1655 08/20/12 0444 08/22/12 0443  NA 130* 127* 128*  K 4.4 3.9 3.6  CL 93* 94* 95*  CO2 24 22 22   GLUCOSE 259* 238* 130*  BUN 38* 38* 59*  CREATININE 2.41* 2.48* 3.08*  CALCIUM 9.1 8.7 8.5   Liver Function Tests:  Recent Labs Lab 08/19/12 1655  AST 12  ALT 9  ALKPHOS 95  BILITOT 0.6  PROT 7.2  ALBUMIN 3.3*   No results found for this basename: LIPASE, AMYLASE,  in the last 168 hours No results found for this basename: AMMONIA,  in the last 168 hours CBC:  Recent Labs Lab 08/19/12 1655 08/20/12 0444 08/22/12 0443  WBC 20.1* 21.6* 9.4  NEUTROABS 18.5*  --   --   HGB 11.1* 10.1* 9.9*  HCT 32.4* 29.7* 29.1*  MCV 95.3 94.9  93.6  PLT 216 192 170   Cardiac Enzymes: No results found for this basename: CKTOTAL, CKMB, CKMBINDEX, TROPONINI,  in the last 168 hours BNP: BNP (last 3 results)  Recent Labs  11/07/11 1340  PROBNP 1968.0*   CBG:  Recent Labs Lab 08/21/12 0751 08/21/12 1156 08/21/12 1646 08/21/12 2159 08/22/12 0751  GLUCAP 139* 152* 192* 158* 130*    Time coordinating discharge: Over 30 minutes  Signed:  Manson Passey, MD  [facility]  08/22/2012, 9:51 AM  Pager #: [phone]

## 2012-08-25 LAB — CULTURE, BLOOD (ROUTINE X 2): Culture: NO GROWTH

## 2012-08-28 ENCOUNTER — Telehealth: Payer: Self-pay | Admitting: Cardiology

## 2012-08-28 NOTE — Telephone Encounter (Signed)
Advised wife. Did encourage her to call urologist if he continues to feel bad

## 2012-08-28 NOTE — Telephone Encounter (Signed)
New problem   Pt's wife want to know if pt can stop taking one of his medications. Please call pt's wife.

## 2012-08-28 NOTE — Telephone Encounter (Signed)
Resume Lasix 40mg daily

## 2012-08-28 NOTE — Telephone Encounter (Signed)
Was in the hospital from 08/19/12 until 08/22/12. On d/c is said to not resume lasix for 1 week and to check with PCP (on vacation until 4/17)before doing so. She is not sure what to do with Lasix.  Per wife patient is not feeling well, denies fever. Will forward to  Dr. Patty Sermons for review

## 2012-08-28 NOTE — Telephone Encounter (Signed)
Left message to call back  

## 2012-09-09 ENCOUNTER — Encounter: Payer: Self-pay | Admitting: Cardiology

## 2012-09-09 ENCOUNTER — Ambulatory Visit (INDEPENDENT_AMBULATORY_CARE_PROVIDER_SITE_OTHER): Payer: Medicare Other | Admitting: Cardiology

## 2012-09-09 VITALS — BP 134/72 | HR 78 | Ht 68.0 in | Wt 238.0 lb

## 2012-09-09 DIAGNOSIS — Z8679 Personal history of other diseases of the circulatory system: Secondary | ICD-10-CM

## 2012-09-09 DIAGNOSIS — I480 Paroxysmal atrial fibrillation: Secondary | ICD-10-CM

## 2012-09-09 DIAGNOSIS — I4891 Unspecified atrial fibrillation: Secondary | ICD-10-CM

## 2012-09-09 DIAGNOSIS — N390 Urinary tract infection, site not specified: Secondary | ICD-10-CM

## 2012-09-09 NOTE — Patient Instructions (Addendum)
Your physician recommends that you continue on your current medications as directed. Please refer to the Current Medication list given to you today.  Your physician recommends that you schedule a follow-up appointment in: 3 month ov 

## 2012-09-09 NOTE — Assessment & Plan Note (Signed)
Patient has a past history of paroxysmal atrial fibrillation.  He has a dual-chamber pacemaker for tachybradycardia syndrome.  EKG today shows that he is in an AV sequential paced rhythm.  He is no longer on Coumadin because of problems with excessive bleeding.  He is on a baby aspirin daily.

## 2012-09-09 NOTE — Progress Notes (Signed)
[name] Date of Birth:  [birth year] [facility] [address] [place], [place]  [address] [phone]         Fax   [phone]  History of Present Illness: This pleasant 79 year old gentleman is seen for a scheduled followup office visit. He has a complex past medical history. He has known ischemic heart disease. He is diabetic. He has a history of tachybradycardia syndrome and paroxysmal atrial fibrillation. He has a pacemaker. He has a malignant neoplasm of the prostate with metastases and recently underwent orchiectomy at [facility] on January 18, 2012. However his PSA is still rising slowly and most recently he was hospitalized and on a nuclear bone scan was found to have a metastatic prostate cancer to his left sixth rib. The patient denies any recent chest pain or angina. His weight has been stable. He has not been having any significant peripheral edema.   Current Outpatient Prescriptions  Medication Sig Dispense Refill  . acetaminophen (TYLENOL) 500 MG tablet Take 500 mg by mouth every 6 (six) hours as needed for pain or fever.      Marland Kitchen amiodarone (PACERONE) 200 MG tablet Take 100 mg by mouth every morning.       Marland Kitchen amLODipine (NORVASC) 5 MG tablet Take 5 mg by mouth every morning.      . Aromatic Inhalants (VICKS VAPOR INHALER IN) Inhale 1 puff into the lungs daily as needed (for dry air).      Marland Kitchen aspirin EC 81 MG tablet Take 81 mg by mouth every evening.       . Cholecalciferol (VITAMIN D) 2000 UNITS tablet Take 2,000 Units by mouth daily at 12 noon.       . Darbepoetin Alfa-Albumin (ARANESP IJ) Inject 1 Syringe as directed See admin instructions. Every 4 weeks as needed for anemia if hemoglobin is below 11      . finasteride (PROSCAR) 5 MG tablet Take 5 mg by mouth every evening.       . furosemide (LASIX) 40 MG tablet Take 40 mg by mouth every morning.       Marland Kitchen HYDROcodone-acetaminophen (NORCO/VICODIN) 5-325 MG per tablet Take 1 tablet by mouth  every 6 (six) hours as needed for pain.      Marland Kitchen insulin glargine (LANTUS SOLOSTAR) 100 UNIT/ML injection Inject 15 Units into the skin at bedtime.      . metoprolol (LOPRESSOR) 50 MG tablet Take 50 mg by mouth 2 (two) times daily.      . mirabegron ER (MYRBETRIQ) 25 MG TB24 Take 25 mg by mouth every evening.       Marland Kitchen omeprazole (PRILOSEC) 20 MG capsule Take 20 mg by mouth every evening.       . Probiotic Product (ALIGN) 4 MG CAPS Take 1 capsule by mouth daily.      . rosuvastatin (CRESTOR) 10 MG tablet Take 5 mg by mouth every evening.       . Tamsulosin HCl (FLOMAX) 0.4 MG CAPS Take 0.4 mg by mouth daily after supper.       . ursodiol (ACTIGALL) 300 MG capsule Take 300 mg by mouth 2 (two) times daily.       . vitamin C (ASCORBIC ACID) 500 MG tablet Take 500 mg by mouth daily.       No current facility-administered medications for this visit.    Allergies  Allergen Reactions  . Adhesive (Tape) Rash    Rash and itching. USE PAPER TAPE ONLY  .  Latex Hives  . Lovenox (Enoxaparin Sodium)     Gi bleed  . Lupron (Leuprolide Acetate)     Kidney failure  . Warfarin And Related     Gi bleed    Patient Active Problem List  Diagnosis  . PROSTATE CANCER  . ADENOMATOUS COLONIC POLYP  . HYPERLIPIDEMIA  . MYOCARDIAL INFARCTION  . CAD  . BRADYCARDIA-TACHYCARDIA SYNDROME  . Anemia associated with chronic renal failure  . Diabetes mellitus  . HTN (hypertension)  . Paroxysmal a-fib  . UTI (lower urinary tract infection)  . Pacemaker  . Chronic systolic congestive heart failure  . Sepsis secondary to urinary tract infection, complicated  . Stage III chronic kidney disease  . Normocytic anemia  . Pulmonary nodule, right middle lobe  . Hyponatremia    History  Smoking status  . Former Smoker -- 10 years  . Types: Cigarettes  . Quit date: 05/22/1963  Smokeless tobacco  . Never Used    History  Alcohol Use No    Family History  Problem Relation Age of Onset  . Heart failure  Father 76  . Gallbladder disease Father     Review of Systems: Constitutional: no fever chills diaphoresis or fatigue or change in weight.  Head and neck: no hearing loss, no epistaxis, no photophobia or visual disturbance. Respiratory: No cough, shortness of breath or wheezing. Cardiovascular: No chest pain peripheral edema, palpitations. Gastrointestinal: No abdominal distention, no abdominal pain, no change in bowel habits hematochezia or melena. Genitourinary: No dysuria, no frequency, no urgency, no nocturia. Musculoskeletal:No arthralgias, no back pain, no gait disturbance or myalgias. Neurological: No dizziness, no headaches, no numbness, no seizures, no syncope, no weakness, no tremors. Hematologic: No lymphadenopathy, no easy bruising. Psychiatric: No confusion, no hallucinations, no sleep disturbance.    Physical Exam: Filed Vitals:   09/09/12 1113  BP: 134/72  Pulse: 78   the general is a slightly pale elderly gentleman who is moderately obese.The head and neck exam reveals pupils equal and reactive.  Extraocular movements are full.  There is no scleral icterus.  The mouth and pharynx are normal.  The neck is supple.  The carotids reveal no bruits.  The jugular venous pressure is normal.  The  thyroid is not enlarged.  There is no lymphadenopathy.  The chest is clear to percussion and auscultation.  There are no rales or rhonchi.  Expansion of the chest is symmetrical.  The precordium is quiet.  The first heart sound is normal.  The second heart sound is physiologically split.  There is no murmur gallop rub or click.  There is no abnormal lift or heave.  The abdomen is soft and nontender.  The bowel sounds are normal.  The liver and spleen are not enlarged.  There are no abdominal masses.  There are no abdominal bruits.  Extremities reveal good pedal pulses.  There is no phlebitis or edema.  There is no cyanosis or clubbing.  Strength is normal and symmetrical in all extremities.   There is no lateralizing weakness.  There are no sensory deficits.  The skin is warm and dry.  There is no rash.  EKG shows AV sequential electronic pacemaker  Assessment / Plan: Continue on same medication.  Recheck in 3 months for followup office visit.

## 2012-09-09 NOTE — Assessment & Plan Note (Signed)
The patient was recently hospitalized with a Pseudomonas urinary tract infection with urosepsis.  He recently finished a course of days of Cipro.  He still has episodes of feeling chilly and states that he is passing milky urine.  During his last hospitalization he had to have bilateral ureteral stents for hydronephrosis. He is being followed closely by Dr. Laverle Patter, his urologist.  He is being considered for the Provenge therapy through [facility] for his metastatic prostate cancer

## 2012-09-10 ENCOUNTER — Ambulatory Visit: Payer: Medicare Other

## 2012-09-10 ENCOUNTER — Other Ambulatory Visit: Payer: Medicare Other | Admitting: Lab

## 2012-09-11 ENCOUNTER — Ambulatory Visit (HOSPITAL_BASED_OUTPATIENT_CLINIC_OR_DEPARTMENT_OTHER): Payer: Medicare Other | Admitting: Oncology

## 2012-09-11 ENCOUNTER — Telehealth: Payer: Self-pay | Admitting: *Deleted

## 2012-09-11 ENCOUNTER — Ambulatory Visit: Payer: Medicare Other

## 2012-09-11 ENCOUNTER — Ambulatory Visit (HOSPITAL_BASED_OUTPATIENT_CLINIC_OR_DEPARTMENT_OTHER): Payer: Medicare Other

## 2012-09-11 ENCOUNTER — Other Ambulatory Visit (HOSPITAL_BASED_OUTPATIENT_CLINIC_OR_DEPARTMENT_OTHER): Payer: Medicare Other | Admitting: Lab

## 2012-09-11 ENCOUNTER — Other Ambulatory Visit: Payer: Self-pay | Admitting: Lab

## 2012-09-11 VITALS — BP 131/70 | HR 78 | Temp 98.3°F | Resp 20 | Ht 68.0 in | Wt 238.0 lb

## 2012-09-11 DIAGNOSIS — Z85038 Personal history of other malignant neoplasm of large intestine: Secondary | ICD-10-CM

## 2012-09-11 DIAGNOSIS — C61 Malignant neoplasm of prostate: Secondary | ICD-10-CM

## 2012-09-11 DIAGNOSIS — C7951 Secondary malignant neoplasm of bone: Secondary | ICD-10-CM

## 2012-09-11 DIAGNOSIS — D638 Anemia in other chronic diseases classified elsewhere: Secondary | ICD-10-CM

## 2012-09-11 DIAGNOSIS — N189 Chronic kidney disease, unspecified: Secondary | ICD-10-CM

## 2012-09-11 LAB — CBC WITH DIFFERENTIAL/PLATELET
Eosinophils Absolute: 0.2 10*3/uL (ref 0.0–0.5)
HCT: 31 % — ABNORMAL LOW (ref 38.4–49.9)
LYMPH%: 9.4 % — ABNORMAL LOW (ref 14.0–49.0)
MONO#: 0.9 10*3/uL (ref 0.1–0.9)
NEUT#: 6.1 10*3/uL (ref 1.5–6.5)
NEUT%: 77.1 % — ABNORMAL HIGH (ref 39.0–75.0)
Platelets: 145 10*3/uL (ref 140–400)
WBC: 7.9 10*3/uL (ref 4.0–10.3)

## 2012-09-11 MED ORDER — DARBEPOETIN ALFA-POLYSORBATE 300 MCG/0.6ML IJ SOLN
300.0000 ug | Freq: Once | INTRAMUSCULAR | Status: AC
  Administered 2012-09-11: 300 ug via SUBCUTANEOUS
  Filled 2012-09-11: qty 0.6

## 2012-09-11 NOTE — Telephone Encounter (Signed)
appts made and printed...td 

## 2012-09-11 NOTE — Patient Instructions (Addendum)
Proceed with Dr. Vevelyn Royals recommendation for provenge  Continue aranesp injections every 28 days  I will see you back in 6months

## 2012-09-11 NOTE — Progress Notes (Signed)
OFFICE PROGRESS NOTE  CC: Dr. Almetta Lovely, Lorin Picket, MD [address] [address]  DIAGNOSIS: 79 year old gentleman with  #1 stage II adenocarcinoma of the colon originally diagnosed in July 2005 on observation.  #2 prostate cancer status post brachii therapy is currently receiving Lupron every 6 month is followed by Dr. Luane School at [facility]. Patient now with elevated PSA to 12. He also is noted to have a sixth rib bone metastasis consistent with recurrence of prostate cancer. He was seen by Dr. Laverle Patter and a full workup is in progress. Dairy even thinking about doing Provenge  #3 Anemia secondary to renal insufficiency and chronic disease on Aranesp injections 300 mg every 3 weeks to keep her hemoglobin at or above 11 g  CURRENT THERAPY: Aranesp 300 mg every 3 week,   INTERVAL HISTORY: Jeffery Gibson 79 y.o. male returns for followup visit. He is doing well today.  He had a bone scan performed that reveals a bony metastasis in the sixth left rib. I have reviewed the bone scan. He is also status post bilateral ureteral stents. Patient is fatigued tired he has not had any hematuria or hematochezia no recurrence of bleeding. He is now being seen by Dr. Marina Gravel for renal failure. MEDICAL HISTORY: Past Medical History  Diagnosis Date  . Ischemic heart disease 05/06/06    post CARG 05/06/06  . Obese     exogenous  . Renal insufficiency   . Anemia of chronic disease     aranesp injections  . Dyslipidemia   . Coronary artery disease   . Anemia associated with chronic renal failure 04/05/2011  . Cancer     prostate/on Lupron  . Adenocarcinoma of colon 11/2003    stage 2(T3,N0,M0)  . Diabetes mellitus   . Hypertension   . Anginal pain   . Myocardial infarction 1986  . Pacemaker   . Cataracts, bilateral   . Seizures   . Arthritis   . Hemorrhoids   . Sleep apnea   . GERD (gastroesophageal reflux disease)     hx of  .  Hematoma     hx of  . Atrial flutter     afib and atrial flutter (permanent)  . Tachycardia-bradycardia 1997    dual-chamber/for tachybradycardia syndrome    ALLERGIES:  is allergic to adhesive; latex; lovenox; lupron; and warfarin and related.  MEDICATIONS:  Current Outpatient Prescriptions  Medication Sig Dispense Refill  . acetaminophen (TYLENOL) 500 MG tablet Take 500 mg by mouth every 6 (six) hours as needed for pain or fever.      Marland Kitchen amiodarone (PACERONE) 200 MG tablet Take 100 mg by mouth every morning.       Marland Kitchen amLODipine (NORVASC) 5 MG tablet Take 5 mg by mouth every morning.      Marland Kitchen aspirin EC 81 MG tablet Take 81 mg by mouth every evening.       . Cholecalciferol (VITAMIN D) 2000 UNITS tablet Take 2,000 Units by mouth daily at 12 noon.       . Darbepoetin Alfa-Albumin (ARANESP IJ) Inject 1 Syringe as directed See admin instructions. Every 4 weeks as needed for anemia if hemoglobin is below 11      . finasteride (PROSCAR) 5 MG tablet Take 5 mg by mouth every evening.       . furosemide (LASIX) 40 MG tablet Take 40 mg by mouth every morning.       . insulin glargine (  LANTUS SOLOSTAR) 100 UNIT/ML injection Inject 15 Units into the skin at bedtime.      . metoprolol (LOPRESSOR) 50 MG tablet Take 50 mg by mouth 2 (two) times daily.      . mirabegron ER (MYRBETRIQ) 25 MG TB24 Take 25 mg by mouth every evening.       Marland Kitchen omeprazole (PRILOSEC) 20 MG capsule Take 20 mg by mouth every evening.       . Probiotic Product (ALIGN) 4 MG CAPS Take 1 capsule by mouth daily.      . rosuvastatin (CRESTOR) 10 MG tablet Take 5 mg by mouth every evening.       . Tamsulosin HCl (FLOMAX) 0.4 MG CAPS Take 0.4 mg by mouth daily after supper.       . ursodiol (ACTIGALL) 300 MG capsule Take 300 mg by mouth 2 (two) times daily.       . vitamin C (ASCORBIC ACID) 500 MG tablet Take 500 mg by mouth daily.      . Aromatic Inhalants (VICKS VAPOR INHALER IN) Inhale 1 puff into the lungs daily as needed (for dry  air).      Marland Kitchen HYDROcodone-acetaminophen (NORCO/VICODIN) 5-325 MG per tablet Take 1 tablet by mouth every 6 (six) hours as needed for pain.       No current facility-administered medications for this visit.    SURGICAL HISTORY:  Past Surgical History  Procedure Laterality Date  . Laparotomy  12/04/2003    resection of rectosigmoid carcinoma/  . Radioactive seed implant  05/2005    transperianeal placement I-125 for prostate cancer/# of  seeds 55  . Cardiac catheterization  05/01/06    EF 40%/diffuse 3 vessel CAD/tight L antereior descending artery stenosis/diffuse disease proximal L anterior descending  . Pacemaker removal  1997  . Fracture surgery      left ankle  . Insert / replace / remove pacemaker  2006  . Colon surgery  2005  . Coronary artery bypass graft  05/06/06    x4 with L internal mammary artery to the L anterior descending coronary artery  . Laser surgery  2012    prostate  . Orchiectomy  august 2013  . Cystoscopy w/ ureteral stent placement Bilateral 08/04/2012    Procedure: CYSTOSCOPY WITH bilateral retrograde;  Surgeon: Crecencio Mc, MD;  Location: [facility];  Service: Urology;  Laterality: Bilateral;  attempted stent placement     REVIEW OF SYSTEMS:   General: fatigue (+), night sweats (-), fever (-), pain (-) Lymph: palpable nodes (-) HEENT: vision changes (-), mucositis (-), gum bleeding (-), epistaxis (-) Cardiovascular: chest pain (-), palpitations (-) Pulmonary: shortness of breath (-), dyspnea on exertion (-), cough (-), hemoptysis (-) GI:  Early satiety (-), melena (-), dysphagia (-), nausea/vomiting (-), diarrhea (-) GU: dysuria (-), hematuria (-), incontinence (-) Musculoskeletal: joint swelling (-), joint pain (-), back pain (-) Neuro: weakness (-), numbness (-), headache (-), confusion (-) Skin: Rash (-), lesions (-), dryness (-) Psych: depression (-), suicidal/homicidal ideation (-), feeling of hopelessness (-)   PHYSICAL EXAMINATION: BP 131/70   Pulse 78  Temp(Src) 98.3 F (36.8 C) (Oral)  Resp 20  Ht 5\' 8"  (1.727 m)  Wt 238 lb (107.956 kg)  BMI 36.2 kg/m2 General: Patient is a well appearing male in no acute distress HEENT: PERRLA, sclerae anicteric no conjunctival pallor, MMM Neck: supple, no palpable adenopathy Lungs: clear to auscultation bilaterally, no wheezes, rhonchi, or rales Cardiovascular: regular rate rhythm, S1, S2, no murmurs, rubs or  gallops Abdomen: Soft, non-tender, non-distended, normoactive bowel sounds, no HSM Extremities: warm and well perfused, no clubbing, cyanosis, or edema Skin: No rashes or lesions Neuro: Non-focal ECOG PERFORMANCE STATUS: 1 - Symptomatic but completely ambulatory  LABORATORY DATA: Lab Results  Component Value Date   WBC 7.9 09/11/2012   HGB 10.2* 09/11/2012   HCT 31.0* 09/11/2012   MCV 93.9 09/11/2012   PLT 145 09/11/2012      Chemistry      Component Value Date/Time   NA 128* 08/22/2012 0443   NA 138 07/10/2012 1115   NA 141 02/09/2010 1156   K 3.6 08/22/2012 0443   K 4.2 07/10/2012 1115   K 4.5 02/09/2010 1156   CL 95* 08/22/2012 0443   CL 104 07/10/2012 1115   CL 98 02/09/2010 1156   CO2 22 08/22/2012 0443   CO2 22 07/10/2012 1115   CO2 25 02/09/2010 1156   BUN 59* 08/22/2012 0443   BUN 41.4* 07/10/2012 1115   BUN 36* 02/09/2010 1156   CREATININE 3.08* 08/22/2012 0443   CREATININE 2.6* 07/10/2012 1115   CREATININE 2.88* 12/28/2011 1546      Component Value Date/Time   CALCIUM 8.5 08/22/2012 0443   CALCIUM 9.0 07/10/2012 1115   CALCIUM 8.6 02/09/2010 1156   ALKPHOS 95 08/19/2012 1655   ALKPHOS 92 07/10/2012 1115   ALKPHOS 77 02/09/2010 1156   AST 12 08/19/2012 1655   AST 9 07/10/2012 1115   AST 25 02/09/2010 1156   ALT 9 08/19/2012 1655   ALT 6 07/10/2012 1115   BILITOT 0.6 08/19/2012 1655   BILITOT 0.43 07/10/2012 1115   BILITOT 0.70 02/09/2010 1156       ASSESSMENT: 79 year old gentleman with history of stage II colon carcinoma he is in complete remission. He also developed prostate cancer  now with bone metastasis. He was seen by Dr. Laverle Patter who has recommended the possibility of doing Provenge. Patient was recently hospitalized for what sounds like bilateral hydronephrosis and stent placement.  PLAN:  #1 patient will proceed with Aranesp injection today.  #2 he will continue to be seen every month for CBC and Aranesp injections as needed.   #3 We will see him back in 6 months for an appointment.   All questions were answered. The patient knows to call the clinic with any problems, questions or concerns. We can certainly see the patient much sooner if necessary.  I spent 25 minutes counseling the patient face to face. The total time spent in the appointment was 30 minutes.  Drue Second, MD Medical/Oncology [facility] [phone] (beeper) [phone] (Office)  09/11/2012, 3:53 PM

## 2012-09-22 ENCOUNTER — Other Ambulatory Visit: Payer: Self-pay | Admitting: Oncology

## 2012-09-23 ENCOUNTER — Other Ambulatory Visit: Payer: Self-pay | Admitting: *Deleted

## 2012-09-23 MED ORDER — METOPROLOL TARTRATE 50 MG PO TABS: 50.0000 mg | ORAL_TABLET | Freq: Two times a day (BID) | ORAL | Status: DC

## 2012-09-26 ENCOUNTER — Telehealth: Payer: Self-pay | Admitting: Medical Oncology

## 2012-09-26 NOTE — Telephone Encounter (Signed)
Pt's wife LVMOM stating pt's urologist wants to treat pt with provenge for prostate ca and asking if this will be a problem or any interaction concerns since pt is receiving aranesp injections?  Mssg forwarded to NP/MD for review  Next sched appt 05/22 lab/inj  05/23 lab/inj 02/26/13 lab/MD

## 2012-09-29 NOTE — Telephone Encounter (Signed)
Let patient know that there should be no problems

## 2012-09-30 ENCOUNTER — Telehealth: Payer: Self-pay | Admitting: Emergency Medicine

## 2012-09-30 NOTE — Telephone Encounter (Signed)
Per Dr Welton Flakes, informed patient's spouse that there is no contraindication to receiving Aranesp and Provenge. Spouse verbalized understanding and instructed to call this office with any further concerns. Per patient's spouse Jeffery Gibson should be receiving the Provenge treatment this June at [facility].

## 2012-10-09 ENCOUNTER — Other Ambulatory Visit (HOSPITAL_BASED_OUTPATIENT_CLINIC_OR_DEPARTMENT_OTHER): Payer: Medicare Other | Admitting: Lab

## 2012-10-09 ENCOUNTER — Ambulatory Visit (HOSPITAL_BASED_OUTPATIENT_CLINIC_OR_DEPARTMENT_OTHER): Payer: Medicare Other

## 2012-10-09 VITALS — BP 125/58 | HR 86 | Temp 98.3°F

## 2012-10-09 DIAGNOSIS — D631 Anemia in chronic kidney disease: Secondary | ICD-10-CM

## 2012-10-09 DIAGNOSIS — N039 Chronic nephritic syndrome with unspecified morphologic changes: Secondary | ICD-10-CM

## 2012-10-09 DIAGNOSIS — N189 Chronic kidney disease, unspecified: Secondary | ICD-10-CM

## 2012-10-09 LAB — CBC WITH DIFFERENTIAL/PLATELET
Basophils Absolute: 0 10*3/uL (ref 0.0–0.1)
EOS%: 2.7 % (ref 0.0–7.0)
HCT: 30.2 % — ABNORMAL LOW (ref 38.4–49.9)
HGB: 10 g/dL — ABNORMAL LOW (ref 13.0–17.1)
MCH: 30.4 pg (ref 27.2–33.4)
MCV: 91.8 fL (ref 79.3–98.0)
MONO%: 8.8 % (ref 0.0–14.0)
NEUT%: 77.6 % — ABNORMAL HIGH (ref 39.0–75.0)
lymph#: 1 10*3/uL (ref 0.9–3.3)

## 2012-10-09 MED ORDER — DARBEPOETIN ALFA-POLYSORBATE 500 MCG/ML IJ SOLN
300.0000 ug | Freq: Once | INTRAMUSCULAR | Status: AC
  Administered 2012-10-09: 300 ug via SUBCUTANEOUS
  Filled 2012-10-09: qty 1

## 2012-10-10 ENCOUNTER — Ambulatory Visit: Payer: Medicare Other

## 2012-10-10 ENCOUNTER — Other Ambulatory Visit: Payer: Medicare Other | Admitting: Lab

## 2012-10-14 ENCOUNTER — Other Ambulatory Visit: Payer: Self-pay | Admitting: *Deleted

## 2012-10-14 MED ORDER — OMEPRAZOLE 20 MG PO CPDR: 20.0000 mg | DELAYED_RELEASE_CAPSULE | Freq: Every evening | ORAL | Status: DC

## 2012-10-15 ENCOUNTER — Encounter: Payer: Medicare Other | Admitting: Internal Medicine

## 2012-10-15 ENCOUNTER — Other Ambulatory Visit: Payer: Self-pay | Admitting: Urology

## 2012-10-15 DIAGNOSIS — N390 Urinary tract infection, site not specified: Secondary | ICD-10-CM

## 2012-10-16 ENCOUNTER — Ambulatory Visit (HOSPITAL_COMMUNITY)
Admission: RE | Admit: 2012-10-16 | Discharge: 2012-10-16 | Disposition: A | Discharge status: Home / Self Care | Payer: Medicare Other | Source: Ambulatory Visit | Attending: Urology | Admitting: Urology

## 2012-10-16 ENCOUNTER — Other Ambulatory Visit: Payer: Self-pay | Admitting: Urology

## 2012-10-16 DIAGNOSIS — N390 Urinary tract infection, site not specified: Secondary | ICD-10-CM | POA: Insufficient documentation

## 2012-10-16 DIAGNOSIS — Z950 Presence of cardiac pacemaker: Secondary | ICD-10-CM | POA: Insufficient documentation

## 2012-10-16 MED ORDER — LIDOCAINE HCL 1 % IJ SOLN
INTRAMUSCULAR | Status: AC
  Filled 2012-10-16: qty 20

## 2012-10-16 NOTE — Procedures (Signed)
Interventional Radiology Procedure Note  Procedure: Placement of right basilic vein single lumen PICC.  Tip at superior cavoatrial junction and ready for use. Complications: None Recommendations:  - Routine line care  Signed,  Sterling Big, MD Vascular & Interventional Radiologist [facility] Radiology

## 2012-10-22 ENCOUNTER — Encounter: Payer: Self-pay | Admitting: Internal Medicine

## 2012-10-22 ENCOUNTER — Ambulatory Visit (INDEPENDENT_AMBULATORY_CARE_PROVIDER_SITE_OTHER): Payer: Medicare Other | Admitting: Internal Medicine

## 2012-10-22 VITALS — BP 149/78 | HR 86 | Ht 68.0 in | Wt 240.4 lb

## 2012-10-22 DIAGNOSIS — I4891 Unspecified atrial fibrillation: Secondary | ICD-10-CM

## 2012-10-22 DIAGNOSIS — N390 Urinary tract infection, site not specified: Secondary | ICD-10-CM

## 2012-10-22 DIAGNOSIS — A419 Sepsis, unspecified organism: Secondary | ICD-10-CM

## 2012-10-22 DIAGNOSIS — I480 Paroxysmal atrial fibrillation: Secondary | ICD-10-CM

## 2012-10-22 DIAGNOSIS — I495 Sick sinus syndrome: Secondary | ICD-10-CM

## 2012-10-22 LAB — PACEMAKER DEVICE OBSERVATION
ATRIAL PACING PM: 90
RV LEAD IMPEDENCE PM: 322 Ohm
RV LEAD THRESHOLD: 1 V
VENTRICULAR PACING PM: 89

## 2012-10-22 NOTE — Progress Notes (Signed)
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  He has had a pacemaker for syncope and SSS 03/27/1996. He reached ERI and underwent pulse generator replacement (MDT) by Dr Reyes Ivan 03/02/05.  Since last being seen in our clinic, he has had difficulty with urinary infection.  He has prostate Ca and had stents placed for obstructive symptoms.  He subsequently developed pseudomonas UTI requiring IV antibiotics.  I have reviewed epic and do not see that he was ever bacteremic.  He is making slow but steady improvement.  Today, he denies symptoms of palpitations, chest pain, shortness of breath,  lower extremity edema, dizziness, presyncope, or syncope.  The patient is otherwise without complaint today.   Past Medical History  Diagnosis Date  . Ischemic heart disease 05/06/06    post CARG 05/06/06  . Obese     exogenous  . Renal insufficiency   . Anemia of chronic disease     aranesp injections  . Dyslipidemia   . Coronary artery disease   . Anemia associated with chronic renal failure 04/05/2011  . Cancer     prostate/on Lupron  . Adenocarcinoma of colon 11/2003    stage 2(T3,N0,M0)  . Diabetes mellitus   . Hypertension   . Anginal pain   . Myocardial infarction 1986  . Pacemaker   . Cataracts, bilateral   . Seizures   . Arthritis   . Hemorrhoids   . Sleep apnea   . GERD (gastroesophageal reflux disease)     hx of  . Hematoma     hx of  . Atrial flutter     afib and atrial flutter (permanent)  . Tachycardia-bradycardia 1997    dual-chamber/for tachybradycardia syndrome   Past Surgical History  Procedure Laterality Date  . Laparotomy  12/04/2003    resection of rectosigmoid carcinoma/  . Radioactive seed implant  05/2005    transperianeal placement I-125 for prostate cancer/# of  seeds 55  . Cardiac catheterization  05/01/06    EF 40%/diffuse 3 vessel CAD/tight L  antereior descending artery stenosis/diffuse disease proximal L anterior descending  . Pacemaker removal  1997  . Fracture surgery      left ankle  . Insert / replace / remove pacemaker  2006  . Colon surgery  2005  . Coronary artery bypass graft  05/06/06    x4 with L internal mammary artery to the L anterior descending coronary artery  . Laser surgery  2012    prostate  . Orchiectomy  august 2013  . Cystoscopy w/ ureteral stent placement Bilateral 08/04/2012    Procedure: CYSTOSCOPY WITH bilateral retrograde;  Surgeon: Crecencio Mc, MD;  Location: [facility];  Service: Urology;  Laterality: Bilateral;  attempted stent placement     Current Outpatient Prescriptions  Medication Sig Dispense Refill  . acetaminophen (TYLENOL) 500 MG tablet Take 500 mg by mouth every 6 (six) hours as needed for pain or fever.      Marland Kitchen amiodarone (PACERONE) 200 MG tablet Take 100 mg by mouth every morning.       Marland Kitchen amLODipine (NORVASC) 5 MG tablet Take 5 mg by mouth every morning.      . Aromatic Inhalants (VICKS VAPOR INHALER IN) Inhale 1 puff into the lungs daily as needed (for dry air).      Marland Kitchen aspirin EC 81 MG tablet Take 81 mg by mouth every evening.       Marland Kitchen  Cholecalciferol (VITAMIN D) 2000 UNITS tablet Take 2,000 Units by mouth daily at 12 noon.       . Darbepoetin Alfa-Albumin (ARANESP IJ) Inject 1 Syringe as directed See admin instructions. Every 4 weeks as needed for anemia if hemoglobin is below 11      . finasteride (PROSCAR) 5 MG tablet Take 5 mg by mouth every evening.       . furosemide (LASIX) 40 MG tablet Take 40 mg by mouth every morning.       Marland Kitchen HYDROcodone-acetaminophen (NORCO/VICODIN) 5-325 MG per tablet Take 1 tablet by mouth every 6 (six) hours as needed for pain.      Marland Kitchen insulin glargine (LANTUS SOLOSTAR) 100 UNIT/ML injection Inject 15 Units into the skin at bedtime.      . metoprolol (LOPRESSOR) 50 MG tablet Take 1 tablet (50 mg total) by mouth 2 (two) times daily.  30 tablet  5  . mirabegron  ER (MYRBETRIQ) 25 MG TB24 Take 25 mg by mouth every evening.       Marland Kitchen omeprazole (PRILOSEC) 20 MG capsule Take 1 capsule (20 mg total) by mouth every evening.  90 capsule  3  . Probiotic Product (ALIGN) 4 MG CAPS Take 1 capsule by mouth daily.      . rosuvastatin (CRESTOR) 10 MG tablet Take 5 mg by mouth every evening.       . Tamsulosin HCl (FLOMAX) 0.4 MG CAPS Take 0.4 mg by mouth daily after supper.       . ursodiol (ACTIGALL) 300 MG capsule Take 300 mg by mouth 2 (two) times daily.       . vitamin C (ASCORBIC ACID) 500 MG tablet Take 500 mg by mouth daily.       No current facility-administered medications for this visit.    Physical Exam: Filed Vitals:   10/22/12 1126  BP: 149/78  Pulse: 86  Height: 5\' 8"  (1.727 m)  Weight: 240 lb 6.4 oz (109.045 kg)    GEN- The patient is well appearing, alert and oriented x 3 today.   Head- normocephalic, atraumatic Eyes-  Sclera clear, conjunctiva pink Ears- hearing intact Oropharynx- clear Lungs- Clear to ausculation bilaterally, normal work of breathing Chest- R sided pacemaker pocket is well healed Heart- Regular rate and rhythm (paced) GI- soft, NT, ND, + BS Extremities- no clubbing, cyanosis, or edema R pic line is in place  Pacemaker interrogation- reviewed in detail today,  See PACEART report  Assessment and Plan:  1. Tachycardia/ bradycardia syndrome Normal pacemaker function See Pace Art report No changes today  2. afib Well controlled  No evidence of device related infection at this time.  The importance of compliance with antibiotics and close follow-up to avoid bacteremia was discussed with the patient today. He will continue carelink and I will see in 1 year

## 2012-10-22 NOTE — Patient Instructions (Addendum)
Your physician wants you to follow-up in: January 26, 2013 You will receive a reminder letter in the mail two months in advance. If you don't receive a letter, please call our office to schedule the follow-up appointment.  Your physician wants you to follow-up in: 1 year with Dr Johney Frame.  You will receive a reminder letter in the mail two months in advance. If you don't receive a letter, please call our office to schedule the follow-up appointment.

## 2012-10-27 ENCOUNTER — Other Ambulatory Visit: Payer: Self-pay | Admitting: Urology

## 2012-10-27 ENCOUNTER — Encounter (HOSPITAL_COMMUNITY): Payer: Self-pay | Admitting: Pharmacy Technician

## 2012-10-30 ENCOUNTER — Encounter (HOSPITAL_COMMUNITY): Payer: Self-pay

## 2012-10-30 ENCOUNTER — Encounter (HOSPITAL_COMMUNITY)
Admission: RE | Admit: 2012-10-30 | Discharge: 2012-10-30 | Disposition: A | Discharge status: Home / Self Care | Payer: Medicare Other | Source: Ambulatory Visit | Attending: Urology | Admitting: Urology

## 2012-10-30 HISTORY — PX: PERIPHERALLY INSERTED CENTRAL CATHETER INSERTION: SHX2221

## 2012-10-30 LAB — BASIC METABOLIC PANEL
CO2: 24 mEq/L (ref 19–32)
Chloride: 96 mEq/L (ref 96–112)
Creatinine, Ser: 2.41 mg/dL — ABNORMAL HIGH (ref 0.50–1.35)
GFR calc Af Amer: 27 mL/min — ABNORMAL LOW (ref >90)
Potassium: 4.4 mEq/L (ref 3.5–5.1)
Sodium: 133 mEq/L — ABNORMAL LOW (ref 135–145)

## 2012-10-30 LAB — CBC
HCT: 32.4 % — ABNORMAL LOW (ref 39.0–52.0)
Hemoglobin: 10.5 g/dL — ABNORMAL LOW (ref 13.0–17.0)
MCHC: 32.4 g/dL (ref 30.0–36.0)
RBC: 3.57 MIL/uL — ABNORMAL LOW (ref 4.22–5.81)

## 2012-10-30 LAB — SURGICAL PCR SCREEN: Staphylococcus aureus: NEGATIVE

## 2012-10-30 NOTE — Pre-Procedure Instructions (Addendum)
EKG 4'14/ CXR 4'14 -Epic. Pacemaker check report 10-22-12-Epic.(Pacemaker orders signed) will have [employer]  rep on site. Saunders Glance 10-30-12 1610 Spoke with Dr. 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 Rivers Hamrick,RN Dr. Laverle Patter made aware labs viewable in Epic.

## 2012-10-30 NOTE — Progress Notes (Signed)
10-30-12 1540 Labs viewable in [place].

## 2012-10-30 NOTE — Patient Instructions (Addendum)
20 Jeffery Gibson  10/30/2012   Your procedure is scheduled on:  6-16 -2014  Report to [facility] at   0830     AM.  Call this number if you have problems the morning of surgery: [phone]  Or Presurgical Testing [phone](Amorah Sebring)     Do not eat food:After Midnight.  .  Take these medicines the morning of surgery with A SIP OF WATER: Amiodarone. Amlodipine. Metoprolol. Take Lantus insulin 7 units(1/2 usual dose) at bedtime night before. Take no insulin Am of.     Do not wear jewelry, make-up or nail polish.  Do not wear lotions, powders, or perfumes. You may wear deodorant.  Do not shave 12 hours prior to first CHG shower(legs and under arms).(face and neck okay.)  Do not bring valuables to the hospital.  Contacts, dentures or bridgework,body piercing,  may not be worn into surgery.  Leave suitcase in the car. After surgery it may be brought to your room.  For patients admitted to the hospital, checkout time is 11:00 AM the day of discharge.   Patients discharged the day of surgery will not be allowed to drive home. Must have responsible person with you x 24 hours once discharged.  Name and phone number of your driver: Nikodem Leadbetter [phone] cell  Special Instructions: CHG(Chlorhedine 4%-"Hibiclens","Betasept","Aplicare") Shower Use Special Wash: see special instructions.(avoid face and genitals)   Please read over the following fact sheets that you were given: MRSA Information.   Failure to follow these instructions may result in Cancellation of your surgery.   Patient signature_______________________________________________________

## 2012-11-01 NOTE — H&P (Signed)
History of Present Illness  Mr. Rocks is an 79 year old with the following urologic history:  1) Prostate cancer: He has a history of prostate cancer dating back to 2006 as previously been seen in this office by Dr. Wanda Plump and Dr. Aldean Ast although has not been seen in this office since 2010. He was initially diagnosed with Gleason 7 adenocarcinoma of the prostate in November 2006. His PSA at that time was 5.6. He underwent definitive therapy with a radiation seed implantation by Dr. Boston Service and Dr. Chipper Herb in January 2007. He initially had a favorable result with a PSA declined to under 1.0. He subsequently established care with Dr. Aldean Ast after Dr. Wanda Plump left [place] and was noted to have an increase of his PSA in the spring of 2009. He did begin androgen deprivation therapy in the late summer of 2009 and eventually transferred his care to [facility] under the care of Dr. Luane School. His PSA was noted to be increasing in 2012 although rather slowly. He did have a bilateral simple orchiectomy performed in August of 2013 but had not undergone any further therapy aside from androgen deprivation prior to his initial consultation with me in February 2014. His PSA continued to rise despite his orchiectomy indicating CRPC.  He underwent a bone scan and CT scan in March 2014 which confirmed metastatic disease to the bone.  2) BPH/LUTS: He also has a history of BPH and lower urinary tract symptoms. He was taking finasteride, tamsulosin, and oxybutynin ER 10 mg when I first saw him in February 2014. He apparently had gross hematuria and by history was noted to have some difficulty emptying his bladder. He underwent a "laser" procedure presumably due to bleeding related to his radiation therapy of the prostate while being followed by [facility]. Since this treatment, his hematuria has stopped. Ever since then, he has been catheterizing twice daily there is no known history of stricture  disease. Currently, he continues to have some episodes of unconscious incontinence which typically occur when he is sleeping or early morning. He typically does wear depends undergarments and this does cause him some distress. He did have significant dry mouth with oxybutynin.  Current treatment: Myrbetriq 25 mg  Prior treatment: Oxybutynin (dry mouth)  3) Bilateral ureteral obstruction/CKD: He has a long-standing history of chronic kidney disease with a baseline creatinine of approximately 2.1. He has been followed by Dr. Marina Gravel since June 2013 when he developed acute renal failure. He was found to have hydronephrosis on renal ultrasound and nuclear medicine renal scan imaging confirmed probable obstruction.  His CT scan also suggested ureteral dilation down to the UVJ bilaterally.  I attempted to place ureteral stents cystoscopically in March 2014 but was unable due to his locally advanced prostate cancer.  He subsequently underwent bilateral nephrostomy tube placement with subsequent antegrade ureteral stent placement.  4) Testosterone deficiency/bone health: He does take vitamin D supplementation although currently is not on a calcium supplement. He has not undergone a bone density evaluation since starting androgen deprivation therapy in 2009. He does have fatigue as expected on androgen deprivation therapy. His hot flashes have been mild and are not particularly bothersome.  Interval history:  He follows up today for further evaluation of the above issues and for further evaluation of a recently diagnosed urinary tract infection.  He had developed symptoms including malaise, low-grade fever, and dysuria/urinary frequency.  He was seen recently and had a urine culture that was positive for Pseudomonas and resistant to  fluoroquinolones or other oral antibiotics.  He was therefore placed on IV cefepime and receive this via a PICC line.  Completed therapy on Wednesday.  He feels much improved now  and his urine has cleared.  He denies any fever.     Past Medical History Problems  1. History of  Acute Myocardial Infarction V12.59 2. History of  Arthritis V13.4 3. History of  Atrial Fibrillation 427.31 4. History of  Cardiac Failure 428.9 5. History of  Clostridium Difficile Colitis 008.45 6. History of  Colon Cancer V10.05 7. History of  Diabetes Mellitus 250.00 8. History of  Esophageal Reflux 530.81 9. History of  Heart Disease 429.9 10. History of  Hypertension 401.9  Surgical History Problems  1. History of  CABG (CABG) 2. History of  Colon Surgery 3. History of  Pacemaker Placement 4. History of  Surgery Prostate Transperineal Placement Of Needles  Current Meds 1. Adult Aspirin Low Strength 81 MG Oral Tablet Dispersible; Therapy: (Recorded:28Apr2008) to 2. [employer] Vitamin C TABS; Therapy: (Recorded:28Apr2008) to 3. Amiodarone HCl 200 MG Oral Tablet; Therapy: (Recorded:25Feb2014) to 4. AmLODIPine Besylate 5 MG Oral Tablet; Therapy: 17Dec2013 to 5. Cefepime HCl 2 GM Injection Solution Reconstituted; Therapy: 29May2014 to 6. Crestor 10 MG Oral Tablet; Therapy: (Recorded:25Feb2014) to 7. Finasteride 5 MG Oral Tablet; Therapy: 27Jan2014 to 8. Furosemide 40 MG Oral Tablet; Therapy: 07Oct2013 to 9. Hydrocodone-Acetaminophen 5-325 MG Oral Tablet; TAKE 1 TABLET EVERY 6 HOURS AS  NEEDED FOR PAIN; Therapy: 19Mar2014 to (Evaluate:24Mar2014); Last Rx:19Mar2014 10. Lantus SoloStar 100 UNIT/ML Subcutaneous Solution Pen-injector; Therapy: 31Dec2013 to 11. Metoprolol Tartrate 50 MG Oral Tablet; Therapy: (Recorded:28Apr2008) to 12. Multi-Day Vitamins TABS; Therapy: (Recorded:28Apr2008) to 13. Myrbetriq 50 MG Oral Tablet Extended Release 24 Hour; TAKE 1 TABLET Daily; Therapy:   03Apr2014 to (Evaluate:29Mar2015)  Requested for: 03Apr2014; Last Rx:03Apr2014 14. Omeprazole 20 MG Oral Capsule Delayed Release; Therapy: (Recorded:28Apr2008) to 15. Tamsulosin HCl 0.4 MG Oral Capsule;  Therapy: 27Jan2014 to 16. Ursodiol 300 MG Oral Capsule; Therapy: 23Oct2013 to  Allergies Medication  1. Coumadin TABS 2. Lovenox SOLN Non-Medication  3. Latex  Family History Problems  1. Paternal history of  Cardiac Failure  Social History Problems  1. Marital History - Currently Married 2. Occupation: retired 3. History of  Tobacco Use V15.82 1 ppd for 5 years, quti smoking 1964 Denied  4. Alcohol Use  Vitals Vital Signs [Data Includes: Last 1 Day]  06Jun2014 03:17PM  BMI Calculated: 35.31 BSA Calculated: 2.18 Height: 5 ft 8 in Weight: 233 lb  Blood Pressure: 135 / 77 Temperature: 98.1 F Heart Rate: 92  Physical Exam Constitutional: Well nourished and well developed . No acute distress.  Pulmonary: No respiratory distress and normal respiratory rhythm and effort.  Cardiovascular: Heart rate and rhythm are normal . No peripheral edema.    Results/Data Urine [Data Includes: Last 1 Day]   06Jun2014  COLOR YELLOW   APPEARANCE CLOUDY   SPECIFIC GRAVITY 1.010   pH 6.0   GLUCOSE NEG mg/dL  BILIRUBIN NEG   KETONE NEG mg/dL  BLOOD MOD   PROTEIN TRACE mg/dL  UROBILINOGEN 0.2 mg/dL  NITRITE POS   LEUKOCYTE ESTERASE LARGE   SQUAMOUS EPITHELIAL/HPF RARE   WBC 21-50 WBC/hpf  RBC 3-6 RBC/hpf  BACTERIA FEW   CRYSTALS NONE SEEN   CASTS NONE SEEN     Urine has been cultured   Assessment Assessed  1. Urinary Tract Infection 599.0 2. Ureteral Obstruction 593.4 3. Prostate Cancer 185 4. Metastasis Of Malignant Neoplasm To  Bone 198.5  Plan Health Maintenance (V70.0)  1. UA With REFLEX  Done: 06Jun2014 03:04PM Ureteral Obstruction (593.4)  2. Follow-up Office  Follow-up  Requested for: 06Jun2014  Discussion/Summary  1.  Urinary tract infection: His urine has been cultured today.  If clear, I will plan to proceed with ureteral stent change bilaterally in the near future and will likely keep his PICC line until after his stent has been removed.  If he does have  persistent bacterial growth, I will restart him on IV antibiotics and plan to change his stent while he is on antibiotic therapy.  2.  Metastatic prostate cancer: We discussed options for treatment.  At this time, we have decided to hold off on Provenge.  I did inform him that ends alluded to moderate hopefully will be approved later this year in this particular setting and may be an easier treatment for him to tolerate.  We will discuss this further in the future.  3.  Ureteral obstruction/chronic kidney disease: His renal function did not improve after ureteral stent placement.  However, he does appear to have significant locally advanced disease and likely will develop ureteral obstruction in the near future without stent drainage.  I therefore recommended that we continue with chronic ureteral stent drainage to maintain his renal function.  His bilateral ureteral stents will be changed in the near future.  We've reviewed the potential risks and complications associated with this procedure and the expected recovery process.  4.  Testosterone deficiency/bone metastases/osteoporosis: We will revisit the option for monthly XG Eva injections later this summer.  He hopefully will have seen his dentist by then.  5.  Urethral stricture: He will continue catheterization weekly to keep his stricture open.  Cc: Dr. Alysia Penna Dr. Marina Gravel Dr. Drue Second     Verified Results URINE CULTURE1 06Jun2014 16:10RU0 Lyda Perone  SOURCE : CLEAN CATCH SPECIMEN TYPE: CLEAN CATCH  [Oct 27, 2012 4:43AM Cyntha Brickman] Please notify patient that urine culture did not grow out bacteria. He should plan to keep PICC line in place this week and I am working on getting him scheduler for his stent change next week and we will get PICC line out after that procedure.   Test Name Result Flag Reference  CULTURE, URINE1 Culture, Urine1    ===== COLONY COUNT: =====  4,000 COLONIES/ML   FINAL REPORT:  INSIGNIFICANT GROWTH     1. Amended By: Heloise Purpura; 10/27/2012 4:43 AMEST  Signatures Electronically signed by : Heloise Purpura, M.D.; Oct 27 2012  4:43AM

## 2012-11-03 ENCOUNTER — Encounter (HOSPITAL_COMMUNITY): Payer: Self-pay | Admitting: *Deleted

## 2012-11-03 ENCOUNTER — Encounter (HOSPITAL_COMMUNITY)
Admission: RE | Disposition: A | Discharge status: Home / Self Care | Payer: Self-pay | Source: Ambulatory Visit | Attending: Urology

## 2012-11-03 ENCOUNTER — Encounter (HOSPITAL_COMMUNITY): Payer: Self-pay

## 2012-11-03 ENCOUNTER — Ambulatory Visit (HOSPITAL_COMMUNITY): Payer: Medicare Other | Admitting: *Deleted

## 2012-11-03 ENCOUNTER — Ambulatory Visit (HOSPITAL_COMMUNITY)
Admission: RE | Admit: 2012-11-03 | Discharge: 2012-11-03 | Disposition: A | Discharge status: Home / Self Care | Payer: Medicare Other | Source: Ambulatory Visit | Attending: Urology | Admitting: Urology

## 2012-11-03 DIAGNOSIS — K219 Gastro-esophageal reflux disease without esophagitis: Secondary | ICD-10-CM | POA: Insufficient documentation

## 2012-11-03 DIAGNOSIS — N390 Urinary tract infection, site not specified: Secondary | ICD-10-CM | POA: Insufficient documentation

## 2012-11-03 DIAGNOSIS — Z79899 Other long term (current) drug therapy: Secondary | ICD-10-CM | POA: Insufficient documentation

## 2012-11-03 DIAGNOSIS — Z7982 Long term (current) use of aspirin: Secondary | ICD-10-CM | POA: Insufficient documentation

## 2012-11-03 DIAGNOSIS — C7951 Secondary malignant neoplasm of bone: Secondary | ICD-10-CM | POA: Insufficient documentation

## 2012-11-03 DIAGNOSIS — I4891 Unspecified atrial fibrillation: Secondary | ICD-10-CM | POA: Insufficient documentation

## 2012-11-03 DIAGNOSIS — Z794 Long term (current) use of insulin: Secondary | ICD-10-CM | POA: Insufficient documentation

## 2012-11-03 DIAGNOSIS — N138 Other obstructive and reflux uropathy: Secondary | ICD-10-CM | POA: Insufficient documentation

## 2012-11-03 DIAGNOSIS — E119 Type 2 diabetes mellitus without complications: Secondary | ICD-10-CM | POA: Insufficient documentation

## 2012-11-03 DIAGNOSIS — Z85038 Personal history of other malignant neoplasm of large intestine: Secondary | ICD-10-CM | POA: Insufficient documentation

## 2012-11-03 DIAGNOSIS — C61 Malignant neoplasm of prostate: Secondary | ICD-10-CM | POA: Insufficient documentation

## 2012-11-03 DIAGNOSIS — I129 Hypertensive chronic kidney disease with stage 1 through stage 4 chronic kidney disease, or unspecified chronic kidney disease: Secondary | ICD-10-CM | POA: Insufficient documentation

## 2012-11-03 DIAGNOSIS — R5383 Other fatigue: Secondary | ICD-10-CM | POA: Insufficient documentation

## 2012-11-03 DIAGNOSIS — I252 Old myocardial infarction: Secondary | ICD-10-CM | POA: Insufficient documentation

## 2012-11-03 DIAGNOSIS — N189 Chronic kidney disease, unspecified: Secondary | ICD-10-CM | POA: Insufficient documentation

## 2012-11-03 DIAGNOSIS — E291 Testicular hypofunction: Secondary | ICD-10-CM | POA: Insufficient documentation

## 2012-11-03 DIAGNOSIS — N1330 Unspecified hydronephrosis: Secondary | ICD-10-CM | POA: Insufficient documentation

## 2012-11-03 DIAGNOSIS — C7952 Secondary malignant neoplasm of bone marrow: Secondary | ICD-10-CM | POA: Insufficient documentation

## 2012-11-03 DIAGNOSIS — Z950 Presence of cardiac pacemaker: Secondary | ICD-10-CM | POA: Insufficient documentation

## 2012-11-03 DIAGNOSIS — R5381 Other malaise: Secondary | ICD-10-CM | POA: Insufficient documentation

## 2012-11-03 DIAGNOSIS — N401 Enlarged prostate with lower urinary tract symptoms: Secondary | ICD-10-CM | POA: Insufficient documentation

## 2012-11-03 DIAGNOSIS — Z951 Presence of aortocoronary bypass graft: Secondary | ICD-10-CM | POA: Insufficient documentation

## 2012-11-03 DIAGNOSIS — N135 Crossing vessel and stricture of ureter without hydronephrosis: Secondary | ICD-10-CM | POA: Insufficient documentation

## 2012-11-03 HISTORY — PX: CYSTOSCOPY W/ URETERAL STENT PLACEMENT: SHX1429

## 2012-11-03 LAB — GLUCOSE, CAPILLARY
Glucose-Capillary: 156 mg/dL — ABNORMAL HIGH (ref 70–99)
Glucose-Capillary: 152 mg/dL — ABNORMAL HIGH (ref 70–99)

## 2012-11-03 SURGERY — CYSTOSCOPY, FLEXIBLE, WITH STENT REPLACEMENT
Anesthesia: General | Laterality: Bilateral

## 2012-11-03 MED ORDER — PROMETHAZINE HCL 25 MG/ML IJ SOLN: 6.2500 mg | INTRAMUSCULAR | Status: DC | PRN

## 2012-11-03 MED ORDER — ONDANSETRON HCL 4 MG/2ML IJ SOLN
INTRAMUSCULAR | Status: DC | PRN
  Administered 2012-11-03: 4 mg via INTRAVENOUS

## 2012-11-03 MED ORDER — HEPARIN SOD (PORK) LOCK FLUSH 100 UNIT/ML IV SOLN
INTRAVENOUS | Status: AC
  Administered 2012-11-03: 250 [IU]
  Filled 2012-11-03: qty 5

## 2012-11-03 MED ORDER — HEPARIN SOD (PORK) LOCK FLUSH 100 UNIT/ML IV SOLN: 250.0000 [IU] | INTRAVENOUS | Status: AC | PRN

## 2012-11-03 MED ORDER — KETAMINE HCL 10 MG/ML IJ SOLN
INTRAMUSCULAR | Status: DC | PRN
  Administered 2012-11-03: 30 mg via INTRAVENOUS

## 2012-11-03 MED ORDER — PROPOFOL 10 MG/ML IV BOLUS
INTRAVENOUS | Status: DC | PRN
  Administered 2012-11-03: 170 mg via INTRAVENOUS

## 2012-11-03 MED ORDER — METOCLOPRAMIDE HCL 5 MG/ML IJ SOLN
INTRAMUSCULAR | Status: DC | PRN
  Administered 2012-11-03: 10 mg via INTRAVENOUS

## 2012-11-03 MED ORDER — MEPERIDINE HCL 50 MG/ML IJ SOLN: 6.2500 mg | INTRAMUSCULAR | Status: DC | PRN

## 2012-11-03 MED ORDER — LACTATED RINGERS IV SOLN
INTRAVENOUS | Status: DC | PRN
  Administered 2012-11-03: 10:00:00 via INTRAVENOUS

## 2012-11-03 MED ORDER — FENTANYL CITRATE 0.05 MG/ML IJ SOLN: 25.0000 ug | INTRAMUSCULAR | Status: DC | PRN

## 2012-11-03 MED ORDER — SODIUM CHLORIDE 0.9 % IJ SOLN
10.0000 mL | Freq: Once | INTRAMUSCULAR | Status: AC
  Administered 2012-11-03: 10 mL via INTRAVENOUS

## 2012-11-03 MED ORDER — PHENAZOPYRIDINE HCL 100 MG PO TABS: 100.0000 mg | ORAL_TABLET | Freq: Three times a day (TID) | ORAL | Status: DC | PRN

## 2012-11-03 MED ORDER — EPHEDRINE SULFATE 50 MG/ML IJ SOLN
INTRAMUSCULAR | Status: DC | PRN
  Administered 2012-11-03: 10 mg via INTRAVENOUS

## 2012-11-03 MED ORDER — DEXTROSE 5 % IV SOLN
1.0000 g | Freq: Two times a day (BID) | INTRAVENOUS | Status: DC
  Administered 2012-11-03: 1 g via INTRAVENOUS
  Filled 2012-11-03: qty 1

## 2012-11-03 MED ORDER — FENTANYL CITRATE 0.05 MG/ML IJ SOLN
INTRAMUSCULAR | Status: DC | PRN
  Administered 2012-11-03: 100 ug via INTRAVENOUS

## 2012-11-03 MED ORDER — PHENYLEPHRINE HCL 10 MG/ML IJ SOLN
INTRAMUSCULAR | Status: DC | PRN
  Administered 2012-11-03: 80 ug via INTRAVENOUS
  Administered 2012-11-03: 120 ug via INTRAVENOUS

## 2012-11-03 SURGICAL SUPPLY — 15 items
ADAPTER CATH URET PLST 4-6FR (CATHETERS) ×2 IMPLANT
BAG URO CATCHER STRL LF (DRAPE) ×2 IMPLANT
BASKET ZERO TIP NITINOL 2.4FR (BASKET) IMPLANT
BSKT STON RTRVL ZERO TP 2.4FR (BASKET)
CATH INTERMIT  6FR 70CM (CATHETERS) ×2 IMPLANT
CLOTH BEACON ORANGE TIMEOUT ST (SAFETY) ×2 IMPLANT
DRAPE CAMERA CLOSED 9X96 (DRAPES) ×2 IMPLANT
GLOVE BIOGEL M STRL SZ7.5 (GLOVE) ×2 IMPLANT
GOWN STRL NON-REIN LRG LVL3 (GOWN DISPOSABLE) ×4 IMPLANT
GUIDEWIRE ANG ZIPWIRE 038X150 (WIRE) IMPLANT
GUIDEWIRE STR DUAL SENSOR (WIRE) ×2 IMPLANT
MANIFOLD NEPTUNE II (INSTRUMENTS) ×2 IMPLANT
PACK CYSTO (CUSTOM PROCEDURE TRAY) ×2 IMPLANT
STENT CONTOUR 7FRX24 (STENTS) ×4 IMPLANT
TUBING CONNECTING 10 (TUBING) ×2 IMPLANT

## 2012-11-03 NOTE — Anesthesia Preprocedure Evaluation (Signed)
Anesthesia Evaluation  Patient identified by MRN, date of birth, ID band Patient awake    Reviewed: Allergy & Precautions, H&P , NPO status , Patient's Chart, lab work & pertinent test results, reviewed documented beta blocker date and time   Airway Mallampati: III TM Distance: >3 FB Neck ROM: Full    Dental  (+) Dental Advisory Given, Teeth Intact and Caps   Pulmonary shortness of breath and with exertion, sleep apnea , former smoker,  breath sounds clear to auscultation        Cardiovascular hypertension, Pt. on medications and Pt. on home beta blockers + angina + CAD, + Past MI, + CABG and +CHF + dysrhythmias Atrial Fibrillation + pacemaker + Cardiac Defibrillator Rhythm:Regular Rate:Normal   Left ventricle: The cavity size was normal. Wall thickness   was increased in a pattern of mild LVH. Systolic function   was mildly reduced. The estimated ejection fraction was in  the range of 45% to 50%. Septal bounce. Diffuse   hypokinesis. - Aortic valve: There was no stenosis. - Mitral valve: Mild regurgitation. - Left atrium: The atrium was mildly to moderately dilated. - Right ventricle: The cavity size was mildly dilated.   Systolic function was mildly reduced. - Right atrium: The atrium was mildly to moderately dilated. - Tricuspid valve: Peak RV-RA gradient: 34mm Hg (S). - Pulmonary arteries: PA peak pressure: 44mm Hg (S). - Systemic veins: IVC not visualized.  Impressions: - Technically difficult study with poor acoustic windows.   The patient was in atrial fibrillation. Normal LV size   with mild LV hypertrophy. EF 45-50%, mild global   hypokinesis. Mildly dilated RV with mildly decreased   systolic function. MIld pulmonary hypertension. Biatrial   enlargement.  Pt 98% AV paced   Neuro/Psych Seizures -, Well Controlled,  negative psych ROS   GI/Hepatic Neg liver ROS, GERD-  ,  Endo/Other  diabetes, Type 2   Renal/GU ARF and Renal InsufficiencyRenal diseasenegative Renal ROS     Musculoskeletal negative musculoskeletal ROS (+)   Abdominal (+) + obese,   Peds  Hematology negative hematology ROS (+)   Anesthesia Other Findings   Reproductive/Obstetrics                           Anesthesia Physical  Anesthesia Plan  ASA: IV  Anesthesia Plan: General   Post-op Pain Management:    Induction: Intravenous  Airway Management Planned: LMA  Additional Equipment:   Intra-op Plan:   Post-operative Plan: Extubation in OR  Informed Consent: I have reviewed the patients History and Physical, chart, labs and discussed the procedure including the risks, benefits and alternatives for the proposed anesthesia with the patient or authorized representative who has indicated his/her understanding and acceptance.   Dental advisory given  Plan Discussed with: CRNA  Anesthesia Plan Comments: (Magnet in room.)        Anesthesia Quick Evaluation

## 2012-11-03 NOTE — Transfer of Care (Signed)
Immediate Anesthesia Transfer of Care Note  Patient: Jeffery Gibson  Procedure(s) Performed: Procedure(s) (LRB): CYSTOSCOPY WITH STENT REPLACEMENT (Bilateral)  Patient Location: PACU  Anesthesia Type: General  Level of Consciousness: sedated, patient cooperative and responds to stimulaton  Airway & Oxygen Therapy: Patient Spontanous Breathing and Patient connected to face mask oxgen  Post-op Assessment: Report given to PACU RN and Post -op Vital signs reviewed and stable  Post vital signs: Reviewed and stable  Complications: No apparent anesthesia complications

## 2012-11-03 NOTE — Preoperative (Signed)
Beta Blockers   Reason not to administer Beta Blockers:Not Applicable, took BB this am 

## 2012-11-03 NOTE — Anesthesia Postprocedure Evaluation (Signed)
  Anesthesia Post-op Note  Patient: Jeffery Gibson  Procedure(s) Performed: Procedure(s) (LRB): CYSTOSCOPY WITH STENT REPLACEMENT (Bilateral)  Patient Location: PACU  Anesthesia Type: General  Level of Consciousness: awake and alert   Airway and Oxygen Therapy: Patient Spontanous Breathing  Post-op Pain: mild  Post-op Assessment: Post-op Vital signs reviewed, Patient's Cardiovascular Status Stable, Respiratory Function Stable, Patent Airway and No signs of Nausea or vomiting  Last Vitals:  Filed Vitals:   11/03/12 1210  BP: 145/71  Pulse: 64  Temp: 36.3 C  Resp: 14    Post-op Vital Signs: stable   Complications: No apparent anesthesia complications

## 2012-11-03 NOTE — Interval H&P Note (Signed)
History and Physical Interval Note:  11/03/2012 10:22 AM  Jeffery Gibson  has presented today for surgery, with the diagnosis of BILATERAL URETERAL OBSTRUCTION  The various methods of treatment have been discussed with the patient and family. After consideration of risks, benefits and other options for treatment, the patient has consented to  Procedure(s): CYSTOSCOPY WITH STENT REPLACEMENT (Bilateral) as a surgical intervention .  The patient's history has been reviewed, patient examined, no change in status, stable for surgery.  I have reviewed the patient's chart and labs.  Questions were answered to the patient's satisfaction.     Helina Hullum,LES

## 2012-11-03 NOTE — Op Note (Signed)
Preoperative diagnosis:  1. Metastatic prostate cancer   Postoperative diagnosis:  1. Metastatic prostate cancer   Procedure:  1. Cystoscopy 2. Bilateral ureteral stent placement (7 x 24)  Surgeon: Rolly Salter, Montez Hageman. M.D.  Anesthesia: General  Complications: None  Intraoperative findings: He had no encrustation of his ureteral stents.  EBL: Minimal  Specimens: None  Indication: Jeffery Gibson is a 79 y.o. patient with ureteral obstruction secondary to metastatic prostate cancer. After reviewing the management options for treatment, he elected to proceed with the above surgical procedure(s). We have discussed the potential benefits and risks of the procedure, side effects of the proposed treatment, the likelihood of the patient achieving the goals of the procedure, and any potential problems that might occur during the procedure or recuperation. Informed consent has been obtained.  Description of procedure:  The patient was taken to the operating room and general anesthesia was induced.  The patient was placed in the dorsal lithotomy position, prepped and draped in the usual sterile fashion, and preoperative antibiotics were administered. A preoperative time-out was performed.   Cystourethroscopy was performed.  The patient's urethra was examined and was normal up to the prostatic urethra where the urethra was fixed and rigid with evidence of local tumor growth near the bladder neck and trigone. The bladder was then systematically examined in its entirety. There was no evidence for any bladder tumors, stones, or other mucosal pathology.    Attention then turned to the right ureteral orifice and the patient's indwelling ureteral stent was identified and brought out to the urethral meatus with the flexible graspers.  A 0.38 sensor guidewire was then advanced up the right ureter into the renal pelvis under fluoroscopic guidance.  The wire was then backloaded through the cystoscope  and a ureteral stent was advance over the wire using Seldinger technique.  The stent was positioned appropriately under fluoroscopic and cystoscopic guidance.  The wire was then removed with an adequate stent curl noted in the renal pelvis as well as in the bladder.  An identical procedure was then performed on the contralateral side.  The bladder was then emptied and the procedure ended.  The patient appeared to tolerate the procedure well and without complications.  The patient was able to be awakened and transferred to the recovery unit in satisfactory condition.    Jeffery Bruins MD

## 2012-11-04 ENCOUNTER — Encounter (HOSPITAL_COMMUNITY): Payer: Self-pay | Admitting: Urology

## 2012-11-06 ENCOUNTER — Other Ambulatory Visit (HOSPITAL_BASED_OUTPATIENT_CLINIC_OR_DEPARTMENT_OTHER): Payer: Medicare Other | Admitting: Lab

## 2012-11-06 ENCOUNTER — Ambulatory Visit (HOSPITAL_BASED_OUTPATIENT_CLINIC_OR_DEPARTMENT_OTHER): Payer: Medicare Other

## 2012-11-06 VITALS — BP 128/59 | HR 86 | Temp 98.6°F

## 2012-11-06 DIAGNOSIS — D631 Anemia in chronic kidney disease: Secondary | ICD-10-CM

## 2012-11-06 DIAGNOSIS — N189 Chronic kidney disease, unspecified: Secondary | ICD-10-CM

## 2012-11-06 DIAGNOSIS — N039 Chronic nephritic syndrome with unspecified morphologic changes: Secondary | ICD-10-CM

## 2012-11-06 LAB — CBC WITH DIFFERENTIAL/PLATELET
BASO%: 0.2 % (ref 0.0–2.0)
Basophils Absolute: 0 10*3/uL (ref 0.0–0.1)
HCT: 30.5 % — ABNORMAL LOW (ref 38.4–49.9)
HGB: 10 g/dL — ABNORMAL LOW (ref 13.0–17.1)
MCHC: 32.8 g/dL (ref 32.0–36.0)
MONO#: 0.7 10*3/uL (ref 0.1–0.9)
NEUT#: 6.2 10*3/uL (ref 1.5–6.5)
NEUT%: 77.3 % — ABNORMAL HIGH (ref 39.0–75.0)
WBC: 8 10*3/uL (ref 4.0–10.3)
lymph#: 0.8 10*3/uL — ABNORMAL LOW (ref 0.9–3.3)

## 2012-11-06 MED ORDER — DARBEPOETIN ALFA-POLYSORBATE 300 MCG/0.6ML IJ SOLN
300.0000 ug | Freq: Once | INTRAMUSCULAR | Status: AC
  Administered 2012-11-06: 300 ug via SUBCUTANEOUS
  Filled 2012-11-06: qty 0.6

## 2012-12-04 ENCOUNTER — Other Ambulatory Visit (HOSPITAL_BASED_OUTPATIENT_CLINIC_OR_DEPARTMENT_OTHER): Payer: Medicare Other

## 2012-12-04 ENCOUNTER — Ambulatory Visit (HOSPITAL_BASED_OUTPATIENT_CLINIC_OR_DEPARTMENT_OTHER): Payer: Medicare Other

## 2012-12-04 VITALS — BP 119/67 | HR 80 | Temp 98.3°F

## 2012-12-04 DIAGNOSIS — C61 Malignant neoplasm of prostate: Secondary | ICD-10-CM

## 2012-12-04 DIAGNOSIS — D631 Anemia in chronic kidney disease: Secondary | ICD-10-CM

## 2012-12-04 DIAGNOSIS — N189 Chronic kidney disease, unspecified: Secondary | ICD-10-CM

## 2012-12-04 DIAGNOSIS — N039 Chronic nephritic syndrome with unspecified morphologic changes: Secondary | ICD-10-CM

## 2012-12-04 LAB — COMPREHENSIVE METABOLIC PANEL (CC13)
Albumin: 3 g/dL — ABNORMAL LOW (ref 3.5–5.0)
Alkaline Phosphatase: 94 U/L (ref 40–150)
BUN: 37.4 mg/dL — ABNORMAL HIGH (ref 7.0–26.0)
CO2: 20 mEq/L — ABNORMAL LOW (ref 22–29)
Calcium: 6.1 mg/dL — CL (ref 8.4–10.4)
Chloride: 107 mEq/L (ref 98–109)
Glucose: 188 mg/dl — ABNORMAL HIGH (ref 70–140)
Potassium: 4.3 mEq/L (ref 3.5–5.1)

## 2012-12-04 LAB — CBC WITH DIFFERENTIAL/PLATELET
Basophils Absolute: 0.1 10*3/uL (ref 0.0–0.1)
Eosinophils Absolute: 0.3 10*3/uL (ref 0.0–0.5)
HGB: 10.4 g/dL — ABNORMAL LOW (ref 13.0–17.1)
MCV: 93.3 fL (ref 79.3–98.0)
MONO%: 8.9 % (ref 0.0–14.0)
NEUT#: 6 10*3/uL (ref 1.5–6.5)
RDW: 16.3 % — ABNORMAL HIGH (ref 11.0–14.6)

## 2012-12-04 MED ORDER — DARBEPOETIN ALFA-POLYSORBATE 300 MCG/0.6ML IJ SOLN
300.0000 ug | Freq: Once | INTRAMUSCULAR | Status: AC
  Administered 2012-12-04: 300 ug via SUBCUTANEOUS
  Filled 2012-12-04: qty 0.6

## 2012-12-09 ENCOUNTER — Encounter: Payer: Self-pay | Admitting: Cardiology

## 2012-12-09 ENCOUNTER — Ambulatory Visit (INDEPENDENT_AMBULATORY_CARE_PROVIDER_SITE_OTHER): Payer: Medicare Other | Admitting: Cardiology

## 2012-12-09 VITALS — BP 110/60 | HR 72 | Ht 68.0 in | Wt 242.0 lb

## 2012-12-09 DIAGNOSIS — I2510 Atherosclerotic heart disease of native coronary artery without angina pectoris: Secondary | ICD-10-CM

## 2012-12-09 DIAGNOSIS — I4891 Unspecified atrial fibrillation: Secondary | ICD-10-CM

## 2012-12-09 DIAGNOSIS — I480 Paroxysmal atrial fibrillation: Secondary | ICD-10-CM

## 2012-12-09 DIAGNOSIS — I10 Essential (primary) hypertension: Secondary | ICD-10-CM

## 2012-12-09 NOTE — Assessment & Plan Note (Signed)
The patient has not been experiencing any chest pain or angina pectoris.  He does have some left lateral position we'll anterior chest discomfort related to a metastasis of his prostate cancer to his left sixth rib

## 2012-12-09 NOTE — Assessment & Plan Note (Signed)
The patient has not been experiencing any awareness of rapid heartbeat or atrial fibrillation

## 2012-12-09 NOTE — Progress Notes (Signed)
[name] Date of Birth:  1928-09-29 [facility] [address] [place], [place]  [address] [phone]         Fax   [phone]  History of Present Illness: This pleasant 79 year old gentleman is seen for a scheduled followup office visit. He has a complex past medical history. He has known ischemic heart disease. He is diabetic. He has a history of tachybradycardia syndrome and paroxysmal atrial fibrillation. He has a pacemaker. He has a malignant neoplasm of the prostate with metastases and recently underwent orchiectomy at [facility] on January 18, 2012. However his PSA is still rising slowly and most recently he was hospitalized and on a nuclear bone scan was found to have a metastatic prostate cancer to his left sixth rib.  The patient denies any recent chest pain or angina. His weight has been stable. He has not been having any significant peripheral edema.  Since last visit he has been having no new cardiac symptoms.   Current Outpatient Prescriptions  Medication Sig Dispense Refill  . acetaminophen (TYLENOL) 500 MG tablet Take 500 mg by mouth every 6 (six) hours as needed for pain or fever.      Marland Kitchen amiodarone (PACERONE) 200 MG tablet Take 100 mg by mouth every morning.       Marland Kitchen amLODipine (NORVASC) 5 MG tablet Take 5 mg by mouth every morning.      . Aromatic Inhalants (VICKS VAPOR INHALER IN) Inhale 1 puff into the lungs daily as needed (for dry air).      Marland Kitchen aspirin EC 81 MG tablet Take 81 mg by mouth every evening.       . Cholecalciferol (VITAMIN D) 2000 UNITS tablet Take 2,000 Units by mouth daily at 12 noon.       . Darbepoetin Alfa-Albumin (ARANESP IJ) Inject 1 Syringe as directed See admin instructions. Every 4 weeks as needed for anemia if hemoglobin is below 11      . finasteride (PROSCAR) 5 MG tablet Take 5 mg by mouth every evening.       . furosemide (LASIX) 40 MG tablet Take 40 mg by mouth every morning.       . insulin glargine (LANTUS  SOLOSTAR) 100 UNIT/ML injection Inject 15 Units into the skin at bedtime.      . metoprolol (LOPRESSOR) 50 MG tablet Take 1 tablet (50 mg total) by mouth 2 (two) times daily.  30 tablet  5  . mirabegron ER (MYRBETRIQ) 25 MG TB24 Take 50 mg by mouth every evening.       Marland Kitchen omeprazole (PRILOSEC) 20 MG capsule Take 1 capsule (20 mg total) by mouth every evening.  90 capsule  3  . Probiotic Product (ALIGN) 4 MG CAPS Take 1 capsule by mouth daily.      . rosuvastatin (CRESTOR) 10 MG tablet Take 5 mg by mouth every evening.       . Tamsulosin HCl (FLOMAX) 0.4 MG CAPS Take 0.4 mg by mouth daily after supper.       . ursodiol (ACTIGALL) 300 MG capsule Take 300 mg by mouth 2 (two) times daily.       . vitamin C (ASCORBIC ACID) 500 MG tablet Take 500 mg by mouth daily.       No current facility-administered medications for this visit.    Allergies  Allergen Reactions  . Adhesive (Tape) Rash    Rash and itching. USE PAPER TAPE ONLY  . Latex Hives  . Lovenox (  Enoxaparin Sodium)     Gi bleed  . Lupron (Leuprolide Acetate)     Kidney failure  . Warfarin And Related     Gi bleed    Patient Active Problem List   Diagnosis Date Noted  . BRADYCARDIA-TACHYCARDIA SYNDROME 07/26/2010    Priority: High  . PROSTATE CANCER 07/26/2010    Priority: Medium  . CAD 07/26/2010    Priority: Medium  . Hyponatremia 08/20/2012  . Sepsis secondary to urinary tract infection, complicated 08/19/2012  . Stage III chronic kidney disease 08/19/2012  . Normocytic anemia 08/19/2012  . Pulmonary nodule, right middle lobe 08/19/2012  . Pacemaker 06/16/2012  . Chronic systolic congestive heart failure 06/16/2012  . UTI (lower urinary tract infection) 11/13/2011  . Paroxysmal a-fib 11/09/2011  . Diabetes mellitus 11/07/2011  . HTN (hypertension) 11/07/2011  . Anemia associated with chronic renal failure 04/05/2011  . ADENOMATOUS COLONIC POLYP 07/26/2010  . HYPERLIPIDEMIA 07/26/2010  . MYOCARDIAL INFARCTION  07/26/2010    History  Smoking status  . Former Smoker -- 10 years  . Types: Cigarettes  . Quit date: 05/22/1963  Smokeless tobacco  . Never Used    History  Alcohol Use No    Family History  Problem Relation Age of Onset  . Heart failure Father 42  . Gallbladder disease Father     Review of Systems: Constitutional: no fever chills diaphoresis or fatigue or change in weight.  Head and neck: no hearing loss, no epistaxis, no photophobia or visual disturbance. Respiratory: No cough, shortness of breath or wheezing. Cardiovascular: No chest pain peripheral edema, palpitations. Gastrointestinal: No abdominal distention, no abdominal pain, no change in bowel habits hematochezia or melena. Genitourinary: No dysuria, no frequency, no urgency, no nocturia. Musculoskeletal:No arthralgias, no back pain, no gait disturbance or myalgias. Neurological: No dizziness, no headaches, no numbness, no seizures, no syncope, no weakness, no tremors. Hematologic: No lymphadenopathy, no easy bruising. Psychiatric: No confusion, no hallucinations, no sleep disturbance.    Physical Exam: Filed Vitals:   12/09/12 1106  BP: 110/60  Pulse: 72   the general appearance reveals a moderately obese gentleman in no distress.  His weight is up 4 pounds since last visit and he had a recent birthday and celebrated by eating sauerbraten which is his custom.The head and neck exam reveals pupils equal and reactive.  Extraocular movements are full.  There is no scleral icterus.  The mouth and pharynx are normal.  The neck is supple.  The carotids reveal no bruits.  The jugular venous pressure is normal.  The  thyroid is not enlarged.  There is no lymphadenopathy.  The chest is clear to percussion and auscultation.  There are no rales or rhonchi.  Expansion of the chest is symmetrical.  The precordium is quiet.  The first heart sound is normal.  The second heart sound is physiologically split.  There is no murmur  gallop rub or click.  There is no abnormal lift or heave.  The abdomen is soft and nontender.  The bowel sounds are normal.  The liver and spleen are not enlarged.  There are no abdominal masses.  There are no abdominal bruits.  Extremities reveal good pedal pulses.  There is no phlebitis or edema.  There is no cyanosis or clubbing.  Strength is normal and symmetrical in all extremities.  There is no lateralizing weakness.  There are no sensory deficits.  The skin is warm and dry.  There is no rash.     Assessment /  Plan: Overall patient is stable from cardiac standpoint.  Encouraged him to try to lose weight.  He be rechecked in 3 months for followup office visit and EKG.

## 2012-12-09 NOTE — Patient Instructions (Addendum)
Your physician recommends that you continue on your current medications as directed. Please refer to the Current Medication list given to you today.  Your physician wants you to follow-up in: 3 month ov/ekg You will receive a reminder letter in the mail two months in advance. If you don't receive a letter, please call our office to schedule the follow-up appointment.  

## 2012-12-09 NOTE — Assessment & Plan Note (Signed)
Blood pressure has been remaining stable on current therapy. 

## 2012-12-17 ENCOUNTER — Other Ambulatory Visit (HOSPITAL_COMMUNITY): Payer: Self-pay | Admitting: Internal Medicine

## 2012-12-17 DIAGNOSIS — B999 Unspecified infectious disease: Secondary | ICD-10-CM

## 2012-12-18 ENCOUNTER — Inpatient Hospital Stay (HOSPITAL_COMMUNITY): Admission: RE | Admit: 2012-12-18 | Payer: Medicare Other | Source: Ambulatory Visit

## 2012-12-18 ENCOUNTER — Other Ambulatory Visit (HOSPITAL_COMMUNITY): Payer: Self-pay | Admitting: Internal Medicine

## 2012-12-18 DIAGNOSIS — N3090 Cystitis, unspecified without hematuria: Secondary | ICD-10-CM

## 2012-12-22 ENCOUNTER — Ambulatory Visit (HOSPITAL_COMMUNITY)
Admission: RE | Admit: 2012-12-22 | Discharge: 2012-12-22 | Disposition: A | Discharge status: Home / Self Care | Payer: Medicare Other | Source: Ambulatory Visit | Attending: Internal Medicine | Admitting: Internal Medicine

## 2012-12-22 ENCOUNTER — Other Ambulatory Visit (HOSPITAL_COMMUNITY): Payer: Self-pay | Admitting: Internal Medicine

## 2012-12-22 DIAGNOSIS — N390 Urinary tract infection, site not specified: Secondary | ICD-10-CM | POA: Insufficient documentation

## 2012-12-22 DIAGNOSIS — N3090 Cystitis, unspecified without hematuria: Secondary | ICD-10-CM

## 2012-12-22 NOTE — Procedures (Signed)
Successful placement of dual lumen PICC line to left cephalic vein. Length 30cm Tip at lower mid-subclavian vein. No complications Ready for use.  Brayton El PA-C Interventional Radiology 12/22/2012 2:54 PM

## 2013-01-01 ENCOUNTER — Other Ambulatory Visit (HOSPITAL_BASED_OUTPATIENT_CLINIC_OR_DEPARTMENT_OTHER): Payer: Medicare Other | Admitting: Lab

## 2013-01-01 ENCOUNTER — Ambulatory Visit (HOSPITAL_BASED_OUTPATIENT_CLINIC_OR_DEPARTMENT_OTHER): Payer: Medicare Other

## 2013-01-01 VITALS — BP 124/67 | HR 81 | Temp 98.2°F

## 2013-01-01 DIAGNOSIS — N189 Chronic kidney disease, unspecified: Secondary | ICD-10-CM

## 2013-01-01 DIAGNOSIS — N039 Chronic nephritic syndrome with unspecified morphologic changes: Secondary | ICD-10-CM

## 2013-01-01 DIAGNOSIS — D631 Anemia in chronic kidney disease: Secondary | ICD-10-CM

## 2013-01-01 LAB — CBC WITH DIFFERENTIAL/PLATELET
Basophils Absolute: 0 10*3/uL (ref 0.0–0.1)
Eosinophils Absolute: 0.2 10*3/uL (ref 0.0–0.5)
HGB: 10.8 g/dL — ABNORMAL LOW (ref 13.0–17.1)
NEUT#: 6.9 10*3/uL — ABNORMAL HIGH (ref 1.5–6.5)
RDW: 15 % — ABNORMAL HIGH (ref 11.0–14.6)
WBC: 8.8 10*3/uL (ref 4.0–10.3)
lymph#: 1 10*3/uL (ref 0.9–3.3)
nRBC: 0 % (ref 0–0)

## 2013-01-01 MED ORDER — DARBEPOETIN ALFA-POLYSORBATE 300 MCG/0.6ML IJ SOLN
300.0000 ug | Freq: Once | INTRAMUSCULAR | Status: AC
  Administered 2013-01-01: 300 ug via SUBCUTANEOUS
  Filled 2013-01-01: qty 0.6

## 2013-01-26 ENCOUNTER — Encounter: Payer: Self-pay | Admitting: Internal Medicine

## 2013-01-26 ENCOUNTER — Ambulatory Visit (INDEPENDENT_AMBULATORY_CARE_PROVIDER_SITE_OTHER): Payer: Medicare Other | Admitting: *Deleted

## 2013-01-26 DIAGNOSIS — I495 Sick sinus syndrome: Secondary | ICD-10-CM

## 2013-01-29 ENCOUNTER — Other Ambulatory Visit: Payer: Self-pay | Admitting: Nephrology

## 2013-01-29 ENCOUNTER — Other Ambulatory Visit: Payer: Self-pay | Admitting: Emergency Medicine

## 2013-01-29 ENCOUNTER — Ambulatory Visit (HOSPITAL_BASED_OUTPATIENT_CLINIC_OR_DEPARTMENT_OTHER): Payer: Medicare Other

## 2013-01-29 ENCOUNTER — Other Ambulatory Visit (HOSPITAL_BASED_OUTPATIENT_CLINIC_OR_DEPARTMENT_OTHER): Payer: Medicare Other

## 2013-01-29 VITALS — BP 118/62 | HR 83 | Temp 98.2°F

## 2013-01-29 DIAGNOSIS — C61 Malignant neoplasm of prostate: Secondary | ICD-10-CM

## 2013-01-29 DIAGNOSIS — D631 Anemia in chronic kidney disease: Secondary | ICD-10-CM

## 2013-01-29 DIAGNOSIS — N189 Chronic kidney disease, unspecified: Secondary | ICD-10-CM

## 2013-01-29 DIAGNOSIS — N184 Chronic kidney disease, stage 4 (severe): Secondary | ICD-10-CM

## 2013-01-29 LAB — CBC WITH DIFFERENTIAL/PLATELET
Eosinophils Absolute: 0.3 10*3/uL (ref 0.0–0.5)
HCT: 31.5 % — ABNORMAL LOW (ref 38.4–49.9)
LYMPH%: 7.2 % — ABNORMAL LOW (ref 14.0–49.0)
MONO#: 0.7 10*3/uL (ref 0.1–0.9)
NEUT#: 6.1 10*3/uL (ref 1.5–6.5)
NEUT%: 79.5 % — ABNORMAL HIGH (ref 39.0–75.0)
Platelets: 164 10*3/uL (ref 140–400)
RBC: 3.43 10*6/uL — ABNORMAL LOW (ref 4.20–5.82)
WBC: 7.7 10*3/uL (ref 4.0–10.3)
lymph#: 0.6 10*3/uL — ABNORMAL LOW (ref 0.9–3.3)

## 2013-01-29 LAB — COMPREHENSIVE METABOLIC PANEL (CC13)
ALT: 6 U/L (ref 0–55)
CO2: 20 mEq/L — ABNORMAL LOW (ref 22–29)
Calcium: 6.3 mg/dL — CL (ref 8.4–10.4)
Chloride: 108 mEq/L (ref 98–109)
Glucose: 224 mg/dl — ABNORMAL HIGH (ref 70–140)
Sodium: 140 mEq/L (ref 136–145)
Total Bilirubin: 0.36 mg/dL (ref 0.20–1.20)
Total Protein: 7.2 g/dL (ref 6.4–8.3)

## 2013-01-29 MED ORDER — DARBEPOETIN ALFA-POLYSORBATE 500 MCG/ML IJ SOLN
300.0000 ug | Freq: Once | INTRAMUSCULAR | Status: AC
  Administered 2013-01-29: 300 ug via SUBCUTANEOUS
  Filled 2013-01-29: qty 1

## 2013-01-30 ENCOUNTER — Ambulatory Visit
Admission: RE | Admit: 2013-01-30 | Discharge: 2013-01-30 | Disposition: A | Discharge status: Home / Self Care | Payer: Medicare Other | Source: Ambulatory Visit | Attending: Nephrology | Admitting: Nephrology

## 2013-01-30 ENCOUNTER — Ambulatory Visit (HOSPITAL_BASED_OUTPATIENT_CLINIC_OR_DEPARTMENT_OTHER): Payer: Medicare Other

## 2013-01-30 ENCOUNTER — Other Ambulatory Visit: Payer: Self-pay | Admitting: Adult Health

## 2013-01-30 DIAGNOSIS — N184 Chronic kidney disease, stage 4 (severe): Secondary | ICD-10-CM

## 2013-01-30 MED ORDER — SODIUM CHLORIDE 0.9 % IV SOLN
2.0000 g | Freq: Once | INTRAVENOUS | Status: DC
  Administered 2013-01-30: 2 g via INTRAVENOUS
  Filled 2013-01-30: qty 20

## 2013-01-30 NOTE — Patient Instructions (Signed)
Hypocalcemia, Adult °Hypocalcemia is low blood calcium. Calcium is important for cells to function in the body. Low blood calcium can cause a variety of symptoms and problems. °CAUSES  °· Low levels of a body protein called albumin. °· Problems with the parathyroid glands or surgical removal of the parathyroid glands. The parathyroid glands maintain the body's level of calcium. °· Decreased production or improper use of parathyroid hormone. °· Lack (deficiency) of vitamin D or magnesium or both. °· Intestinal problems that interfere with nutrient absorption. °· Alcoholism. °· Kidney problems. °· Inflammation of the pancreas (pancreatitis). °· Certain medicines. °· Severe infections (sepsis). °· Infiltrative diseases. With these diseases the parathyroid glands are filled with cells or substances that are not normally present. Examples include: °· Sarcoidosis. °· Hemachromatosis. °· Breakdown of large amounts of muscle fiber. °· High levels of phosphate in the body. °· Cancer. °· Massive blood transfusions which usually occur with severe trauma. °SYMPTOMS  °· Numbness and tingling in the fingers, toes, or around the mouth. °· Muscle aches or cramps, especially in the legs, feet, and back. °· Muscle twitches. °· Shortness of breath or wheezing. °· Difficulty swallowing. °· Changes in the sound of the voice. °· General weakness. °· Fainting. °· Fast heart beats (palpitations). °· Chest pain. °· Irritability. °· Difficulty thinking. °· Memory problems or confusion. °· Severe fatigue. °· Changes in personality. °· Depression and anxiety. °· Shaking uncontrollably (seizures). °· Coarse, brittle hair and nails. °· Dry skin or lasting (chronic) skin diseases (psoriasis, eczema, or dermatitis). °· Clouding of the eye lens (cataracts). °· Abdominal cramping or pain. °DIAGNOSIS  °Hypocalcemia is usually diagnosed through blood tests that reveal a low level of blood calcium. Other tests, such as a recording of the electrical  activity of the heart (electrocardiogram, EKG), may be performed in order to diagnose the underlying cause of the condition. °TREATMENT  °Treatment for hypocalcemia includes giving calcium supplements. These can be given by mouth or by intravenous (IV) access tube, depending on the severity of the symptoms and deficiency. Other minerals (electrolytes), such as magnesium, may also be given. °HOME CARE INSTRUCTIONS  °· Meet with a dietitian to make sure you are eating the most healthful diet possible, or follow diet instructions as directed by your caregiver. °· Follow up with your caregiver as directed. °SEEK IMMEDIATE MEDICAL CARE IF:  °· You develop chest pain. °· You develop persistent rapid or irregular heartbeats. °· You have difficulty breathing. °· You faint. °· You develop increased fatigue. °· You have new swelling in the feet, ankles, or legs. °· You develop increased muscle twitching. °· You start to have seizures. °· You develop confusion. °· You develop mood, memory, or personality changes. °MAKE SURE YOU:  °· Understand these instructions. °· Will watch your condition. °· Will get help right away if you are not doing well or get worse. °Document Released: 10/25/2009 Document Revised: 07/30/2011 Document Reviewed: 10/25/2009 °Exit[employer]® Patient Information ©2014 [employer]. ° °

## 2013-02-02 LAB — REMOTE PACEMAKER DEVICE
AL AMPLITUDE: 2.8 mv
AL IMPEDENCE PM: 325 Ohm
BATTERY VOLTAGE: 2.7 V
RV LEAD AMPLITUDE: 11.2 mv
RV LEAD IMPEDENCE PM: 305 Ohm

## 2013-02-11 ENCOUNTER — Encounter: Payer: Self-pay | Admitting: *Deleted

## 2013-02-20 ENCOUNTER — Telehealth: Payer: Self-pay | Admitting: Oncology

## 2013-02-25 ENCOUNTER — Other Ambulatory Visit: Payer: Self-pay | Admitting: Emergency Medicine

## 2013-02-26 ENCOUNTER — Ambulatory Visit: Payer: Medicare Other

## 2013-02-26 ENCOUNTER — Ambulatory Visit (HOSPITAL_BASED_OUTPATIENT_CLINIC_OR_DEPARTMENT_OTHER): Payer: Medicare Other

## 2013-02-26 ENCOUNTER — Ambulatory Visit: Payer: Medicare Other | Admitting: Oncology

## 2013-02-26 ENCOUNTER — Other Ambulatory Visit (HOSPITAL_BASED_OUTPATIENT_CLINIC_OR_DEPARTMENT_OTHER): Payer: Medicare Other | Admitting: Lab

## 2013-02-26 ENCOUNTER — Other Ambulatory Visit: Payer: Medicare Other | Admitting: Lab

## 2013-02-26 VITALS — BP 128/72 | HR 83 | Temp 97.3°F | Resp 18

## 2013-02-26 DIAGNOSIS — D631 Anemia in chronic kidney disease: Secondary | ICD-10-CM

## 2013-02-26 DIAGNOSIS — N189 Chronic kidney disease, unspecified: Secondary | ICD-10-CM

## 2013-02-26 LAB — CBC WITH DIFFERENTIAL/PLATELET
Basophils Absolute: 0 10*3/uL (ref 0.0–0.1)
Eosinophils Absolute: 0.3 10*3/uL (ref 0.0–0.5)
HCT: 30.6 % — ABNORMAL LOW (ref 38.4–49.9)
LYMPH%: 10.1 % — ABNORMAL LOW (ref 14.0–49.0)
MCH: 30.1 pg (ref 27.2–33.4)
MCHC: 33 g/dL (ref 32.0–36.0)
MONO#: 0.9 10*3/uL (ref 0.1–0.9)
MONO%: 9.6 % (ref 0.0–14.0)
RBC: 3.36 10*6/uL — ABNORMAL LOW (ref 4.20–5.82)
RDW: 15 % — ABNORMAL HIGH (ref 11.0–14.6)
WBC: 8.9 10*3/uL (ref 4.0–10.3)
lymph#: 0.9 10*3/uL (ref 0.9–3.3)

## 2013-02-26 MED ORDER — DARBEPOETIN ALFA-POLYSORBATE 300 MCG/0.6ML IJ SOLN
300.0000 ug | Freq: Once | INTRAMUSCULAR | Status: AC
  Administered 2013-02-26: 300 ug via SUBCUTANEOUS
  Filled 2013-02-26: qty 0.6

## 2013-03-09 ENCOUNTER — Telehealth: Payer: Self-pay | Admitting: *Deleted

## 2013-03-09 ENCOUNTER — Ambulatory Visit: Payer: Medicare Other | Admitting: Oncology

## 2013-03-09 NOTE — Telephone Encounter (Signed)
Pt came in for his appt however kk is out sick. rs pt for 03/17/13 @ 8:45am and printed the schedule...td

## 2013-03-12 ENCOUNTER — Ambulatory Visit: Payer: Medicare Other | Admitting: Oncology

## 2013-03-13 ENCOUNTER — Other Ambulatory Visit: Payer: Self-pay

## 2013-03-13 MED ORDER — AMIODARONE HCL 200 MG PO TABS: 100.0000 mg | ORAL_TABLET | Freq: Every morning | ORAL | Status: DC

## 2013-03-16 ENCOUNTER — Other Ambulatory Visit: Payer: Self-pay | Admitting: *Deleted

## 2013-03-16 MED ORDER — INSULIN GLARGINE 100 UNIT/ML ~~LOC~~ SOLN: SUBCUTANEOUS | Status: DC

## 2013-03-17 ENCOUNTER — Ambulatory Visit (HOSPITAL_BASED_OUTPATIENT_CLINIC_OR_DEPARTMENT_OTHER): Payer: Medicare Other | Admitting: Oncology

## 2013-03-17 ENCOUNTER — Telehealth: Payer: Self-pay | Admitting: Oncology

## 2013-03-17 VITALS — BP 133/69 | HR 94 | Temp 97.9°F | Resp 18 | Ht 68.0 in | Wt 242.2 lb

## 2013-03-17 DIAGNOSIS — D631 Anemia in chronic kidney disease: Secondary | ICD-10-CM

## 2013-03-17 DIAGNOSIS — E559 Vitamin D deficiency, unspecified: Secondary | ICD-10-CM

## 2013-03-17 DIAGNOSIS — Z85038 Personal history of other malignant neoplasm of large intestine: Secondary | ICD-10-CM

## 2013-03-17 DIAGNOSIS — C61 Malignant neoplasm of prostate: Secondary | ICD-10-CM

## 2013-03-17 DIAGNOSIS — D638 Anemia in other chronic diseases classified elsewhere: Secondary | ICD-10-CM

## 2013-03-17 DIAGNOSIS — N289 Disorder of kidney and ureter, unspecified: Secondary | ICD-10-CM

## 2013-03-17 DIAGNOSIS — C7951 Secondary malignant neoplasm of bone: Secondary | ICD-10-CM

## 2013-03-17 NOTE — Patient Instructions (Signed)
Doing well  We will continue giving aranesp  Every 3 weels   i will see you back in March 2015

## 2013-03-17 NOTE — Telephone Encounter (Signed)
, °

## 2013-03-17 NOTE — Progress Notes (Signed)
OFFICE PROGRESS NOTE  CC: Dr. Almetta Lovely, Lorin Picket, MD [address] [place] [address]  DIAGNOSIS: 79 year old gentleman with  #1 stage II adenocarcinoma of the colon originally diagnosed in July 2005 on observation.  #2 prostate cancer status post brachii therapy is currently receiving Lupron every 6 month is followed by Dr. Luane School at [facility] Patient now with elevated PSA to 12. He also is noted to have a sixth rib bone metastasis consistent with recurrence of prostate cancer. He was seen by Dr. Laverle Patter and a full workup is in progress. Dairy even thinking about doing Provenge  #3 Anemia secondary to renal insufficiency and chronic disease on Aranesp injections 300 mg every 3 weeks to keep her hemoglobin at or above 11 g  CURRENT THERAPY: Aranesp 300 mg every 3 week,   INTERVAL HISTORY: Jeffery HEIDELBERGER 79 y.o. male returns for followup visit. He is doing well today. Clinically patient seems to be doing well. For his prostate cancer he was started on new medication however he cannot remember the name of that. He is tolerating it well except for some hot flashes. He does have ongoing peripheral neuropathy secondary to his diabetes. He denies any nausea vomiting no fevers chills. He does get short winded occasionally. Patient does have a new nephrologist since retirement of Dr. Marina Gravel. Remainder of the 10 point review of systems is negative.  MEDICAL HISTORY: Past Medical History  Diagnosis Date  . Ischemic heart disease 05/06/06    post CARG 05/06/06  . Obese     exogenous  . Renal insufficiency   . Anemia of chronic disease     aranesp injections  . Dyslipidemia   . Coronary artery disease   . Anemia associated with chronic renal failure 04/05/2011  . Cancer     prostate/on Lupron  . Adenocarcinoma of colon 11/2003    stage 2(T3,N0,M0)  . Diabetes mellitus   . Hypertension   . Anginal pain   . Myocardial infarction 1986   . Pacemaker   . Cataracts, bilateral   . Seizures   . Arthritis   . Hemorrhoids   . GERD (gastroesophageal reflux disease)     hx of  . Hematoma     hx of  . Atrial flutter     afib and atrial flutter (permanent)  . Tachycardia-bradycardia 1997    dual-chamber/for tachybradycardia syndrome  . Sleep apnea     no cpap    ALLERGIES:  is allergic to adhesive; latex; lovenox; lupron; and warfarin and related.  MEDICATIONS:  Current Outpatient Prescriptions  Medication Sig Dispense Refill  . acetaminophen (TYLENOL) 500 MG tablet Take 500 mg by mouth every 6 (six) hours as needed for pain or fever.      Marland Kitchen amiodarone (PACERONE) 200 MG tablet Take 0.5 tablets (100 mg total) by mouth every morning.  30 tablet  3  . amLODipine (NORVASC) 5 MG tablet Take 5 mg by mouth every morning.      . Aromatic Inhalants (VICKS VAPOR INHALER IN) Inhale 1 puff into the lungs daily as needed (for dry air).      Marland Kitchen aspirin EC 81 MG tablet Take 81 mg by mouth every evening.       . Cholecalciferol (VITAMIN D) 2000 UNITS tablet Take 2,000 Units by mouth daily at 12 noon.       . Darbepoetin Alfa-Albumin (ARANESP IJ) Inject 1 Syringe as directed See admin instructions. Every 4 weeks  as needed for anemia if hemoglobin is below 11      . enzalutamide (XTANDI) 40 MG capsule Take 160 mg by mouth daily.      . finasteride (PROSCAR) 5 MG tablet Take 5 mg by mouth every evening.       . furosemide (LASIX) 40 MG tablet Take 40 mg by mouth every morning.       . insulin glargine (LANTUS) 100 UNIT/ML injection 10 units daily or as directed  15 mL  11  . metoprolol (LOPRESSOR) 50 MG tablet Take 1 tablet (50 mg total) by mouth 2 (two) times daily.  30 tablet  5  . mirabegron ER (MYRBETRIQ) 25 MG TB24 Take 50 mg by mouth every evening.       Marland Kitchen omeprazole (PRILOSEC) 20 MG capsule Take 1 capsule (20 mg total) by mouth every evening.  90 capsule  3  . rosuvastatin (CRESTOR) 10 MG tablet Take 5 mg by mouth every evening.        . Tamsulosin HCl (FLOMAX) 0.4 MG CAPS Take 0.4 mg by mouth daily after supper.       . ursodiol (ACTIGALL) 300 MG capsule Take 300 mg by mouth 2 (two) times daily.       . vitamin C (ASCORBIC ACID) 500 MG tablet Take 500 mg by mouth daily.       No current facility-administered medications for this visit.    SURGICAL HISTORY:  Past Surgical History  Procedure Laterality Date  . Laparotomy  12/04/2003    resection of rectosigmoid carcinoma/  . Radioactive seed implant  05/2005    transperianeal placement I-125 for prostate cancer/# of  seeds 55  . Cardiac catheterization  05/01/06    EF 40%/diffuse 3 vessel CAD/tight L antereior descending artery stenosis/diffuse disease proximal L anterior descending  . Pacemaker removal  1997  . Fracture surgery      left ankle  . Insert / replace / remove pacemaker  2006  . Colon surgery  2005  . Coronary artery bypass graft  05/06/06    x4 with L internal mammary artery to the L anterior descending coronary artery  . Laser surgery  2012    prostate  . Orchiectomy  august 2013  . Cystoscopy w/ ureteral stent placement Bilateral 08/04/2012    Procedure: CYSTOSCOPY WITH bilateral retrograde;  Surgeon: Crecencio Mc, MD;  Location: [facility];  Service: Urology;  Laterality: Bilateral;  attempted stent placement   . Peripherally inserted central catheter insertion  10-30-12    right upper arm-Antibiotic IV "urinary pseudomonas"  . Cystoscopy w/ ureteral stent placement Bilateral 11/03/2012    Procedure: CYSTOSCOPY WITH STENT REPLACEMENT;  Surgeon: Crecencio Mc, MD;  Location: [facility];  Service: Urology;  Laterality: Bilateral;    REVIEW OF SYSTEMS:   General: fatigue (+), night sweats (-), fever (-), pain (-) Lymph: palpable nodes (-) HEENT: vision changes (-), mucositis (-), gum bleeding (-), epistaxis (-) Cardiovascular: chest pain (-), palpitations (-) Pulmonary: shortness of breath (-), dyspnea on exertion (-), cough (-), hemoptysis (-) GI:  Early  satiety (-), melena (-), dysphagia (-), nausea/vomiting (-), diarrhea (-) GU: dysuria (-), hematuria (-), incontinence (-) Musculoskeletal: joint swelling (-), joint pain (-), back pain (-) Neuro: weakness (-), numbness (-), headache (-), confusion (-) Skin: Rash (-), lesions (-), dryness (-) Psych: depression (-), suicidal/homicidal ideation (-), feeling of hopelessness (-)   PHYSICAL EXAMINATION: BP 133/69  Pulse 94  Temp(Src) 97.9 F (36.6 C) (Oral)  Resp 18  Ht 5\' 8"  (1.727 m)  Wt 242 lb 3.2 oz (109.861 kg)  BMI 36.83 kg/m2 General: Patient is a well appearing male in no acute distress HEENT: PERRLA, sclerae anicteric no conjunctival pallor, MMM Neck: supple, no palpable adenopathy Lungs: clear to auscultation bilaterally, no wheezes, rhonchi, or rales Cardiovascular: regular rate rhythm, S1, S2, no murmurs, rubs or gallops Abdomen: Soft, non-tender, non-distended, normoactive bowel sounds, no HSM Extremities: warm and well perfused, no clubbing, cyanosis, or edema Skin: No rashes or lesions Neuro: Non-focal ECOG PERFORMANCE STATUS: 1 - Symptomatic but completely ambulatory  LABORATORY DATA: Lab Results  Component Value Date   WBC 8.9 02/26/2013   HGB 10.1* 02/26/2013   HCT 30.6* 02/26/2013   MCV 91.1 02/26/2013   PLT 158 02/26/2013      Chemistry      Component Value Date/Time   NA 140 01/29/2013 0903   NA 133* 10/30/2012 1010   NA 141 02/09/2010 1156   K 4.5 01/29/2013 0903   K 4.4 10/30/2012 1010   K 4.5 02/09/2010 1156   CL 96 10/30/2012 1010   CL 104 07/10/2012 1115   CL 98 02/09/2010 1156   CO2 20* 01/29/2013 0903   CO2 24 10/30/2012 1010   CO2 25 02/09/2010 1156   BUN 45.5* 01/29/2013 0903   BUN 47* 10/30/2012 1010   BUN 36* 02/09/2010 1156   CREATININE 3.6* 01/29/2013 0903   CREATININE 2.41* 10/30/2012 1010   CREATININE 2.88* 12/28/2011 1546      Component Value Date/Time   CALCIUM 6.3* 01/29/2013 0903   CALCIUM 9.3 10/30/2012 1010   CALCIUM 8.6 02/09/2010 1156    ALKPHOS 75 01/29/2013 0903   ALKPHOS 95 08/19/2012 1655   ALKPHOS 77 02/09/2010 1156   AST 11 01/29/2013 0903   AST 12 08/19/2012 1655   AST 25 02/09/2010 1156   ALT 6 01/29/2013 0903   ALT 9 08/19/2012 1655   ALT 24 02/09/2010 1156   BILITOT 0.36 01/29/2013 0903   BILITOT 0.6 08/19/2012 1655   BILITOT 0.70 02/09/2010 1156       ASSESSMENT: 79 year old gentleman with   #1 history of stage II colon carcinoma he is in complete remission.  #2 metastatic prostate cancer being followed by urology  #3 anemia of chronic disease/renal insufficiency on Aranesp injections  PLAN:  #1 patient will proceed with Aranesp injection today.  #2 he will continue to be seen every month for CBC and Aranesp injections as needed.   #3 We will see him back in 6 months for an appointment.   All questions were answered. The patient knows to call the clinic with any problems, questions or concerns. We can certainly see the patient much sooner if necessary.  I spent 25 minutes counseling the patient face to face. The total time spent in the appointment was 30 minutes.  Drue Second, MD Medical/Oncology [facility] [phone] (beeper) [phone] (Office)  03/17/2013, 4:15 PM

## 2013-03-18 ENCOUNTER — Other Ambulatory Visit: Payer: Self-pay | Admitting: Cardiology

## 2013-03-26 ENCOUNTER — Other Ambulatory Visit (HOSPITAL_BASED_OUTPATIENT_CLINIC_OR_DEPARTMENT_OTHER): Payer: Medicare Other | Admitting: Lab

## 2013-03-26 ENCOUNTER — Other Ambulatory Visit: Payer: Self-pay

## 2013-03-26 ENCOUNTER — Ambulatory Visit (HOSPITAL_BASED_OUTPATIENT_CLINIC_OR_DEPARTMENT_OTHER): Payer: Medicare Other

## 2013-03-26 VITALS — BP 134/68 | HR 92 | Temp 97.9°F | Resp 18

## 2013-03-26 DIAGNOSIS — N189 Chronic kidney disease, unspecified: Secondary | ICD-10-CM

## 2013-03-26 DIAGNOSIS — D631 Anemia in chronic kidney disease: Secondary | ICD-10-CM

## 2013-03-26 DIAGNOSIS — C61 Malignant neoplasm of prostate: Secondary | ICD-10-CM

## 2013-03-26 LAB — COMPREHENSIVE METABOLIC PANEL (CC13)
ALT: 7 U/L (ref 0–55)
Anion Gap: 14 mEq/L — ABNORMAL HIGH (ref 3–11)
BUN: 50.8 mg/dL — ABNORMAL HIGH (ref 7.0–26.0)
CO2: 16 mEq/L — ABNORMAL LOW (ref 22–29)
Calcium: 6.5 mg/dL — ABNORMAL LOW (ref 8.4–10.4)
Chloride: 110 mEq/L — ABNORMAL HIGH (ref 98–109)
Creatinine: 3.9 mg/dL (ref 0.7–1.3)
Sodium: 139 mEq/L (ref 136–145)
Total Protein: 7 g/dL (ref 6.4–8.3)

## 2013-03-26 LAB — CBC WITH DIFFERENTIAL/PLATELET
BASO%: 1.1 % (ref 0.0–2.0)
Basophils Absolute: 0.1 10*3/uL (ref 0.0–0.1)
HCT: 31.1 % — ABNORMAL LOW (ref 38.4–49.9)
HGB: 10.5 g/dL — ABNORMAL LOW (ref 13.0–17.1)
MCHC: 33.6 g/dL (ref 32.0–36.0)
MONO#: 0.7 10*3/uL (ref 0.1–0.9)
NEUT#: 6.6 10*3/uL — ABNORMAL HIGH (ref 1.5–6.5)
NEUT%: 79.5 % — ABNORMAL HIGH (ref 39.0–75.0)
RBC: 3.4 10*6/uL — ABNORMAL LOW (ref 4.20–5.82)
WBC: 8.3 10*3/uL (ref 4.0–10.3)
lymph#: 0.7 10*3/uL — ABNORMAL LOW (ref 0.9–3.3)

## 2013-03-26 LAB — IRON AND TIBC CHCC: %SAT: 42 % (ref 20–55)

## 2013-03-26 LAB — FERRITIN CHCC: Ferritin: 324 ng/ml — ABNORMAL HIGH (ref 22–316)

## 2013-03-26 MED ORDER — DARBEPOETIN ALFA-POLYSORBATE 500 MCG/ML IJ SOLN
300.0000 ug | Freq: Once | INTRAMUSCULAR | Status: AC
  Administered 2013-03-26: 300 ug via SUBCUTANEOUS
  Filled 2013-03-26: qty 1

## 2013-04-23 ENCOUNTER — Other Ambulatory Visit (HOSPITAL_BASED_OUTPATIENT_CLINIC_OR_DEPARTMENT_OTHER): Payer: Medicare Other

## 2013-04-23 ENCOUNTER — Ambulatory Visit (HOSPITAL_BASED_OUTPATIENT_CLINIC_OR_DEPARTMENT_OTHER): Payer: Medicare Other

## 2013-04-23 VITALS — BP 127/63 | HR 86 | Temp 98.5°F

## 2013-04-23 DIAGNOSIS — D631 Anemia in chronic kidney disease: Secondary | ICD-10-CM

## 2013-04-23 DIAGNOSIS — N189 Chronic kidney disease, unspecified: Secondary | ICD-10-CM

## 2013-04-23 LAB — CBC WITH DIFFERENTIAL/PLATELET
Basophils Absolute: 0 10*3/uL (ref 0.0–0.1)
EOS%: 3 % (ref 0.0–7.0)
Eosinophils Absolute: 0.3 10*3/uL (ref 0.0–0.5)
LYMPH%: 12.5 % — ABNORMAL LOW (ref 14.0–49.0)
MCH: 30.7 pg (ref 27.2–33.4)
MCHC: 33 g/dL (ref 32.0–36.0)
MCV: 93 fL (ref 79.3–98.0)
MONO%: 6.8 % (ref 0.0–14.0)
NEUT#: 6.6 10*3/uL — ABNORMAL HIGH (ref 1.5–6.5)
RBC: 3.55 10*6/uL — ABNORMAL LOW (ref 4.20–5.82)
RDW: 16.2 % — ABNORMAL HIGH (ref 11.0–14.6)
lymph#: 1.1 10*3/uL (ref 0.9–3.3)

## 2013-04-23 IMAGING — CR DG CHEST 2V
2 series · 2 of 2 positions shown · non-contrast
Comparison: 08/19/2012

CLINICAL DATA: Shortness of breath.  Weakness and abdominal pain.

CHEST - 2 VIEW

[w chest lat]
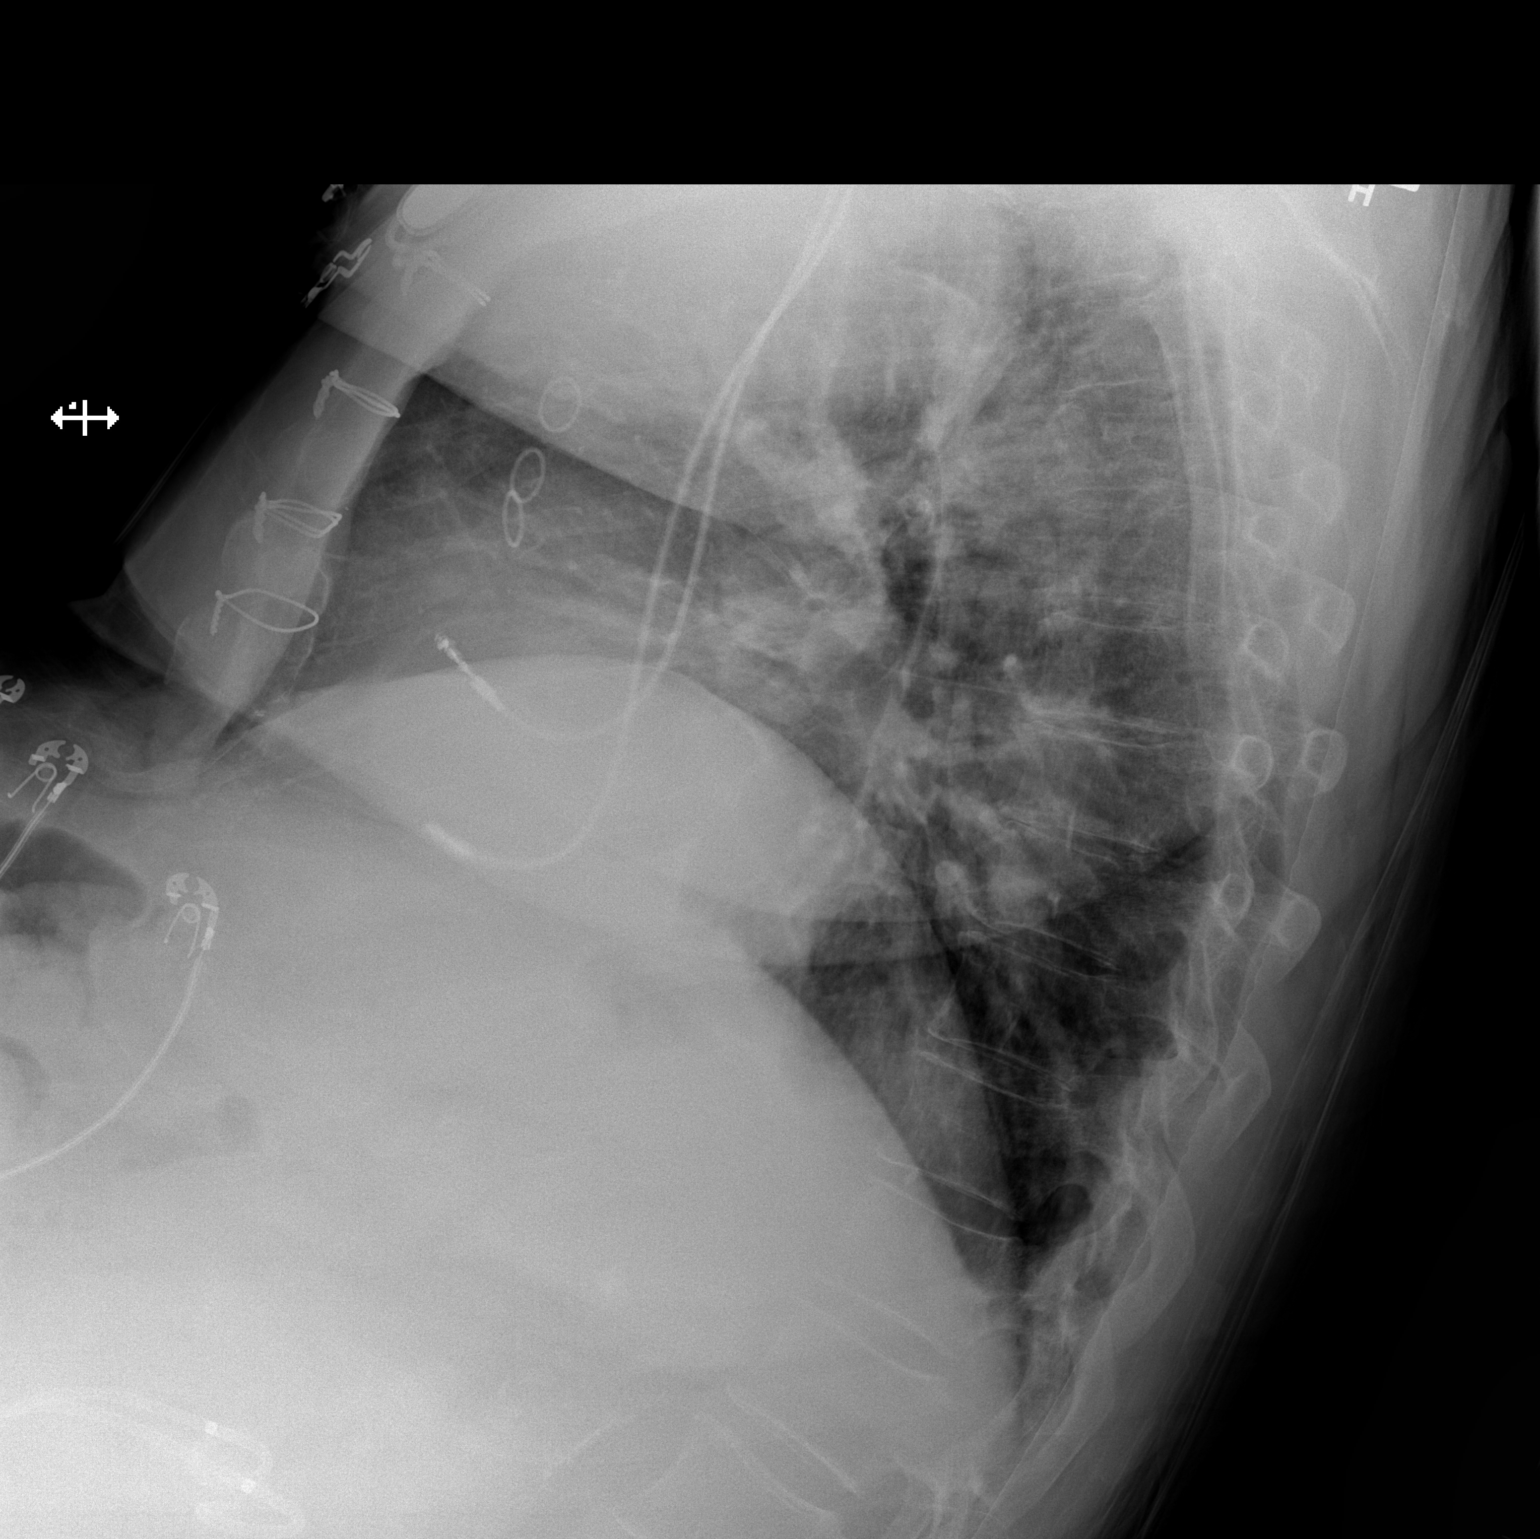

[x chest ap]
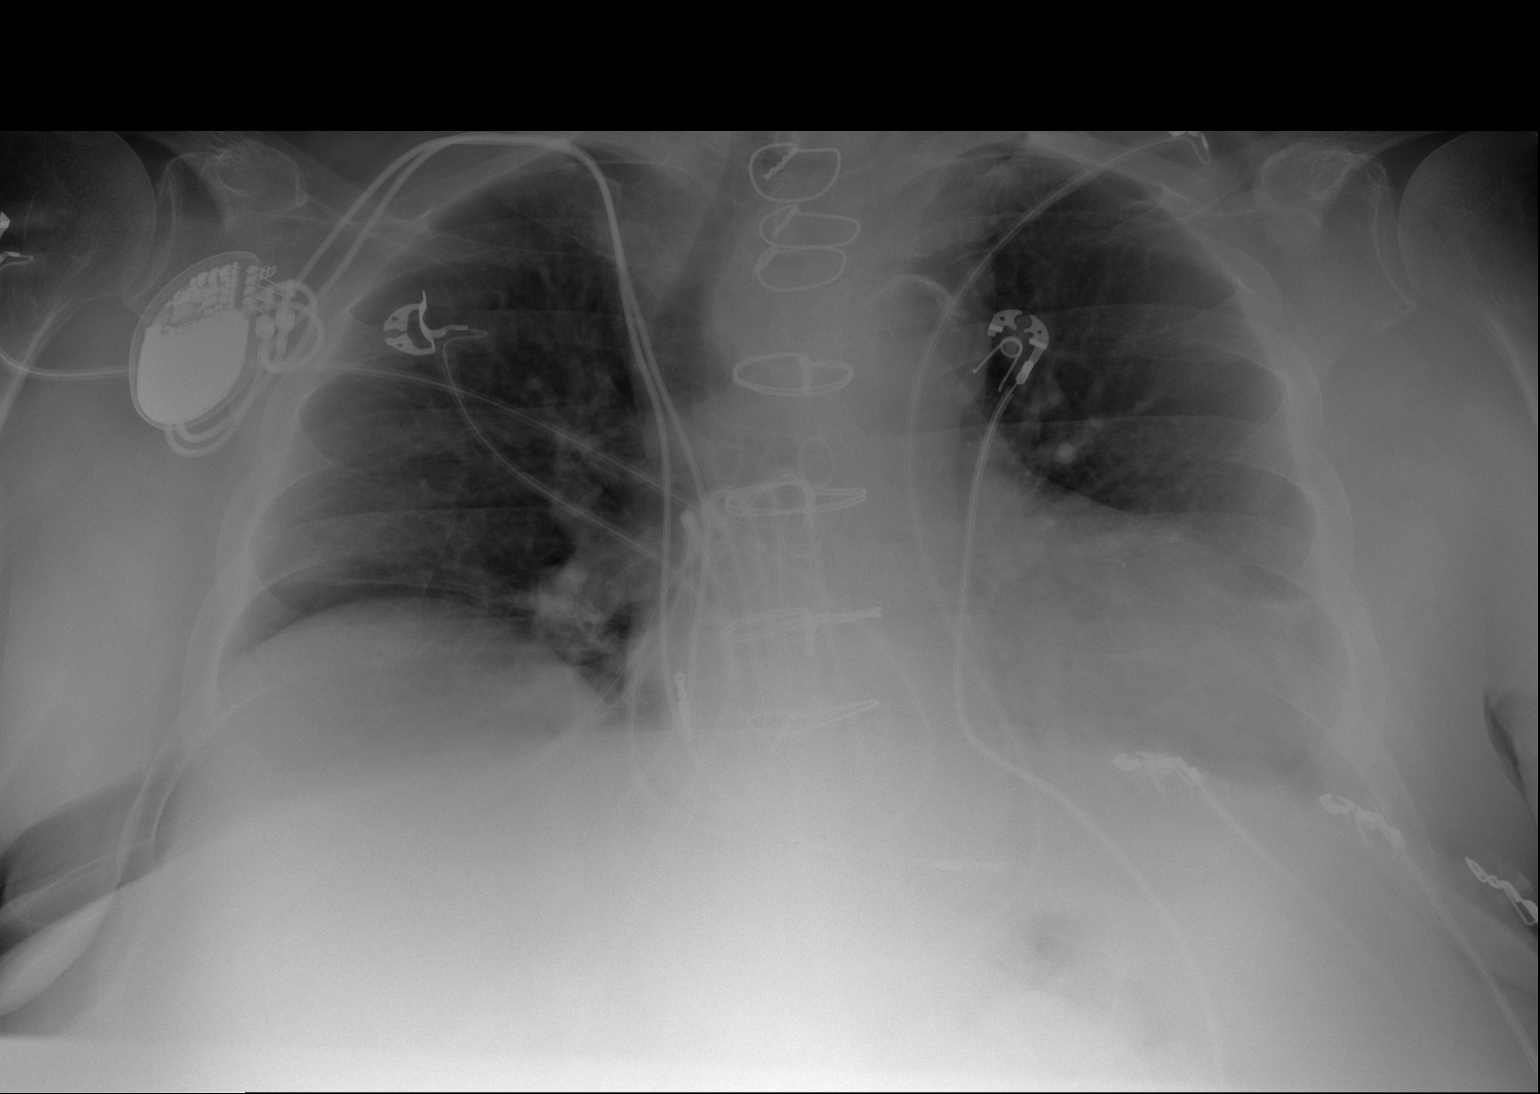

[2 of 2 positions shown; findings below may reference images not displayed]

FINDINGS: The heart size is enlarged.  The patient is status post
median sternotomy and CABG procedure.  There is a right chest wall
pacer device with lead in the right atrial appendage and right
ventricle.  Lung volumes are low and there is asymmetric elevation
of the right hemidiaphragm.  No pleural effusion or edema noted.
No airspace consolidation.
IMPRESSION: 1.  Cardiac enlargement.
2.  Low lung volumes with asymmetric elevation of the right
hemidiaphragm.

## 2013-04-23 MED ORDER — DARBEPOETIN ALFA-POLYSORBATE 500 MCG/ML IJ SOLN
300.0000 ug | Freq: Once | INTRAMUSCULAR | Status: AC
  Administered 2013-04-23: 300 ug via SUBCUTANEOUS
  Filled 2013-04-23: qty 1

## 2013-05-04 ENCOUNTER — Ambulatory Visit (INDEPENDENT_AMBULATORY_CARE_PROVIDER_SITE_OTHER): Payer: Medicare Other | Admitting: *Deleted

## 2013-05-04 ENCOUNTER — Encounter: Payer: Self-pay | Admitting: Internal Medicine

## 2013-05-04 DIAGNOSIS — I495 Sick sinus syndrome: Secondary | ICD-10-CM

## 2013-05-04 DIAGNOSIS — Z950 Presence of cardiac pacemaker: Secondary | ICD-10-CM

## 2013-05-08 LAB — MDC_IDC_ENUM_SESS_TYPE_REMOTE
Battery Impedance: 2893 Ohm
Brady Statistic AP VS Percent: 2 %
Brady Statistic AS VP Percent: 0 %
Brady Statistic AS VS Percent: 25 %
Date Time Interrogation Session: 20141215165105
Lead Channel Impedance Value: 329 Ohm
Lead Channel Impedance Value: 348 Ohm
Lead Channel Sensing Intrinsic Amplitude: 16 mV
Lead Channel Setting Pacing Amplitude: 2.5 V
Lead Channel Setting Pacing Pulse Width: 0.4 ms
Lead Channel Setting Sensing Sensitivity: 4 mV

## 2013-05-20 ENCOUNTER — Other Ambulatory Visit: Payer: Self-pay | Admitting: Emergency Medicine

## 2013-05-20 DIAGNOSIS — N189 Chronic kidney disease, unspecified: Secondary | ICD-10-CM

## 2013-05-20 DIAGNOSIS — C61 Malignant neoplasm of prostate: Secondary | ICD-10-CM

## 2013-05-22 ENCOUNTER — Other Ambulatory Visit (HOSPITAL_BASED_OUTPATIENT_CLINIC_OR_DEPARTMENT_OTHER): Payer: Medicare Other

## 2013-05-22 ENCOUNTER — Ambulatory Visit: Payer: Medicare Other

## 2013-05-22 DIAGNOSIS — C61 Malignant neoplasm of prostate: Secondary | ICD-10-CM

## 2013-05-22 DIAGNOSIS — D631 Anemia in chronic kidney disease: Principal | ICD-10-CM

## 2013-05-22 DIAGNOSIS — N039 Chronic nephritic syndrome with unspecified morphologic changes: Secondary | ICD-10-CM

## 2013-05-22 DIAGNOSIS — N189 Chronic kidney disease, unspecified: Secondary | ICD-10-CM

## 2013-05-22 LAB — CBC WITH DIFFERENTIAL/PLATELET
BASO%: 0.2 % (ref 0.0–2.0)
BASOS ABS: 0 10*3/uL (ref 0.0–0.1)
EOS%: 2.6 % (ref 0.0–7.0)
Eosinophils Absolute: 0.2 10*3/uL (ref 0.0–0.5)
HEMATOCRIT: 35.4 % — AB (ref 38.4–49.9)
HGB: 11.8 g/dL — ABNORMAL LOW (ref 13.0–17.1)
LYMPH%: 11.7 % — ABNORMAL LOW (ref 14.0–49.0)
MCH: 31.6 pg (ref 27.2–33.4)
MCHC: 33.3 g/dL (ref 32.0–36.0)
MCV: 94.9 fL (ref 79.3–98.0)
MONO#: 1 10*3/uL — AB (ref 0.1–0.9)
MONO%: 10.9 % (ref 0.0–14.0)
NEUT#: 6.5 10*3/uL (ref 1.5–6.5)
NEUT%: 74.6 % (ref 39.0–75.0)
Platelets: 164 10*3/uL (ref 140–400)
RBC: 3.73 10*6/uL — ABNORMAL LOW (ref 4.20–5.82)
RDW: 15.9 % — ABNORMAL HIGH (ref 11.0–14.6)
WBC: 8.7 10*3/uL (ref 4.0–10.3)
lymph#: 1 10*3/uL (ref 0.9–3.3)
nRBC: 0 % (ref 0–0)

## 2013-05-22 MED ORDER — DARBEPOETIN ALFA-POLYSORBATE 500 MCG/ML IJ SOLN: 300.0000 ug | Freq: Once | INTRAMUSCULAR | Status: DC

## 2013-05-28 ENCOUNTER — Encounter: Payer: Self-pay | Admitting: *Deleted

## 2013-06-17 ENCOUNTER — Other Ambulatory Visit: Payer: Self-pay | Admitting: Cardiology

## 2013-06-17 ENCOUNTER — Other Ambulatory Visit: Payer: Self-pay | Admitting: Emergency Medicine

## 2013-06-17 DIAGNOSIS — D631 Anemia in chronic kidney disease: Principal | ICD-10-CM

## 2013-06-17 DIAGNOSIS — N189 Chronic kidney disease, unspecified: Secondary | ICD-10-CM

## 2013-06-18 ENCOUNTER — Other Ambulatory Visit: Payer: Medicare Other

## 2013-06-18 ENCOUNTER — Ambulatory Visit: Payer: Medicare Other

## 2013-06-18 ENCOUNTER — Telehealth: Payer: Self-pay | Admitting: Oncology

## 2013-06-18 NOTE — Telephone Encounter (Signed)
, °

## 2013-06-24 ENCOUNTER — Encounter: Payer: Self-pay | Admitting: Cardiology

## 2013-06-24 ENCOUNTER — Ambulatory Visit (INDEPENDENT_AMBULATORY_CARE_PROVIDER_SITE_OTHER): Payer: Medicare Other | Admitting: Cardiology

## 2013-06-24 VITALS — BP 136/61 | HR 75 | Ht 68.0 in | Wt 228.0 lb

## 2013-06-24 DIAGNOSIS — I480 Paroxysmal atrial fibrillation: Secondary | ICD-10-CM

## 2013-06-24 DIAGNOSIS — I219 Acute myocardial infarction, unspecified: Secondary | ICD-10-CM

## 2013-06-24 DIAGNOSIS — I5022 Chronic systolic (congestive) heart failure: Secondary | ICD-10-CM

## 2013-06-24 DIAGNOSIS — I509 Heart failure, unspecified: Secondary | ICD-10-CM

## 2013-06-24 DIAGNOSIS — I4891 Unspecified atrial fibrillation: Secondary | ICD-10-CM

## 2013-06-24 NOTE — Assessment & Plan Note (Signed)
The patient is not having any symptoms of CHF and no ankle edema.  His creatinine is rising.  We will reduce his furosemide from 40 mg daily down to 20 mg daily

## 2013-06-24 NOTE — Assessment & Plan Note (Signed)
The patient had one episode of atrial fibrillation in December lasting less than 12 hours.  Otherwise no atrial fibrillation.. he is not on chronic anticoagulation because of previous bleeding problems and chronic anemia.

## 2013-06-24 NOTE — Progress Notes (Signed)
[name] Date of Birth:  1928/10/24 [place], [place]  [address] [phone]         Fax   [phone]  History of Present Illness: This pleasant 80 year old gentleman is seen for a scheduled followup office visit. He has a complex past medical history. He has known ischemic heart disease. He is diabetic. He has a history of tachybradycardia syndrome and paroxysmal atrial fibrillation. He has a pacemaker. He has a malignant neoplasm of the prostate with metastases and recently underwent orchiectomy at [facility] on January 18, 2012. However his PSA is still rising slowly and most recently he was hospitalized and on a nuclear bone scan was found to have a metastatic prostate cancer to his left sixth rib.  The patient is now on Xtandi for his metastatic prostate cancer. The patient denies any recent chest pain or angina. His weight has been stable. He has not been having any significant peripheral edema.  Since last visit he has been having no new cardiac symptoms.  He has chronic renal insufficiency and is followed by nephrology.   Current Outpatient Prescriptions  Medication Sig Dispense Refill  . acetaminophen (TYLENOL) 500 MG tablet Take 500 mg by mouth every 6 (six) hours as needed for pain or fever.      Marland Kitchen amiodarone (PACERONE) 200 MG tablet Take 0.5 tablets (100 mg total) by mouth every morning.  30 tablet  3  . amLODipine (NORVASC) 5 MG tablet Take 5 mg by mouth every morning.      . Aromatic Inhalants (VICKS VAPOR INHALER IN) Inhale 1 puff into the lungs daily as needed (for dry air).      Marland Kitchen aspirin EC 81 MG tablet Take 81 mg by mouth every evening.       . Cholecalciferol (VITAMIN D) 2000 UNITS tablet Take 2,000 Units by mouth daily at 12 noon.       . Darbepoetin Alfa-Albumin (ARANESP IJ) Inject 1 Syringe as directed See admin instructions. Every 4 weeks as needed for anemia if hemoglobin is below 11      . enzalutamide (XTANDI) 40 MG  capsule Take 160 mg by mouth 4 (four) times daily.       . finasteride (PROSCAR) 5 MG tablet Take 5 mg by mouth every evening.       . furosemide (LASIX) 40 MG tablet Take 40 mg by mouth as directed. 1/2 TABLET DAILY      . insulin glargine (LANTUS) 100 UNIT/ML injection 10 units daily or as directed  15 mL  11  . metoprolol (LOPRESSOR) 50 MG tablet TAKE 1 TABLET TWICE DAILY.  180 tablet  0  . mirabegron ER (MYRBETRIQ) 25 MG TB24 Take 50 mg by mouth every evening.       Marland Kitchen omeprazole (PRILOSEC) 20 MG capsule Take 1 capsule (20 mg total) by mouth every evening.  90 capsule  3  . rosuvastatin (CRESTOR) 10 MG tablet Take 5 mg by mouth every evening.       . Tamsulosin HCl (FLOMAX) 0.4 MG CAPS Take 0.4 mg by mouth daily after supper.       . ursodiol (ACTIGALL) 300 MG capsule Take 300 mg by mouth 2 (two) times daily.       . vitamin C (ASCORBIC ACID) 500 MG tablet Take 500 mg by mouth daily.       No current facility-administered medications for this visit.    Allergies  Allergen Reactions  .  Adhesive [Tape] Rash    Rash and itching. USE PAPER TAPE ONLY  . Latex Hives  . Lovenox [Enoxaparin Sodium]     Gi bleed  . Lupron [Leuprolide Acetate]     Kidney failure  . Warfarin And Related     Gi bleed    Patient Active Problem List   Diagnosis Date Noted  . BRADYCARDIA-TACHYCARDIA SYNDROME 07/26/2010    Priority: High  . PROSTATE CANCER 07/26/2010    Priority: Medium  . CAD 07/26/2010    Priority: Medium  . Hyponatremia 08/20/2012  . Sepsis secondary to urinary tract infection, complicated 37/02/6268  . Stage III chronic kidney disease 08/19/2012  . Normocytic anemia 08/19/2012  . Pulmonary nodule, right middle lobe 08/19/2012  . Pacemaker 06/16/2012  . Chronic systolic congestive heart failure 06/16/2012  . UTI (lower urinary tract infection) 11/13/2011  . Paroxysmal a-fib 11/09/2011  . Diabetes mellitus 11/07/2011  . HTN (hypertension) 11/07/2011  . Anemia associated with  chronic renal failure 04/05/2011  . ADENOMATOUS COLONIC POLYP 07/26/2010  . HYPERLIPIDEMIA 07/26/2010  . MYOCARDIAL INFARCTION 07/26/2010    History  Smoking status  . Former Smoker -- 10 years  . Types: Cigarettes  . Quit date: 05/22/1963  Smokeless tobacco  . Never Used    History  Alcohol Use No    Family History  Problem Relation Age of Onset  . Heart failure Father 20  . Gallbladder disease Father     Review of Systems: Constitutional: no fever chills diaphoresis or fatigue or change in weight.  Head and neck: no hearing loss, no epistaxis, no photophobia or visual disturbance. Respiratory: No cough, shortness of breath or wheezing. Cardiovascular: No chest pain peripheral edema, palpitations. Gastrointestinal: No abdominal distention, no abdominal pain, no change in bowel habits hematochezia or melena. Genitourinary: No dysuria, no frequency, no urgency, no nocturia. Musculoskeletal:No arthralgias, no back pain, no gait disturbance or myalgias. Neurological: No dizziness, no headaches, no numbness, no seizures, no syncope, no weakness, no tremors. Hematologic: No lymphadenopathy, no easy bruising. Psychiatric: No confusion, no hallucinations, no sleep disturbance.    Physical Exam: Filed Vitals:   06/24/13 1459  BP: 136/61  Pulse: 75   the general appearance reveals a moderately obese gentleman in no distress.  His weight is up 4 pounds since last visit and he had a recent birthday and celebrated by eating sauerbraten which is his custom.The head and neck exam reveals pupils equal and reactive.  Extraocular movements are full.  There is no scleral icterus.  The mouth and pharynx are normal.  The neck is supple.  The carotids reveal no bruits.  The jugular venous pressure is normal.  The  thyroid is not enlarged.  There is no lymphadenopathy.  The chest is clear to percussion and auscultation.  There are no rales or rhonchi.  Expansion of the chest is symmetrical.   The precordium is quiet.  The first heart sound is normal.  The second heart sound is physiologically split.  There is no murmur gallop rub or click.  There is no abnormal lift or heave.  The abdomen is soft and nontender.  The bowel sounds are normal.  The liver and spleen are not enlarged.  There are no abdominal masses.  There are no abdominal bruits.  Extremities reveal good pedal pulses.  There is no phlebitis or edema.  There is no cyanosis or clubbing.  Strength is normal and symmetrical in all extremities.  There is no lateralizing weakness.  There are  no sensory deficits.  The skin is warm and dry.  There is no rash.     Assessment / Plan: Overall patient is stable from cardiac standpoint.  Reduce Lasix to just 20 mg daily.  Continue to follow up closely with nephrology.  Recheck here in 3 months for office visit and EKG

## 2013-06-24 NOTE — Patient Instructions (Signed)
DECREASE LASIX (FUROSEMIDE) TO 20 MG DAILY  Your physician wants you to follow-up in: [place] will receive a reminder letter in the mail two months in advance. If you don't receive a letter, please call our office to schedule the follow-up appointment.

## 2013-06-24 NOTE — Assessment & Plan Note (Signed)
The patient is not having any recurrent chest pain or angina

## 2013-06-25 ENCOUNTER — Other Ambulatory Visit: Payer: Medicare Other

## 2013-06-25 ENCOUNTER — Ambulatory Visit (HOSPITAL_BASED_OUTPATIENT_CLINIC_OR_DEPARTMENT_OTHER): Payer: Medicare Other

## 2013-06-25 VITALS — BP 129/64 | HR 86 | Temp 97.7°F

## 2013-06-25 DIAGNOSIS — N039 Chronic nephritic syndrome with unspecified morphologic changes: Secondary | ICD-10-CM

## 2013-06-25 DIAGNOSIS — D631 Anemia in chronic kidney disease: Secondary | ICD-10-CM

## 2013-06-25 DIAGNOSIS — N189 Chronic kidney disease, unspecified: Secondary | ICD-10-CM

## 2013-06-25 MED ORDER — DARBEPOETIN ALFA-POLYSORBATE 300 MCG/0.6ML IJ SOLN
300.0000 ug | Freq: Once | INTRAMUSCULAR | Status: AC
  Administered 2013-06-25: 300 ug via SUBCUTANEOUS
  Filled 2013-06-25: qty 0.6

## 2013-07-16 ENCOUNTER — Other Ambulatory Visit: Payer: Medicare Other

## 2013-07-16 ENCOUNTER — Ambulatory Visit: Payer: Medicare Other

## 2013-07-23 ENCOUNTER — Ambulatory Visit: Payer: Medicare Other

## 2013-07-23 ENCOUNTER — Other Ambulatory Visit (HOSPITAL_BASED_OUTPATIENT_CLINIC_OR_DEPARTMENT_OTHER): Payer: Medicare Other

## 2013-07-23 DIAGNOSIS — N189 Chronic kidney disease, unspecified: Secondary | ICD-10-CM

## 2013-07-23 DIAGNOSIS — D631 Anemia in chronic kidney disease: Principal | ICD-10-CM

## 2013-07-23 DIAGNOSIS — N039 Chronic nephritic syndrome with unspecified morphologic changes: Secondary | ICD-10-CM

## 2013-07-23 LAB — CBC WITH DIFFERENTIAL/PLATELET
BASO%: 0.5 % (ref 0.0–2.0)
BASOS ABS: 0 10*3/uL (ref 0.0–0.1)
EOS%: 3.2 % (ref 0.0–7.0)
Eosinophils Absolute: 0.2 10*3/uL (ref 0.0–0.5)
HEMATOCRIT: 32.8 % — AB (ref 38.4–49.9)
HGB: 11 g/dL — ABNORMAL LOW (ref 13.0–17.1)
LYMPH%: 10 % — ABNORMAL LOW (ref 14.0–49.0)
MCH: 33 pg (ref 27.2–33.4)
MCHC: 33.5 g/dL (ref 32.0–36.0)
MCV: 98.5 fL — AB (ref 79.3–98.0)
MONO#: 0.6 10*3/uL (ref 0.1–0.9)
MONO%: 8 % (ref 0.0–14.0)
NEUT#: 5.9 10*3/uL (ref 1.5–6.5)
NEUT%: 78.3 % — AB (ref 39.0–75.0)
Platelets: 158 10*3/uL (ref 140–400)
RBC: 3.33 10*6/uL — ABNORMAL LOW (ref 4.20–5.82)
RDW: 14.6 % (ref 11.0–14.6)
WBC: 7.6 10*3/uL (ref 4.0–10.3)
lymph#: 0.8 10*3/uL — ABNORMAL LOW (ref 0.9–3.3)

## 2013-07-23 MED ORDER — DARBEPOETIN ALFA-POLYSORBATE 500 MCG/ML IJ SOLN: 300.0000 ug | Freq: Once | INTRAMUSCULAR | Status: DC

## 2013-08-05 ENCOUNTER — Encounter: Payer: Medicare Other | Admitting: *Deleted

## 2013-08-06 ENCOUNTER — Ambulatory Visit: Payer: Medicare Other | Admitting: Adult Health

## 2013-08-06 ENCOUNTER — Other Ambulatory Visit: Payer: Medicare Other

## 2013-08-06 ENCOUNTER — Ambulatory Visit: Payer: Medicare Other

## 2013-08-10 ENCOUNTER — Encounter: Payer: Self-pay | Admitting: *Deleted

## 2013-08-20 ENCOUNTER — Ambulatory Visit (HOSPITAL_BASED_OUTPATIENT_CLINIC_OR_DEPARTMENT_OTHER): Payer: Medicare Other

## 2013-08-20 ENCOUNTER — Telehealth: Payer: Self-pay | Admitting: Oncology

## 2013-08-20 ENCOUNTER — Other Ambulatory Visit (HOSPITAL_BASED_OUTPATIENT_CLINIC_OR_DEPARTMENT_OTHER): Payer: Medicare Other

## 2013-08-20 ENCOUNTER — Encounter: Payer: Medicare Other | Admitting: Adult Health

## 2013-08-20 VITALS — BP 131/69 | HR 65 | Temp 98.2°F

## 2013-08-20 DIAGNOSIS — D631 Anemia in chronic kidney disease: Secondary | ICD-10-CM

## 2013-08-20 DIAGNOSIS — N039 Chronic nephritic syndrome with unspecified morphologic changes: Secondary | ICD-10-CM

## 2013-08-20 DIAGNOSIS — N189 Chronic kidney disease, unspecified: Secondary | ICD-10-CM

## 2013-08-20 DIAGNOSIS — C61 Malignant neoplasm of prostate: Secondary | ICD-10-CM

## 2013-08-20 LAB — CBC WITH DIFFERENTIAL/PLATELET
BASO%: 0.5 % (ref 0.0–2.0)
Basophils Absolute: 0 10*3/uL (ref 0.0–0.1)
EOS%: 2.2 % (ref 0.0–7.0)
Eosinophils Absolute: 0.2 10*3/uL (ref 0.0–0.5)
HCT: 31.1 % — ABNORMAL LOW (ref 38.4–49.9)
HGB: 10.6 g/dL — ABNORMAL LOW (ref 13.0–17.1)
LYMPH#: 0.8 10*3/uL — AB (ref 0.9–3.3)
LYMPH%: 9.4 % — ABNORMAL LOW (ref 14.0–49.0)
MCH: 33.1 pg (ref 27.2–33.4)
MCHC: 34.1 g/dL (ref 32.0–36.0)
MCV: 97.2 fL (ref 79.3–98.0)
MONO#: 0.7 10*3/uL (ref 0.1–0.9)
MONO%: 8.1 % (ref 0.0–14.0)
NEUT#: 7 10*3/uL — ABNORMAL HIGH (ref 1.5–6.5)
NEUT%: 79.8 % — AB (ref 39.0–75.0)
Platelets: 178 10*3/uL (ref 140–400)
RBC: 3.2 10*6/uL — AB (ref 4.20–5.82)
RDW: 13.6 % (ref 11.0–14.6)
WBC: 8.8 10*3/uL (ref 4.0–10.3)
nRBC: 0 % (ref 0–0)

## 2013-08-20 MED ORDER — DARBEPOETIN ALFA-POLYSORBATE 300 MCG/0.6ML IJ SOLN
300.0000 ug | Freq: Once | INTRAMUSCULAR | Status: AC
  Administered 2013-08-20: 300 ug via SUBCUTANEOUS
  Filled 2013-08-20: qty 0.6

## 2013-08-20 NOTE — Telephone Encounter (Signed)
S/w the pt's son and he is aware of the may appts for lab,md and injection.

## 2013-08-23 NOTE — Progress Notes (Signed)
This encounter was created in error - please disregard.

## 2013-09-17 ENCOUNTER — Other Ambulatory Visit: Payer: Self-pay | Admitting: *Deleted

## 2013-09-17 DIAGNOSIS — C61 Malignant neoplasm of prostate: Secondary | ICD-10-CM

## 2013-09-18 ENCOUNTER — Ambulatory Visit: Payer: Medicare Other

## 2013-09-18 ENCOUNTER — Other Ambulatory Visit: Payer: Medicare Other

## 2013-09-18 ENCOUNTER — Ambulatory Visit: Payer: Medicare Other | Admitting: Adult Health

## 2013-09-20 ENCOUNTER — Other Ambulatory Visit: Payer: Self-pay | Admitting: Cardiology

## 2013-10-01 ENCOUNTER — Telehealth: Payer: Self-pay | Admitting: Hematology and Oncology

## 2013-10-01 NOTE — Telephone Encounter (Signed)
pt son racer quam called yesterday [phone]) and stated dad is a pt of Alicia and they had to cx last appts due to the death of his mom. son req that pt be scheduled w/NG who took care of his mom. s/w NG and she will see pt 5/21 @ 11:30am w/lb. s./w son Jeneen Rinks this morning and he is aware of new d/t for lb/NG 5/21 @ 11am. james aware that inj attached to last appt was not r/s. once NG sees pt if inj is needed she will order.

## 2013-10-08 ENCOUNTER — Ambulatory Visit (HOSPITAL_BASED_OUTPATIENT_CLINIC_OR_DEPARTMENT_OTHER): Payer: Medicare Other | Admitting: Hematology and Oncology

## 2013-10-08 ENCOUNTER — Encounter: Payer: Self-pay | Admitting: Hematology and Oncology

## 2013-10-08 ENCOUNTER — Telehealth: Payer: Self-pay | Admitting: Hematology and Oncology

## 2013-10-08 ENCOUNTER — Other Ambulatory Visit (HOSPITAL_BASED_OUTPATIENT_CLINIC_OR_DEPARTMENT_OTHER): Payer: Medicare Other

## 2013-10-08 VITALS — BP 136/71 | HR 79 | Temp 97.7°F | Resp 20 | Ht 68.0 in | Wt 216.7 lb

## 2013-10-08 DIAGNOSIS — C61 Malignant neoplasm of prostate: Secondary | ICD-10-CM

## 2013-10-08 DIAGNOSIS — D631 Anemia in chronic kidney disease: Secondary | ICD-10-CM

## 2013-10-08 DIAGNOSIS — C7951 Secondary malignant neoplasm of bone: Secondary | ICD-10-CM

## 2013-10-08 DIAGNOSIS — Z85038 Personal history of other malignant neoplasm of large intestine: Secondary | ICD-10-CM

## 2013-10-08 DIAGNOSIS — C7952 Secondary malignant neoplasm of bone marrow: Secondary | ICD-10-CM

## 2013-10-08 DIAGNOSIS — N189 Chronic kidney disease, unspecified: Secondary | ICD-10-CM

## 2013-10-08 DIAGNOSIS — N039 Chronic nephritic syndrome with unspecified morphologic changes: Secondary | ICD-10-CM

## 2013-10-08 LAB — CBC WITH DIFFERENTIAL/PLATELET
BASO%: 0.3 % (ref 0.0–2.0)
Basophils Absolute: 0 10*3/uL (ref 0.0–0.1)
EOS%: 2.4 % (ref 0.0–7.0)
Eosinophils Absolute: 0.2 10*3/uL (ref 0.0–0.5)
HEMATOCRIT: 32.4 % — AB (ref 38.4–49.9)
HGB: 11 g/dL — ABNORMAL LOW (ref 13.0–17.1)
LYMPH%: 8 % — ABNORMAL LOW (ref 14.0–49.0)
MCH: 33.3 pg (ref 27.2–33.4)
MCHC: 34 g/dL (ref 32.0–36.0)
MCV: 98.2 fL — AB (ref 79.3–98.0)
MONO#: 0.8 10*3/uL (ref 0.1–0.9)
MONO%: 8.6 % (ref 0.0–14.0)
NEUT#: 7.4 10*3/uL — ABNORMAL HIGH (ref 1.5–6.5)
NEUT%: 80.7 % — AB (ref 39.0–75.0)
PLATELETS: 175 10*3/uL (ref 140–400)
RBC: 3.3 10*6/uL — ABNORMAL LOW (ref 4.20–5.82)
RDW: 13.3 % (ref 11.0–14.6)
WBC: 9.1 10*3/uL (ref 4.0–10.3)
lymph#: 0.7 10*3/uL — ABNORMAL LOW (ref 0.9–3.3)

## 2013-10-08 LAB — COMPREHENSIVE METABOLIC PANEL (CC13)
ALK PHOS: 88 U/L (ref 40–150)
AST: 10 U/L (ref 5–34)
Albumin: 3.3 g/dL — ABNORMAL LOW (ref 3.5–5.0)
Anion Gap: 14 mEq/L — ABNORMAL HIGH (ref 3–11)
BUN: 43.8 mg/dL — ABNORMAL HIGH (ref 7.0–26.0)
CO2: 21 mEq/L — ABNORMAL LOW (ref 22–29)
Calcium: 9.8 mg/dL (ref 8.4–10.4)
Chloride: 103 mEq/L (ref 98–109)
Creatinine: 2.2 mg/dL — ABNORMAL HIGH (ref 0.7–1.3)
Glucose: 166 mg/dl — ABNORMAL HIGH (ref 70–140)
POTASSIUM: 4.7 meq/L (ref 3.5–5.1)
SODIUM: 137 meq/L (ref 136–145)
TOTAL PROTEIN: 6.9 g/dL (ref 6.4–8.3)
Total Bilirubin: 0.45 mg/dL (ref 0.20–1.20)

## 2013-10-08 NOTE — Progress Notes (Signed)
[facility] FOLLOW-UP progress notes  Patient Care Team: Velna Hatchet, MD as PCP - General (Internal Medicine)  CHIEF COMPLAINTS/PURPOSE OF VISIT:  #1 anemia due to chronic kidney failure, on treatment with Aranesp #2 history of stage II colon cancer, no evidence of recurrence, T3, N0, M0 #3 metastatic prostate cancer to the bone, ongoing treatment with enzalutamide  HISTORY OF PRESENTING ILLNESS:  Jeffery Gibson 80 y.o. male was transferred to my care after his prior physician has left.  I reviewed the patient's records extensive and collaborated the history with the patient. Summary of his history is as follows: This is a very pleasant 80 year old gentleman accompanied by his son.  He has background history of colon cancer, resected in 2005. It was discovered on routine colonoscopy. He never received adjuvant treatment. In 2004, the patient was diagnosed with prostate cancer and was treated with radioactive seed implantation. He was recently was found to have metastatic disease in his bone and is currently undergoing treatment with enzalutamide.  The patient also have anemia of chronic renal failure. He has been receiving erythropoietin stimulating agents to keep his hemoglobin above 11 g.  He feels well today. Denies any complications from his current treatment. He complained of some mild fatigue and nausea but he thought it is attributed to current treatment for his prostate cancer.   MEDICAL HISTORY:  Past Medical History  Diagnosis Date  . Ischemic heart disease 05/06/06    post CARG 05/06/06  . Obese     exogenous  . Renal insufficiency   . Anemia of chronic disease     aranesp injections  . Dyslipidemia   . Coronary artery disease   . Anemia associated with chronic renal failure 04/05/2011  . Cancer     prostate/on Lupron  . Adenocarcinoma of colon 11/2003    stage 2(T3,N0,M0)  . Diabetes mellitus   . Hypertension   . Anginal pain   . Myocardial  infarction 1986  . Pacemaker   . Cataracts, bilateral   . Seizures   . Arthritis   . Hemorrhoids   . GERD (gastroesophageal reflux disease)     hx of  . Hematoma     hx of  . Atrial flutter     afib and atrial flutter (permanent)  . Tachycardia-bradycardia 1997    dual-chamber/for tachybradycardia syndrome  . Sleep apnea     no cpap    SURGICAL HISTORY: Past Surgical History  Procedure Laterality Date  . Laparotomy  12/04/2003    resection of rectosigmoid carcinoma/  . Radioactive seed implant  05/2005    transperianeal placement I-125 for prostate cancer/# of  seeds 55  . Cardiac catheterization  05/01/06    EF 40%/diffuse 3 vessel CAD/tight L antereior descending artery stenosis/diffuse disease proximal L anterior descending  . Pacemaker removal  1997  . Fracture surgery      left ankle  . Insert / replace / remove pacemaker  2006  . Colon surgery  2005  . Coronary artery bypass graft  05/06/06    x4 with L internal mammary artery to the L anterior descending coronary artery  . Laser surgery  2012    prostate  . Orchiectomy  august 2013  . Cystoscopy w/ ureteral stent placement Bilateral 08/04/2012    Procedure: CYSTOSCOPY WITH bilateral retrograde;  Surgeon: Dutch Gray, MD;  Location: [facility];  Service: Urology;  Laterality: Bilateral;  attempted stent placement   . Peripherally inserted central catheter insertion  10-30-12  right upper arm-Antibiotic IV "urinary pseudomonas"  . Cystoscopy w/ ureteral stent placement Bilateral 11/03/2012    Procedure: CYSTOSCOPY WITH STENT REPLACEMENT;  Surgeon: Dutch Gray, MD;  Location: [facility];  Service: Urology;  Laterality: Bilateral;    SOCIAL HISTORY: History   Social History  . Marital Status: Married    Spouse Name: N/A    Number of Children: N/A  . Years of Education: N/A   Occupational History  . Engineer, structural, retired.    Social History Main Topics  . Smoking status: Former Smoker -- 10 years    Types:  Cigarettes    Quit date: 05/22/1963  . Smokeless tobacco: Never Used  . Alcohol Use: No  . Drug Use: No  . Sexual Activity: Not Currently   Other Topics Concern  . Not on file   Social History Narrative   Married.  Ambulates with a cane.    FAMILY HISTORY: Family History  Problem Relation Age of Onset  . Heart failure Father 34  . Gallbladder disease Father   . Cancer Son     glioblastoma    ALLERGIES:  is allergic to adhesive; latex; lovenox; lupron; and warfarin and related.  MEDICATIONS:  Current Outpatient Prescriptions  Medication Sig Dispense Refill  . acetaminophen (TYLENOL) 500 MG tablet Take 500 mg by mouth every 6 (six) hours as needed for pain or fever.      Marland Kitchen amiodarone (PACERONE) 200 MG tablet Take 0.5 tablets (100 mg total) by mouth every morning.  30 tablet  3  . amLODipine (NORVASC) 5 MG tablet Take 5 mg by mouth every morning.      . Aromatic Inhalants (VICKS VAPOR INHALER IN) Inhale 1 puff into the lungs daily as needed (for dry air).      Marland Kitchen aspirin EC 81 MG tablet Take 81 mg by mouth every evening.       . Cholecalciferol (VITAMIN D) 2000 UNITS tablet Take 2,000 Units by mouth daily at 12 noon.       . Darbepoetin Alfa-Albumin (ARANESP IJ) Inject 1 Syringe as directed See admin instructions. Every 4 weeks as needed for anemia if hemoglobin is below 11      . enzalutamide (XTANDI) 40 MG capsule Take 160 mg by mouth 4 (four) times daily.       . finasteride (PROSCAR) 5 MG tablet Take 5 mg by mouth every evening.       . furosemide (LASIX) 40 MG tablet Take 40 mg by mouth as directed. 1/2 TABLET DAILY      . insulin glargine (LANTUS) 100 UNIT/ML injection 10 units daily or as directed  15 mL  11  . metoprolol (LOPRESSOR) 50 MG tablet TAKE 1 TABLET TWICE DAILY.  180 tablet  1  . mirabegron ER (MYRBETRIQ) 25 MG TB24 Take 50 mg by mouth every evening.       Marland Kitchen omeprazole (PRILOSEC) 20 MG capsule TAKE 1 CAPSULE DAILY.  90 capsule  1  . rosuvastatin (CRESTOR) 10 MG  tablet Take 5 mg by mouth every evening.       . Tamsulosin HCl (FLOMAX) 0.4 MG CAPS Take 0.4 mg by mouth daily after supper.       . ursodiol (ACTIGALL) 300 MG capsule Take 300 mg by mouth 2 (two) times daily.       . vitamin C (ASCORBIC ACID) 500 MG tablet Take 500 mg by mouth daily.       No current facility-administered medications for this visit.  REVIEW OF SYSTEMS:   Constitutional: Denies fevers, chills or abnormal night sweats Eyes: Denies blurriness of vision, double vision or watery eyes Ears, nose, mouth, throat, and face: Denies mucositis or sore throat Respiratory: Denies cough, dyspnea or wheezes Cardiovascular: Denies palpitation, chest discomfort or lower extremity swelling Skin: Denies abnormal skin rashes Lymphatics: Denies new lymphadenopathy or easy bruising Neurological:Denies numbness, tingling or new weaknesses Behavioral/Psych: Mood is stable, no new changes  All other systems were reviewed with the patient and are negative.  PHYSICAL EXAMINATION: ECOG PERFORMANCE STATUS: 1 - Symptomatic but completely ambulatory  Filed Vitals:   10/08/13 1137  BP: 136/71  Pulse: 79  Temp: 97.7 F (36.5 C)  Resp: 20   Filed Weights   10/08/13 1137  Weight: 216 lb 11.2 oz (98.294 kg)    GENERAL:alert, no distress and comfortable. He is mildly obese SKIN: skin color, texture, turgor are normal, no rashes or significant lesions EYES: normal, conjunctiva are pink and non-injected, sclera clear OROPHARYNX:no exudate, normal lips, buccal mucosa, and tongue  NECK: supple, thyroid normal size, non-tender, without nodularity LYMPH:  no palpable lymphadenopathy in the cervical, axillary or inguinal LUNGS: clear to auscultation and percussion with normal breathing effort HEART: Soft systolic murmur on the left sternal border. Very mild trace bilateral lower extremity edema. Pacemaker in situ ABDOMEN:abdomen soft, non-tender and normal bowel sounds Musculoskeletal:no  cyanosis of digits and no clubbing  PSYCH: alert & oriented x 3 with fluent speech NEURO: no focal motor/sensory deficits  LABORATORY DATA:  I have reviewed the data as listed Lab Results  Component Value Date   WBC 9.1 10/08/2013   HGB 11.0* 10/08/2013   HCT 32.4* 10/08/2013   MCV 98.2* 10/08/2013   PLT 175 10/08/2013    Recent Labs  10/30/12 1010  01/29/13 0903 03/26/13 0908 10/08/13 1056  NA 133*  < > 140 139 137  K 4.4  < > 4.5 4.4 4.7  CL 96  --   --   --   --   CO2 24  < > 20* 16* 21*  GLUCOSE 214*  < > 224* 174* 166*  BUN 47*  < > 45.5* 50.8* 43.8*  CREATININE 2.41*  < > 3.6* 3.9* 2.2*  CALCIUM 9.3  < > 6.3* 6.5* 9.8  GFRNONAA 24*  --   --   --   --   GFRAA 27*  --   --   --   --   PROT  --   < > 7.2 7.0 6.9  ALBUMIN  --   < > 3.2* 3.1* 3.3*  AST  --   < > 11 11 10   ALT  --   < > 6 7 <6  ALKPHOS  --   < > 75 92 88  BILITOT  --   < > 0.36 0.49 0.45  < > = values in this interval not displayed.  ASSESSMENT & PLAN:  #1 Anemia chronic disease This is likely anemia of chronic disease. The patient denies recent history of bleeding such as epistaxis, hematuria or hematochezia. He is asymptomatic from the anemia. He does not require transfusion now.  We will continue erythropoietin stimulating agents in an attempt to keep hemoglobin greater than 11 g. I will set up appointment every 4 weeks #2 chronic renal failure This is stable. Multifactorial. The patient history of hydronephrosis, diabetes and hypertension as well as peripheral vascular disease. #3 T3, N0, M0 adenocarcinoma of the colon No clinical evidence of recurrence. We'll discontinue  routine surveillance colonoscopy due to diagnosis of stage IV metastatic prostate cancer #4 stage IV metastatic prostate cancer He will continue treatment under the direction of his urologist.  Orders Placed This Encounter  Procedures  . CBC & Diff and Retic    Standing Status: Standing     Number of Occurrences: 22      Standing Expiration Date: 10/09/2014    All questions were answered. The patient knows to call the clinic with any problems, questions or concerns. I spent 25 minutes counseling the patient face to face. The total time spent in the appointment was 30 minutes and more than 50% was on counseling.     Heath Lark, MD 10/08/2013 1:00 PM

## 2013-10-08 NOTE — Telephone Encounter (Signed)
gv adn printed appts cehd adna vs for pt fro June thru [facility]

## 2013-10-09 ENCOUNTER — Other Ambulatory Visit: Payer: Self-pay | Admitting: Cardiology

## 2013-10-13 ENCOUNTER — Other Ambulatory Visit: Payer: Self-pay | Admitting: Cardiology

## 2013-11-02 ENCOUNTER — Encounter: Payer: Self-pay | Admitting: *Deleted

## 2013-11-05 ENCOUNTER — Other Ambulatory Visit: Payer: Self-pay | Admitting: Hematology and Oncology

## 2013-11-05 ENCOUNTER — Ambulatory Visit (HOSPITAL_BASED_OUTPATIENT_CLINIC_OR_DEPARTMENT_OTHER): Payer: Medicare Other

## 2013-11-05 ENCOUNTER — Other Ambulatory Visit (HOSPITAL_BASED_OUTPATIENT_CLINIC_OR_DEPARTMENT_OTHER): Payer: Medicare Other

## 2013-11-05 VITALS — BP 142/63 | HR 80 | Temp 98.0°F

## 2013-11-05 DIAGNOSIS — D631 Anemia in chronic kidney disease: Secondary | ICD-10-CM

## 2013-11-05 DIAGNOSIS — N189 Chronic kidney disease, unspecified: Secondary | ICD-10-CM

## 2013-11-05 DIAGNOSIS — N039 Chronic nephritic syndrome with unspecified morphologic changes: Secondary | ICD-10-CM

## 2013-11-05 DIAGNOSIS — C61 Malignant neoplasm of prostate: Secondary | ICD-10-CM

## 2013-11-05 LAB — CBC & DIFF AND RETIC
BASO%: 0.4 % (ref 0.0–2.0)
Basophils Absolute: 0 10*3/uL (ref 0.0–0.1)
EOS%: 3.2 % (ref 0.0–7.0)
Eosinophils Absolute: 0.3 10*3/uL (ref 0.0–0.5)
HEMATOCRIT: 30 % — AB (ref 38.4–49.9)
HGB: 10.2 g/dL — ABNORMAL LOW (ref 13.0–17.1)
Immature Retic Fract: 10.9 % — ABNORMAL HIGH (ref 3.00–10.60)
LYMPH#: 0.8 10*3/uL — AB (ref 0.9–3.3)
LYMPH%: 9.4 % — ABNORMAL LOW (ref 14.0–49.0)
MCH: 33.1 pg (ref 27.2–33.4)
MCHC: 34 g/dL (ref 32.0–36.0)
MCV: 97.4 fL (ref 79.3–98.0)
MONO#: 0.8 10*3/uL (ref 0.1–0.9)
MONO%: 9.5 % (ref 0.0–14.0)
NEUT#: 6.3 10*3/uL (ref 1.5–6.5)
NEUT%: 77.5 % — ABNORMAL HIGH (ref 39.0–75.0)
Platelets: 166 10*3/uL (ref 140–400)
RBC: 3.08 10*6/uL — ABNORMAL LOW (ref 4.20–5.82)
RDW: 13.4 % (ref 11.0–14.6)
RETIC CT ABS: 89.01 10*3/uL (ref 34.80–93.90)
Retic %: 2.89 % — ABNORMAL HIGH (ref 0.80–1.80)
WBC: 8.2 10*3/uL (ref 4.0–10.3)
nRBC: 0 % (ref 0–0)

## 2013-11-05 MED ORDER — DARBEPOETIN ALFA-POLYSORBATE 300 MCG/0.6ML IJ SOLN
300.0000 ug | Freq: Once | INTRAMUSCULAR | Status: AC
  Administered 2013-11-05: 300 ug via SUBCUTANEOUS
  Filled 2013-11-05: qty 0.6

## 2013-11-15 ENCOUNTER — Emergency Department (HOSPITAL_COMMUNITY): Payer: Medicare Other

## 2013-11-15 ENCOUNTER — Encounter (HOSPITAL_COMMUNITY): Payer: Medicare Other | Admitting: Anesthesiology

## 2013-11-15 ENCOUNTER — Inpatient Hospital Stay (HOSPITAL_COMMUNITY)
Admission: EM | Admit: 2013-11-15 | Discharge: 2013-11-19 | DRG: 023 | Disposition: A | Discharge status: Home Health | Payer: Medicare Other | Attending: Neurology | Admitting: Neurology

## 2013-11-15 ENCOUNTER — Encounter (HOSPITAL_COMMUNITY): Payer: Self-pay | Admitting: Emergency Medicine

## 2013-11-15 ENCOUNTER — Inpatient Hospital Stay (HOSPITAL_COMMUNITY): Payer: Medicare Other

## 2013-11-15 ENCOUNTER — Encounter (HOSPITAL_COMMUNITY)
Admission: EM | Disposition: A | Discharge status: Home Health | Payer: Self-pay | Source: Home / Self Care | Attending: Neurology

## 2013-11-15 ENCOUNTER — Inpatient Hospital Stay (HOSPITAL_COMMUNITY): Payer: Medicare Other | Admitting: Anesthesiology

## 2013-11-15 DIAGNOSIS — I6350 Cerebral infarction due to unspecified occlusion or stenosis of unspecified cerebral artery: Secondary | ICD-10-CM

## 2013-11-15 DIAGNOSIS — E0789 Other specified disorders of thyroid: Secondary | ICD-10-CM | POA: Diagnosis present

## 2013-11-15 DIAGNOSIS — G9340 Encephalopathy, unspecified: Secondary | ICD-10-CM

## 2013-11-15 DIAGNOSIS — E872 Acidosis, unspecified: Secondary | ICD-10-CM | POA: Diagnosis present

## 2013-11-15 DIAGNOSIS — R29898 Other symptoms and signs involving the musculoskeletal system: Secondary | ICD-10-CM | POA: Diagnosis present

## 2013-11-15 DIAGNOSIS — I6340 Cerebral infarction due to embolism of unspecified cerebral artery: Principal | ICD-10-CM | POA: Diagnosis present

## 2013-11-15 DIAGNOSIS — J9600 Acute respiratory failure, unspecified whether with hypoxia or hypercapnia: Secondary | ICD-10-CM

## 2013-11-15 DIAGNOSIS — C61 Malignant neoplasm of prostate: Secondary | ICD-10-CM

## 2013-11-15 DIAGNOSIS — R29810 Facial weakness: Secondary | ICD-10-CM | POA: Diagnosis present

## 2013-11-15 DIAGNOSIS — Z7982 Long term (current) use of aspirin: Secondary | ICD-10-CM

## 2013-11-15 DIAGNOSIS — Z8673 Personal history of transient ischemic attack (TIA), and cerebral infarction without residual deficits: Secondary | ICD-10-CM

## 2013-11-15 DIAGNOSIS — E785 Hyperlipidemia, unspecified: Secondary | ICD-10-CM | POA: Diagnosis present

## 2013-11-15 DIAGNOSIS — E871 Hypo-osmolality and hyponatremia: Secondary | ICD-10-CM | POA: Diagnosis present

## 2013-11-15 DIAGNOSIS — I6330 Cerebral infarction due to thrombosis of unspecified cerebral artery: Secondary | ICD-10-CM | POA: Diagnosis present

## 2013-11-15 DIAGNOSIS — I2510 Atherosclerotic heart disease of native coronary artery without angina pectoris: Secondary | ICD-10-CM | POA: Diagnosis present

## 2013-11-15 DIAGNOSIS — Z87891 Personal history of nicotine dependence: Secondary | ICD-10-CM

## 2013-11-15 DIAGNOSIS — I129 Hypertensive chronic kidney disease with stage 1 through stage 4 chronic kidney disease, or unspecified chronic kidney disease: Secondary | ICD-10-CM | POA: Diagnosis present

## 2013-11-15 DIAGNOSIS — G8190 Hemiplegia, unspecified affecting unspecified side: Secondary | ICD-10-CM | POA: Diagnosis present

## 2013-11-15 DIAGNOSIS — I639 Cerebral infarction, unspecified: Secondary | ICD-10-CM

## 2013-11-15 DIAGNOSIS — Z951 Presence of aortocoronary bypass graft: Secondary | ICD-10-CM

## 2013-11-15 DIAGNOSIS — I219 Acute myocardial infarction, unspecified: Secondary | ICD-10-CM

## 2013-11-15 DIAGNOSIS — J690 Pneumonitis due to inhalation of food and vomit: Secondary | ICD-10-CM | POA: Diagnosis present

## 2013-11-15 DIAGNOSIS — I252 Old myocardial infarction: Secondary | ICD-10-CM

## 2013-11-15 DIAGNOSIS — N390 Urinary tract infection, site not specified: Secondary | ICD-10-CM

## 2013-11-15 DIAGNOSIS — J90 Pleural effusion, not elsewhere classified: Secondary | ICD-10-CM | POA: Diagnosis present

## 2013-11-15 DIAGNOSIS — E119 Type 2 diabetes mellitus without complications: Secondary | ICD-10-CM | POA: Diagnosis present

## 2013-11-15 DIAGNOSIS — N189 Chronic kidney disease, unspecified: Secondary | ICD-10-CM | POA: Diagnosis present

## 2013-11-15 DIAGNOSIS — I10 Essential (primary) hypertension: Secondary | ICD-10-CM

## 2013-11-15 DIAGNOSIS — B964 Proteus (mirabilis) (morganii) as the cause of diseases classified elsewhere: Secondary | ICD-10-CM | POA: Diagnosis present

## 2013-11-15 DIAGNOSIS — I4892 Unspecified atrial flutter: Secondary | ICD-10-CM | POA: Diagnosis present

## 2013-11-15 DIAGNOSIS — K219 Gastro-esophageal reflux disease without esophagitis: Secondary | ICD-10-CM | POA: Diagnosis present

## 2013-11-15 DIAGNOSIS — I5022 Chronic systolic (congestive) heart failure: Secondary | ICD-10-CM

## 2013-11-15 DIAGNOSIS — Z950 Presence of cardiac pacemaker: Secondary | ICD-10-CM

## 2013-11-15 DIAGNOSIS — N179 Acute kidney failure, unspecified: Secondary | ICD-10-CM | POA: Diagnosis present

## 2013-11-15 DIAGNOSIS — I609 Nontraumatic subarachnoid hemorrhage, unspecified: Secondary | ICD-10-CM | POA: Diagnosis present

## 2013-11-15 DIAGNOSIS — I4891 Unspecified atrial fibrillation: Secondary | ICD-10-CM | POA: Diagnosis present

## 2013-11-15 HISTORY — PX: RADIOLOGY WITH ANESTHESIA: SHX6223

## 2013-11-15 LAB — CBC
HCT: 32.4 % — ABNORMAL LOW (ref 39.0–52.0)
Hemoglobin: 11.1 g/dL — ABNORMAL LOW (ref 13.0–17.0)
MCH: 34.2 pg — ABNORMAL HIGH (ref 26.0–34.0)
MCHC: 34.3 g/dL (ref 30.0–36.0)
MCV: 99.7 fL (ref 78.0–100.0)
PLATELETS: 175 10*3/uL (ref 150–400)
RBC: 3.25 MIL/uL — ABNORMAL LOW (ref 4.22–5.81)
RDW: 14.5 % (ref 11.5–15.5)
WBC: 7.3 10*3/uL (ref 4.0–10.5)

## 2013-11-15 LAB — COMPREHENSIVE METABOLIC PANEL WITH GFR
ALT: <5 U/L (ref 0–53)
AST: 10 U/L (ref 0–37)
Albumin: 3.2 g/dL — ABNORMAL LOW (ref 3.5–5.2)
Alkaline Phosphatase: 94 U/L (ref 39–117)
BUN: 42 mg/dL — ABNORMAL HIGH (ref 6–23)
CO2: 21 meq/L (ref 19–32)
Calcium: 9.6 mg/dL (ref 8.4–10.5)
Chloride: 98 meq/L (ref 96–112)
Creatinine, Ser: 2.03 mg/dL — ABNORMAL HIGH (ref 0.50–1.35)
GFR calc Af Amer: 33 mL/min — ABNORMAL LOW
GFR calc non Af Amer: 29 mL/min — ABNORMAL LOW
Glucose, Bld: 152 mg/dL — ABNORMAL HIGH (ref 70–99)
Potassium: 4.6 meq/L (ref 3.7–5.3)
Sodium: 137 meq/L (ref 137–147)
Total Bilirubin: 0.3 mg/dL (ref 0.3–1.2)
Total Protein: 6.9 g/dL (ref 6.0–8.3)

## 2013-11-15 LAB — PROTIME-INR
INR: 0.97 (ref 0.00–1.49)
PROTHROMBIN TIME: 12.9 s (ref 11.6–15.2)

## 2013-11-15 LAB — DIFFERENTIAL
Basophils Absolute: 0 K/uL (ref 0.0–0.1)
Basophils Relative: 0 % (ref 0–1)
Eosinophils Absolute: 0.3 K/uL (ref 0.0–0.7)
Eosinophils Relative: 4 % (ref 0–5)
Lymphocytes Relative: 18 % (ref 12–46)
Lymphs Abs: 1.3 K/uL (ref 0.7–4.0)
Monocytes Absolute: 0.8 K/uL (ref 0.1–1.0)
Monocytes Relative: 11 % (ref 3–12)
Neutro Abs: 4.8 K/uL (ref 1.7–7.7)
Neutrophils Relative %: 67 % (ref 43–77)

## 2013-11-15 LAB — I-STAT CHEM 8, ED
BUN: 38 mg/dL — ABNORMAL HIGH (ref 6–23)
Calcium, Ion: 1.08 mmol/L — ABNORMAL LOW (ref 1.13–1.30)
Chloride: 102 meq/L (ref 96–112)
Creatinine, Ser: 2.3 mg/dL — ABNORMAL HIGH (ref 0.50–1.35)
Glucose, Bld: 153 mg/dL — ABNORMAL HIGH (ref 70–99)
HCT: 33 % — ABNORMAL LOW (ref 39.0–52.0)
Hemoglobin: 11.2 g/dL — ABNORMAL LOW (ref 13.0–17.0)
Potassium: 4.4 meq/L (ref 3.7–5.3)
Sodium: 134 meq/L — ABNORMAL LOW (ref 137–147)
TCO2: 21 mmol/L (ref 0–100)

## 2013-11-15 LAB — APTT: aPTT: 24 s (ref 24–37)

## 2013-11-15 LAB — CBG MONITORING, ED: GLUCOSE-CAPILLARY: 170 mg/dL — AB (ref 70–99)

## 2013-11-15 LAB — I-STAT TROPONIN, ED: Troponin i, poc: 0 ng/mL (ref 0.00–0.08)

## 2013-11-15 SURGERY — RADIOLOGY WITH ANESTHESIA
Anesthesia: General

## 2013-11-15 MED ORDER — SUCCINYLCHOLINE CHLORIDE 20 MG/ML IJ SOLN
INTRAMUSCULAR | Status: DC | PRN
  Administered 2013-11-15: 100 mg via INTRAVENOUS

## 2013-11-15 MED ORDER — ONDANSETRON HCL 4 MG/2ML IJ SOLN
INTRAMUSCULAR | Status: AC
  Administered 2013-11-15: 4 mg via INTRAVENOUS
  Filled 2013-11-15: qty 2

## 2013-11-15 MED ORDER — PROPOFOL 10 MG/ML IV BOLUS
INTRAVENOUS | Status: DC | PRN
  Administered 2013-11-15: 100 mg via INTRAVENOUS
  Administered 2013-11-15 (×2): 50 mg via INTRAVENOUS
  Administered 2013-11-16 (×2): 100 mg via INTRAVENOUS

## 2013-11-15 MED ORDER — INSULIN ASPART 100 UNIT/ML ~~LOC~~ SOLN
2.0000 [IU] | SUBCUTANEOUS | Status: DC
  Administered 2013-11-16 (×2): 6 [IU] via SUBCUTANEOUS

## 2013-11-15 MED ORDER — URSODIOL 300 MG PO CAPS
300.0000 mg | ORAL_CAPSULE | Freq: Two times a day (BID) | ORAL | Status: DC
  Administered 2013-11-16 – 2013-11-19 (×6): 300 mg via ORAL
  Filled 2013-11-15 (×8): qty 1

## 2013-11-15 MED ORDER — ALTEPLASE 30 MG/30 ML FOR INTERV. RAD
1.0000 mg | INTRA_ARTERIAL | Status: AC | PRN
  Administered 2013-11-15: 7 mg via INTRA_ARTERIAL
  Administered 2013-11-16 (×2): 3 mg via INTRA_ARTERIAL
  Filled 2013-11-15: qty 30

## 2013-11-15 MED ORDER — PANTOPRAZOLE SODIUM 40 MG PO TBEC: 40.0000 mg | DELAYED_RELEASE_TABLET | Freq: Every day | ORAL | Status: DC

## 2013-11-15 MED ORDER — LIDOCAINE HCL (CARDIAC) 20 MG/ML IV SOLN
INTRAVENOUS | Status: DC | PRN
Start: 2013-11-15 — End: 2013-11-16
  Administered 2013-11-15: 60 mg via INTRAVENOUS

## 2013-11-15 MED ORDER — FENTANYL CITRATE 0.05 MG/ML IJ SOLN
INTRAMUSCULAR | Status: DC | PRN
  Administered 2013-11-15: 150 ug via INTRAVENOUS
  Administered 2013-11-15 – 2013-11-16 (×3): 50 ug via INTRAVENOUS
  Administered 2013-11-16 (×2): 100 ug via INTRAVENOUS

## 2013-11-15 MED ORDER — VECURONIUM BROMIDE 10 MG IV SOLR
INTRAVENOUS | Status: DC | PRN
Start: 2013-11-15 — End: 2013-11-16
  Administered 2013-11-15: 10 mg via INTRAVENOUS
  Administered 2013-11-16 (×2): 5 mg via INTRAVENOUS

## 2013-11-15 MED ORDER — SODIUM CHLORIDE 0.9 % IV SOLN: INTRAVENOUS | Status: DC | PRN

## 2013-11-15 MED ORDER — IOHEXOL 300 MG/ML  SOLN
150.0000 mL | Freq: Once | INTRAMUSCULAR | Status: AC | PRN
  Administered 2013-11-15: 100 mL via INTRAVENOUS

## 2013-11-15 MED ORDER — ATORVASTATIN CALCIUM 10 MG PO TABS
10.0000 mg | ORAL_TABLET | Freq: Every day | ORAL | Status: DC
  Administered 2013-11-16 – 2013-11-18 (×2): 10 mg via ORAL
  Filled 2013-11-15 (×4): qty 1

## 2013-11-15 MED ORDER — AMIODARONE HCL 100 MG PO TABS
100.0000 mg | ORAL_TABLET | Freq: Every day | ORAL | Status: DC
  Administered 2013-11-16 – 2013-11-19 (×4): 100 mg via ORAL
  Filled 2013-11-15 (×4): qty 1

## 2013-11-15 MED ORDER — ARTIFICIAL TEARS OP OINT
TOPICAL_OINTMENT | OPHTHALMIC | Status: DC | PRN
  Administered 2013-11-15: 1 via OPHTHALMIC

## 2013-11-15 MED ORDER — SODIUM CHLORIDE 0.9 % IV SOLN
INTRAVENOUS | Status: DC | PRN
  Administered 2013-11-15 – 2013-11-16 (×2): via INTRAVENOUS

## 2013-11-15 MED ORDER — ALTEPLASE (STROKE) FULL DOSE INFUSION
0.9000 mg/kg | Freq: Once | INTRAVENOUS | Status: AC
  Administered 2013-11-15: 88 mg via INTRAVENOUS
  Filled 2013-11-15: qty 88

## 2013-11-15 MED ORDER — IOHEXOL 350 MG/ML SOLN
50.0000 mL | Freq: Once | INTRAVENOUS | Status: AC | PRN
  Administered 2013-11-15: 50 mL via INTRAVENOUS

## 2013-11-15 MED ORDER — ONDANSETRON HCL 4 MG/2ML IJ SOLN
4.0000 mg | Freq: Once | INTRAMUSCULAR | Status: AC
  Administered 2013-11-15: 4 mg via INTRAVENOUS

## 2013-11-15 MED ORDER — FINASTERIDE 5 MG PO TABS
5.0000 mg | ORAL_TABLET | Freq: Every evening | ORAL | Status: DC
  Administered 2013-11-16 – 2013-11-18 (×2): 5 mg via ORAL
  Filled 2013-11-15 (×4): qty 1

## 2013-11-15 NOTE — Anesthesia Procedure Notes (Signed)
Procedure Name: Intubation Date/Time: 11/15/2013 11:12 PM Performed by: Jacquiline Doe A Pre-anesthesia Checklist: Patient identified, Patient being monitored, Emergency Drugs available, Timeout performed and Suction available Patient Re-evaluated:Patient Re-evaluated prior to inductionOxygen Delivery Method: Circle system utilized Intubation Type: IV induction, Rapid sequence and Cricoid Pressure applied Laryngoscope Size: Mac and 4 Grade View: Grade I Tube type: Subglottic suction tube Tube size: 7.5 mm Number of attempts: 1 Airway Equipment and Method: Stylet Placement Confirmation: ETT inserted through vocal cords under direct vision,  breath sounds checked- equal and bilateral and positive ETCO2 Secured at: 23 cm Tube secured with: Tape Dental Injury: Teeth and Oropharynx as per pre-operative assessment

## 2013-11-15 NOTE — ED Notes (Signed)
Pt from with stroke-like symptoms that started at 2040 per daughter.  Pt was eating a pretzel when pt's daughter noticed pt moaning.  Pt diaphoretic on EMS arrival with left-sided facial droop, slurred speech and left-sided weakness.  CBG 162 with EMS.  Pt with hx of afib but is not on any blood thinners.

## 2013-11-15 NOTE — Progress Notes (Signed)
1st stick in IR [id number]

## 2013-11-15 NOTE — ED Notes (Signed)
Airway cleared by Dr. Tamera Punt at the bridge.

## 2013-11-15 NOTE — ED Notes (Signed)
Pt to go to IR per Dr. Leonel Ramsay

## 2013-11-15 NOTE — Code Documentation (Signed)
80 year old male presents to [facility] via [place] as code stroke.  Patient was at home in his usual state of health when at 2040 his daughter went to the kitchen to fix him something to eat.  On return she noticed he was weak on his left side and had speech that was garbled.  She called 911 and EMS called the code stroke in the field.  On arrival he was drowsy - left side flacid - left facial droop - left neglect - right forced gaze - and speech was unintelligible.  He could follow a few simple commands.  NIHSS was 21.  HOB was raised to 45 degrees secondary to airway concerns.   Dr. Leonel Ramsay present - speaking with family.  tPA was inititated with a bolus at 2139.  At 2145 he was noted to move the left side.  At 2155 he was able to speak with some slurring - left arm and leg now with a drift only.  NIHSS 13.  A CTA was performed per order.  Post tPA he was still with slurred speech - intermittently worse at times - still with complete left field cut - right gaze preference - left neglect - drowsy - left facial palsy - sensory loss - dysarthria.  Airway protected at this time.  Complains of some nausea - Dr. Leonel Ramsay aware - Zofran per order.  To IR with team at 2255 - handoff to [place] in IR.  Family updated by Dr. Leonel Ramsay and Dr. Estanislado Pandy.

## 2013-11-15 NOTE — H&P (Addendum)
Neurology Consultation Reason for Consult: left sided weakness.  Referring Physician: Horton, C  CC: left sided weakness  History is obtained from:Patient's son and daughter  HPI: Jeffery Gibson is a 80 y.o. male with a history of multiple stroke risk factors including afib not on anticoagulation due to history of GI bleeding(none recently). He was in his normal state of health until 8:40 pm at which time his daughter went in to the kitchen to fix him a sandwhich. On returning, he was weak on the left side and unintelligible.   He was functional at baseline, attending to his own adls.   LKW: 8:40 pm tpa given?: yes.    ROS: Unable to obtain due to severe dysarthria.   Past Medical History  Diagnosis Date  . Ischemic heart disease 05/06/06    post CARG 05/06/06  . Obese     exogenous  . Renal insufficiency   . Anemia of chronic disease     aranesp injections  . Dyslipidemia   . Coronary artery disease   . Anemia associated with chronic renal failure 04/05/2011  . Cancer     prostate/on Lupron  . Adenocarcinoma of colon 11/2003    stage 2(T3,N0,M0)  . Diabetes mellitus   . Hypertension   . Anginal pain   . Myocardial infarction 1986  . Pacemaker   . Cataracts, bilateral   . Seizures   . Arthritis   . Hemorrhoids   . GERD (gastroesophageal reflux disease)     hx of  . Hematoma     hx of  . Atrial flutter     afib and atrial flutter (permanent)  . Tachycardia-bradycardia 1997    dual-chamber/for tachybradycardia syndrome  . Sleep apnea     no cpap    Family History: Unable to assess secondary to patient's altered mental status.    Social History: Tob: Unable to assess secondary to patient's altered mental status.    Exam: Current vital signs: Filed Vitals:   11/15/13 2247  BP: 159/56  Pulse: 61  Temp:   Resp: 13   Vital signs in last 24 hours:    General: in bed, drowsy CV: irregular Mental Status: Patient is drowsy, but does follow  commands Follow commands, able to understand  Cranial Nerves: II: Left hemianopia. Pupils are equal, round, and reactive to light.  Discs are difficult to visualize. III,IV, VI: Forced right gaze deviation V: Facial sensation is deccreased on left VII: Facial movement is left facial droop VIII: hearing is intact to voice XII: tongue with left deviation Motor: Tone is normal. Bulk is normal. 5/5 strength was present on the right. Initially, he had no movement on the left.  Sensory: Sensation is decreased on the left.  Deep Tendon Reflexes: 2+ and symmetric in the biceps and patellae.  Cerebellar: Does not perform Gait: Not assessed due to acute nature of evaluation and multiple medical monitors in ED setting.  I have reviewed labs in epic and the results pertinent to this consultation are: Elevated creatinine, but appears near baseline.   I have reviewed the images obtained: CT head - no bleed, CTA shows M1/M2 occlusion.   Impression: 80 yo M with R MCA occlusion. He has had some improvement with tpa, but CTA shows persistent occlusion and interventional radiology has been notified and are planning to take for intervention.   Recommendations: 1. IR intervention for thrombectomy.  2. HgbA1c, fasting lipid panel 3. CT head at 24 hours post contrast.  4. Frequent  neuro checks 5. Echocardiogram 6. Carotid dopplers 7. Prophylactic therapy-Antiplatelet med: Aspirin - dose 325mg  PO or 300mg  PR 8. Risk factor modification 9. Telemetry monitoring 10. PT consult, OT consult, Speech consult 11. CCM consult for vent management.  12. ICU hyperglycemia protocol.  13. Medication for prostate cancer, Gillermina Phy is non-formulary and we do not carry it, when restarting, will need to bring from home.    This patient is critically ill and at significant risk of neurological worsening, death and care requires constant monitoring of vital signs, hemodynamics,respiratory and cardiac monitoring,  neurological assessment, discussion with family, other specialists and medical decision making of high complexity. I spent 90 minutes of neurocritical care time  in the care of  this patient.  Roland Rack, MD [facility] [phone]  If 7pm- 7am, please page neurology on call as listed in [place]. 11/15/2013  11:31 PM

## 2013-11-15 NOTE — ED Provider Notes (Signed)
CSN: [id number]     Arrival date & time 11/15/13  2111 History   First MD Initiated Contact with Patient 11/15/13 2145     No chief complaint on file.    (Consider location/radiation/quality/duration/timing/severity/associated sxs/prior Treatment) HPI  This is an 80 year old gentleman with a history of ischemic heart disease, renal insufficiency, atrial fibrillation, diabetes, hypertension who presents as a code stroke.    At approximately 8:40 PM this evening, the patient's daughter noted that he had slurred speech was mumbling and left-sided facial droop. Prior to that patient was at his baseline. Daughter called EMS.  Patient has a history of atrial fibrillation but is not currently on any anticoagulation. On initial evaluation, he was cleared by my colleague for CT as a code stroke. During CT, patient's mental status declined and he became more somnolent. I was asked to evaluate the patient emergently at the bedside with concerns for airway compromise. Patient is able to follow commands but is not speaking. He is unable to provide history.    Level V caveat for altered mental status.  Past Medical History  Diagnosis Date  . Ischemic heart disease 05/06/06    post CARG 05/06/06  . Obese     exogenous  . Renal insufficiency   . Anemia of chronic disease     aranesp injections  . Dyslipidemia   . Coronary artery disease   . Anemia associated with chronic renal failure 04/05/2011  . Cancer     prostate/on Lupron  . Adenocarcinoma of colon 11/2003    stage 2(T3,N0,M0)  . Diabetes mellitus   . Hypertension   . Anginal pain   . Myocardial infarction 1986  . Pacemaker   . Cataracts, bilateral   . Seizures   . Arthritis   . Hemorrhoids   . GERD (gastroesophageal reflux disease)     hx of  . Hematoma     hx of  . Atrial flutter     afib and atrial flutter (permanent)  . Tachycardia-bradycardia 1997    dual-chamber/for tachybradycardia syndrome  . Sleep apnea     no cpap    Past Surgical History  Procedure Laterality Date  . Laparotomy  12/04/2003    resection of rectosigmoid carcinoma/  . Radioactive seed implant  05/2005    transperianeal placement I-125 for prostate cancer/# of  seeds 55  . Cardiac catheterization  05/01/06    EF 40%/diffuse 3 vessel CAD/tight L antereior descending artery stenosis/diffuse disease proximal L anterior descending  . Pacemaker removal  1997  . Fracture surgery      left ankle  . Insert / replace / remove pacemaker  2006  . Colon surgery  2005  . Coronary artery bypass graft  05/06/06    x4 with L internal mammary artery to the L anterior descending coronary artery  . Laser surgery  2012    prostate  . Orchiectomy  august 2013  . Cystoscopy w/ ureteral stent placement Bilateral 08/04/2012    Procedure: CYSTOSCOPY WITH bilateral retrograde;  Surgeon: Dutch Gray, MD;  Location: [facility];  Service: Urology;  Laterality: Bilateral;  attempted stent placement   . Peripherally inserted central catheter insertion  10-30-12    right upper arm-Antibiotic IV "urinary pseudomonas"  . Cystoscopy w/ ureteral stent placement Bilateral 11/03/2012    Procedure: CYSTOSCOPY WITH STENT REPLACEMENT;  Surgeon: Dutch Gray, MD;  Location: [facility];  Service: Urology;  Laterality: Bilateral;   Family History  Problem Relation Age of Onset  . Heart  failure Father 83  . Gallbladder disease Father   . Cancer Son     glioblastoma   History  Substance Use Topics  . Smoking status: Former Smoker -- 10 years    Types: Cigarettes    Quit date: 05/22/1963  . Smokeless tobacco: Never Used  . Alcohol Use: No    Review of Systems  Unable to perform ROS: Mental status change      Allergies  Adhesive; Latex; Lovenox; Lupron; and Warfarin and related  Home Medications   Prior to Admission medications   Medication Sig Start Date End Date Taking? Authorizing Provider  Darbepoetin Alfa-Albumin (ARANESP IJ) Inject 1 Syringe as directed See  admin instructions. Every 4 weeks as needed for anemia if hemoglobin is below 11   Yes Historical Provider, MD  insulin glargine (LANTUS) 100 UNIT/ML injection 10 units daily or as directed 03/16/13  Yes Darlin Coco, MD  acetaminophen (TYLENOL) 500 MG tablet Take 500 mg by mouth every 6 (six) hours as needed for pain or fever.    Historical Provider, MD  amLODipine (NORVASC) 5 MG tablet Take 5 mg by mouth every morning.    Historical Provider, MD  Aromatic Inhalants (VICKS VAPOR INHALER IN) Inhale 1 puff into the lungs daily as needed (for dry air).    Historical Provider, MD  aspirin EC 81 MG tablet Take 81 mg by mouth every evening.     Historical Provider, MD  Cholecalciferol (VITAMIN D) 2000 UNITS tablet Take 2,000 Units by mouth daily at 12 noon.     Historical Provider, MD  enzalutamide Gillermina Phy) 40 MG capsule Take 160 mg by mouth every morning.     Historical Provider, MD  finasteride (PROSCAR) 5 MG tablet Take 5 mg by mouth every evening.     Historical Provider, MD  furosemide (LASIX) 40 MG tablet Take 40 mg by mouth as directed. 1/2 TABLET DAILY    Historical Provider, MD  rosuvastatin (CRESTOR) 10 MG tablet Take 5 mg by mouth every evening.  02/20/12   Thayer Headings, MD  ursodiol (ACTIGALL) 300 MG capsule Take 300 mg by mouth 2 (two) times daily.     Historical Provider, MD  vitamin C (ASCORBIC ACID) 500 MG tablet Take 500 mg by mouth daily.    Historical Provider, MD   There were no vitals taken for this visit. Physical Exam  Nursing note and vitals reviewed. Constitutional:  Elderly, somnolent but arousable, following simple commands, diaphoretic  HENT:  Head: Normocephalic and atraumatic.  Eyes:  Pupils 4 mm reactive bilaterally  Neck: Neck supple.  Cardiovascular: Normal rate and normal heart sounds.   No murmur heard. Irregular rhythm  Pulmonary/Chest: Effort normal and breath sounds normal. No respiratory distress. He has no wheezes.  Midline sternotomy scar   Abdominal: Soft. Bowel sounds are normal. There is no tenderness.  Protuberant  Musculoskeletal: He exhibits no edema.  Lymphadenopathy:    He has no cervical adenopathy.  Neurological: GCS eye subscore is 3. GCS verbal subscore is 2. GCS motor subscore is 6.  Somnolent but arousable, following simple commands, left-sided facial droop noted, left-sided upper and lower extremity weakness  Skin: Skin is warm and dry.  Psychiatric: He has a normal mood and affect.    ED Course  Procedures (including critical care time)  CRITICAL CARE Performed by: Thayer Jew, F   Total critical care time: 35 min  Critical care time was exclusive of separately billable procedures and treating other patients.  Critical care  was necessary to treat or prevent imminent or life-threatening deterioration.  Critical care was time spent personally by me on the following activities: development of treatment plan with patient and/or surrogate as well as nursing, discussions with consultants, evaluation of patient's response to treatment, examination of patient, obtaining history from patient or surrogate, ordering and performing treatments and interventions, ordering and review of laboratory studies, ordering and review of radiographic studies, pulse oximetry and re-evaluation of patient's condition.  Labs Review Labs Reviewed  CBC - Abnormal; Notable for the following:    RBC 3.25 (*)    Hemoglobin 11.1 (*)    HCT 32.4 (*)    MCH 34.2 (*)    All other components within normal limits  COMPREHENSIVE METABOLIC PANEL - Abnormal; Notable for the following:    Glucose, Bld 152 (*)    BUN 42 (*)    Creatinine, Ser 2.03 (*)    Albumin 3.2 (*)    GFR calc non Af Amer 29 (*)    GFR calc Af Amer 33 (*)    All other components within normal limits  I-STAT CHEM 8, ED - Abnormal; Notable for the following:    Sodium 134 (*)    BUN 38 (*)    Creatinine, Ser 2.30 (*)    Glucose, Bld 153 (*)    Calcium, Ion  1.08 (*)    Hemoglobin 11.2 (*)    HCT 33.0 (*)    All other components within normal limits  PROTIME-INR  APTT  DIFFERENTIAL  I-STAT TROPOININ, ED  CBG MONITORING, ED    Imaging Review No results found.   EKG Interpretation None      MDM   Final diagnoses:  Acute ischemic stroke    Patient presents as a code stroke.  Noted to have dense left-sided hemiparesis and left facial droop. On my initial evaluation, patient with altered mental status and somnolence. GCS of 11. Patient does have a living will and power of attorney. Per discussions with family, patient to be intubated if for short period of time. Neurology at bedside. Suspect large MCA territory stroke.  TPA administered. During discussions with family members regarding intubation given patient's altered mental status and projected clinical course, patient's mental status improved and he now is speaking and oriented. Also improvement with left-sided facial droop. Patient proceeded to CTA and was found to have an acute embolus. Patient will go to IR. Further care per neurology.      Merryl Hacker, MD 11/15/13 2228

## 2013-11-15 NOTE — ED Notes (Signed)
Speech starting to become slurred again during assessment.  Pt unable to identify objects in room.  Pt attempting to name object, unable to get words out.  Pressure 151/69.  Airway intact, SpO2 98% on 2L.  Stroke RN made aware. Head of bed laid to 10-15 degrees.

## 2013-11-15 NOTE — ED Notes (Signed)
Pt reporting nausea.  Zofran to be given per protocol.

## 2013-11-15 NOTE — ED Notes (Signed)
Condom cath reapplied to pt.

## 2013-11-15 NOTE — Anesthesia Preprocedure Evaluation (Addendum)
Anesthesia Evaluation  Patient identified by MRN, date of birth, ID band Patient confused    Reviewed: Allergy & Precautions, H&P , NPO status , Patient's Chart, lab work & pertinent test results  Airway Mallampati: III TM Distance: >3 FB Neck ROM: Full    Dental no notable dental hx. (+) Teeth Intact, Dental Advisory Given   Pulmonary sleep apnea , former smoker,  breath sounds clear to auscultation  Pulmonary exam normal       Cardiovascular hypertension, Pt. on medications + CAD, + Past MI, + CABG and +CHF + pacemaker Rhythm:Regular Rate:Normal     Neuro/Psych CVA negative neurological ROS  negative psych ROS   GI/Hepatic Neg liver ROS, GERD-  Medicated,  Endo/Other  negative endocrine ROSdiabetes, Type 1, Insulin Dependent  Renal/GU Renal InsufficiencyRenal diseasenegative Renal ROS  negative genitourinary   Musculoskeletal   Abdominal   Peds  Hematology negative hematology ROS (+)   Anesthesia Other Findings   Reproductive/Obstetrics negative OB ROS                          Anesthesia Physical Anesthesia Plan  ASA: IV  Anesthesia Plan: General   Post-op Pain Management:    Induction: Intravenous, Rapid sequence and Cricoid pressure planned  Airway Management Planned: Oral ETT  Additional Equipment: Arterial line  Intra-op Plan:   Post-operative Plan: Post-operative intubation/ventilation  Informed Consent: I have reviewed the patients History and Physical, chart, labs and discussed the procedure including the risks, benefits and alternatives for the proposed anesthesia with the patient or authorized representative who has indicated his/her understanding and acceptance.   Dental advisory given  Plan Discussed with: CRNA  Anesthesia Plan Comments:         Anesthesia Quick Evaluation

## 2013-11-15 NOTE — ED Notes (Signed)
On arrival to room, pt very diaphoretic with minimal response. Pt responsive to sternal rub.  Concerns about airway. EDP at bedside to reassess airway with neurologist.  Pt on 2L [place], sating upper 90's.  Interventions discussed with family at bedside.

## 2013-11-16 ENCOUNTER — Encounter (HOSPITAL_COMMUNITY): Payer: Self-pay | Admitting: Radiology

## 2013-11-16 ENCOUNTER — Inpatient Hospital Stay (HOSPITAL_COMMUNITY): Payer: Medicare Other

## 2013-11-16 ENCOUNTER — Telehealth: Payer: Self-pay | Admitting: Cardiology

## 2013-11-16 DIAGNOSIS — J690 Pneumonitis due to inhalation of food and vomit: Secondary | ICD-10-CM | POA: Diagnosis not present

## 2013-11-16 DIAGNOSIS — I517 Cardiomegaly: Secondary | ICD-10-CM

## 2013-11-16 DIAGNOSIS — I6350 Cerebral infarction due to unspecified occlusion or stenosis of unspecified cerebral artery: Secondary | ICD-10-CM

## 2013-11-16 DIAGNOSIS — J9600 Acute respiratory failure, unspecified whether with hypoxia or hypercapnia: Secondary | ICD-10-CM

## 2013-11-16 DIAGNOSIS — G9340 Encephalopathy, unspecified: Secondary | ICD-10-CM

## 2013-11-16 DIAGNOSIS — I6340 Cerebral infarction due to embolism of unspecified cerebral artery: Secondary | ICD-10-CM | POA: Diagnosis not present

## 2013-11-16 DIAGNOSIS — I609 Nontraumatic subarachnoid hemorrhage, unspecified: Secondary | ICD-10-CM | POA: Diagnosis not present

## 2013-11-16 DIAGNOSIS — I5022 Chronic systolic (congestive) heart failure: Secondary | ICD-10-CM

## 2013-11-16 LAB — BASIC METABOLIC PANEL
BUN: 38 mg/dL — ABNORMAL HIGH (ref 6–23)
CHLORIDE: 99 meq/L (ref 96–112)
CO2: 19 mEq/L (ref 19–32)
CREATININE: 1.77 mg/dL — AB (ref 0.50–1.35)
Calcium: 8.3 mg/dL — ABNORMAL LOW (ref 8.4–10.5)
GFR calc non Af Amer: 34 mL/min — ABNORMAL LOW (ref >90)
GFR, EST AFRICAN AMERICAN: 39 mL/min — AB (ref >90)
Glucose, Bld: 242 mg/dL — ABNORMAL HIGH (ref 70–99)
Potassium: 4.6 mEq/L (ref 3.7–5.3)
Sodium: 135 mEq/L — ABNORMAL LOW (ref 137–147)

## 2013-11-16 LAB — LIPID PANEL
Cholesterol: 125 mg/dL (ref 0–200)
HDL: 38 mg/dL — AB (ref >39)
LDL Cholesterol: 42 mg/dL (ref 0–99)
Total CHOL/HDL Ratio: 3.3 RATIO
Triglycerides: 227 mg/dL — ABNORMAL HIGH (ref <150)
VLDL: 45 mg/dL — ABNORMAL HIGH (ref 0–40)

## 2013-11-16 LAB — CBC WITH DIFFERENTIAL/PLATELET
BASOS PCT: 0 % (ref 0–1)
Basophils Absolute: 0 10*3/uL (ref 0.0–0.1)
Eosinophils Absolute: 0 10*3/uL (ref 0.0–0.7)
Eosinophils Relative: 0 % (ref 0–5)
HEMATOCRIT: 28.2 % — AB (ref 39.0–52.0)
Hemoglobin: 9.6 g/dL — ABNORMAL LOW (ref 13.0–17.0)
Lymphocytes Relative: 5 % — ABNORMAL LOW (ref 12–46)
Lymphs Abs: 0.5 10*3/uL — ABNORMAL LOW (ref 0.7–4.0)
MCH: 33.4 pg (ref 26.0–34.0)
MCHC: 34 g/dL (ref 30.0–36.0)
MCV: 98.3 fL (ref 78.0–100.0)
MONO ABS: 0.4 10*3/uL (ref 0.1–1.0)
Monocytes Relative: 5 % (ref 3–12)
NEUTROS ABS: 8 10*3/uL — AB (ref 1.7–7.7)
NEUTROS PCT: 90 % — AB (ref 43–77)
PLATELETS: 145 10*3/uL — AB (ref 150–400)
RBC: 2.87 MIL/uL — ABNORMAL LOW (ref 4.22–5.81)
RDW: 14.4 % (ref 11.5–15.5)
WBC: 8.9 10*3/uL (ref 4.0–10.5)

## 2013-11-16 LAB — HEMOGLOBIN A1C
Hgb A1c MFr Bld: 6.6 % — ABNORMAL HIGH (ref <5.7)
Mean Plasma Glucose: 143 mg/dL — ABNORMAL HIGH (ref <117)

## 2013-11-16 LAB — GLUCOSE, CAPILLARY
GLUCOSE-CAPILLARY: 200 mg/dL — AB (ref 70–99)
GLUCOSE-CAPILLARY: 110 mg/dL — AB (ref 70–99)
Glucose-Capillary: 159 mg/dL — ABNORMAL HIGH (ref 70–99)
Glucose-Capillary: 162 mg/dL — ABNORMAL HIGH (ref 70–99)
Glucose-Capillary: 168 mg/dL — ABNORMAL HIGH (ref 70–99)
Glucose-Capillary: 230 mg/dL — ABNORMAL HIGH (ref 70–99)

## 2013-11-16 LAB — TRIGLYCERIDES: TRIGLYCERIDES: 232 mg/dL — AB (ref <150)

## 2013-11-16 LAB — MRSA PCR SCREENING: MRSA BY PCR: NEGATIVE

## 2013-11-16 MED ORDER — ETOMIDATE 2 MG/ML IV SOLN
INTRAVENOUS | Status: AC
  Administered 2013-11-16: 20 mg via INTRAVENOUS
  Filled 2013-11-16: qty 20

## 2013-11-16 MED ORDER — NICARDIPINE HCL IN NACL 20-0.86 MG/200ML-% IV SOLN
5.0000 mg/h | INTRAVENOUS | Status: DC
  Administered 2013-11-16: 15 mg/h via INTRAVENOUS
  Administered 2013-11-16: 7.5 mg/h via INTRAVENOUS
  Administered 2013-11-16 (×3): 15 mg/h via INTRAVENOUS
  Filled 2013-11-16 (×5): qty 200

## 2013-11-16 MED ORDER — ACETAMINOPHEN 500 MG PO TABS: 1000.0000 mg | ORAL_TABLET | Freq: Four times a day (QID) | ORAL | Status: DC | PRN

## 2013-11-16 MED ORDER — ACETAMINOPHEN 650 MG RE SUPP: 650.0000 mg | Freq: Four times a day (QID) | RECTAL | Status: DC | PRN

## 2013-11-16 MED ORDER — ADULT MULTIVITAMIN LIQUID CH
5.0000 mL | Freq: Every day | ORAL | Status: DC
  Administered 2013-11-16 – 2013-11-17 (×2): 5 mL
  Filled 2013-11-16 (×4): qty 5

## 2013-11-16 MED ORDER — ENZALUTAMIDE 40 MG PO CAPS: 160.0000 mg | ORAL_CAPSULE | Freq: Every day | ORAL | Status: DC

## 2013-11-16 MED ORDER — ACETAMINOPHEN 325 MG PO TABS
650.0000 mg | ORAL_TABLET | ORAL | Status: DC | PRN
  Administered 2013-11-16 – 2013-11-19 (×4): 650 mg via ORAL
  Filled 2013-11-16 (×4): qty 2

## 2013-11-16 MED ORDER — PANTOPRAZOLE SODIUM 40 MG IV SOLR
40.0000 mg | Freq: Every day | INTRAVENOUS | Status: DC
  Administered 2013-11-16 – 2013-11-18 (×3): 40 mg via INTRAVENOUS
  Filled 2013-11-16 (×5): qty 40

## 2013-11-16 MED ORDER — ETOMIDATE 2 MG/ML IV SOLN
20.0000 mg | Freq: Once | INTRAVENOUS | Status: AC
  Administered 2013-11-16: 20 mg via INTRAVENOUS

## 2013-11-16 MED ORDER — SODIUM CHLORIDE 0.9 % IV SOLN
INTRAVENOUS | Status: DC
  Administered 2013-11-17 (×2): via INTRAVENOUS

## 2013-11-16 MED ORDER — INSULIN ASPART 100 UNIT/ML ~~LOC~~ SOLN
0.0000 [IU] | SUBCUTANEOUS | Status: DC
  Administered 2013-11-16 (×3): 3 [IU] via SUBCUTANEOUS
  Administered 2013-11-17: 5 [IU] via SUBCUTANEOUS
  Administered 2013-11-17: 2 [IU] via SUBCUTANEOUS
  Administered 2013-11-17: 3 [IU] via SUBCUTANEOUS
  Administered 2013-11-17: 5 [IU] via SUBCUTANEOUS
  Administered 2013-11-17: 2 [IU] via SUBCUTANEOUS
  Administered 2013-11-18: 3 [IU] via SUBCUTANEOUS
  Administered 2013-11-18 – 2013-11-19 (×2): 2 [IU] via SUBCUTANEOUS

## 2013-11-16 MED ORDER — CHLORHEXIDINE GLUCONATE 0.12 % MT SOLN
15.0000 mL | Freq: Two times a day (BID) | OROMUCOSAL | Status: DC
  Administered 2013-11-16 – 2013-11-19 (×4): 15 mL via OROMUCOSAL
  Filled 2013-11-16 (×4): qty 15

## 2013-11-16 MED ORDER — PROPOFOL 10 MG/ML IV EMUL
0.0000 ug/kg/min | INTRAVENOUS | Status: DC
  Administered 2013-11-16: 50 ug/kg/min via INTRAVENOUS
  Administered 2013-11-16: 20 ug/kg/min via INTRAVENOUS
  Administered 2013-11-16: 30 ug/kg/min via INTRAVENOUS
  Administered 2013-11-16: 20 ug/kg/min via INTRAVENOUS
  Administered 2013-11-17: 25 ug/kg/min via INTRAVENOUS
  Filled 2013-11-16 (×4): qty 100

## 2013-11-16 MED ORDER — MIDAZOLAM HCL 5 MG/5ML IJ SOLN
INTRAMUSCULAR | Status: DC | PRN
  Administered 2013-11-16: 2 mg via INTRAVENOUS

## 2013-11-16 MED ORDER — PRO-STAT SUGAR FREE PO LIQD
60.0000 mL | Freq: Three times a day (TID) | ORAL | Status: DC
  Administered 2013-11-16 – 2013-11-17 (×3): 60 mL
  Filled 2013-11-16 (×8): qty 60

## 2013-11-16 MED ORDER — LABETALOL HCL 5 MG/ML IV SOLN
INTRAVENOUS | Status: DC | PRN
  Administered 2013-11-16 (×2): 10 mg via INTRAVENOUS

## 2013-11-16 MED ORDER — ACETAMINOPHEN 650 MG RE SUPP: 650.0000 mg | RECTAL | Status: DC | PRN

## 2013-11-16 MED ORDER — BIOTENE DRY MOUTH MT LIQD
15.0000 mL | Freq: Two times a day (BID) | OROMUCOSAL | Status: DC
  Administered 2013-11-16 – 2013-11-19 (×6): 15 mL via OROMUCOSAL

## 2013-11-16 MED ORDER — ONDANSETRON HCL 4 MG/2ML IJ SOLN: 4.0000 mg | Freq: Four times a day (QID) | INTRAMUSCULAR | Status: DC | PRN

## 2013-11-16 MED ORDER — VITAL HIGH PROTEIN PO LIQD
1000.0000 mL | ORAL | Status: DC
  Administered 2013-11-16: 1000 mL
  Filled 2013-11-16 (×3): qty 1000

## 2013-11-16 MED ORDER — PROPOFOL INFUSION 10 MG/ML OPTIME
INTRAVENOUS | Status: DC | PRN
  Administered 2013-11-16: 25 ug/kg/min via INTRAVENOUS

## 2013-11-16 MED ORDER — SODIUM CHLORIDE 0.9 % IV SOLN
10.0000 mg | INTRAVENOUS | Status: DC | PRN
  Administered 2013-11-15: 25 ug/min via INTRAVENOUS

## 2013-11-16 MED ORDER — ATROPINE SULFATE 1 MG/ML IJ SOLN
INTRAMUSCULAR | Status: AC
  Filled 2013-11-16: qty 1

## 2013-11-16 MED ORDER — LABETALOL HCL 5 MG/ML IV SOLN
10.0000 mg | INTRAVENOUS | Status: DC | PRN
  Administered 2013-11-16 (×4): 10 mg via INTRAVENOUS
  Filled 2013-11-16 (×4): qty 4

## 2013-11-16 MED ORDER — SODIUM CHLORIDE 0.9 % IV SOLN
INTRAVENOUS | Status: DC
  Administered 2013-11-16: 03:00:00 via INTRAVENOUS

## 2013-11-16 MED ORDER — FENTANYL CITRATE 0.05 MG/ML IJ SOLN: 25.0000 ug | INTRAMUSCULAR | Status: DC | PRN

## 2013-11-16 MED ORDER — PROPOFOL 10 MG/ML IV EMUL
INTRAVENOUS | Status: AC
  Filled 2013-11-16: qty 100

## 2013-11-16 MED ORDER — NICARDIPINE HCL IN NACL 40-0.83 MG/200ML-% IV SOLN
3.0000 mg/h | INTRAVENOUS | Status: DC
  Administered 2013-11-16: 15 mg/h via INTRAVENOUS
  Administered 2013-11-16: 10 mg/h via INTRAVENOUS
  Filled 2013-11-16 (×2): qty 200

## 2013-11-16 NOTE — Transfer of Care (Signed)
Immediate Anesthesia Transfer of Care Note  Patient: Jeffery Gibson  Procedure(s) Performed: Procedure(s): RADIOLOGY WITH ANESTHESIA (N/A)  Patient Location: NICU  Anesthesia Type:General  Level of Consciousness: sedated and Patient remains intubated per anesthesia plan  Airway & Oxygen Therapy: Patient remains intubated per anesthesia plan and Patient placed on Ventilator (see vital sign flow sheet for setting)  Post-op Assessment: Report given to NICU RN . Vital signs checked and stable .  Post vital signs: Reviewed and stable  Complications: No apparent anesthesia complications

## 2013-11-16 NOTE — Plan of Care (Signed)
Problem: Phase I Progression Outcomes Goal: Distal pulses equal to baseline Outcome: Completed/Met Date Met:  11/16/13 Distal pulses are +1 bilaterally

## 2013-11-16 NOTE — Procedures (Signed)
Extubation Procedure Note  Patient Details:   Name: Jeffery Gibson DOB: February 16, 1929 MRN: [id number]   Airway Documentation:  Airway 7.5 mm (Active)  Secured at (cm) 23 cm 11/16/2013 11:55 AM  Measured From Lips 11/16/2013 11:55 AM  Secured Location Right 11/16/2013 11:55 AM  Secured By [employer] 11/16/2013 11:55 AM    Evaluation  O2 sats: stable throughout and currently acceptable Complications: Complications of Decreased respiratory effort, increased accessory muscle use. Patient did tolerate procedure well. Bilateral Breath Sounds: Diminished Suctioning: Airway Yes  Miquel Dunn 11/16/2013, 3:23 PM

## 2013-11-16 NOTE — Progress Notes (Signed)
Patient transported to CT and back without issues. Patient back in room and resting well.

## 2013-11-16 NOTE — Progress Notes (Signed)
  Echocardiogram 2D Echocardiogram has been performed  Jeffery Gibson 11/16/2013, 5:46 PM

## 2013-11-16 NOTE — Consult Note (Signed)
PULMONARY / CRITICAL CARE MEDICINE   Name: Jeffery Gibson MRN: [id number] DOB: 1928-07-11    ADMISSION DATE:  11/15/2013 CONSULTATION DATE:  11/15/13  REFERRING MD :  Roland Rack, MD PRIMARY SERVICE: Neurology  CHIEF COMPLAINT:  Right MCA stroke post systemic and catheter directed TPA.  BRIEF PATIENT DESCRIPTION:  80 years old male with PMH relevant for CAD post CABG, A. Fib not on anticoagulation due to GI bleed, pacemaker, HTN, DM, CKD. Presents with left sided weakness and found to have a right MCA stroke. Underwent systemic and catheter directed TPA. Now in the Neuro ICU intubated. PCCM consult called to assist with vent management.   SIGNIFICANT EVENTS / STUDIES:  Initial CT of the head: 1. Right MCA thrombus at the M1/M2 junction. No ischemic changes  currently visible.  2. No acute intracranial hemorrhage.  3. Brain atrophy and chronic small vessel disease.    LINES / TUBES: - Peripheral IV's - ETT - Foley catheter  CULTURES:   ANTIBIOTICS:   HISTORY OF PRESENT ILLNESS:   80 years old male with PMH relevant for CAD post CABG, A. Fib not on anticoagulation due to GI bleed, pacemaker, HTN, DM, CKD. Presents with left sided weakness and found to have a right MCA stroke. Underwent systemic and catheter directed TPA. Now in the Neuro ICU intubated. PCCM consult called to assist with vent management. At the time of my examination the patient is intubated, sedated with propofol, saturating 100% on minimal vent settings and hemodynamically stable.   PAST MEDICAL HISTORY :  Past Medical History  Diagnosis Date  . Ischemic heart disease 05/06/06    post CARG 05/06/06  . Obese     exogenous  . Renal insufficiency   . Anemia of chronic disease     aranesp injections  . Dyslipidemia   . Coronary artery disease   . Anemia associated with chronic renal failure 04/05/2011  . Cancer     prostate/on Lupron  . Adenocarcinoma of colon 11/2003    stage 2(T3,N0,M0)  .  Diabetes mellitus   . Hypertension   . Anginal pain   . Myocardial infarction 1986  . Pacemaker   . Cataracts, bilateral   . Seizures   . Arthritis   . Hemorrhoids   . GERD (gastroesophageal reflux disease)     hx of  . Hematoma     hx of  . Atrial flutter     afib and atrial flutter (permanent)  . Tachycardia-bradycardia 1997    dual-chamber/for tachybradycardia syndrome  . Sleep apnea     no cpap   Past Surgical History  Procedure Laterality Date  . Laparotomy  12/04/2003    resection of rectosigmoid carcinoma/  . Radioactive seed implant  05/2005    transperianeal placement I-125 for prostate cancer/# of  seeds 55  . Cardiac catheterization  05/01/06    EF 40%/diffuse 3 vessel CAD/tight L antereior descending artery stenosis/diffuse disease proximal L anterior descending  . Pacemaker removal  1997  . Fracture surgery      left ankle  . Insert / replace / remove pacemaker  2006  . Colon surgery  2005  . Coronary artery bypass graft  05/06/06    x4 with L internal mammary artery to the L anterior descending coronary artery  . Laser surgery  2012    prostate  . Orchiectomy  august 2013  . Cystoscopy w/ ureteral stent placement Bilateral 08/04/2012    Procedure: CYSTOSCOPY WITH bilateral retrograde;  Surgeon:  Dutch Gray, MD;  Location: [facility];  Service: Urology;  Laterality: Bilateral;  attempted stent placement   . Peripherally inserted central catheter insertion  10-30-12    right upper arm-Antibiotic IV "urinary pseudomonas"  . Cystoscopy w/ ureteral stent placement Bilateral 11/03/2012    Procedure: CYSTOSCOPY WITH STENT REPLACEMENT;  Surgeon: Dutch Gray, MD;  Location: [facility];  Service: Urology;  Laterality: Bilateral;   Prior to Admission medications   Medication Sig Start Date End Date Taking? Authorizing Provider  acetaminophen (TYLENOL) 500 MG tablet Take 500 mg by mouth every 6 (six) hours as needed for pain or fever.   Yes Historical Provider, MD  amiodarone  (PACERONE) 200 MG tablet Take 100 mg by mouth daily.   Yes Historical Provider, MD  amLODipine (NORVASC) 5 MG tablet Take 5 mg by mouth every morning.   Yes Historical Provider, MD  Aromatic Inhalants (VICKS VAPOR INHALER IN) Inhale 1 puff into the lungs daily as needed (for dry air).   Yes Historical Provider, MD  aspirin EC 81 MG tablet Take 81 mg by mouth every evening.    Yes Historical Provider, MD  Calcium Carbonate (CALCIUM 600 PO) Take 1,200 mg by mouth daily with lunch.   Yes Historical Provider, MD  Cholecalciferol (VITAMIN D) 2000 UNITS tablet Take 2,000 Units by mouth daily at 12 noon.    Yes Historical Provider, MD  Darbepoetin Alfa-Albumin (ARANESP IJ) Inject 1 Syringe as directed See admin instructions. Every 4 weeks as needed for anemia if hemoglobin is below 11   Yes Historical Provider, MD  enzalutamide (XTANDI) 40 MG capsule Take 160 mg by mouth every morning.    Yes Historical Provider, MD  finasteride (PROSCAR) 5 MG tablet Take 5 mg by mouth every evening.    Yes Historical Provider, MD  furosemide (LASIX) 40 MG tablet Take 20 mg by mouth as directed.    Yes Historical Provider, MD  insulin glargine (LANTUS) 100 UNIT/ML injection 10 units daily or as directed 03/16/13  Yes Darlin Coco, MD  mirabegron ER (MYRBETRIQ) 50 MG TB24 tablet Take 50 mg by mouth at bedtime.   Yes Historical Provider, MD  omeprazole (PRILOSEC) 20 MG capsule Take 20 mg by mouth at bedtime.   Yes Historical Provider, MD  PRESCRIPTION MEDICATION Take 1 tablet by mouth 2 (two) times daily. Metoprolol   Yes Historical Provider, MD  rosuvastatin (CRESTOR) 10 MG tablet Take 5 mg by mouth every evening.  02/20/12  Yes Thayer Headings, MD  ursodiol (ACTIGALL) 300 MG capsule Take 300 mg by mouth 2 (two) times daily.    Yes Historical Provider, MD  vitamin C (ASCORBIC ACID) 500 MG tablet Take 500 mg by mouth daily.   Yes Historical Provider, MD   Allergies  Allergen Reactions  . Adhesive [Tape] Rash    Rash  and itching. USE PAPER TAPE ONLY  . Latex Hives  . Lovenox [Enoxaparin Sodium]     Gi bleed  . Lupron [Leuprolide Acetate]     Kidney failure  . Warfarin And Related     Gi bleed    FAMILY HISTORY:  Family History  Problem Relation Age of Onset  . Heart failure Father 14  . Gallbladder disease Father   . Cancer Son     glioblastoma   SOCIAL HISTORY:  reports that he quit smoking about 50 years ago. His smoking use included Cigarettes. He smoked 0.00 packs per day for 10 years. He has never used smokeless tobacco. He reports  that he does not drink alcohol or use illicit drugs.  REVIEW OF SYSTEMS: Unable to provide  SUBJECTIVE:   VITAL SIGNS: Temp:  [97.5 F (36.4 C)] 97.5 F (36.4 C) (06/28 2230) Pulse Rate:  [59-92] 66 (06/29 0223) Resp:  [12-20] 12 (06/29 0223) BP: (144-199)/(53-83) 199/83 mmHg (06/29 0223) SpO2:  [92 %-100 %] 100 % (06/29 0223) FiO2 (%):  [50 %] 50 % (06/29 0223) HEMODYNAMICS:   VENTILATOR SETTINGS: Vent Mode:  [-] PRVC FiO2 (%):  [50 %] 50 % Set Rate:  [12 bmp] 12 bmp Vt Set:  [650 mL] 650 mL PEEP:  [5 cmH20] 5 cmH20 Plateau Pressure:  [20 cmH20] 20 cmH20 INTAKE / OUTPUT: Intake/Output     06/28 0701 - 06/29 0700   I.V. 1300   Total Intake 1300   Urine 350   Total Output 350   Net +950         PHYSICAL EXAMINATION: General: Sedated, intubated, no acute distress. Eyes: Anicteric sclerae. Pupils are equal and reactive to light ENT: ETT in place. Trachea at midline.  Lymph: No cervical, supraclavicular, or axillary lymphadenopathy. Heart: Normal S1, S2. No murmurs, rubs, or gallops appreciated. No bruits, equal pulses. Lungs: Normal excursion, no dullness to percussion. Good air movement bilaterally, without wheezes or crackles.  Abdomen: Abdomen soft, non-tender and not distended, normoactive bowel sounds. No hepatosplenomegaly or masses. Musculoskeletal: No clubbing or synovitis. No LE edema Skin: No rashes or lesions Neuro: Patient  is unresponsive. Under sedation with propofol. Unable to perform full neurological exam.    LABS:  CBC  Recent Labs Lab 11/15/13 2117 11/15/13 2125  WBC 7.3  --   HGB 11.1* 11.2*  HCT 32.4* 33.0*  PLT 175  --    Coag's  Recent Labs Lab 11/15/13 2117  APTT 24  INR 0.97   BMET  Recent Labs Lab 11/15/13 2117 11/15/13 2125  NA 137 134*  K 4.6 4.4  CL 98 102  CO2 21  --   BUN 42* 38*  CREATININE 2.03* 2.30*  GLUCOSE 152* 153*   Electrolytes  Recent Labs Lab 11/15/13 2117  CALCIUM 9.6   Sepsis Markers No results found for this basename: LATICACIDVEN, PROCALCITON, O2SATVEN,  in the last 168 hours ABG No results found for this basename: PHART, PCO2ART, PO2ART,  in the last 168 hours Liver Enzymes  Recent Labs Lab 11/15/13 2117  AST 10  ALT <5  ALKPHOS 94  BILITOT 0.3  ALBUMIN 3.2*   Cardiac Enzymes No results found for this basename: TROPONINI, PROBNP,  in the last 168 hours Glucose  Recent Labs Lab 11/15/13 2131  GLUCAP 170*    Imaging Ct Angio Head W/cm &/or Wo Cm  11/15/2013   CLINICAL DATA:  Code stroke, left facial droop and weakness. History of prostate cancer.  EXAM: CT ANGIOGRAPHY HEAD AND NECK  TECHNIQUE: Multidetector CT imaging of the head and neck was performed using the standard protocol during bolus administration of intravenous contrast. Multiplanar CT image reconstructions and MIPs were obtained to evaluate the vascular anatomy. Carotid stenosis measurements (when applicable) are obtained utilizing NASCET criteria, using the distal internal carotid diameter as the denominator.  CONTRAST:  16mL OMNIPAQUE IOHEXOL 350 MG/ML SOLN  COMPARISON:  CT of the head November 15, 2013 at 2123 hr.  FINDINGS: CTA HEAD FINDINGS  Anterior circulation: Normal appearance of the cervical internal carotid arteries, petrous, cavernous and supra clinoid internal carotid arteries ; moderate calcific atherosclerosis. At the level of the right middle cerebral  artery  bifurcation there is complete occlusion of a right M2 branch (likely inferior branch). Due to decreased profusion, there is delayed enhancement of the right cortical veins. Widely patent anterior communicating artery. Normal appearance of the anterior cerebral arteries.  Posterior circulation: Codominant vertebral arteries, with normal appearance of the vertebral arteries, vertebrobasilar junction and basilar artery, as well as main branch vessels. Small bilateral posterior communicating arteries are present. Normal appearance of the posterior cerebral arteries.  No hemodynamically significant stenosis, dissection, luminal irregularity, contrast extravasation or aneurysm within the anterior nor posterior circulation.  Postcontrast imaging of the brain demonstrates no enhancing intracranial lesions. No intraparenchymal hemorrhage.  Review of the MIP images confirms the above findings.  CTA NECK FINDINGS  Normal appearance of the thoracic arch, normal branch pattern. Moderate calcific atherosclerosis. The origins of the innominate, left Common carotid artery and subclavian artery are widely patent. Streak artifact from dental amalgam limits evaluation of the cervical vessels at skull base.  Bilateral Common carotid arteries are widely patent, coursing in a straight line fashion. Medial to that course of the left Common carotid artery, normal variant and could be transient. 1-2 mm eccentric calcific atherosclerosis of the carotid bulbs extending into the internal carotid arteries without hemodynamically significant stenosis by NASCET criteria. Normal appearance of the included internal carotid arteries.  Left vertebral artery is dominant. Normal appearance of the vertebral arteries, which appear widely patent.  No hemodynamically significant stenosis by NASCET criteria. No dissection, no pseudoaneurysm. No abnormal luminal irregularity. No contrast extravasation.  Thyromegaly with partially calcified multi lobulated  heterogeneous left thyroid mass measuring at least 6.6 x 4.8 x 3.8 cm with substernal extent, at displacing the trachea to the right, splaying the trachea and aorta. Multiple sclerotic lesions in the ribs and vertebral bodies concerning for metastatic disease. Status post median sternotomy.  Review of the MIP images confirms the above findings.  IMPRESSION: CTA head: Acute right M2 occlusive thromboembolism, as seen on prior CT.  CTA neck: No hemodynamically significant stenosis.  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  6.6 x 4.8 x 3.8 cm suspicious left thyroid mass displaces the trachea to the right, recommend follow-up thyroid sonogram on a nonemergent basis.  Preliminary findings discussed by Dr. Eustace Quail, Radiology and reconfirmed by Dr.MCNEILL [facility] on6/28/2015atat approximately 2230 hr.   Electronically Signed   By: Elon Alas   On: 11/15/2013 23:23   Ct Head (brain) Wo Contrast  11/15/2013   CLINICAL DATA:  Code stroke.  Left-sided facial droop and weakness.  EXAM: CT HEAD WITHOUT CONTRAST  TECHNIQUE: Contiguous axial images were obtained from the base of the skull through the vertex without intravenous contrast.  COMPARISON:  None.  FINDINGS: Skull and Sinuses:Negative for fracture or destructive process. Smoothly contoured opacity in the left sphenoid sinus compatible with mucous retention cyst or less likely polyp.  Orbits: No acute abnormality.  Brain: There is MCA high-density at the level of the M1 M2 junction which given the history is consistent with thrombus. No ischemic changes are seen in the right MCA territory currently. No acute hemorrhage. There is chronic small vessel disease with ischemic gliosis around the lateral ventricles. Generalized brain atrophy. No evidence of mass lesion, hydrocephalus, or shift.  Critical Value/emergent results were called by telephone at the time of interpretation on 11/15/2013 at 10:07 PM to Dr. Leonel Ramsay, who verbally  acknowledged these results.  IMPRESSION: 1. Right MCA thrombus at the M1/M2 junction. No ischemic changes currently visible. 2. No acute  intracranial hemorrhage. 3. Brain atrophy and chronic small vessel disease.   Electronically Signed   By: Jorje Guild M.D.   On: 11/15/2013 22:10   Ct Angio Neck W/cm &/or Wo/cm  11/15/2013   CLINICAL DATA:  Code stroke, left facial droop and weakness. History of prostate cancer.  EXAM: CT ANGIOGRAPHY HEAD AND NECK  TECHNIQUE: Multidetector CT imaging of the head and neck was performed using the standard protocol during bolus administration of intravenous contrast. Multiplanar CT image reconstructions and MIPs were obtained to evaluate the vascular anatomy. Carotid stenosis measurements (when applicable) are obtained utilizing NASCET criteria, using the distal internal carotid diameter as the denominator.  CONTRAST:  77mL OMNIPAQUE IOHEXOL 350 MG/ML SOLN  COMPARISON:  CT of the head November 15, 2013 at 2123 hr.  FINDINGS: CTA HEAD FINDINGS  Anterior circulation: Normal appearance of the cervical internal carotid arteries, petrous, cavernous and supra clinoid internal carotid arteries ; moderate calcific atherosclerosis. At the level of the right middle cerebral artery bifurcation there is complete occlusion of a right M2 branch (likely inferior branch). Due to decreased profusion, there is delayed enhancement of the right cortical veins. Widely patent anterior communicating artery. Normal appearance of the anterior cerebral arteries.  Posterior circulation: Codominant vertebral arteries, with normal appearance of the vertebral arteries, vertebrobasilar junction and basilar artery, as well as main branch vessels. Small bilateral posterior communicating arteries are present. Normal appearance of the posterior cerebral arteries.  No hemodynamically significant stenosis, dissection, luminal irregularity, contrast extravasation or aneurysm within the anterior nor posterior  circulation.  Postcontrast imaging of the brain demonstrates no enhancing intracranial lesions. No intraparenchymal hemorrhage.  Review of the MIP images confirms the above findings.  CTA NECK FINDINGS  Normal appearance of the thoracic arch, normal branch pattern. Moderate calcific atherosclerosis. The origins of the innominate, left Common carotid artery and subclavian artery are widely patent. Streak artifact from dental amalgam limits evaluation of the cervical vessels at skull base.  Bilateral Common carotid arteries are widely patent, coursing in a straight line fashion. Medial to that course of the left Common carotid artery, normal variant and could be transient. 1-2 mm eccentric calcific atherosclerosis of the carotid bulbs extending into the internal carotid arteries without hemodynamically significant stenosis by NASCET criteria. Normal appearance of the included internal carotid arteries.  Left vertebral artery is dominant. Normal appearance of the vertebral arteries, which appear widely patent.  No hemodynamically significant stenosis by NASCET criteria. No dissection, no pseudoaneurysm. No abnormal luminal irregularity. No contrast extravasation.  Thyromegaly with partially calcified multi lobulated heterogeneous left thyroid mass measuring at least 6.6 x 4.8 x 3.8 cm with substernal extent, at displacing the trachea to the right, splaying the trachea and aorta. Multiple sclerotic lesions in the ribs and vertebral bodies concerning for metastatic disease. Status post median sternotomy.  Review of the MIP images confirms the above findings.  IMPRESSION: CTA head: Acute right M2 occlusive thromboembolism, as seen on prior CT.  CTA neck: No hemodynamically significant stenosis.  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  6.6 x 4.8 x 3.8 cm suspicious left thyroid mass displaces the trachea to the right, recommend follow-up thyroid sonogram on a nonemergent basis.  Preliminary  findings discussed by Dr. Eustace Quail, Radiology and reconfirmed by Dr.MCNEILL [facility] on6/28/2015atat approximately 2230 hr.   Electronically Signed   By: Elon Alas   On: 11/15/2013 23:23     CXR: Will order  ASSESSMENT: 80 years old male with PMH relevant for  CAD post CABG, A. Fib not on anticoagulation due to GI bleed, pacemaker, HTN, DM, CKD. Presents with left sided weakness and found to have a right MCA stroke. Underwent systemic and catheter directed TPA. Now in the Neuro ICU intubated. PCCM consult called to assist with vent management.   RECOMMENDATIONS: - Mechanical ventilation   - PRVC, Vt: 8cc/kg, PEEP: 5, RR: 16, FiO2: 100% and adjust to keep O2 sat > 94%   - VAP prevention order set   - Daily awakening and SBT - GI prophylaxis with IV protonix. - Albuterol nebs PRN - Sedation with propofol - PRN fentanyl - BP management per primary team - Novolog sliding scale   I have personally obtained a history, examined the patient, evaluated laboratory and imaging results, formulated the assessment and plan and placed orders.  CRITICAL CARE: Critical Care Time devoted to patient care services described in this note is 45 minutes.   Waynetta Pean, MD Pulmonary and [place] Pager: [phone]  11/16/2013, 2:54 AM

## 2013-11-16 NOTE — Progress Notes (Addendum)
1 Day Post-Op  Subjective: CVA 6/28 R MCA revacsularization; IA tpa/clot retieval 6/29 early am On vent B groin sheaths in place Moving all 4s to command  Objective: Vital signs in last 24 hours: Temp:  [97.2 F (36.2 C)-99.1 F (37.3 C)] 97.2 F (36.2 C) (06/29 0800) Pulse Rate:  [34-92] 34 (06/29 0800) Resp:  [12-23] 22 (06/29 0800) BP: (97-199)/(49-83) 117/52 mmHg (06/29 0800) SpO2:  [92 %-100 %] 100 % (06/29 0800) Arterial Line BP: (139-205)/(53-79) 139/54 mmHg (06/29 0800) FiO2 (%):  [40 %-50 %] 40 % (06/29 0808) Weight:  [98.3 kg (216 lb 11.4 oz)] 98.3 kg (216 lb 11.4 oz) (06/29 0223)    Intake/Output from previous day: 06/28 0701 - 06/29 0700 In: 2265.5 [I.V.:2265.5] Out: 1000 [Urine:1000] Intake/Output this shift: Total I/O In: 242 [I.V.:242] Out: 125 [Urine:125]  PE:  Afeb; on vent VSS Sedated Tries to communicate; seems slightly agitated with vent Does move both hands/arms to chest Squeezes both hands Moves feet to command Able to bend Rt knee- not left B groin sheaths intact Both sites with some oozing - mild No hematomas Feet 2+ pulses Labs: Bun/Cr getting better H/H stable INR wnl UOP good  Lab Results:   Recent Labs  11/15/13 2117 11/15/13 2125 11/16/13 0350  WBC 7.3  --  8.9  HGB 11.1* 11.2* 9.6*  HCT 32.4* 33.0* 28.2*  PLT 175  --  145*   BMET  Recent Labs  11/15/13 2117 11/15/13 2125 11/16/13 0350  NA 137 134* 135*  K 4.6 4.4 4.6  CL 98 102 99  CO2 21  --  19  GLUCOSE 152* 153* 242*  BUN 42* 38* 38*  CREATININE 2.03* 2.30* 1.77*  CALCIUM 9.6  --  8.3*   PT/INR  Recent Labs  11/15/13 2117  LABPROT 12.9  INR 0.97   ABG No results found for this basename: PHART, PCO2, PO2, HCO3,  in the last 72 hours  Studies/Results: Ct Angio Head W/cm &/or Wo Cm  11/15/2013   CLINICAL DATA:  Code stroke, left facial droop and weakness. History of prostate cancer.  EXAM: CT ANGIOGRAPHY HEAD AND NECK  TECHNIQUE: Multidetector CT  imaging of the head and neck was performed using the standard protocol during bolus administration of intravenous contrast. Multiplanar CT image reconstructions and MIPs were obtained to evaluate the vascular anatomy. Carotid stenosis measurements (when applicable) are obtained utilizing NASCET criteria, using the distal internal carotid diameter as the denominator.  CONTRAST:  64mL OMNIPAQUE IOHEXOL 350 MG/ML SOLN  COMPARISON:  CT of the head November 15, 2013 at 2123 hr.  FINDINGS: CTA HEAD FINDINGS  Anterior circulation: Normal appearance of the cervical internal carotid arteries, petrous, cavernous and supra clinoid internal carotid arteries ; moderate calcific atherosclerosis. At the level of the right middle cerebral artery bifurcation there is complete occlusion of a right M2 branch (likely inferior branch). Due to decreased profusion, there is delayed enhancement of the right cortical veins. Widely patent anterior communicating artery. Normal appearance of the anterior cerebral arteries.  Posterior circulation: Codominant vertebral arteries, with normal appearance of the vertebral arteries, vertebrobasilar junction and basilar artery, as well as main branch vessels. Small bilateral posterior communicating arteries are present. Normal appearance of the posterior cerebral arteries.  No hemodynamically significant stenosis, dissection, luminal irregularity, contrast extravasation or aneurysm within the anterior nor posterior circulation.  Postcontrast imaging of the brain demonstrates no enhancing intracranial lesions. No intraparenchymal hemorrhage.  Review of the MIP images confirms the above findings.  CTA NECK FINDINGS  Normal appearance of the thoracic arch, normal branch pattern. Moderate calcific atherosclerosis. The origins of the innominate, left Common carotid artery and subclavian artery are widely patent. Streak artifact from dental amalgam limits evaluation of the cervical vessels at skull base.   Bilateral Common carotid arteries are widely patent, coursing in a straight line fashion. Medial to that course of the left Common carotid artery, normal variant and could be transient. 1-2 mm eccentric calcific atherosclerosis of the carotid bulbs extending into the internal carotid arteries without hemodynamically significant stenosis by NASCET criteria. Normal appearance of the included internal carotid arteries.  Left vertebral artery is dominant. Normal appearance of the vertebral arteries, which appear widely patent.  No hemodynamically significant stenosis by NASCET criteria. No dissection, no pseudoaneurysm. No abnormal luminal irregularity. No contrast extravasation.  Thyromegaly with partially calcified multi lobulated heterogeneous left thyroid mass measuring at least 6.6 x 4.8 x 3.8 cm with substernal extent, at displacing the trachea to the right, splaying the trachea and aorta. Multiple sclerotic lesions in the ribs and vertebral bodies concerning for metastatic disease. Status post median sternotomy.  Review of the MIP images confirms the above findings.  IMPRESSION: CTA head: Acute right M2 occlusive thromboembolism, as seen on prior CT.  CTA neck: No hemodynamically significant stenosis.  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  6.6 x 4.8 x 3.8 cm suspicious left thyroid mass displaces the trachea to the right, recommend follow-up thyroid sonogram on a nonemergent basis.  Preliminary findings discussed by Dr. Eustace Quail, Radiology and reconfirmed by Dr.MCNEILL [facility] on6/28/2015atat approximately 2230 hr.   Electronically Signed   By: Elon Alas   On: 11/15/2013 23:23   Ct Head Wo Contrast  11/16/2013   CLINICAL DATA:  Status post intervention.  EXAM: CT HEAD WITHOUT CONTRAST  TECHNIQUE: Contiguous axial images were obtained from the base of the skull through the vertex without intravenous contrast.  COMPARISON:  Head CT from yesterday  FINDINGS: There is new  nasopharyngeal fluid related to intubation. No acute osseous findings have developed.  There is new serpentine high density in the lower sylvian fissure and neighboring sulci. This appears sulcal rather than cortical. No discrete hematoma. No visible infarct. No hydrocephalus or shift. Generalized brain atrophy and chronic vessel disease is again noted.  These results were called by telephone at the time of interpretation on 11/16/2013 at 2:55 AM to Dr. Leonel Ramsay, who verbally acknowledged these results.  IMPRESSION: 1. Subarachnoid high attenuation along the right sylvian fissure which is often contrast staining, but cannot exclude subarachnoid hemorrhage. Staining should dissipated on short followup head CT. 2. No visible infarct.   Electronically Signed   By: Jorje Guild M.D.   On: 11/16/2013 02:59   Ct Head (brain) Wo Contrast  11/15/2013   CLINICAL DATA:  Code stroke.  Left-sided facial droop and weakness.  EXAM: CT HEAD WITHOUT CONTRAST  TECHNIQUE: Contiguous axial images were obtained from the base of the skull through the vertex without intravenous contrast.  COMPARISON:  None.  FINDINGS: Skull and Sinuses:Negative for fracture or destructive process. Smoothly contoured opacity in the left sphenoid sinus compatible with mucous retention cyst or less likely polyp.  Orbits: No acute abnormality.  Brain: There is MCA high-density at the level of the M1 M2 junction which given the history is consistent with thrombus. No ischemic changes are seen in the right MCA territory currently. No acute hemorrhage. There is chronic small vessel disease with ischemic gliosis around  the lateral ventricles. Generalized brain atrophy. No evidence of mass lesion, hydrocephalus, or shift.  Critical Value/emergent results were called by telephone at the time of interpretation on 11/15/2013 at 10:07 PM to Dr. Leonel Ramsay, who verbally acknowledged these results.  IMPRESSION: 1. Right MCA thrombus at the M1/M2 junction. No  ischemic changes currently visible. 2. No acute intracranial hemorrhage. 3. Brain atrophy and chronic small vessel disease.   Electronically Signed   By: Jorje Guild M.D.   On: 11/15/2013 22:10   Ct Angio Neck W/cm &/or Wo/cm  11/15/2013   CLINICAL DATA:  Code stroke, left facial droop and weakness. History of prostate cancer.  EXAM: CT ANGIOGRAPHY HEAD AND NECK  TECHNIQUE: Multidetector CT imaging of the head and neck was performed using the standard protocol during bolus administration of intravenous contrast. Multiplanar CT image reconstructions and MIPs were obtained to evaluate the vascular anatomy. Carotid stenosis measurements (when applicable) are obtained utilizing NASCET criteria, using the distal internal carotid diameter as the denominator.  CONTRAST:  62mL OMNIPAQUE IOHEXOL 350 MG/ML SOLN  COMPARISON:  CT of the head November 15, 2013 at 2123 hr.  FINDINGS: CTA HEAD FINDINGS  Anterior circulation: Normal appearance of the cervical internal carotid arteries, petrous, cavernous and supra clinoid internal carotid arteries ; moderate calcific atherosclerosis. At the level of the right middle cerebral artery bifurcation there is complete occlusion of a right M2 branch (likely inferior branch). Due to decreased profusion, there is delayed enhancement of the right cortical veins. Widely patent anterior communicating artery. Normal appearance of the anterior cerebral arteries.  Posterior circulation: Codominant vertebral arteries, with normal appearance of the vertebral arteries, vertebrobasilar junction and basilar artery, as well as main branch vessels. Small bilateral posterior communicating arteries are present. Normal appearance of the posterior cerebral arteries.  No hemodynamically significant stenosis, dissection, luminal irregularity, contrast extravasation or aneurysm within the anterior nor posterior circulation.  Postcontrast imaging of the brain demonstrates no enhancing intracranial lesions. No  intraparenchymal hemorrhage.  Review of the MIP images confirms the above findings.  CTA NECK FINDINGS  Normal appearance of the thoracic arch, normal branch pattern. Moderate calcific atherosclerosis. The origins of the innominate, left Common carotid artery and subclavian artery are widely patent. Streak artifact from dental amalgam limits evaluation of the cervical vessels at skull base.  Bilateral Common carotid arteries are widely patent, coursing in a straight line fashion. Medial to that course of the left Common carotid artery, normal variant and could be transient. 1-2 mm eccentric calcific atherosclerosis of the carotid bulbs extending into the internal carotid arteries without hemodynamically significant stenosis by NASCET criteria. Normal appearance of the included internal carotid arteries.  Left vertebral artery is dominant. Normal appearance of the vertebral arteries, which appear widely patent.  No hemodynamically significant stenosis by NASCET criteria. No dissection, no pseudoaneurysm. No abnormal luminal irregularity. No contrast extravasation.  Thyromegaly with partially calcified multi lobulated heterogeneous left thyroid mass measuring at least 6.6 x 4.8 x 3.8 cm with substernal extent, at displacing the trachea to the right, splaying the trachea and aorta. Multiple sclerotic lesions in the ribs and vertebral bodies concerning for metastatic disease. Status post median sternotomy.  Review of the MIP images confirms the above findings.  IMPRESSION: CTA head: Acute right M2 occlusive thromboembolism, as seen on prior CT.  CTA neck: No hemodynamically significant stenosis.  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  6.6 x 4.8 x 3.8 cm suspicious left thyroid mass displaces the trachea to the  right, recommend follow-up thyroid sonogram on a nonemergent basis.  Preliminary findings discussed by Dr. Eustace Quail, Radiology and reconfirmed by Dr.MCNEILL [facility] on6/28/2015atat  approximately 2230 hr.   Electronically Signed   By: Elon Alas   On: 11/15/2013 23:23   Dg Chest Port 1 View  11/16/2013   CLINICAL DATA:  Recent stroke  EXAM: PORTABLE CHEST - 1 VIEW  COMPARISON:  08/19/2012  FINDINGS: An endotracheal tube is noted 6 cm above the carina. Nasogastric catheter is noted coiled within the stomach. A pacing device is noted on the right and stable. Postoperative changes are seen as well. Cardiac shadow is stable. Left basilar atelectatic changes are noted. No focal confluent infiltrate is seen. No bony abnormality is noted.  IMPRESSION: Left basilar atelectasis.  Tubes and lines as described.   Electronically Signed   By: Inez Catalina M.D.   On: 11/16/2013 08:01    Anti-infectives: Anti-infectives   None      Assessment/Plan: s/p Procedure(s): RADIOLOGY WITH ANESTHESIA (N/A)  CVA last night Early am IR intervention Rt MCA revascularization; IA tpa/clot retrieval B sheaths intact--for removal today Follow- Plan per stroke team Extubating now   LOS: 1 day    TURPIN,PAMELA A 11/16/2013

## 2013-11-16 NOTE — Procedures (Signed)
Name: Jeffery Gibson MRN: [id number] DOB: Oct 28, 1928   PROCEDURE NOTE  Procedure:  Endotracheal intubation.  Indication:  Acute respiratory failure  Consent:  Consent was implied due to the emergency nature of the procedure.  Anesthesia:  A total of 10 mg of Etomidate was given intravenously.  Procedure summary:  Appropriate equipment was assembled. The patient was identified as [name] and safety timeout was performed. The patient was placed supine, with head in sniffing position. After adequate level of anesthesia was achieved, Mac 3 blade was inserted into the oropharynx and the vocal cords were visualized. A 7.5 endotracheal tube was inserted without difficulty and visualized going through the vocal cords. The stylette was removed and cuff inflated. Colorimetric change was noted on the CO2 meter. Breath sounds were heard over both lung fields equally. ETT was secured at 23 cm lip line.  Post procedure chest xray was ordered.  Complications:  No immediate complications were noted.  Hemodynamic parameters and oxygenation remained stable throughout the procedure.    Penne Lash, M.D. Pul[facility] and [place] Pager: [phone]  11/16/2013, 9:43 AM

## 2013-11-16 NOTE — Anesthesia Postprocedure Evaluation (Signed)
  Anesthesia Post-op Note  Patient: Jeffery Gibson  Procedure(s) Performed: Procedure(s): RADIOLOGY WITH ANESTHESIA (N/A)  Patient Location: ICU  Anesthesia Type:General  Level of Consciousness: sedated and unresponsive  Airway and Oxygen Therapy: Patient remains intubated and on ventilator  Post-op Pain: none  Post-op Assessment: Post-op Vital signs reviewed, Patient's Cardiovascular Status Stable and Respiratory Function Stable  Post-op Vital Signs: Reviewed  Filed Vitals:   11/16/13 0325  BP: 195/73  Pulse: 60  Temp:   Resp: 20    Complications: No apparent anesthesia complications

## 2013-11-16 NOTE — Telephone Encounter (Signed)
Will forward to  Dr. Brackbill  

## 2013-11-16 NOTE — Progress Notes (Signed)
Recanalization time in IR was 0047

## 2013-11-16 NOTE — Progress Notes (Signed)
INITIAL NUTRITION ASSESSMENT  DOCUMENTATION CODES Per approved criteria  -Obesity Unspecified   INTERVENTION: Initiate Vital High Protein @ 30 ml/hr via OG tube.   60 ml Prostat TID.    MVI daily  Tube feeding regimen provides 1320 kcal, 153 grams of protein, and 601 ml of H2O.   TF and Propofol provides: 1636 total kcal (23 kcal/kg IBW)  NUTRITION DIAGNOSIS: Inadequate oral intake related to inability to eat as evidenced by NPO status  Goal: Enteral nutrition to provide 60-70% of estimated calorie needs (22-25 kcals/kg ideal body weight) and 100% of estimated protein needs, based on ASPEN guidelines for permissive underfeeding in critically ill obese individuals  Monitor:  Respiratory status, TF initiation and tolerance, labs  Reason for Assessment: Consult received to initiate and manage enteral nutrition support.  80 y.o. male  Admitting Dx: <principal problem not specified>  ASSESSMENT: Pt admitted with R MCA CVA s/p systemic/catheter tPA. Pt failed extubation this am, was re-intubted, per MD pt will likely need early trach.   No family present.  Nutrition-focused physical exam does not identify any fat or muscle depletion.  Sodium low, glucose elevated.  OG tube in place, per xray tip in gastric cardia.    Patient is currently intubated on ventilator support MV: 10 L/min Temp (24hrs), Avg:97.8 F (36.6 C), Min:97.2 F (36.2 C), Max:99.1 F (37.3 C)  Propofol: 12 ml/hr provides 316 kcal per day from lipid  Height: Ht Readings from Last 1 Encounters:  11/16/13 5\' 8"  (1.727 m)    Weight: Wt Readings from Last 1 Encounters:  11/16/13 223 lb 8.7 oz (101.4 kg)    Ideal Body Weight: 70 kg   % Ideal Body Weight: 145%  Wt Readings from Last 10 Encounters:  11/16/13 223 lb 8.7 oz (101.4 kg)  11/16/13 223 lb 8.7 oz (101.4 kg)  10/08/13 216 lb 11.2 oz (98.294 kg)  06/24/13 228 lb (103.42 kg)  03/17/13 242 lb 3.2 oz (109.861 kg)  12/09/12 242 lb (109.77  kg)  10/30/12 240 lb 2 oz (108.92 kg)  10/22/12 240 lb 6.4 oz (109.045 kg)  09/11/12 238 lb (107.956 kg)  09/09/12 238 lb (107.956 kg)    Usual Body Weight: 220 lb   % Usual Body Weight: 100%  BMI:  Body mass index is 34 kg/(m^2).  Estimated Nutritional Needs: Kcal: 1868 Protein: >/= 140 grams Fluid: >2 L/day  Skin: intact  Diet Order: NPO  EDUCATION NEEDS: -No education needs identified at this time   Intake/Output Summary (Last 24 hours) at 11/16/13 1448 Last data filed at 11/16/13 1300  Gross per 24 hour  Intake 3063.14 ml  Output   1345 ml  Net 1718.14 ml    Last BM: PTA   Labs:   Recent Labs Lab 11/15/13 2117 11/15/13 2125 11/16/13 0350  NA 137 134* 135*  K 4.6 4.4 4.6  CL 98 102 99  CO2 21  --  19  BUN 42* 38* 38*  CREATININE 2.03* 2.30* 1.77*  CALCIUM 9.6  --  8.3*  GLUCOSE 152* 153* 242*    CBG (last 3)   Recent Labs  11/16/13 0332 11/16/13 0759 11/16/13 1219  GLUCAP 200* 230* 168*    Scheduled Meds: . amiodarone  100 mg Oral Daily  . antiseptic oral rinse  15 mL Mouth Rinse q12n4p  . atorvastatin  10 mg Oral q1800  . atropine      . chlorhexidine  15 mL Mouth Rinse BID  . finasteride  5 mg  Oral QPM  . insulin aspart  0-15 Units Subcutaneous 6 times per day  . pantoprazole (PROTONIX) IV  40 mg Intravenous QHS  . ursodiol  300 mg Oral BID    Continuous Infusions: . sodium chloride    . niCARDipine 5 mg/hr (11/16/13 1400)  . propofol 20 mcg/kg/min (11/16/13 1440)    Past Medical History  Diagnosis Date  . Ischemic heart disease 05/06/06    post CARG 05/06/06  . Obese     exogenous  . Renal insufficiency   . Anemia of chronic disease     aranesp injections  . Dyslipidemia   . Coronary artery disease   . Anemia associated with chronic renal failure 04/05/2011  . Cancer     prostate/on Lupron  . Adenocarcinoma of colon 11/2003    stage 2(T3,N0,M0)  . Diabetes mellitus   . Hypertension   . Anginal pain   .  Myocardial infarction 1986  . Pacemaker   . Cataracts, bilateral   . Seizures   . Arthritis   . Hemorrhoids   . GERD (gastroesophageal reflux disease)     hx of  . Hematoma     hx of  . Atrial flutter     afib and atrial flutter (permanent)  . Tachycardia-bradycardia 1997    dual-chamber/for tachybradycardia syndrome  . Sleep apnea     no cpap    Past Surgical History  Procedure Laterality Date  . Laparotomy  12/04/2003    resection of rectosigmoid carcinoma/  . Radioactive seed implant  05/2005    transperianeal placement I-125 for prostate cancer/# of  seeds 55  . Cardiac catheterization  05/01/06    EF 40%/diffuse 3 vessel CAD/tight L antereior descending artery stenosis/diffuse disease proximal L anterior descending  . Pacemaker removal  1997  . Fracture surgery      left ankle  . Insert / replace / remove pacemaker  2006  . Colon surgery  2005  . Coronary artery bypass graft  05/06/06    x4 with L internal mammary artery to the L anterior descending coronary artery  . Laser surgery  2012    prostate  . Orchiectomy  august 2013  . Cystoscopy w/ ureteral stent placement Bilateral 08/04/2012    Procedure: CYSTOSCOPY WITH bilateral retrograde;  Surgeon: Dutch Gray, MD;  Location: [facility];  Service: Urology;  Laterality: Bilateral;  attempted stent placement   . Peripherally inserted central catheter insertion  10-30-12    right upper arm-Antibiotic IV "urinary pseudomonas"  . Cystoscopy w/ ureteral stent placement Bilateral 11/03/2012    Procedure: CYSTOSCOPY WITH STENT REPLACEMENT;  Surgeon: Dutch Gray, MD;  Location: [facility];  Service: Urology;  Laterality: Bilateral;    Maylon Peppers RD, [place], [place] Pager [phone] After Hours Pager

## 2013-11-16 NOTE — Progress Notes (Signed)
SLP Cancellation Note  Patient Details Name: ROBLEY MATASSA MRN: [id number] DOB: 09/12/1928   Cancelled treatment:       Reason Eval/Treat Not Completed: Medical issues which prohibited therapy (Re-intubated. Please re-order when appropriate. )  Gabriel Rainwater Alder, CCC-SLP [phone]  [place] Leah Meryl 11/16/2013, 11:46 AM

## 2013-11-16 NOTE — Progress Notes (Signed)
Stroke Team Progress Note  HISTORY Jeffery Gibson is a 80 y.o. male with a history of multiple stroke risk factors including afib not on anticoagulation due to history of GI bleeding(none recently). He was in his normal state of health until 8:40 pm at which time his daughter went in to the kitchen to fix him a sandwhich. On returning, he was weak on the left side and unintelligible.  He was functional at baseline, attending to his own adls.   Patient was administered IV tPA and then underwent IA tPA and clot retrieval.   He was admitted to the neuro ICU for further evaluation and treatment.  SUBJECTIVE Extubated this morning but unable to tolerate so re-intubation planned per CCM. Resting comfortably. IV tPA given at 2139 on 6/28, IA and intervention at 2250 on 6/28. Per RN stable overnight.   OBJECTIVE Most recent Vital Signs: Filed Vitals:   11/16/13 0600 11/16/13 0630 11/16/13 0700 11/16/13 0800  BP: 128/55  97/68 117/52  Pulse: 64 71  34  Temp:    97.2 F (36.2 C)  TempSrc:    Axillary  Resp: 18 23 18 22   Height:      Weight:      SpO2: 100% 100%  100%   CBG (last 3)   Recent Labs  11/15/13 2131 11/16/13 0332 11/16/13 0759  GLUCAP 170* 200* 230*    IV Fluid Intake:   . sodium chloride    . sodium chloride 75 mL/hr at 11/16/13 0300  . niCARDipine 15 mg/hr (11/16/13 0800)  . propofol 15 mcg/kg/min (11/16/13 0800)    MEDICATIONS  . amiodarone  100 mg Oral Daily  . antiseptic oral rinse  15 mL Mouth Rinse q12n4p  . atorvastatin  10 mg Oral q1800  . chlorhexidine  15 mL Mouth Rinse BID  . enzalutamide  160 mg Oral Daily  . finasteride  5 mg Oral QPM  . insulin aspart  2-6 Units Subcutaneous 6 times per day  . pantoprazole (PROTONIX) IV  40 mg Intravenous QHS  . ursodiol  300 mg Oral BID   PRN:  acetaminophen, acetaminophen, fentaNYL, labetalol, ondansetron (ZOFRAN) IV  Diet:  NPO  Activity:  Bedrest DVT Prophylaxis:  SCDs  CLINICALLY SIGNIFICANT  STUDIES Basic Metabolic Panel:  Recent Labs Lab 11/15/13 2117 11/15/13 2125 11/16/13 0350  NA 137 134* 135*  K 4.6 4.4 4.6  CL 98 102 99  CO2 21  --  19  GLUCOSE 152* 153* 242*  BUN 42* 38* 38*  CREATININE 2.03* 2.30* 1.77*  CALCIUM 9.6  --  8.3*   Liver Function Tests:  Recent Labs Lab 11/15/13 2117  AST 10  ALT <5  ALKPHOS 94  BILITOT 0.3  PROT 6.9  ALBUMIN 3.2*   CBC:  Recent Labs Lab 11/15/13 2117 11/15/13 2125 11/16/13 0350  WBC 7.3  --  8.9  NEUTROABS 4.8  --  8.0*  HGB 11.1* 11.2* 9.6*  HCT 32.4* 33.0* 28.2*  MCV 99.7  --  98.3  PLT 175  --  145*   Coagulation:  Recent Labs Lab 11/15/13 2117  LABPROT 12.9  INR 0.97   Cardiac Enzymes: No results found for this basename: CKTOTAL, CKMB, CKMBINDEX, TROPONINI,  in the last 168 hours Urinalysis: No results found for this basename: COLORURINE, APPERANCEUR, LABSPEC, PHURINE, GLUCOSEU, HGBUR, BILIRUBINUR, KETONESUR, PROTEINUR, UROBILINOGEN, NITRITE, LEUKOCYTESUR,  in the last 168 hours Lipid Panel    Component Value Date/Time   CHOL 125 11/16/2013 0350   TRIG  232* 11/16/2013 0351   HDL 38* 11/16/2013 0350   CHOLHDL 3.3 11/16/2013 0350   VLDL 45* 11/16/2013 0350   LDLCALC 42 11/16/2013 0350   HgbA1C  Lab Results  Component Value Date   HGBA1C 6.8* 07/24/2011    Urine Drug Screen:   No results found for this basename: labopia, cocainscrnur, labbenz, amphetmu, thcu, labbarb    Alcohol Level: No results found for this basename: ETH,  in the last 168 hours  Ct Angio Head W/cm &/or Wo Cm  11/15/2013   CLINICAL DATA:  Code stroke, left facial droop and weakness. History of prostate cancer.  EXAM: CT ANGIOGRAPHY HEAD AND NECK  TECHNIQUE: Multidetector CT imaging of the head and neck was performed using the standard protocol during bolus administration of intravenous contrast. Multiplanar CT image reconstructions and MIPs were obtained to evaluate the vascular anatomy. Carotid stenosis measurements (when  applicable) are obtained utilizing NASCET criteria, using the distal internal carotid diameter as the denominator.  CONTRAST:  60mL OMNIPAQUE IOHEXOL 350 MG/ML SOLN  COMPARISON:  CT of the head November 15, 2013 at 2123 hr.  FINDINGS: CTA HEAD FINDINGS  Anterior circulation: Normal appearance of the cervical internal carotid arteries, petrous, cavernous and supra clinoid internal carotid arteries ; moderate calcific atherosclerosis. At the level of the right middle cerebral artery bifurcation there is complete occlusion of a right M2 branch (likely inferior branch). Due to decreased profusion, there is delayed enhancement of the right cortical veins. Widely patent anterior communicating artery. Normal appearance of the anterior cerebral arteries.  Posterior circulation: Codominant vertebral arteries, with normal appearance of the vertebral arteries, vertebrobasilar junction and basilar artery, as well as main branch vessels. Small bilateral posterior communicating arteries are present. Normal appearance of the posterior cerebral arteries.  No hemodynamically significant stenosis, dissection, luminal irregularity, contrast extravasation or aneurysm within the anterior nor posterior circulation.  Postcontrast imaging of the brain demonstrates no enhancing intracranial lesions. No intraparenchymal hemorrhage.  Review of the MIP images confirms the above findings.  CTA NECK FINDINGS  Normal appearance of the thoracic arch, normal branch pattern. Moderate calcific atherosclerosis. The origins of the innominate, left Common carotid artery and subclavian artery are widely patent. Streak artifact from dental amalgam limits evaluation of the cervical vessels at skull base.  Bilateral Common carotid arteries are widely patent, coursing in a straight line fashion. Medial to that course of the left Common carotid artery, normal variant and could be transient. 1-2 mm eccentric calcific atherosclerosis of the carotid bulbs extending  into the internal carotid arteries without hemodynamically significant stenosis by NASCET criteria. Normal appearance of the included internal carotid arteries.  Left vertebral artery is dominant. Normal appearance of the vertebral arteries, which appear widely patent.  No hemodynamically significant stenosis by NASCET criteria. No dissection, no pseudoaneurysm. No abnormal luminal irregularity. No contrast extravasation.  Thyromegaly with partially calcified multi lobulated heterogeneous left thyroid mass measuring at least 6.6 x 4.8 x 3.8 cm with substernal extent, at displacing the trachea to the right, splaying the trachea and aorta. Multiple sclerotic lesions in the ribs and vertebral bodies concerning for metastatic disease. Status post median sternotomy.  Review of the MIP images confirms the above findings.  IMPRESSION: CTA head: Acute right M2 occlusive thromboembolism, as seen on prior CT.  CTA neck: No hemodynamically significant stenosis.  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  6.6 x 4.8 x 3.8 cm suspicious left thyroid mass displaces the trachea to the right, recommend follow-up thyroid  sonogram on a nonemergent basis.  Preliminary findings discussed by Dr. Eustace Quail, Radiology and reconfirmed by Dr.MCNEILL [facility] on6/28/2015atat approximately 2230 hr.   Electronically Signed   By: Elon Alas   On: 11/15/2013 23:23   Ct Head Wo Contrast  11/16/2013   CLINICAL DATA:  Status post intervention.  EXAM: CT HEAD WITHOUT CONTRAST  TECHNIQUE: Contiguous axial images were obtained from the base of the skull through the vertex without intravenous contrast.  COMPARISON:  Head CT from yesterday  FINDINGS: There is new nasopharyngeal fluid related to intubation. No acute osseous findings have developed.  There is new serpentine high density in the lower sylvian fissure and neighboring sulci. This appears sulcal rather than cortical. No discrete hematoma. No visible infarct.  No hydrocephalus or shift. Generalized brain atrophy and chronic vessel disease is again noted.  These results were called by telephone at the time of interpretation on 11/16/2013 at 2:55 AM to Dr. Leonel Ramsay, who verbally acknowledged these results.  IMPRESSION: 1. Subarachnoid high attenuation along the right sylvian fissure which is often contrast staining, but cannot exclude subarachnoid hemorrhage. Staining should dissipated on short followup head CT. 2. No visible infarct.   Electronically Signed   By: Jorje Guild M.D.   On: 11/16/2013 02:59   Ct Head (brain) Wo Contrast  11/15/2013   CLINICAL DATA:  Code stroke.  Left-sided facial droop and weakness.  EXAM: CT HEAD WITHOUT CONTRAST  TECHNIQUE: Contiguous axial images were obtained from the base of the skull through the vertex without intravenous contrast.  COMPARISON:  None.  FINDINGS: Skull and Sinuses:Negative for fracture or destructive process. Smoothly contoured opacity in the left sphenoid sinus compatible with mucous retention cyst or less likely polyp.  Orbits: No acute abnormality.  Brain: There is MCA high-density at the level of the M1 M2 junction which given the history is consistent with thrombus. No ischemic changes are seen in the right MCA territory currently. No acute hemorrhage. There is chronic small vessel disease with ischemic gliosis around the lateral ventricles. Generalized brain atrophy. No evidence of mass lesion, hydrocephalus, or shift.  Critical Value/emergent results were called by telephone at the time of interpretation on 11/15/2013 at 10:07 PM to Dr. Leonel Ramsay, who verbally acknowledged these results.  IMPRESSION: 1. Right MCA thrombus at the M1/M2 junction. No ischemic changes currently visible. 2. No acute intracranial hemorrhage. 3. Brain atrophy and chronic small vessel disease.   Electronically Signed   By: Jorje Guild M.D.   On: 11/15/2013 22:10   Ct Angio Neck W/cm &/or Wo/cm  11/15/2013   CLINICAL DATA:   Code stroke, left facial droop and weakness. History of prostate cancer.  EXAM: CT ANGIOGRAPHY HEAD AND NECK  TECHNIQUE: Multidetector CT imaging of the head and neck was performed using the standard protocol during bolus administration of intravenous contrast. Multiplanar CT image reconstructions and MIPs were obtained to evaluate the vascular anatomy. Carotid stenosis measurements (when applicable) are obtained utilizing NASCET criteria, using the distal internal carotid diameter as the denominator.  CONTRAST:  68mL OMNIPAQUE IOHEXOL 350 MG/ML SOLN  COMPARISON:  CT of the head November 15, 2013 at 2123 hr.  FINDINGS: CTA HEAD FINDINGS  Anterior circulation: Normal appearance of the cervical internal carotid arteries, petrous, cavernous and supra clinoid internal carotid arteries ; moderate calcific atherosclerosis. At the level of the right middle cerebral artery bifurcation there is complete occlusion of a right M2 branch (likely inferior branch). Due to decreased profusion, there is delayed enhancement of the  right cortical veins. Widely patent anterior communicating artery. Normal appearance of the anterior cerebral arteries.  Posterior circulation: Codominant vertebral arteries, with normal appearance of the vertebral arteries, vertebrobasilar junction and basilar artery, as well as main branch vessels. Small bilateral posterior communicating arteries are present. Normal appearance of the posterior cerebral arteries.  No hemodynamically significant stenosis, dissection, luminal irregularity, contrast extravasation or aneurysm within the anterior nor posterior circulation.  Postcontrast imaging of the brain demonstrates no enhancing intracranial lesions. No intraparenchymal hemorrhage.  Review of the MIP images confirms the above findings.  CTA NECK FINDINGS  Normal appearance of the thoracic arch, normal branch pattern. Moderate calcific atherosclerosis. The origins of the innominate, left Common carotid artery  and subclavian artery are widely patent. Streak artifact from dental amalgam limits evaluation of the cervical vessels at skull base.  Bilateral Common carotid arteries are widely patent, coursing in a straight line fashion. Medial to that course of the left Common carotid artery, normal variant and could be transient. 1-2 mm eccentric calcific atherosclerosis of the carotid bulbs extending into the internal carotid arteries without hemodynamically significant stenosis by NASCET criteria. Normal appearance of the included internal carotid arteries.  Left vertebral artery is dominant. Normal appearance of the vertebral arteries, which appear widely patent.  No hemodynamically significant stenosis by NASCET criteria. No dissection, no pseudoaneurysm. No abnormal luminal irregularity. No contrast extravasation.  Thyromegaly with partially calcified multi lobulated heterogeneous left thyroid mass measuring at least 6.6 x 4.8 x 3.8 cm with substernal extent, at displacing the trachea to the right, splaying the trachea and aorta. Multiple sclerotic lesions in the ribs and vertebral bodies concerning for metastatic disease. Status post median sternotomy.  Review of the MIP images confirms the above findings.  IMPRESSION: CTA head: Acute right M2 occlusive thromboembolism, as seen on prior CT.  CTA neck: No hemodynamically significant stenosis.  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  6.6 x 4.8 x 3.8 cm suspicious left thyroid mass displaces the trachea to the right, recommend follow-up thyroid sonogram on a nonemergent basis.  Preliminary findings discussed by Dr. Eustace Quail, Radiology and reconfirmed by Dr[facility]NEILL [facility] on6/28/2015atat approximately 2230 hr.   Electronically Signed   By: Elon Alas   On: 11/15/2013 23:23   Dg Chest Port 1 View  11/16/2013   CLINICAL DATA:  Recent stroke  EXAM: PORTABLE CHEST - 1 VIEW  COMPARISON:  08/19/2012  FINDINGS: An endotracheal tube is noted  6 cm above the carina. Nasogastric catheter is noted coiled within the stomach. A pacing device is noted on the right and stable. Postoperative changes are seen as well. Cardiac shadow is stable. Left basilar atelectatic changes are noted. No focal confluent infiltrate is seen. No bony abnormality is noted.  IMPRESSION: Left basilar atelectasis.  Tubes and lines as described.   Electronically Signed   By: Inez Catalina M.D.   On: 11/16/2013 08:01    CT of the brain   6/29 post intervention 1. Subarachnoid high attenuation along the right sylvian fissure  which is often contrast staining, but cannot exclude subarachnoid  hemorrhage. Staining should dissipated on short followup head CT.  2. No visible infarct.  6/28 pre intervention CTA head: Acute right M2 occlusive thromboembolism, as seen on prior  CT.  CTA neck: No hemodynamically significant stenosis.  Osseous metastasis likely related to patient's known prostate  cancer.  6.6 x 4.8 x 3.8 cm suspicious left thyroid mass displaces the  trachea to the right, recommend follow-up  thyroid sonogram on a  nonemergent basis.   MRI of the brain    MRA of the brain    2D Echocardiogram    Carotid Doppler    CXR     Therapy Recommendations pending  Physical Exam   General:intubated on propofol gtt CV: irregular  Mental Status:  Eyes closed, not following commands  Cranial Nerves:  II: decreased blink to threat on the left Pupils are equal, round, and reactive to light.  V: Facial sensation is deccreased on left  VII: possible left facial droop, difficult to fully tell due to intubation Motor:  Tone is normal. Bulk is normal. No spontaneous movement, appears to move right side against noxious stimuli. No withdraw to noxious stimuli on left side.  Deep Tendon Reflexes:  2+ and symmetric in the biceps and patellae.     ASSESSMENT Jeffery Gibson is a 80 y.o. male presenting with left sided weakness and unintelligible  speech. Status post IV t-PA, IA t-PA and clot retrieval. Initial head CT shows acute right M2 clot but no signs of ischemic stroke. Repeat imaging pending (unable to have MRI due to PPM). Infarct felt to be embolic possibly secondary to known A fib.  On ASA 81mg  prior to admission. Now held secondary to administration of t-PA. Patient with resultant left sided weakness. Stroke work up underway.   HTN  LDL  CAD, s/p MI  History of A fib  PPM  DM  Thyroid mass  Metastatic prostate CA  CKD   Hospital day # 1  TREATMENT/PLAN  Repeat head CT at 2200 on 6/29, if stable will start ASA  Re-intubated per CCM  Lipitor 10mg   Rehab evaluation  2D echo   Carotid doppler  With history of stroke and A fib will need to consider oral anticoagulation. He has a complicated pmhx including metastatic prostate CA,  history of GI bleed which may prohibit the use of anticoagulation. Will defer decision pending further workup.     This patient is critically ill and at significant risk of neurological worsening, death and care requires constant monitoring of vital signs, hemodynamics,respiratory and cardiac monitoring,review of multiple databases, neurological assessment, discussion with family, other specialists and medical decision making of high complexity. I spent 45 inutes of neurocritical care time in the care of this patient.    Jim Like, DO [facility] Pager: [phone]   To contact Stroke Continuity provider, please refer to [email or web address] After hours, contact General Neurology

## 2013-11-16 NOTE — Telephone Encounter (Signed)
Thanks for the FYI

## 2013-11-16 NOTE — Procedures (Signed)
S/P Rt common carotid arteriogram followed by complete revascularization of occuded Rt MCA using 13 mg of IA TPA and 1pass with TRevoprovue  And one pass with 19mm x 20 mm SolitaireFR device.

## 2013-11-16 NOTE — Progress Notes (Signed)
Rt 9Fr RFA sheath removed using Exoseal device and manual pressure for 15 minutes.  Groin intact and no hematoma noted. Distal pulses intact. Pressure dressing and sandbag applied.   4Fr LFA sheath removed using manual pressure. Pressure dressing applied and distal pulsed intact.  Reviewed with Maree Krabbe RN.

## 2013-11-16 NOTE — Telephone Encounter (Signed)
New problem    Pt had stroke and is at [facility] just an [place].

## 2013-11-16 NOTE — Progress Notes (Signed)
VASCULAR LAB PRELIMINARY  PRELIMINARY  PRELIMINARY  PRELIMINARY  Patient had normal CTA of the neck.  Please advise if Carotid Dopplers still neeeded. Paged Dr. Janann Colonel at 14:15 for directives, but no return call.      KANADY, CANDACE, RVT 11/16/2013, 2:22 PM

## 2013-11-16 NOTE — Progress Notes (Signed)
PULMONARY / CRITICAL CARE MEDICINE  Name: Jeffery Gibson MRN: [id number] DOB: 01-21-1929    ADMISSION DATE:  11/15/2013 CONSULTATION DATE:  11/15/2013  REFERRING MD :  Roland Rack, MD PRIMARY SERVICE: Neurology  CHIEF COMPLAINT:  CVA, acute respiratory failure  BRIEF PATIENT DESCRIPTION:  80 yo with CAD post CABG, AF ( not on anticoagulation due to GI bleed ), pacemaker, HTN, DM, CKD. Presents with left sided weakness and found to have a right MCA stroke. Intubated for airway protection. Underwent systemic and catheter directed TPA.   SIGNIFICANT EVENTS / STUDIES:  6/28  Head CT >>> R MCA thrombus at the M1/M2 junction 6/28  Systemic and catheter directed TPA 6/29  Failed intubation due to inability to protect airway   LINES / TUBES: OETT 6/28 >>> 6/29; 6/29 >>> OGT 6/29 >>> Foley 6/28 >>>  CULTURES:  ANTIBIOTICS:  INTERVAL HISTORY: Tolerated SBT fine and was able to follow commands, but upon extubation is unable to protect airway, reintubated.  VITAL SIGNS: Temp:  [97.2 F (36.2 C)-99.1 F (37.3 C)] 97.2 F (36.2 C) (06/29 0800) Pulse Rate:  [34-92] 34 (06/29 0800) Resp:  [12-23] 22 (06/29 0800) BP: (97-199)/(49-83) 117/52 mmHg (06/29 0800) SpO2:  [92 %-100 %] 100 % (06/29 0800) Arterial Line BP: (139-205)/(53-79) 139/54 mmHg (06/29 0800) FiO2 (%):  [40 %-50 %] 40 % (06/29 0808) Weight:  [98.3 kg (216 lb 11.4 oz)] 98.3 kg (216 lb 11.4 oz) (06/29 0223)  HEMODYNAMICS:   VENTILATOR SETTINGS: Vent Mode:  [-] PSV;CPAP FiO2 (%):  [40 %-50 %] 40 % Set Rate:  [12 bmp] 12 bmp Vt Set:  [650 mL] 650 mL PEEP:  [5 cmH20] 5 cmH20 Pressure Support:  [5 cmH20] 5 cmH20 Plateau Pressure:  [20 cmH20] 20 cmH20  INTAKE / OUTPUT: Intake/Output     06/28 0701 - 06/29 0700 06/29 0701 - 06/30 0700   I.V. (mL/kg) 2265.5 (23) 242 (2.5)   Total Intake(mL/kg) 2265.5 (23) 242 (2.5)   Urine (mL/kg/hr) 1000 125 (0.6)   Total Output 1000 125   Net +1265.5 +117           PHYSICAL EXAMINATION: General:  Appears acutely ill, mechanically ventilated, synchronous Neuro:  Encephalopathic, nonfocal, cough / gag diminished HEENT:  PERRL, OETT Cardiovascular:  RRR, no m/r/g Lungs:  Bilateral diminished air entry, no w/r/r Abdomen:  Soft, nontender, bowel sounds diminished Musculoskeletal:  Moves all extremities, no edema Skin:  Intact  LABS:  CBC  Recent Labs Lab 11/15/13 2117 11/15/13 2125 11/16/13 0350  WBC 7.3  --  8.9  HGB 11.1* 11.2* 9.6*  HCT 32.4* 33.0* 28.2*  PLT 175  --  145*   Coag's  Recent Labs Lab 11/15/13 2117  APTT 24  INR 0.97   BMET  Recent Labs Lab 11/15/13 2117 11/15/13 2125 11/16/13 0350  NA 137 134* 135*  K 4.6 4.4 4.6  CL 98 102 99  CO2 21  --  19  BUN 42* 38* 38*  CREATININE 2.03* 2.30* 1.77*  GLUCOSE 152* 153* 242*   Electrolytes  Recent Labs Lab 11/15/13 2117 11/16/13 0350  CALCIUM 9.6 8.3*   Sepsis Markers No results found for this basename: LATICACIDVEN, PROCALCITON, O2SATVEN,  in the last 168 hours  ABG No results found for this basename: PHART, PCO2ART, PO2ART,  in the last 168 hours  Liver Enzymes  Recent Labs Lab 11/15/13 2117  AST 10  ALT <5  ALKPHOS 94  BILITOT 0.3  ALBUMIN 3.2*   Cardiac Enzymes  No results found for this basename: TROPONINI, PROBNP,  in the last 168 hours Glucose  Recent Labs Lab 11/15/13 2131 11/16/13 0332 11/16/13 0759  GLUCAP 170* 200* 230*   IMAGING:   Ct Angio Head W/cm &/or Wo Cm  11/15/2013   CLINICAL DATA:  Code stroke, left facial droop and weakness. History of prostate cancer.  EXAM: CT ANGIOGRAPHY HEAD AND NECK  TECHNIQUE: Multidetector CT imaging of the head and neck was performed using the standard protocol during bolus administration of intravenous contrast. Multiplanar CT image reconstructions and MIPs were obtained to evaluate the vascular anatomy. Carotid stenosis measurements (when applicable) are obtained utilizing NASCET criteria,  using the distal internal carotid diameter as the denominator.  CONTRAST:  4mL OMNIPAQUE IOHEXOL 350 MG/ML SOLN  COMPARISON:  CT of the head November 15, 2013 at 2123 hr.  FINDINGS: CTA HEAD FINDINGS  Anterior circulation: Normal appearance of the cervical internal carotid arteries, petrous, cavernous and supra clinoid internal carotid arteries ; moderate calcific atherosclerosis. At the level of the right middle cerebral artery bifurcation there is complete occlusion of a right M2 branch (likely inferior branch). Due to decreased profusion, there is delayed enhancement of the right cortical veins. Widely patent anterior communicating artery. Normal appearance of the anterior cerebral arteries.  Posterior circulation: Codominant vertebral arteries, with normal appearance of the vertebral arteries, vertebrobasilar junction and basilar artery, as well as main branch vessels. Small bilateral posterior communicating arteries are present. Normal appearance of the posterior cerebral arteries.  No hemodynamically significant stenosis, dissection, luminal irregularity, contrast extravasation or aneurysm within the anterior nor posterior circulation.  Postcontrast imaging of the brain demonstrates no enhancing intracranial lesions. No intraparenchymal hemorrhage.  Review of the MIP images confirms the above findings.  CTA NECK FINDINGS  Normal appearance of the thoracic arch, normal branch pattern. Moderate calcific atherosclerosis. The origins of the innominate, left Common carotid artery and subclavian artery are widely patent. Streak artifact from dental amalgam limits evaluation of the cervical vessels at skull base.  Bilateral Common carotid arteries are widely patent, coursing in a straight line fashion. Medial to that course of the left Common carotid artery, normal variant and could be transient. 1-2 mm eccentric calcific atherosclerosis of the carotid bulbs extending into the internal carotid arteries without  hemodynamically significant stenosis by NASCET criteria. Normal appearance of the included internal carotid arteries.  Left vertebral artery is dominant. Normal appearance of the vertebral arteries, which appear widely patent.  No hemodynamically significant stenosis by NASCET criteria. No dissection, no pseudoaneurysm. No abnormal luminal irregularity. No contrast extravasation.  Thyromegaly with partially calcified multi lobulated heterogeneous left thyroid mass measuring at least 6.6 x 4.8 x 3.8 cm with substernal extent, at displacing the trachea to the right, splaying the trachea and aorta. Multiple sclerotic lesions in the ribs and vertebral bodies concerning for metastatic disease. Status post median sternotomy.  Review of the MIP images confirms the above findings.  IMPRESSION: CTA head: Acute right M2 occlusive thromboembolism, as seen on prior CT.  CTA neck: No hemodynamically significant stenosis.  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  6.6 x 4.8 x 3.8 cm suspicious left thyroid mass displaces the trachea to the right, recommend follow-up thyroid sonogram on a nonemergent basis.  Preliminary findings discussed by Dr. Eustace Quail, Radiology and reconfirmed by Dr.MCNEILL [facility] on6/28/2015atat approximately 2230 hr.   Electronically Signed   By: Elon Alas   On: 11/15/2013 23:23   Ct Head Wo Contrast  11/16/2013  CLINICAL DATA:  Status post intervention.  EXAM: CT HEAD WITHOUT CONTRAST  TECHNIQUE: Contiguous axial images were obtained from the base of the skull through the vertex without intravenous contrast.  COMPARISON:  Head CT from yesterday  FINDINGS: There is new nasopharyngeal fluid related to intubation. No acute osseous findings have developed.  There is new serpentine high density in the lower sylvian fissure and neighboring sulci. This appears sulcal rather than cortical. No discrete hematoma. No visible infarct. No hydrocephalus or shift. Generalized brain  atrophy and chronic vessel disease is again noted.  These results were called by telephone at the time of interpretation on 11/16/2013 at 2:55 AM to Dr. Leonel Ramsay, who verbally acknowledged these results.  IMPRESSION: 1. Subarachnoid high attenuation along the right sylvian fissure which is often contrast staining, but cannot exclude subarachnoid hemorrhage. Staining should dissipated on short followup head CT. 2. No visible infarct.   Electronically Signed   By: Jorje Guild M.D.   On: 11/16/2013 02:59   Ct Head (brain) Wo Contrast  11/15/2013   CLINICAL DATA:  Code stroke.  Left-sided facial droop and weakness.  EXAM: CT HEAD WITHOUT CONTRAST  TECHNIQUE: Contiguous axial images were obtained from the base of the skull through the vertex without intravenous contrast.  COMPARISON:  None.  FINDINGS: Skull and Sinuses:Negative for fracture or destructive process. Smoothly contoured opacity in the left sphenoid sinus compatible with mucous retention cyst or less likely polyp.  Orbits: No acute abnormality.  Brain: There is MCA high-density at the level of the M1 M2 junction which given the history is consistent with thrombus. No ischemic changes are seen in the right MCA territory currently. No acute hemorrhage. There is chronic small vessel disease with ischemic gliosis around the lateral ventricles. Generalized brain atrophy. No evidence of mass lesion, hydrocephalus, or shift.  Critical Value/emergent results were called by telephone at the time of interpretation on 11/15/2013 at 10:07 PM to Dr. Leonel Ramsay, who verbally acknowledged these results.  IMPRESSION: 1. Right MCA thrombus at the M1/M2 junction. No ischemic changes currently visible. 2. No acute intracranial hemorrhage. 3. Brain atrophy and chronic small vessel disease.   Electronically Signed   By: Jorje Guild M.D.   On: 11/15/2013 22:10   Ct Angio Neck W/cm &/or Wo/cm  11/15/2013   CLINICAL DATA:  Code stroke, left facial droop and weakness.  History of prostate cancer.  EXAM: CT ANGIOGRAPHY HEAD AND NECK  TECHNIQUE: Multidetector CT imaging of the head and neck was performed using the standard protocol during bolus administration of intravenous contrast. Multiplanar CT image reconstructions and MIPs were obtained to evaluate the vascular anatomy. Carotid stenosis measurements (when applicable) are obtained utilizing NASCET criteria, using the distal internal carotid diameter as the denominator.  CONTRAST:  54mL OMNIPAQUE IOHEXOL 350 MG/ML SOLN  COMPARISON:  CT of the head November 15, 2013 at 2123 hr.  FINDINGS: CTA HEAD FINDINGS  Anterior circulation: Normal appearance of the cervical internal carotid arteries, petrous, cavernous and supra clinoid internal carotid arteries ; moderate calcific atherosclerosis. At the level of the right middle cerebral artery bifurcation there is complete occlusion of a right M2 branch (likely inferior branch). Due to decreased profusion, there is delayed enhancement of the right cortical veins. Widely patent anterior communicating artery. Normal appearance of the anterior cerebral arteries.  Posterior circulation: Codominant vertebral arteries, with normal appearance of the vertebral arteries, vertebrobasilar junction and basilar artery, as well as main branch vessels. Small bilateral posterior communicating arteries are present. Normal  appearance of the posterior cerebral arteries.  No hemodynamically significant stenosis, dissection, luminal irregularity, contrast extravasation or aneurysm within the anterior nor posterior circulation.  Postcontrast imaging of the brain demonstrates no enhancing intracranial lesions. No intraparenchymal hemorrhage.  Review of the MIP images confirms the above findings.  CTA NECK FINDINGS  Normal appearance of the thoracic arch, normal branch pattern. Moderate calcific atherosclerosis. The origins of the innominate, left Common carotid artery and subclavian artery are widely patent. Streak  artifact from dental amalgam limits evaluation of the cervical vessels at skull base.  Bilateral Common carotid arteries are widely patent, coursing in a straight line fashion. Medial to that course of the left Common carotid artery, normal variant and could be transient. 1-2 mm eccentric calcific atherosclerosis of the carotid bulbs extending into the internal carotid arteries without hemodynamically significant stenosis by NASCET criteria. Normal appearance of the included internal carotid arteries.  Left vertebral artery is dominant. Normal appearance of the vertebral arteries, which appear widely patent.  No hemodynamically significant stenosis by NASCET criteria. No dissection, no pseudoaneurysm. No abnormal luminal irregularity. No contrast extravasation.  Thyromegaly with partially calcified multi lobulated heterogeneous left thyroid mass measuring at least 6.6 x 4.8 x 3.8 cm with substernal extent, at displacing the trachea to the right, splaying the trachea and aorta. Multiple sclerotic lesions in the ribs and vertebral bodies concerning for metastatic disease. Status post median sternotomy.  Review of the MIP images confirms the above findings.  IMPRESSION: CTA head: Acute right M2 occlusive thromboembolism, as seen on prior CT.  CTA neck: No hemodynamically significant stenosis.  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  6.6 x 4.8 x 3.8 cm suspicious left thyroid mass displaces the trachea to the right, recommend follow-up thyroid sonogram on a nonemergent basis.  Preliminary findings discussed by Dr. Eustace Quail, Radiology and reconfirmed by Dr[facility]MCNEILL [facility] on6/28/2015atat approximately 2230 hr.   Electronically Signed   By: Elon Alas   On: 11/15/2013 23:23   Dg Chest Port 1 View  11/16/2013   CLINICAL DATA:  Recent stroke  EXAM: PORTABLE CHEST - 1 VIEW  COMPARISON:  08/19/2012  FINDINGS: An endotracheal tube is noted 6 cm above the carina. Nasogastric catheter is  noted coiled within the stomach. A pacing device is noted on the right and stable. Postoperative changes are seen as well. Cardiac shadow is stable. Left basilar atelectatic changes are noted. No focal confluent infiltrate is seen. No bony abnormality is noted.  IMPRESSION: Left basilar atelectasis.  Tubes and lines as described.   Electronically Signed   By: Inez Catalina M.D.   On: 11/16/2013 08:01   PULMONARY A:   Acute respiratory failure due to inability to protect airway P:   Goal SpO2>92, pH>7.30 Full mechanical support Daily SBT, but no extubation >>> needs early tracheostomy Ventilator bundle Trend ABG / CXR  CARDIOVASCULAR A:  AF HTN P:  Goal SBP<130 Cardene gtt Labetalol PRN Amiodarone, Lipitor TTE  RENAL A:   AKI Hyponatremia, mild Prostate CA P:   Trend BMP NS@75  Xtandi Proscar  GASTROINTESTINAL A:   Nutrition GI Px P:   NPO as intubated Start TF Protonix  HEMATOLOGIC A:  Anemia, mild VTE Px P:  Trend CBC SCDs  INFECTIOUS A:   No evidence of acute infection P:   Monitor off abx  ENDOCRINE  A:   DM Hyperglycemia  P:   SSI  NEUROLOGIC A:   Acute R MCA CVA s/p systemic / catheter tPA Acute  encephalopathy P:   Propofol gtt Goal RASS 0 to -1 Daily WUA  I have personally obtained history, examined patient, evaluated and interpreted laboratory and imaging results, reviewed medical records, formulated assessment / plan and placed orders.  CRITICAL CARE:  The patient is critically ill with multiple organ systems failure and requires high complexity decision making for assessment and support, frequent evaluation and titration of therapies, application of advanced monitoring technologies and extensive interpretation of multiple databases. Critical Care Time devoted to patient care services described in this note is 35 minutes.   Doree Fudge, MD Pulmonary and [place] Pager: [phone]  11/16/2013, 9:23 AM

## 2013-11-17 ENCOUNTER — Inpatient Hospital Stay (HOSPITAL_COMMUNITY): Payer: Medicare Other

## 2013-11-17 ENCOUNTER — Encounter (HOSPITAL_COMMUNITY): Payer: Self-pay | Admitting: Interventional Radiology

## 2013-11-17 DIAGNOSIS — J9600 Acute respiratory failure, unspecified whether with hypoxia or hypercapnia: Secondary | ICD-10-CM

## 2013-11-17 DIAGNOSIS — I5022 Chronic systolic (congestive) heart failure: Secondary | ICD-10-CM

## 2013-11-17 DIAGNOSIS — I509 Heart failure, unspecified: Secondary | ICD-10-CM

## 2013-11-17 LAB — GLUCOSE, CAPILLARY
GLUCOSE-CAPILLARY: 193 mg/dL — AB (ref 70–99)
Glucose-Capillary: 201 mg/dL — ABNORMAL HIGH (ref 70–99)
Glucose-Capillary: 207 mg/dL — ABNORMAL HIGH (ref 70–99)
Glucose-Capillary: 129 mg/dL — ABNORMAL HIGH (ref 70–99)
Glucose-Capillary: 142 mg/dL — ABNORMAL HIGH (ref 70–99)

## 2013-11-17 LAB — CBC
HCT: 26.1 % — ABNORMAL LOW (ref 39.0–52.0)
HEMOGLOBIN: 8.7 g/dL — AB (ref 13.0–17.0)
MCH: 34 pg (ref 26.0–34.0)
MCHC: 33.3 g/dL (ref 30.0–36.0)
MCV: 102 fL — ABNORMAL HIGH (ref 78.0–100.0)
Platelets: 151 10*3/uL (ref 150–400)
RBC: 2.56 MIL/uL — AB (ref 4.22–5.81)
RDW: 15.2 % (ref 11.5–15.5)
WBC: 12.2 10*3/uL — ABNORMAL HIGH (ref 4.0–10.5)

## 2013-11-17 LAB — BASIC METABOLIC PANEL
BUN: 43 mg/dL — ABNORMAL HIGH (ref 6–23)
CO2: 18 meq/L — AB (ref 19–32)
Calcium: 8.2 mg/dL — ABNORMAL LOW (ref 8.4–10.5)
Chloride: 107 mEq/L (ref 96–112)
Creatinine, Ser: 2.11 mg/dL — ABNORMAL HIGH (ref 0.50–1.35)
GFR calc Af Amer: 32 mL/min — ABNORMAL LOW (ref >90)
GFR calc non Af Amer: 27 mL/min — ABNORMAL LOW (ref >90)
GLUCOSE: 182 mg/dL — AB (ref 70–99)
POTASSIUM: 4.9 meq/L (ref 3.7–5.3)
Sodium: 141 mEq/L (ref 137–147)

## 2013-11-17 MED ORDER — FENTANYL CITRATE 0.05 MG/ML IJ SOLN
INTRAMUSCULAR | Status: AC
  Administered 2013-11-17: 50 ug via INTRAVENOUS
  Filled 2013-11-17: qty 2

## 2013-11-17 MED ORDER — FREE WATER
100.0000 mL | Freq: Three times a day (TID) | Status: DC
  Administered 2013-11-17: 100 mL

## 2013-11-17 MED ORDER — FENTANYL CITRATE 0.05 MG/ML IJ SOLN
50.0000 ug | INTRAMUSCULAR | Status: DC | PRN
  Administered 2013-11-17 (×2): 50 ug via INTRAVENOUS
  Filled 2013-11-17: qty 2

## 2013-11-17 MED ORDER — INSULIN GLARGINE 100 UNIT/ML ~~LOC~~ SOLN
10.0000 [IU] | Freq: Every day | SUBCUTANEOUS | Status: DC
  Administered 2013-11-17 – 2013-11-18 (×2): 10 [IU] via SUBCUTANEOUS
  Filled 2013-11-17 (×3): qty 0.1

## 2013-11-17 MED ORDER — FENTANYL CITRATE 0.05 MG/ML IJ SOLN
12.5000 ug | INTRAMUSCULAR | Status: DC | PRN
  Administered 2013-11-17 – 2013-11-19 (×9): 12.5 ug via INTRAVENOUS
  Filled 2013-11-17 (×9): qty 2

## 2013-11-17 MED ORDER — SODIUM CHLORIDE 0.9 % IV SOLN
3.0000 g | Freq: Three times a day (TID) | INTRAVENOUS | Status: DC
  Administered 2013-11-17 – 2013-11-19 (×7): 3 g via INTRAVENOUS
  Filled 2013-11-17 (×9): qty 3

## 2013-11-17 NOTE — Progress Notes (Signed)
Subjective: Patient presented with left sided weakness found to have right MCA CVA s/p IA tpa and clot retrieval 11/16/13. Patient is still intubated and family is present today.   Objective: Physical Exam: BP 140/50  Pulse 80  Temp(Src) 98.3 F (36.8 C) (Axillary)  Resp 23  Ht 5\' 8"  (1.727 m)  Wt 230 lb 2.6 oz (104.4 kg)  BMI 35.00 kg/m2  SpO2 100%  General: Awake, follows with eyes, intubated Abd: Soft, ND Ext: R CFA access site dressing C/D/I, no signs of bleeding or hematoma, DP intact bilaterally. Moves all four extremities equally to verbal command  Labs: CBC  Recent Labs  11/16/13 0350 11/17/13 0225  WBC 8.9 12.2*  HGB 9.6* 8.7*  HCT 28.2* 26.1*  PLT 145* 151   BMET  Recent Labs  11/16/13 0350 11/17/13 0225  NA 135* 141  K 4.6 4.9  CL 99 107  CO2 19 18*  GLUCOSE 242* 182*  BUN 38* 43*  CREATININE 1.77* 2.11*  CALCIUM 8.3* 8.2*   LFT  Recent Labs  11/15/13 2117  PROT 6.9  ALBUMIN 3.2*  AST 10  ALT <5  ALKPHOS 94  BILITOT 0.3   PT/INR  Recent Labs  11/15/13 2117  LABPROT 12.9  INR 0.97     Studies/Results: Ct Angio Head W/cm &/or Wo Cm  11/15/2013   CLINICAL DATA:  Code stroke, left facial droop and weakness. History of prostate cancer.  EXAM: CT ANGIOGRAPHY HEAD AND NECK  TECHNIQUE: Multidetector CT imaging of the head and neck was performed using the standard protocol during bolus administration of intravenous contrast. Multiplanar CT image reconstructions and MIPs were obtained to evaluate the vascular anatomy. Carotid stenosis measurements (when applicable) are obtained utilizing NASCET criteria, using the distal internal carotid diameter as the denominator.  CONTRAST:  13mL OMNIPAQUE IOHEXOL 350 MG/ML SOLN  COMPARISON:  CT of the head November 15, 2013 at 2123 hr.  FINDINGS: CTA HEAD FINDINGS  Anterior circulation: Normal appearance of the cervical internal carotid arteries, petrous, cavernous and supra clinoid internal carotid arteries ;  moderate calcific atherosclerosis. At the level of the right middle cerebral artery bifurcation there is complete occlusion of a right M2 branch (likely inferior branch). Due to decreased profusion, there is delayed enhancement of the right cortical veins. Widely patent anterior communicating artery. Normal appearance of the anterior cerebral arteries.  Posterior circulation: Codominant vertebral arteries, with normal appearance of the vertebral arteries, vertebrobasilar junction and basilar artery, as well as main branch vessels. Small bilateral posterior communicating arteries are present. Normal appearance of the posterior cerebral arteries.  No hemodynamically significant stenosis, dissection, luminal irregularity, contrast extravasation or aneurysm within the anterior nor posterior circulation.  Postcontrast imaging of the brain demonstrates no enhancing intracranial lesions. No intraparenchymal hemorrhage.  Review of the MIP images confirms the above findings.  CTA NECK FINDINGS  Normal appearance of the thoracic arch, normal branch pattern. Moderate calcific atherosclerosis. The origins of the innominate, left Common carotid artery and subclavian artery are widely patent. Streak artifact from dental amalgam limits evaluation of the cervical vessels at skull base.  Bilateral Common carotid arteries are widely patent, coursing in a straight line fashion. Medial to that course of the left Common carotid artery, normal variant and could be transient. 1-2 mm eccentric calcific atherosclerosis of the carotid bulbs extending into the internal carotid arteries without hemodynamically significant stenosis by NASCET criteria. Normal appearance of the included internal carotid arteries.  Left vertebral artery is dominant. Normal appearance of  the vertebral arteries, which appear widely patent.  No hemodynamically significant stenosis by NASCET criteria. No dissection, no pseudoaneurysm. No abnormal luminal irregularity.  No contrast extravasation.  Thyromegaly with partially calcified multi lobulated heterogeneous left thyroid mass measuring at least 6.6 x 4.8 x 3.8 cm with substernal extent, at displacing the trachea to the right, splaying the trachea and aorta. Multiple sclerotic lesions in the ribs and vertebral bodies concerning for metastatic disease. Status post median sternotomy.  Review of the MIP images confirms the above findings.  IMPRESSION: CTA head: Acute right M2 occlusive thromboembolism, as seen on prior CT.  CTA neck: No hemodynamically significant stenosis.  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  6.6 x 4.8 x 3.8 cm suspicious left thyroid mass displaces the trachea to the right, recommend follow-up thyroid sonogram on a nonemergent basis.  Preliminary findings discussed by Dr. Eustace Quail, Radiology and reconfirmed by Dr.MCNEILL [facility] on6/28/2015atat approximately 2230 hr.   Electronically Signed   By: Elon Alas   On: 11/15/2013 23:23   Ct Head Wo Contrast  11/16/2013   CLINICAL DATA:  Stroke.  Monitor for edema or hemorrhage  EXAM: CT HEAD WITHOUT CONTRAST  TECHNIQUE: Contiguous axial images were obtained from the base of the skull through the vertex without intravenous contrast.  COMPARISON:  Head CT from the same day at 1:46 a.m.  FINDINGS: Endotracheal and orogastric tubes are again noted. The mastoids, middle ears, and paranasal sinuses are without new opacification. No new soft tissue findings.  Subarachnoid high attenuation in the lower right sylvian fissure and neighboring sulci persists. There has been some redistribution, with more high-density in the peri-insular region, but no definitive increase. There is no clear parenchymal hematoma. There is evidence of cortical effacement and cytotoxic edema involving the upper surface of the right temporal lobe, compatible with infarct. No upper division infarct visible. There is no hydrocephalus, shift, or contralateral  infarct.  These results were called by telephone at the time of interpretation on 11/16/2013 at 11:33 PM to Dr. Leonel Ramsay, who verbally acknowledged these results.  IMPRESSION: 1. Persistent subarachnoid high density around the right sylvian fissure, favoring subarachnoid hemorrhage rather than contrast. There has been redistribution, but no definitive increase. 2. Newly visible infarct in the upper right temporal lobe.   Electronically Signed   By: Jorje Guild M.D.   On: 11/16/2013 23:39   Ct Head Wo Contrast  11/16/2013   CLINICAL DATA:  Status post intervention.  EXAM: CT HEAD WITHOUT CONTRAST  TECHNIQUE: Contiguous axial images were obtained from the base of the skull through the vertex without intravenous contrast.  COMPARISON:  Head CT from yesterday  FINDINGS: There is new nasopharyngeal fluid related to intubation. No acute osseous findings have developed.  There is new serpentine high density in the lower sylvian fissure and neighboring sulci. This appears sulcal rather than cortical. No discrete hematoma. No visible infarct. No hydrocephalus or shift. Generalized brain atrophy and chronic vessel disease is again noted.  These results were called by telephone at the time of interpretation on 11/16/2013 at 2:55 AM to Dr. Leonel Ramsay, who verbally acknowledged these results.  IMPRESSION: 1. Subarachnoid high attenuation along the right sylvian fissure which is often contrast staining, but cannot exclude subarachnoid hemorrhage. Staining should dissipated on short followup head CT. 2. No visible infarct.   Electronically Signed   By: Jorje Guild M.D.   On: 11/16/2013 02:59   Ct Head (brain) Wo Contrast  11/15/2013   CLINICAL DATA:  Code stroke.  Left-sided facial droop and weakness.  EXAM: CT HEAD WITHOUT CONTRAST  TECHNIQUE: Contiguous axial images were obtained from the base of the skull through the vertex without intravenous contrast.  COMPARISON:  None.  FINDINGS: Skull and Sinuses:Negative  for fracture or destructive process. Smoothly contoured opacity in the left sphenoid sinus compatible with mucous retention cyst or less likely polyp.  Orbits: No acute abnormality.  Brain: There is MCA high-density at the level of the M1 M2 junction which given the history is consistent with thrombus. No ischemic changes are seen in the right MCA territory currently. No acute hemorrhage. There is chronic small vessel disease with ischemic gliosis around the lateral ventricles. Generalized brain atrophy. No evidence of mass lesion, hydrocephalus, or shift.  Critical Value/emergent results were called by telephone at the time of interpretation on 11/15/2013 at 10:07 PM to Dr. Leonel Ramsay, who verbally acknowledged these results.  IMPRESSION: 1. Right MCA thrombus at the M1/M2 junction. No ischemic changes currently visible. 2. No acute intracranial hemorrhage. 3. Brain atrophy and chronic small vessel disease.   Electronically Signed   By: Jorje Guild M.D.   On: 11/15/2013 22:10   Ct Angio Neck W/cm &/or Wo/cm  11/15/2013   CLINICAL DATA:  Code stroke, left facial droop and weakness. History of prostate cancer.  EXAM: CT ANGIOGRAPHY HEAD AND NECK  TECHNIQUE: Multidetector CT imaging of the head and neck was performed using the standard protocol during bolus administration of intravenous contrast. Multiplanar CT image reconstructions and MIPs were obtained to evaluate the vascular anatomy. Carotid stenosis measurements (when applicable) are obtained utilizing NASCET criteria, using the distal internal carotid diameter as the denominator.  CONTRAST:  15mL OMNIPAQUE IOHEXOL 350 MG/ML SOLN  COMPARISON:  CT of the head November 15, 2013 at 2123 hr.  FINDINGS: CTA HEAD FINDINGS  Anterior circulation: Normal appearance of the cervical internal carotid arteries, petrous, cavernous and supra clinoid internal carotid arteries ; moderate calcific atherosclerosis. At the level of the right middle cerebral artery bifurcation  there is complete occlusion of a right M2 branch (likely inferior branch). Due to decreased profusion, there is delayed enhancement of the right cortical veins. Widely patent anterior communicating artery. Normal appearance of the anterior cerebral arteries.  Posterior circulation: Codominant vertebral arteries, with normal appearance of the vertebral arteries, vertebrobasilar junction and basilar artery, as well as main branch vessels. Small bilateral posterior communicating arteries are present. Normal appearance of the posterior cerebral arteries.  No hemodynamically significant stenosis, dissection, luminal irregularity, contrast extravasation or aneurysm within the anterior nor posterior circulation.  Postcontrast imaging of the brain demonstrates no enhancing intracranial lesions. No intraparenchymal hemorrhage.  Review of the MIP images confirms the above findings.  CTA NECK FINDINGS  Normal appearance of the thoracic arch, normal branch pattern. Moderate calcific atherosclerosis. The origins of the innominate, left Common carotid artery and subclavian artery are widely patent. Streak artifact from dental amalgam limits evaluation of the cervical vessels at skull base.  Bilateral Common carotid arteries are widely patent, coursing in a straight line fashion. Medial to that course of the left Common carotid artery, normal variant and could be transient. 1-2 mm eccentric calcific atherosclerosis of the carotid bulbs extending into the internal carotid arteries without hemodynamically significant stenosis by NASCET criteria. Normal appearance of the included internal carotid arteries.  Left vertebral artery is dominant. Normal appearance of the vertebral arteries, which appear widely patent.  No hemodynamically significant stenosis by NASCET criteria. No dissection, no pseudoaneurysm. No  abnormal luminal irregularity. No contrast extravasation.  Thyromegaly with partially calcified multi lobulated heterogeneous  left thyroid mass measuring at least 6.6 x 4.8 x 3.8 cm with substernal extent, at displacing the trachea to the right, splaying the trachea and aorta. Multiple sclerotic lesions in the ribs and vertebral bodies concerning for metastatic disease. Status post median sternotomy.  Review of the MIP images confirms the above findings.  IMPRESSION: CTA head: Acute right M2 occlusive thromboembolism, as seen on prior CT.  CTA neck: No hemodynamically significant stenosis.  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  6.6 x 4.8 x 3.8 cm suspicious left thyroid mass displaces the trachea to the right, recommend follow-up thyroid sonogram on a nonemergent basis.  Preliminary findings discussed by Dr. Eustace Quail, Radiology and reconfirmed by Dr.MCNEILL [facility] on6/28/2015atat approximately 2230 hr.   Electronically Signed   By: Elon Alas   On: 11/15/2013 23:23   US Soft Tissue Head/neck  11/17/2013   CLINICAL DATA:  Thyroid mass  EXAM: THYROID ULTRASOUND  TECHNIQUE: Ultrasound examination of the thyroid gland and adjacent soft tissues was performed.  COMPARISON:  CT angio neck 11/15/2013  FINDINGS: Right thyroid lobe  Measurements: 4.2 x 2.5 x 2.2 cm. 9 x 5 x 9 mm hypoechoic solid nodule in the interpolar region.  Left thyroid lobe  Measurements: The left lobe was not measured. 3.0 x 1.7 x 3.0 cm hypoechoic solid mass in the lower pole of the left lobe.  Isthmus  Thickness: 3 mm.  No nodules visualized.  Lymphadenopathy  None visualized.  IMPRESSION: Dominant solid left lower pole nodule measuring 3.0 cm. Findings meet consensus criteria for biopsy. Ultrasound-guided fine needle aspiration should be considered, as per the consensus statement: Management of Thyroid Nodules Detected at [place]: Society of Radiologists in [place]. Radiology 2005; [id number].   Electronically Signed   By: Maryclare Bean M.D.   On: 11/17/2013 08:35   Ir Thrombect Prim Mech Init (inclu)  Mod Sed  11/17/2013   CLINICAL DATA:  Acute onset of left-sided paralysis, speech difficulties and right gaze deviation. Right middle cerebral artery hyperdense sign on CT scan.  EXAM: THROMBECTOMY MECHANICAL ; IR NEURO EACH ADD'L AFTER BASIC UNI RIGHT (MS); IR ANGIO INTRA EXTRACRAN SEL INTERNAL CAROTID UNI RIGHT MOD SED  ANESTHESIA/SEDATION: General anesthesia.  MEDICATIONS: As per general anesthesia.  CONTRAST:  125mL OMNIPAQUE IOHEXOL 300 MG/ML  SOLN  PROCEDURE: Following a full explanation of the procedure along with the potential associated complications, an informed witnessed consent was obtained.  The patient was put under general anesthesia by the Department of Anesthesiology at [facility].  The right groin was prepped and draped in the usual sterile fashion. Thereafter using a modified Seldinger technique, transfemoral access into the right common femoral artery was obtained without difficulty. Over a 0.035 inch guidewire, a 5 French Pinnacle sheath was inserted. Through this, and also over a 0.035 inch guidewire, a 5 French JB1 catheter was advanced to the aortic arch region and selectively positioned in the right common carotid artery. Arteriograms were then performed centered over the carotid bifurcation and intracranially. COMPLICATIONS:  COMPLICATIONS: None immediate  FINDINGS: The common carotid arteriogram demonstrates the right external carotid artery and its major branches to be normal.  The right internal carotid artery at the bulb and its proximal one-third is normal.  There is mild-to-moderate tortuosity of the junction of the proximal one-third with the middle one-third of the right internal carotid artery.  More distally the  vessel opacifies normally to the cranial skull base.  The petrous, the cavernous and the supraclinoid segments are widely patent.  The right middle and the right anterior cerebral arteries are seen to opacify into the capillary and venous phases. Cross  opacification via the anterior communicating artery of the left anterior cerebral artery A2 segment is seen.  A prominent occlusion is demonstrated of the prominent inferior division of the right middle cerebral artery.  The right posterior communicating artery opacifies the right posterior cerebral artery distribution.  ENDOVASCULAR COMPLETE REVASCULARIZATION OF OCCLUDED DOMINANT INFERIOR DIVISION OF THE RIGHT MIDDLE CEREBRAL ARTERY USING SUPERSELECTIVE INTRACRANIAL INTRA-ARTERIAL TPA AND MECHANICAL THROMBECTOMY WITH THE TREVO PROVUE AND SOLITAIRE FR DEVICES:  The angiographic findings have been reviewed with the referring neurologist.  The option of endovascular revascularization to prevent further neurological injury, and to subsequently help with neurological recovery was discussed with the patient's family.  The risks, benefits and alternatives were reviewed in detail.  The risk of 10-15% of intracranial hemorrhage, worsening neurological deficit, ventilator dependency, death and inability to revascularization were all reviewed in detail.  Informed consent was obtained.  The diagnostic JB1 catheter in the right common carotid artery was exchanged over a 0.035 inch 300 cm Rosen exchange guidewire for an 8 French 55 cm Brite tip neurovascular sheath using biplane roadmap technique and constant fluoroscopic guidance. Good aspiration was obtained from the hub of the neurovascular sheath. A gentle contrast injection demonstrated no evidence of spasms, dissections or of intraluminal filling defects.  This was then connected to continuous heparinized saline fusion.  Over the [facility] exchange guidewire, a 90 cm 8 [place] Merci balloon guide catheter which had been prepped and purged with 75% contrast and 25% heparinized saline infusion was then advanced and positioned in the proximal one-third of the right internal carotid artery. The guidewire was removed. Good aspiration was obtained from the hub of the Merci guide  catheter.  A gentle contrast injection demonstrated no evidence of spasms, dissections or of intraluminal filling defects.  At this time in a coaxial manner and with constant heparinized saline fusion using biplane roadmap technique and constant fluoroscopic guidance, the combination of the Merci 18L micro catheter which had been shaped with a 125 DAC catheter over a 0.014 inch Softtip Synchro micro guidewire combination was advanced to the distal end of the Merci guide catheter.  A using a torque device with the micro guidewire J-tip configuration at its distal end inducing spasm. The guidewire was manipulated to advance into the right middle cerebral artery and into the occluded prominent inferior division of the right middle cerebral artery. The micro guidewire was advanced through the clot distally into the M2 M3 region followed by the micro catheter without difficulty.  The micro guidewire was removed. Slow aspiration of blood was obtained from the hub of the 18L micro catheter.  Approximately 10 mL of superselective intracranial intra-arterial tPA dissolved in 10 mL of normal saline was then infused over approximately 12 minutes. Good aspiration was obtained from the hub of the micro catheter following this.  At this time a Trevo ProVue 3 mm x 20 mm stent retrieval device was then advanced in a coaxial manner and with constant heparinized saline fusion using biplane roadmap technique and constant fluoroscopic guidance to the distal end of the 18L micro catheter.  The O rings on the delivery micro guidewire and the delivery micro catheter were then loosened.  With slight forward gentle traction with the right hand on the delivery micro  guidewire, the delivery micro catheter was then retrieved to just proximal to the proximal marker on the stent retrieval device. The retrieval device was then maintained and deployed for approximately 3 minutes.  At the end of this, the micro catheter was advanced just passed the  proximal marker on the stent retriever.  The balloon in the right internal carotid artery was then inflated for proximal flow arrest. Using constant aspiration with a 60 mL syringe at the side port of the Tuohy-Borst at the hub of the 8 [place] Merci guide catheter, the combination of the retrieval device, the [facility] 125 catheter and the 18L Merci micro catheter were then retrieved as constant aspiration was performed.  The combinations were retrieved and removed.  Small specks of altered blood were seen on the retrieval device. The aspirate did not show any clot. With aspiration in place, the balloon was deflated in the proximal right internal carotid artery. Back bleed was allowed.  A control arteriogram performed through the 8 [place] Merci guide catheter demonstrated no significant change in the occluded inferior division.  This prompted a second pass with the micro catheter and the [facility] 125 catheter combination being advanced as described above again just distal to the clot. The micro guidewire was removed. Approximately 3 mL of superselective intracranial intra-arterial tPA was then given. This was then followed by the advancement of 4 mm x 20 mm Solitaire FR retrieval device which was advanced and deployed as described above without difficulty. The deployed Solitaire was left in that position for approximately 2 minutes.  At the end of this, the proximal portion of the retrieval device was then captured into the delivery micro catheter.  With proximal flow arrest by inflating the balloon in the right internal carotid artery of the Merci guide catheter, the combination of the retrieval device, the 18L micro catheter and the [facility] 125 catheter was then retrieved as constant aspiration was applied at the hub of the 8 [place] Merci guide catheter at the Tuohy-Borst using a 60 mL syringe.  The combination was then retrieved and removed. Aspiration was continued while the balloon of the guide catheter was being deflated.   Back bleed was allowed. A control arteriogram performed using the 8 [place] Merci guide catheter in the right internal carotid artery demonstrated complete angiographic revascularization of the previously occluded inferior division.  Aspirate revealed the presence of a 3 mm x 2.5 mm blood stained clot, with a fibrous component.  A final control arteriogram performed via the 8 [place] Merci guide catheter demonstrated complete angiographic revascularization of the occluded right middle cerebral artery distribution. The right posterior communicating artery remained patent as well. Throughout the procedure there were no acute hemodynamic or neurological complications.  The 8 [place] Merci guide catheter and the 8 French neurovascular sheath were then retrieved into the abdominal aorta and exchanged over a J-tip guidewire for a 9 [place] Pinnacle sheath. This was then connected to continuous heparinized saline infusion.  The patient was then transported to the CT scan suite for postprocedural CT scan of the brain.  IMPRESSION: Status post endovascular complete revascularization of occluded dominant inferior division of the right middle cerebral artery using approximately 13 mg of superselective intracranial tPA followed by one pass with the Trevo ProVue 29mm x 20 mm retrieval device, and one pass with the 85mm x 20 mm Solitaire FR retrieval device.   Electronically Signed   By: Luanne Bras M.D.   On: 11/16/2013 15:43   Dg Chest Port 1  View  11/17/2013   CLINICAL DATA:  Endotracheal tube  EXAM: PORTABLE CHEST - 1 VIEW  COMPARISON:  11/16/2013  FINDINGS: Mild patchy bibasilar opacities, likely atelectasis. Possible small left pleural effusion. No pneumothorax.  The heart is top-normal in size. Postsurgical changes related to prior CABG. Right subclavian pacemaker.  Endotracheal tube terminates 5 cm above the carina.  Enteric tube courses below the diaphragm.  IMPRESSION: Endotracheal tube terminates 5 cm above the  carina.  Mild patchy bibasilar opacities, likely atelectasis.  Possible small left pleural effusion.   Electronically Signed   By: Julian Hy M.D.   On: 11/17/2013 07:51   Dg Chest Port 1 View  11/16/2013   CLINICAL DATA:  Re-intubation  EXAM: PORTABLE CHEST - 1 VIEW  COMPARISON:  Portable chest x-ray of November 16, 2013 at 5:45 a.m.  FINDINGS: The endotracheal tube tip lies approximately 3 cm above the crotch of the carina. There remains mild bilateral pulmonary hypoinflation greater on the right than on the left. Density at the left lung base is less conspicuous. The cardiac silhouette is mildly enlarged though stable. The left hemidiaphragm is better demonstrated on the current study. The esophagogastric tube has been removed. The observed portions of the permanent pacemaker are normal. 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  IMPRESSION: The endotracheal tube tip is in reasonable position. There is slight improved appearance of the lungs since the earlier study.   Electronically Signed   By: David  Martinique   On: 11/16/2013 09:47   Dg Chest Port 1 View  11/16/2013   CLINICAL DATA:  Recent stroke  EXAM: PORTABLE CHEST - 1 VIEW  COMPARISON:  08/19/2012  FINDINGS: An endotracheal tube is noted 6 cm above the carina. Nasogastric catheter is noted coiled within the stomach. A pacing device is noted on the right and stable. Postoperative changes are seen as well. Cardiac shadow is stable. Left basilar atelectatic changes are noted. No focal confluent infiltrate is seen. No bony abnormality is noted.  IMPRESSION: Left basilar atelectasis.  Tubes and lines as described.   Electronically Signed   By: Inez Catalina M.D.   On: 11/16/2013 08:01   Dg Abd Portable 1v  11/16/2013   CLINICAL DATA:  Assess orogastric tube position  EXAM: PORTABLE ABDOMEN - 1 VIEW  COMPARISON:  None.  FINDINGS: The orogastric tube tip in proximal port lie below the GE junction in the region of the  gastric cardia. There is [place] a moderate amount of gas within the stomach. There is contrast within distended renal pelves and calices bilaterally. There is atelectasis or infiltrate at the left lung base.  IMPRESSION: The orogastric tube tip in proximal port lie in the region of the gastric cardia.   Electronically Signed   By: David  Martinique   On: 11/16/2013 11:04   Ir Angio Intra Extracran Sel Internal Carotid Uni R Mod Sed  11/17/2013   CLINICAL DATA:  Acute onset of left-sided paralysis, speech difficulties and right gaze deviation. Right middle cerebral artery hyperdense sign on CT scan.  EXAM: THROMBECTOMY MECHANICAL ; IR NEURO EACH ADD'L AFTER BASIC UNI RIGHT (MS); IR ANGIO INTRA EXTRACRAN SEL INTERNAL CAROTID UNI RIGHT MOD SED  ANESTHESIA/SEDATION: General anesthesia.  MEDICATIONS: As per general anesthesia.  CONTRAST:  184mL OMNIPAQUE IOHEXOL 300 MG/ML  SOLN  PROCEDURE: Following a full explanation of the procedure along with the potential associated complications, an informed witnessed consent was obtained.  The patient was put under  general anesthesia by the Department of Anesthesiology at [facility].  The right groin was prepped and draped in the usual sterile fashion. Thereafter using a modified Seldinger technique, transfemoral access into the right common femoral artery was obtained without difficulty. Over a 0.035 inch guidewire, a 5 French Pinnacle sheath was inserted. Through this, and also over a 0.035 inch guidewire, a 5 French JB1 catheter was advanced to the aortic arch region and selectively positioned in the right common carotid artery. Arteriograms were then performed centered over the carotid bifurcation and intracranially. COMPLICATIONS:  COMPLICATIONS: None immediate  FINDINGS: The common carotid arteriogram demonstrates the right external carotid artery and its major branches to be normal.  The right internal carotid artery at the bulb and its proximal one-third is normal.   There is mild-to-moderate tortuosity of the junction of the proximal one-third with the middle one-third of the right internal carotid artery.  More distally the vessel opacifies normally to the cranial skull base.  The petrous, the cavernous and the supraclinoid segments are widely patent.  The right middle and the right anterior cerebral arteries are seen to opacify into the capillary and venous phases. Cross opacification via the anterior communicating artery of the left anterior cerebral artery A2 segment is seen.  A prominent occlusion is demonstrated of the prominent inferior division of the right middle cerebral artery.  The right posterior communicating artery opacifies the right posterior cerebral artery distribution.  ENDOVASCULAR COMPLETE REVASCULARIZATION OF OCCLUDED DOMINANT INFERIOR DIVISION OF THE RIGHT MIDDLE CEREBRAL ARTERY USING SUPERSELECTIVE INTRACRANIAL INTRA-ARTERIAL TPA AND MECHANICAL THROMBECTOMY WITH THE TREVO PROVUE AND SOLITAIRE FR DEVICES:  The angiographic findings have been reviewed with the referring neurologist.  The option of endovascular revascularization to prevent further neurological injury, and to subsequently help with neurological recovery was discussed with the patient's family.  The risks, benefits and alternatives were reviewed in detail.  The risk of 10-15% of intracranial hemorrhage, worsening neurological deficit, ventilator dependency, death and inability to revascularization were all reviewed in detail.  Informed consent was obtained.  The diagnostic JB1 catheter in the right common carotid artery was exchanged over a 0.035 inch 300 cm Rosen exchange guidewire for an 8 French 55 cm Brite tip neurovascular sheath using biplane roadmap technique and constant fluoroscopic guidance. Good aspiration was obtained from the hub of the neurovascular sheath. A gentle contrast injection demonstrated no evidence of spasms, dissections or of intraluminal filling defects.  This was  then connected to continuous heparinized saline fusion.  Over the [facility] exchange guidewire, a 90 cm 8 [place] Merci balloon guide catheter which had been prepped and purged with 75% contrast and 25% heparinized saline infusion was then advanced and positioned in the proximal one-third of the right internal carotid artery. The guidewire was removed. Good aspiration was obtained from the hub of the Merci guide catheter.  A gentle contrast injection demonstrated no evidence of spasms, dissections or of intraluminal filling defects.  At this time in a coaxial manner and with constant heparinized saline fusion using biplane roadmap technique and constant fluoroscopic guidance, the combination of the Merci 18L micro catheter which had been shaped with a 125 DAC catheter over a 0.014 inch Softtip Synchro micro guidewire combination was advanced to the distal end of the Merci guide catheter.  A using a torque device with the micro guidewire J-tip configuration at its distal end inducing spasm. The guidewire was manipulated to advance into the right middle cerebral artery and into the occluded prominent inferior  division of the right middle cerebral artery. The micro guidewire was advanced through the clot distally into the M2 M3 region followed by the micro catheter without difficulty.  The micro guidewire was removed. Slow aspiration of blood was obtained from the hub of the 18L micro catheter.  Approximately 10 mL of superselective intracranial intra-arterial tPA dissolved in 10 mL of normal saline was then infused over approximately 12 minutes. Good aspiration was obtained from the hub of the micro catheter following this.  At this time a Trevo ProVue 3 mm x 20 mm stent retrieval device was then advanced in a coaxial manner and with constant heparinized saline fusion using biplane roadmap technique and constant fluoroscopic guidance to the distal end of the 18L micro catheter.  The O rings on the delivery micro guidewire  and the delivery micro catheter were then loosened.  With slight forward gentle traction with the right hand on the delivery micro guidewire, the delivery micro catheter was then retrieved to just proximal to the proximal marker on the stent retrieval device. The retrieval device was then maintained and deployed for approximately 3 minutes.  At the end of this, the micro catheter was advanced just passed the proximal marker on the stent retriever.  The balloon in the right internal carotid artery was then inflated for proximal flow arrest. Using constant aspiration with a 60 mL syringe at the side port of the Tuohy-Borst at the hub of the 8 [place] Merci guide catheter, the combination of the retrieval device, the [facility] 125 catheter and the 18L Merci micro catheter were then retrieved as constant aspiration was performed.  The combinations were retrieved and removed.  Small specks of altered blood were seen on the retrieval device. The aspirate did not show any clot. With aspiration in place, the balloon was deflated in the proximal right internal carotid artery. Back bleed was allowed.  A control arteriogram performed through the 8 [place] Merci guide catheter demonstrated no significant change in the occluded inferior division.  This prompted a second pass with the micro catheter and the [facility] 125 catheter combination being advanced as described above again just distal to the clot. The micro guidewire was removed. Approximately 3 mL of superselective intracranial intra-arterial tPA was then given. This was then followed by the advancement of 4 mm x 20 mm Solitaire FR retrieval device which was advanced and deployed as described above without difficulty. The deployed Solitaire was left in that position for approximately 2 minutes.  At the end of this, the proximal portion of the retrieval device was then captured into the delivery micro catheter.  With proximal flow arrest by inflating the balloon in the right internal  carotid artery of the Merci guide catheter, the combination of the retrieval device, the 18L micro catheter and the [facility] 125 catheter was then retrieved as constant aspiration was applied at the hub of the 8 [place] Merci guide catheter at the Tuohy-Borst using a 60 mL syringe.  The combination was then retrieved and removed. Aspiration was continued while the balloon of the guide catheter was being deflated.  Back bleed was allowed. A control arteriogram performed using the 8 [place] Merci guide catheter in the right internal carotid artery demonstrated complete angiographic revascularization of the previously occluded inferior division.  Aspirate revealed the presence of a 3 mm x 2.5 mm blood stained clot, with a fibrous component.  A final control arteriogram performed via the 8 [place] Merci guide catheter demonstrated complete angiographic revascularization of the  occluded right middle cerebral artery distribution. The right posterior communicating artery remained patent as well. Throughout the procedure there were no acute hemodynamic or neurological complications.  The 8 [place] Merci guide catheter and the 8 French neurovascular sheath were then retrieved into the abdominal aorta and exchanged over a J-tip guidewire for a 9 [place] Pinnacle sheath. This was then connected to continuous heparinized saline infusion.  The patient was then transported to the CT scan suite for postprocedural CT scan of the brain.  IMPRESSION: Status post endovascular complete revascularization of occluded dominant inferior division of the right middle cerebral artery using approximately 13 mg of superselective intracranial tPA followed by one pass with the Trevo ProVue 82mm x 20 mm retrieval device, and one pass with the 31mm x 20 mm Solitaire FR retrieval device.   Electronically Signed   By: Luanne Bras M.D.   On: 11/16/2013 15:43   Ir Neuro Each Add'l After Basic Uni Right (ms)  11/17/2013   CLINICAL DATA:  Acute onset of  left-sided paralysis, speech difficulties and right gaze deviation. Right middle cerebral artery hyperdense sign on CT scan.  EXAM: THROMBECTOMY MECHANICAL ; IR NEURO EACH ADD'L AFTER BASIC UNI RIGHT (MS); IR ANGIO INTRA EXTRACRAN SEL INTERNAL CAROTID UNI RIGHT MOD SED  ANESTHESIA/SEDATION: General anesthesia.  MEDICATIONS: As per general anesthesia.  CONTRAST:  170mL OMNIPAQUE IOHEXOL 300 MG/ML  SOLN  PROCEDURE: Following a full explanation of the procedure along with the potential associated complications, an informed witnessed consent was obtained.  The patient was put under general anesthesia by the Department of Anesthesiology at [facility].  The right groin was prepped and draped in the usual sterile fashion. Thereafter using a modified Seldinger technique, transfemoral access into the right common femoral artery was obtained without difficulty. Over a 0.035 inch guidewire, a 5 French Pinnacle sheath was inserted. Through this, and also over a 0.035 inch guidewire, a 5 French JB1 catheter was advanced to the aortic arch region and selectively positioned in the right common carotid artery. Arteriograms were then performed centered over the carotid bifurcation and intracranially. COMPLICATIONS:  COMPLICATIONS: None immediate  FINDINGS: The common carotid arteriogram demonstrates the right external carotid artery and its major branches to be normal.  The right internal carotid artery at the bulb and its proximal one-third is normal.  There is mild-to-moderate tortuosity of the junction of the proximal one-third with the middle one-third of the right internal carotid artery.  More distally the vessel opacifies normally to the cranial skull base.  The petrous, the cavernous and the supraclinoid segments are widely patent.  The right middle and the right anterior cerebral arteries are seen to opacify into the capillary and venous phases. Cross opacification via the anterior communicating artery of the left  anterior cerebral artery A2 segment is seen.  A prominent occlusion is demonstrated of the prominent inferior division of the right middle cerebral artery.  The right posterior communicating artery opacifies the right posterior cerebral artery distribution.  ENDOVASCULAR COMPLETE REVASCULARIZATION OF OCCLUDED DOMINANT INFERIOR DIVISION OF THE RIGHT MIDDLE CEREBRAL ARTERY USING SUPERSELECTIVE INTRACRANIAL INTRA-ARTERIAL TPA AND MECHANICAL THROMBECTOMY WITH THE TREVO PROVUE AND SOLITAIRE FR DEVICES:  The angiographic findings have been reviewed with the referring neurologist.  The option of endovascular revascularization to prevent further neurological injury, and to subsequently help with neurological recovery was discussed with the patient's family.  The risks, benefits and alternatives were reviewed in detail.  The risk of 10-15% of intracranial hemorrhage, worsening neurological deficit,  ventilator dependency, death and inability to revascularization were all reviewed in detail.  Informed consent was obtained.  The diagnostic JB1 catheter in the right common carotid artery was exchanged over a 0.035 inch 300 cm Rosen exchange guidewire for an 8 French 55 cm Brite tip neurovascular sheath using biplane roadmap technique and constant fluoroscopic guidance. Good aspiration was obtained from the hub of the neurovascular sheath. A gentle contrast injection demonstrated no evidence of spasms, dissections or of intraluminal filling defects.  This was then connected to continuous heparinized saline fusion.  Over the [facility] exchange guidewire, a 90 cm 8 [place] Merci balloon guide catheter which had been prepped and purged with 75% contrast and 25% heparinized saline infusion was then advanced and positioned in the proximal one-third of the right internal carotid artery. The guidewire was removed. Good aspiration was obtained from the hub of the Merci guide catheter.  A gentle contrast injection demonstrated no evidence of  spasms, dissections or of intraluminal filling defects.  At this time in a coaxial manner and with constant heparinized saline fusion using biplane roadmap technique and constant fluoroscopic guidance, the combination of the Merci 18L micro catheter which had been shaped with a 125 DAC catheter over a 0.014 inch Softtip Synchro micro guidewire combination was advanced to the distal end of the Merci guide catheter.  A using a torque device with the micro guidewire J-tip configuration at its distal end inducing spasm. The guidewire was manipulated to advance into the right middle cerebral artery and into the occluded prominent inferior division of the right middle cerebral artery. The micro guidewire was advanced through the clot distally into the M2 M3 region followed by the micro catheter without difficulty.  The micro guidewire was removed. Slow aspiration of blood was obtained from the hub of the 18L micro catheter.  Approximately 10 mL of superselective intracranial intra-arterial tPA dissolved in 10 mL of normal saline was then infused over approximately 12 minutes. Good aspiration was obtained from the hub of the micro catheter following this.  At this time a Trevo ProVue 3 mm x 20 mm stent retrieval device was then advanced in a coaxial manner and with constant heparinized saline fusion using biplane roadmap technique and constant fluoroscopic guidance to the distal end of the 18L micro catheter.  The O rings on the delivery micro guidewire and the delivery micro catheter were then loosened.  With slight forward gentle traction with the right hand on the delivery micro guidewire, the delivery micro catheter was then retrieved to just proximal to the proximal marker on the stent retrieval device. The retrieval device was then maintained and deployed for approximately 3 minutes.  At the end of this, the micro catheter was advanced just passed the proximal marker on the stent retriever.  The balloon in the right  internal carotid artery was then inflated for proximal flow arrest. Using constant aspiration with a 60 mL syringe at the side port of the Tuohy-Borst at the hub of the 8 [place] Merci guide catheter, the combination of the retrieval device, the [facility] 125 catheter and the 18L Merci micro catheter were then retrieved as constant aspiration was performed.  The combinations were retrieved and removed.  Small specks of altered blood were seen on the retrieval device. The aspirate did not show any clot. With aspiration in place, the balloon was deflated in the proximal right internal carotid artery. Back bleed was allowed.  A control arteriogram performed through the 8 [place] Merci guide catheter  demonstrated no significant change in the occluded inferior division.  This prompted a second pass with the micro catheter and the [facility] 125 catheter combination being advanced as described above again just distal to the clot. The micro guidewire was removed. Approximately 3 mL of superselective intracranial intra-arterial tPA was then given. This was then followed by the advancement of 4 mm x 20 mm Solitaire FR retrieval device which was advanced and deployed as described above without difficulty. The deployed Solitaire was left in that position for approximately 2 minutes.  At the end of this, the proximal portion of the retrieval device was then captured into the delivery micro catheter.  With proximal flow arrest by inflating the balloon in the right internal carotid artery of the Merci guide catheter, the combination of the retrieval device, the 18L micro catheter and the [facility] 125 catheter was then retrieved as constant aspiration was applied at the hub of the 8 [place] Merci guide catheter at the Tuohy-Borst using a 60 mL syringe.  The combination was then retrieved and removed. Aspiration was continued while the balloon of the guide catheter was being deflated.  Back bleed was allowed. A control arteriogram performed using the 8  [place] Merci guide catheter in the right internal carotid artery demonstrated complete angiographic revascularization of the previously occluded inferior division.  Aspirate revealed the presence of a 3 mm x 2.5 mm blood stained clot, with a fibrous component.  A final control arteriogram performed via the 8 [place] Merci guide catheter demonstrated complete angiographic revascularization of the occluded right middle cerebral artery distribution. The right posterior communicating artery remained patent as well. Throughout the procedure there were no acute hemodynamic or neurological complications.  The 8 [place] Merci guide catheter and the 8 French neurovascular sheath were then retrieved into the abdominal aorta and exchanged over a J-tip guidewire for a 9 [place] Pinnacle sheath. This was then connected to continuous heparinized saline infusion.  The patient was then transported to the CT scan suite for postprocedural CT scan of the brain.  IMPRESSION: Status post endovascular complete revascularization of occluded dominant inferior division of the right middle cerebral artery using approximately 13 mg of superselective intracranial tPA followed by one pass with the Trevo ProVue 83mm x 20 mm retrieval device, and one pass with the 35mm x 20 mm Solitaire FR retrieval device.   Electronically Signed   By: Luanne Bras M.D.   On: 11/16/2013 15:43    Assessment/Plan: CVA with left sided weakness Right MCA revascularization IA tpa/clot retrieval 6/29, not on antiplatelet at this time secondary to [facility] Still on vent with difficulty weaning earlier, but now with improved status, PCCM to re-evaluate for weaning per RN Plans per neurology Imaging and findings of today's exam d/w Dr. Estanislado Pandy History of GI bleed History of metastatic prostate cancer Atrial fibrillation not previously on anticoagulation secondary to above. Fever, now on Unasyn possible aspiration pneumonia.     LOS: 2 days    Hedy Jacob PA-C 11/17/2013 1:06 PM

## 2013-11-17 NOTE — Progress Notes (Signed)
PULMONARY / CRITICAL CARE MEDICINE  Name: Jeffery Gibson MRN: [id number] DOB: 10/27/1928    ADMISSION DATE:  11/15/2013 CONSULTATION DATE:  11/15/2013  REFERRING MD :  Roland Rack, MD PRIMARY SERVICE: Neurology  CHIEF COMPLAINT:  CVA, acute respiratory failure  BRIEF PATIENT DESCRIPTION:  80 yo with CAD post CABG, AF ( not on anticoagulation due to GI bleed ), pacemaker, HTN, DM, CKD. Presents with left sided weakness and found to have a right MCA stroke. Intubated for airway protection. Underwent systemic and catheter directed TPA.   SIGNIFICANT EVENTS / STUDIES:  6/28  Head CT >>> R MCA thrombus at the M1/M2 junction 6/28  Systemic and catheter directed TPA 6/29  Failed exntubation due to inability to protect airway, reintubated 6/29  TTE >>> EF 65%, no RWMA, grade 1 DD   LINES / TUBES: OETT 6/28 >>> 6/29; 6/29 >>> OGT 6/29 >>> Foley 6/28 >>>  CULTURES: 6/30  Urine >>> 6/30  Respiratory >>>  ANTIBIOTICS: Unasyn 6/30 >>>  INTERVAL HISTORY: Fever overnight.  Awaiting family decision on tracheostomy.  VITAL SIGNS: Temp:  [97.5 F (36.4 C)-100.8 F (38.2 C)] 100.8 F (38.2 C) (06/30 0320) Pulse Rate:  [57-106] 106 (06/30 0800) Resp:  [14-23] 19 (06/30 0800) BP: (102-152)/(41-110) 132/58 mmHg (06/30 0800) SpO2:  [97 %-100 %] 100 % (06/30 0800) Arterial Line BP: (118-146)/(45-58) 132/49 mmHg (06/29 1330) FiO2 (%):  [40 %] 40 % (06/30 0800) Weight:  [101.4 kg (223 lb 8.7 oz)-104.4 kg (230 lb 2.6 oz)] 104.4 kg (230 lb 2.6 oz) (06/30 0403)  HEMODYNAMICS:   VENTILATOR SETTINGS: Vent Mode:  [-] PRVC FiO2 (%):  [40 %] 40 % Set Rate:  [12 bmp] 12 bmp Vt Set:  [650 mL] 650 mL PEEP:  [5 cmH20] 5 cmH20 Pressure Support:  [5 cmH20] 5 cmH20 Plateau Pressure:  [14 cmH20-17 cmH20] 17 cmH20  INTAKE / OUTPUT: Intake/Output     06/29 0701 - 06/30 0700 06/30 0701 - 07/01 0700   I.V. (mL/kg) 1145.2 (11)    NG/GT 602.5 30   Total Intake(mL/kg) 1747.7 (16.7) 30 (0.3)    Urine (mL/kg/hr) 1506 (0.6)    Total Output 1506     Net +241.7 +30          PHYSICAL EXAMINATION: General:  Mechanically ventilated, synchronous Neuro:  Arouses to stimulation, follows some commands, cough / gag diminished HEENT:  PERRL, OETT / OGT Cardiovascular:  RRR, no m/r/g Lungs:  Bilateral diminished air entry, few rhonchi Abdomen:  Soft, nontender, bowel sounds diminished Musculoskeletal:  Moves all extremities, no edema Skin:  Intact  LABS:  CBC  Recent Labs Lab 11/15/13 2117 11/15/13 2125 11/16/13 0350 11/17/13 0225  WBC 7.3  --  8.9 12.2*  HGB 11.1* 11.2* 9.6* 8.7*  HCT 32.4* 33.0* 28.2* 26.1*  PLT 175  --  145* 151   Coag's  Recent Labs Lab 11/15/13 2117  APTT 24  INR 0.97   BMET  Recent Labs Lab 11/15/13 2117 11/15/13 2125 11/16/13 0350 11/17/13 0225  NA 137 134* 135* 141  K 4.6 4.4 4.6 4.9  CL 98 102 99 107  CO2 21  --  19 18*  BUN 42* 38* 38* 43*  CREATININE 2.03* 2.30* 1.77* 2.11*  GLUCOSE 152* 153* 242* 182*   Electrolytes  Recent Labs Lab 11/15/13 2117 11/16/13 0350 11/17/13 0225  CALCIUM 9.6 8.3* 8.2*   Sepsis Markers No results found for this basename: LATICACIDVEN, PROCALCITON, O2SATVEN,  in the last 168 hours  ABG  No results found for this basename: PHART, PCO2ART, PO2ART,  in the last 168 hours  Liver Enzymes  Recent Labs Lab 11/15/13 2117  AST 10  ALT <5  ALKPHOS 94  BILITOT 0.3  ALBUMIN 3.2*   Cardiac Enzymes No results found for this basename: TROPONINI, PROBNP,  in the last 168 hours Glucose  Recent Labs Lab 11/16/13 0759 11/16/13 1219 11/16/13 1553 11/16/13 1939 11/16/13 2345 11/17/13 0319  GLUCAP 230* 168* 110* 159* 162* 201*   IMAGING:   Ct Angio Head W/cm &/or Wo Cm  11/15/2013   CLINICAL DATA:  Code stroke, left facial droop and weakness. History of prostate cancer.  EXAM: CT ANGIOGRAPHY HEAD AND NECK  TECHNIQUE: Multidetector CT imaging of the head and neck was performed using the  standard protocol during bolus administration of intravenous contrast. Multiplanar CT image reconstructions and MIPs were obtained to evaluate the vascular anatomy. Carotid stenosis measurements (when applicable) are obtained utilizing NASCET criteria, using the distal internal carotid diameter as the denominator.  CONTRAST:  32mL OMNIPAQUE IOHEXOL 350 MG/ML SOLN  COMPARISON:  CT of the head November 15, 2013 at 2123 hr.  FINDINGS: CTA HEAD FINDINGS  Anterior circulation: Normal appearance of the cervical internal carotid arteries, petrous, cavernous and supra clinoid internal carotid arteries ; moderate calcific atherosclerosis. At the level of the right middle cerebral artery bifurcation there is complete occlusion of a right M2 branch (likely inferior branch). Due to decreased profusion, there is delayed enhancement of the right cortical veins. Widely patent anterior communicating artery. Normal appearance of the anterior cerebral arteries.  Posterior circulation: Codominant vertebral arteries, with normal appearance of the vertebral arteries, vertebrobasilar junction and basilar artery, as well as main branch vessels. Small bilateral posterior communicating arteries are present. Normal appearance of the posterior cerebral arteries.  No hemodynamically significant stenosis, dissection, luminal irregularity, contrast extravasation or aneurysm within the anterior nor posterior circulation.  Postcontrast imaging of the brain demonstrates no enhancing intracranial lesions. No intraparenchymal hemorrhage.  Review of the MIP images confirms the above findings.  CTA NECK FINDINGS  Normal appearance of the thoracic arch, normal branch pattern. Moderate calcific atherosclerosis. The origins of the innominate, left Common carotid artery and subclavian artery are widely patent. Streak artifact from dental amalgam limits evaluation of the cervical vessels at skull base.  Bilateral Common carotid arteries are widely patent,  coursing in a straight line fashion. Medial to that course of the left Common carotid artery, normal variant and could be transient. 1-2 mm eccentric calcific atherosclerosis of the carotid bulbs extending into the internal carotid arteries without hemodynamically significant stenosis by NASCET criteria. Normal appearance of the included internal carotid arteries.  Left vertebral artery is dominant. Normal appearance of the vertebral arteries, which appear widely patent.  No hemodynamically significant stenosis by NASCET criteria. No dissection, no pseudoaneurysm. No abnormal luminal irregularity. No contrast extravasation.  Thyromegaly with partially calcified multi lobulated heterogeneous left thyroid mass measuring at least 6.6 x 4.8 x 3.8 cm with substernal extent, at displacing the trachea to the right, splaying the trachea and aorta. Multiple sclerotic lesions in the ribs and vertebral bodies concerning for metastatic disease. Status post median sternotomy.  Review of the MIP images confirms the above findings.  IMPRESSION: CTA head: Acute right M2 occlusive thromboembolism, as seen on prior CT.  CTA neck: No hemodynamically significant stenosis.  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  6.6 x 4.8 x 3.8 cm suspicious left thyroid mass displaces the trachea  to the right, recommend follow-up thyroid sonogram on a nonemergent basis.  Preliminary findings discussed by Dr. Eustace Quail, Radiology and reconfirmed by Dr.MCNEILL [facility] on6/28/2015atat approximately 2230 hr.   Electronically Signed   By: Elon Alas   On: 11/15/2013 23:23   Ct Head Wo Contrast  11/16/2013   CLINICAL DATA:  Stroke.  Monitor for edema or hemorrhage  EXAM: CT HEAD WITHOUT CONTRAST  TECHNIQUE: Contiguous axial images were obtained from the base of the skull through the vertex without intravenous contrast.  COMPARISON:  Head CT from the same day at 1:46 a.m.  FINDINGS: Endotracheal and orogastric tubes are  again noted. The mastoids, middle ears, and paranasal sinuses are without new opacification. No new soft tissue findings.  Subarachnoid high attenuation in the lower right sylvian fissure and neighboring sulci persists. There has been some redistribution, with more high-density in the peri-insular region, but no definitive increase. There is no clear parenchymal hematoma. There is evidence of cortical effacement and cytotoxic edema involving the upper surface of the right temporal lobe, compatible with infarct. No upper division infarct visible. There is no hydrocephalus, shift, or contralateral infarct.  These results were called by telephone at the time of interpretation on 11/16/2013 at 11:33 PM to Dr. Leonel Ramsay, who verbally acknowledged these results.  IMPRESSION: 1. Persistent subarachnoid high density around the right sylvian fissure, favoring subarachnoid hemorrhage rather than contrast. There has been redistribution, but no definitive increase. 2. Newly visible infarct in the upper right temporal lobe.   Electronically Signed   By: Jorje Guild M.D.   On: 11/16/2013 23:39   Ct Head Wo Contrast  11/16/2013   CLINICAL DATA:  Status post intervention.  EXAM: CT HEAD WITHOUT CONTRAST  TECHNIQUE: Contiguous axial images were obtained from the base of the skull through the vertex without intravenous contrast.  COMPARISON:  Head CT from yesterday  FINDINGS: There is new nasopharyngeal fluid related to intubation. No acute osseous findings have developed.  There is new serpentine high density in the lower sylvian fissure and neighboring sulci. This appears sulcal rather than cortical. No discrete hematoma. No visible infarct. No hydrocephalus or shift. Generalized brain atrophy and chronic vessel disease is again noted.  These results were called by telephone at the time of interpretation on 11/16/2013 at 2:55 AM to Dr. Leonel Ramsay, who verbally acknowledged these results.  IMPRESSION: 1. Subarachnoid high  attenuation along the right sylvian fissure which is often contrast staining, but cannot exclude subarachnoid hemorrhage. Staining should dissipated on short followup head CT. 2. No visible infarct.   Electronically Signed   By: Jorje Guild M.D.   On: 11/16/2013 02:59   Ct Head (brain) Wo Contrast  11/15/2013   CLINICAL DATA:  Code stroke.  Left-sided facial droop and weakness.  EXAM: CT HEAD WITHOUT CONTRAST  TECHNIQUE: Contiguous axial images were obtained from the base of the skull through the vertex without intravenous contrast.  COMPARISON:  None.  FINDINGS: Skull and Sinuses:Negative for fracture or destructive process. Smoothly contoured opacity in the left sphenoid sinus compatible with mucous retention cyst or less likely polyp.  Orbits: No acute abnormality.  Brain: There is MCA high-density at the level of the M1 M2 junction which given the history is consistent with thrombus. No ischemic changes are seen in the right MCA territory currently. No acute hemorrhage. There is chronic small vessel disease with ischemic gliosis around the lateral ventricles. Generalized brain atrophy. No evidence of mass lesion, hydrocephalus, or shift.  Critical Value/emergent results  were called by telephone at the time of interpretation on 11/15/2013 at 10:07 PM to Dr. Leonel Ramsay, who verbally acknowledged these results.  IMPRESSION: 1. Right MCA thrombus at the M1/M2 junction. No ischemic changes currently visible. 2. No acute intracranial hemorrhage. 3. Brain atrophy and chronic small vessel disease.   Electronically Signed   By: Jorje Guild M.D.   On: 11/15/2013 22:10   Ct Angio Neck W/cm &/or Wo/cm  11/15/2013   CLINICAL DATA:  Code stroke, left facial droop and weakness. History of prostate cancer.  EXAM: CT ANGIOGRAPHY HEAD AND NECK  TECHNIQUE: Multidetector CT imaging of the head and neck was performed using the standard protocol during bolus administration of intravenous contrast. Multiplanar CT image  reconstructions and MIPs were obtained to evaluate the vascular anatomy. Carotid stenosis measurements (when applicable) are obtained utilizing NASCET criteria, using the distal internal carotid diameter as the denominator.  CONTRAST:  87mL OMNIPAQUE IOHEXOL 350 MG/ML SOLN  COMPARISON:  CT of the head November 15, 2013 at 2123 hr.  FINDINGS: CTA HEAD FINDINGS  Anterior circulation: Normal appearance of the cervical internal carotid arteries, petrous, cavernous and supra clinoid internal carotid arteries ; moderate calcific atherosclerosis. At the level of the right middle cerebral artery bifurcation there is complete occlusion of a right M2 branch (likely inferior branch). Due to decreased profusion, there is delayed enhancement of the right cortical veins. Widely patent anterior communicating artery. Normal appearance of the anterior cerebral arteries.  Posterior circulation: Codominant vertebral arteries, with normal appearance of the vertebral arteries, vertebrobasilar junction and basilar artery, as well as main branch vessels. Small bilateral posterior communicating arteries are present. Normal appearance of the posterior cerebral arteries.  No hemodynamically significant stenosis, dissection, luminal irregularity, contrast extravasation or aneurysm within the anterior nor posterior circulation.  Postcontrast imaging of the brain demonstrates no enhancing intracranial lesions. No intraparenchymal hemorrhage.  Review of the MIP images confirms the above findings.  CTA NECK FINDINGS  Normal appearance of the thoracic arch, normal branch pattern. Moderate calcific atherosclerosis. The origins of the innominate, left Common carotid artery and subclavian artery are widely patent. Streak artifact from dental amalgam limits evaluation of the cervical vessels at skull base.  Bilateral Common carotid arteries are widely patent, coursing in a straight line fashion. Medial to that course of the left Common carotid artery,  normal variant and could be transient. 1-2 mm eccentric calcific atherosclerosis of the carotid bulbs extending into the internal carotid arteries without hemodynamically significant stenosis by NASCET criteria. Normal appearance of the included internal carotid arteries.  Left vertebral artery is dominant. Normal appearance of the vertebral arteries, which appear widely patent.  No hemodynamically significant stenosis by NASCET criteria. No dissection, no pseudoaneurysm. No abnormal luminal irregularity. No contrast extravasation.  Thyromegaly with partially calcified multi lobulated heterogeneous left thyroid mass measuring at least 6.6 x 4.8 x 3.8 cm with substernal extent, at displacing the trachea to the right, splaying the trachea and aorta. Multiple sclerotic lesions in the ribs and vertebral bodies concerning for metastatic disease. Status post median sternotomy.  Review of the MIP images confirms the above findings.  IMPRESSION: CTA head: Acute right M2 occlusive thromboembolism, as seen on prior CT.  CTA neck: No hemodynamically significant stenosis.  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  6.6 x 4.8 x 3.8 cm suspicious left thyroid mass displaces the trachea to the right, recommend follow-up thyroid sonogram on a nonemergent basis.  Preliminary findings discussed by Dr. Eustace Quail, Radiology and  reconfirmed by Dr.MCNEILL [facility] on6/28/2015atat approximately 2230 hr.   Electronically Signed   By: Elon Alas   On: 11/15/2013 23:23   Dg Chest Port 1 View  11/17/2013   CLINICAL DATA:  Endotracheal tube  EXAM: PORTABLE CHEST - 1 VIEW  COMPARISON:  11/16/2013  FINDINGS: Mild patchy bibasilar opacities, likely atelectasis. Possible small left pleural effusion. No pneumothorax.  The heart is top-normal in size. Postsurgical changes related to prior CABG. Right subclavian pacemaker.  Endotracheal tube terminates 5 cm above the carina.  Enteric tube courses below the  diaphragm.  IMPRESSION: Endotracheal tube terminates 5 cm above the carina.  Mild patchy bibasilar opacities, likely atelectasis.  Possible small left pleural effusion.   Electronically Signed   By: Julian Hy M.D.   On: 11/17/2013 07:51   Dg Chest Port 1 View  11/16/2013   CLINICAL DATA:  Re-intubation  EXAM: PORTABLE CHEST - 1 VIEW  COMPARISON:  Portable chest x-ray of November 16, 2013 at 5:45 a.m.  FINDINGS: The endotracheal tube tip lies approximately 3 cm above the crotch of the carina. There remains mild bilateral pulmonary hypoinflation greater on the right than on the left. Density at the left lung base is less conspicuous. The cardiac silhouette is mildly enlarged though stable. The left hemidiaphragm is better demonstrated on the current study. The esophagogastric tube has been removed. The observed portions of the permanent pacemaker are normal. 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  IMPRESSION: The endotracheal tube tip is in reasonable position. There is slight improved appearance of the lungs since the earlier study.   Electronically Signed   By: David  [place]   On: 11/16/2013 09:47   Dg Chest Port 1 View  11/16/2013   CLINICAL DATA:  Recent stroke  EXAM: PORTABLE CHEST - 1 VIEW  COMPARISON:  08/19/2012  FINDINGS: An endotracheal tube is noted 6 cm above the carina. Nasogastric catheter is noted coiled within the stomach. A pacing device is noted on the right and stable. Postoperative changes are seen as well. Cardiac shadow is stable. Left basilar atelectatic changes are noted. No focal confluent infiltrate is seen. No bony abnormality is noted.  IMPRESSION: Left basilar atelectasis.  Tubes and lines as described.   Electronically Signed   By: Inez Catalina M.D.   On: 11/16/2013 08:01   Dg Abd Portable 1v  11/16/2013   CLINICAL DATA:  Assess orogastric tube position  EXAM: PORTABLE ABDOMEN - 1 VIEW  COMPARISON:  None.  FINDINGS: The orogastric tube tip  in proximal port lie below the GE junction in the region of the gastric cardia. There is [place] a moderate amount of gas within the stomach. There is contrast within distended renal pelves and calices bilaterally. There is atelectasis or infiltrate at the left lung base.  IMPRESSION: The orogastric tube tip in proximal port lie in the region of the gastric cardia.   Electronically Signed   By: David  [place]   On: 11/16/2013 11:04   PULMONARY A:   Acute respiratory failure due to inability to protect airway Possible aspiration pneumonia Small pleural effusions / atelectasis P:   Goal SpO2>92, pH>7.30 Full mechanical support Daily SBT, but no extubation >>> needs early tracheostomy Ventilator bundle Trend ABG / CXR  CARDIOVASCULAR A:  AF HTN P:  Goal SBP<130 Cardene gtt D/c Labetalol PRN Amiodarone, Lipitor  RENAL A:   AKI Mild metabolic acidosis Prostate CA P:   Trend BMP NS@75  Start free  water 100 q8h Xtandi Proscar  GASTROINTESTINAL A:   Nutrition GI Px P:   NPO as intubated TF Protonix  HEMATOLOGIC A:  Anemia, mild VTE Px P:  Trend CBC SCDs  INFECTIOUS A:   Possible aspiration pneumonia P:   Abx / cx as above  ENDOCRINE  A:   DM Hyperglycemia  P:   SSI Add Lantus 10  NEUROLOGIC A:   Acute R MCA CVA s/p systemic / catheter tPA Acute encephalopathy P:   Propofol gtt Goal RASS 0 to -1 Daily WUA  I have personally obtained history, examined patient, evaluated and interpreted laboratory and imaging results, reviewed medical records, formulated assessment / plan and placed orders.  CRITICAL CARE:  The patient is critically ill with multiple organ systems failure and requires high complexity decision making for assessment and support, frequent evaluation and titration of therapies, application of advanced monitoring technologies and extensive interpretation of multiple databases. Critical Care Time devoted to patient care services described in this  note is 35 minutes.   Doree Fudge, MD Pulmonary and [place] Pager: [phone]  11/17/2013, 8:05 AM

## 2013-11-17 NOTE — Procedures (Signed)
Extubation Procedure Note  Patient Details:   Name: Jeffery Gibson DOB: 05/11/1929 MRN: [id number]   Airway Documentation:     Evaluation  O2 sats: stable throughout and currently acceptable Complications: No apparent complications Patient did tolerate procedure well. Bilateral Breath Sounds: Clear;Diminished Suctioning: Airway Yes  Miquel Dunn 11/17/2013, 4:34 PM

## 2013-11-17 NOTE — Progress Notes (Signed)
ANTIBIOTIC CONSULT NOTE - FOLLOW UP  Pharmacy Consult for unasyn Indication: aspiration PNA  Allergies  Allergen Reactions  . Adhesive [Tape] Rash    Rash and itching. USE PAPER TAPE ONLY  . Latex Hives  . Lovenox [Enoxaparin Sodium]     Gi bleed  . Lupron [Leuprolide Acetate]     Kidney failure  . Warfarin And Related     Gi bleed    Patient Measurements: Height: 5\' 8"  (172.7 cm) Weight: 230 lb 2.6 oz (104.4 kg) IBW/kg (Calculated) : 68.4  Vital Signs: Temp: 97.7 F (36.5 C) (06/30 0800) Temp src: Axillary (06/30 0800) BP: 146/64 mmHg (06/30 0900) Pulse Rate: 87 (06/30 0900) Intake/Output from previous day: 06/29 0701 - 06/30 0700 In: 1747.7 [I.V.:1145.2; NG/GT:602.5] Out: 1506 [Urine:1506] Intake/Output from this shift: Total I/O In: 171 [I.V.:11; NG/GT:160] Out: 300 [Urine:300]  Labs:  Recent Labs  11/15/13 2117 11/15/13 2125 11/16/13 0350 11/17/13 0225  WBC 7.3  --  8.9 12.2*  HGB 11.1* 11.2* 9.6* 8.7*  PLT 175  --  145* 151  CREATININE 2.03* 2.30* 1.77* 2.11*   Estimated Creatinine Clearance: 31.1 ml/min (by C-G formula based on Cr of 2.11). No results found for this basename: VANCOTROUGH, Corlis Leak, VANCORANDOM, [place], GENTPEAK, GENTRANDOM, TOBRATROUGH, TOBRAPEAK, TOBRARND, AMIKACINPEAK, AMIKACINTROU, AMIKACIN,  in the last 72 hours   Microbiology: Recent Results (from the past 720 hour(s))  MRSA PCR SCREENING     Status: None   Collection Time    11/16/13  2:15 AM      Result Value Ref Range Status   MRSA by PCR NEGATIVE  NEGATIVE Final   Comment:            The GeneXpert MRSA Assay (FDA     approved for NASAL specimens     only), is one component of a     comprehensive MRSA colonization     surveillance program. It is not     intended to diagnose MRSA     infection nor to guide or     monitor treatment for     MRSA infections.    Anti-infectives   None      Assessment: 80 yo male here with CVA and VDRF to begn unasyn for  possible aspiration PNA.  WBC= 12.2, tmax= 100.8, SCr= 2.11 and  CrCl ~ 30.  6/30 unasyn>>  6/30 urine 6/30 resp  Plan:  -Unasyn 3gm IV q8h -Will follow renal function, cultures and clinical progress  Hildred Laser, Pharm D 11/17/2013 9:33 AM

## 2013-11-17 NOTE — Plan of Care (Signed)
Problem: Acute Treatment Outcomes Goal: Neuro exam at baseline or improved Outcome: Completed/Met Date Met:  11/17/13 Pt extubated and oriented x4

## 2013-11-17 NOTE — Progress Notes (Signed)
UR completed.  Deshaun Schou, RN BSN M[facility] CCM Trauma/Neuro ICU Case Manager [phone]  

## 2013-11-17 NOTE — Clinical Documentation Improvement (Signed)
  Neurology MD's, NP's, and PA's  "Per CT of Head done 11/16/2013.Marland KitchenMarland KitchenMarland KitchenThere is evidence of cortical effacement and cytotoxic edema involving the upper surface of the right temporal lobe, compatible with infarct......Marland Kitchen  If this is an appropriate diagnosis please document in notes.  Thank you      Abnormal diagnostic findings (MRI scans, CT scans, etc.) are not coded and reported unless the physician indicates their clinical significance. If possible, please help by clarifying the diagnostic and/or clinical significance of the abnormal diagnostic study for this patient.  Cerebral Edema   Cytotoxic Edema   Subfalcine Herniation   Uncal/Transtentorial Herniation   Vasogenic Edema   Other brain herniation (please specify   Other cause (please specify)    Unable to determine   Unknown  Risk Factors: CVA, SAH, Acute Encephalopathy, Acute Respiratory Failure  Sign & Symptoms: unintelligible speech, left sided weakness  Diagnostics:CT of head, Carotid Angiogram  Treatment: TPA IV,    Thank you,  Ree Kida ,RN Clinical Documentation Specialist:  [place] Information Management

## 2013-11-17 NOTE — Progress Notes (Signed)
Stroke Team Progress Note  HISTORY Jeffery Gibson is a 80 y.o. male with a history of multiple stroke risk factors including afib not on anticoagulation due to history of GI bleeding(none recently). He was in his normal state of health until 8:40 pm at which time his daughter went in to the kitchen to fix him a sandwhich. On returning, he was weak on the left side and unintelligible.  He was functional at baseline, attending to his own adls.   Patient was administered IV tPA and then underwent IA tPA and clot retrieval.   He was admitted to the neuro ICU for further evaluation and treatment.  SUBJECTIVE  IV tPA given at 2139 on 6/28, IA and intervention at 2250 on 6/28. No acute events overnight. Re-intubated the morning of 6/29. Repeat head CT evening 6/29.   OBJECTIVE Most recent Vital Signs: Filed Vitals:   11/17/13 0403 11/17/13 0500 11/17/13 0600 11/17/13 0700  BP:  133/57 116/49 152/55  Pulse:  90 85 82  Temp:      TempSrc:      Resp:  21 16 16   Height:      Weight: 104.4 kg (230 lb 2.6 oz)     SpO2:  100% 98% 100%   CBG (last 3)   Recent Labs  11/16/13 1939 11/16/13 2345 11/17/13 0319  GLUCAP 159* 162* 201*    IV Fluid Intake:   . sodium chloride    . niCARDipine Stopped (11/16/13 1430)  . propofol 15 mcg/kg/min (11/17/13 0715)    MEDICATIONS  . amiodarone  100 mg Oral Daily  . antiseptic oral rinse  15 mL Mouth Rinse q12n4p  . atorvastatin  10 mg Oral q1800  . chlorhexidine  15 mL Mouth Rinse BID  . feeding supplement (PRO-STAT SUGAR FREE 64)  60 mL Per Tube TID  . feeding supplement (VITAL HIGH PROTEIN)  1,000 mL Per Tube Q24H  . finasteride  5 mg Oral QPM  . insulin aspart  0-15 Units Subcutaneous 6 times per day  . multivitamin  5 mL Per Tube Daily  . pantoprazole (PROTONIX) IV  40 mg Intravenous QHS  . ursodiol  300 mg Oral BID   PRN:  acetaminophen, acetaminophen, labetalol, ondansetron (ZOFRAN) IV  Diet:  NPO  Activity:  Bedrest DVT Prophylaxis:   SCDs  CLINICALLY SIGNIFICANT STUDIES Basic Metabolic Panel:   Recent Labs Lab 11/16/13 0350 11/17/13 0225  NA 135* 141  K 4.6 4.9  CL 99 107  CO2 19 18*  GLUCOSE 242* 182*  BUN 38* 43*  CREATININE 1.77* 2.11*  CALCIUM 8.3* 8.2*   Liver Function Tests:   Recent Labs Lab 11/15/13 2117  AST 10  ALT <5  ALKPHOS 94  BILITOT 0.3  PROT 6.9  ALBUMIN 3.2*   CBC:  Recent Labs Lab 11/15/13 2117  11/16/13 0350 11/17/13 0225  WBC 7.3  --  8.9 12.2*  NEUTROABS 4.8  --  8.0*  --   HGB 11.1*  < > 9.6* 8.7*  HCT 32.4*  < > 28.2* 26.1*  MCV 99.7  --  98.3 102.0*  PLT 175  --  145* 151  < > = values in this interval not displayed. Coagulation:   Recent Labs Lab 11/15/13 2117  LABPROT 12.9  INR 0.97   Cardiac Enzymes: No results found for this basename: CKTOTAL, CKMB, CKMBINDEX, TROPONINI,  in the last 168 hours Urinalysis: No results found for this basename: COLORURINE, APPERANCEUR, LABSPEC, [place], GLUCOSEU, [place], BILIRUBINUR, [place], [place],  UROBILINOGEN, NITRITE, LEUKOCYTESUR,  in the last 168 hours Lipid Panel    Component Value Date/Time   CHOL 125 11/16/2013 0350   TRIG 232* 11/16/2013 0351   HDL 38* 11/16/2013 0350   CHOLHDL 3.3 11/16/2013 0350   VLDL 45* 11/16/2013 0350   LDLCALC 42 11/16/2013 0350   HgbA1C  Lab Results  Component Value Date   HGBA1C 6.6* 11/16/2013    Urine Drug Screen:   No results found for this basename: labopia,  cocainscrnur,  labbenz,  amphetmu,  thcu,  labbarb    Alcohol Level: No results found for this basename: ETH,  in the last 168 hours  Ct Angio Head W/cm &/or Wo Cm  11/15/2013   CLINICAL DATA:  Code stroke, left facial droop and weakness. History of prostate cancer.  EXAM: CT ANGIOGRAPHY HEAD AND NECK  TECHNIQUE: Multidetector CT imaging of the head and neck was performed using the standard protocol during bolus administration of intravenous contrast. Multiplanar CT image reconstructions and MIPs were obtained to evaluate  the vascular anatomy. Carotid stenosis measurements (when applicable) are obtained utilizing NASCET criteria, using the distal internal carotid diameter as the denominator.  CONTRAST:  54mL OMNIPAQUE IOHEXOL 350 MG/ML SOLN  COMPARISON:  CT of the head November 15, 2013 at 2123 hr.  FINDINGS: CTA HEAD FINDINGS  Anterior circulation: Normal appearance of the cervical internal carotid arteries, petrous, cavernous and supra clinoid internal carotid arteries ; moderate calcific atherosclerosis. At the level of the right middle cerebral artery bifurcation there is complete occlusion of a right M2 branch (likely inferior branch). Due to decreased profusion, there is delayed enhancement of the right cortical veins. Widely patent anterior communicating artery. Normal appearance of the anterior cerebral arteries.  Posterior circulation: Codominant vertebral arteries, with normal appearance of the vertebral arteries, vertebrobasilar junction and basilar artery, as well as main branch vessels. Small bilateral posterior communicating arteries are present. Normal appearance of the posterior cerebral arteries.  No hemodynamically significant stenosis, dissection, luminal irregularity, contrast extravasation or aneurysm within the anterior nor posterior circulation.  Postcontrast imaging of the brain demonstrates no enhancing intracranial lesions. No intraparenchymal hemorrhage.  Review of the MIP images confirms the above findings.  CTA NECK FINDINGS  Normal appearance of the thoracic arch, normal branch pattern. Moderate calcific atherosclerosis. The origins of the innominate, left Common carotid artery and subclavian artery are widely patent. Streak artifact from dental amalgam limits evaluation of the cervical vessels at skull base.  Bilateral Common carotid arteries are widely patent, coursing in a straight line fashion. Medial to that course of the left Common carotid artery, normal variant and could be transient. 1-2 mm  eccentric calcific atherosclerosis of the carotid bulbs extending into the internal carotid arteries without hemodynamically significant stenosis by NASCET criteria. Normal appearance of the included internal carotid arteries.  Left vertebral artery is dominant. Normal appearance of the vertebral arteries, which appear widely patent.  No hemodynamically significant stenosis by NASCET criteria. No dissection, no pseudoaneurysm. No abnormal luminal irregularity. No contrast extravasation.  Thyromegaly with partially calcified multi lobulated heterogeneous left thyroid mass measuring at least 6.6 x 4.8 x 3.8 cm with substernal extent, at displacing the trachea to the right, splaying the trachea and aorta. Multiple sclerotic lesions in the ribs and vertebral bodies concerning for metastatic disease. Status post median sternotomy.  Review of the MIP images confirms the above findings.  IMPRESSION: CTA head: Acute right M2 occlusive thromboembolism, as seen on prior CT.  CTA neck: No hemodynamically significant  stenosis.  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  6.6 x 4.8 x 3.8 cm suspicious left thyroid mass displaces the trachea to the right, recommend follow-up thyroid sonogram on a nonemergent basis.  Preliminary findings discussed by Dr. Eustace Quail, Radiology and reconfirmed by Dr.MCNEILL [facility] on6/28/2015atat approximately 2230 hr.   Electronically Signed   By: Elon Alas   On: 11/15/2013 23:23   Ct Head Wo Contrast  11/16/2013   CLINICAL DATA:  Stroke.  Monitor for edema or hemorrhage  EXAM: CT HEAD WITHOUT CONTRAST  TECHNIQUE: Contiguous axial images were obtained from the base of the skull through the vertex without intravenous contrast.  COMPARISON:  Head CT from the same day at 1:46 a.m.  FINDINGS: Endotracheal and orogastric tubes are again noted. The mastoids, middle ears, and paranasal sinuses are without new opacification. No new soft tissue findings.  Subarachnoid  high attenuation in the lower right sylvian fissure and neighboring sulci persists. There has been some redistribution, with more high-density in the peri-insular region, but no definitive increase. There is no clear parenchymal hematoma. There is evidence of cortical effacement and cytotoxic edema involving the upper surface of the right temporal lobe, compatible with infarct. No upper division infarct visible. There is no hydrocephalus, shift, or contralateral infarct.  These results were called by telephone at the time of interpretation on 11/16/2013 at 11:33 PM to Dr. Leonel Ramsay, who verbally acknowledged these results.  IMPRESSION: 1. Persistent subarachnoid high density around the right sylvian fissure, favoring subarachnoid hemorrhage rather than contrast. There has been redistribution, but no definitive increase. 2. Newly visible infarct in the upper right temporal lobe.   Electronically Signed   By: Jorje Guild M.D.   On: 11/16/2013 23:39   Ct Head Wo Contrast  11/16/2013   CLINICAL DATA:  Status post intervention.  EXAM: CT HEAD WITHOUT CONTRAST  TECHNIQUE: Contiguous axial images were obtained from the base of the skull through the vertex without intravenous contrast.  COMPARISON:  Head CT from yesterday  FINDINGS: There is new nasopharyngeal fluid related to intubation. No acute osseous findings have developed.  There is new serpentine high density in the lower sylvian fissure and neighboring sulci. This appears sulcal rather than cortical. No discrete hematoma. No visible infarct. No hydrocephalus or shift. Generalized brain atrophy and chronic vessel disease is again noted.  These results were called by telephone at the time of interpretation on 11/16/2013 at 2:55 AM to Dr. Leonel Ramsay, who verbally acknowledged these results.  IMPRESSION: 1. Subarachnoid high attenuation along the right sylvian fissure which is often contrast staining, but cannot exclude subarachnoid hemorrhage. Staining should  dissipated on short followup head CT. 2. No visible infarct.   Electronically Signed   By: Jorje Guild M.D.   On: 11/16/2013 02:59   Ct Head (brain) Wo Contrast  11/15/2013   CLINICAL DATA:  Code stroke.  Left-sided facial droop and weakness.  EXAM: CT HEAD WITHOUT CONTRAST  TECHNIQUE: Contiguous axial images were obtained from the base of the skull through the vertex without intravenous contrast.  COMPARISON:  None.  FINDINGS: Skull and Sinuses:Negative for fracture or destructive process. Smoothly contoured opacity in the left sphenoid sinus compatible with mucous retention cyst or less likely polyp.  Orbits: No acute abnormality.  Brain: There is MCA high-density at the level of the M1 M2 junction which given the history is consistent with thrombus. No ischemic changes are seen in the right MCA territory currently. No acute hemorrhage. There is chronic  small vessel disease with ischemic gliosis around the lateral ventricles. Generalized brain atrophy. No evidence of mass lesion, hydrocephalus, or shift.  Critical Value/emergent results were called by telephone at the time of interpretation on 11/15/2013 at 10:07 PM to Dr. Leonel Ramsay, who verbally acknowledged these results.  IMPRESSION: 1. Right MCA thrombus at the M1/M2 junction. No ischemic changes currently visible. 2. No acute intracranial hemorrhage. 3. Brain atrophy and chronic small vessel disease.   Electronically Signed   By: Jorje Guild M.D.   On: 11/15/2013 22:10   Ct Angio Neck W/cm &/or Wo/cm  11/15/2013   CLINICAL DATA:  Code stroke, left facial droop and weakness. History of prostate cancer.  EXAM: CT ANGIOGRAPHY HEAD AND NECK  TECHNIQUE: Multidetector CT imaging of the head and neck was performed using the standard protocol during bolus administration of intravenous contrast. Multiplanar CT image reconstructions and MIPs were obtained to evaluate the vascular anatomy. Carotid stenosis measurements (when applicable) are obtained  utilizing NASCET criteria, using the distal internal carotid diameter as the denominator.  CONTRAST:  59mL OMNIPAQUE IOHEXOL 350 MG/ML SOLN  COMPARISON:  CT of the head November 15, 2013 at 2123 hr.  FINDINGS: CTA HEAD FINDINGS  Anterior circulation: Normal appearance of the cervical internal carotid arteries, petrous, cavernous and supra clinoid internal carotid arteries ; moderate calcific atherosclerosis. At the level of the right middle cerebral artery bifurcation there is complete occlusion of a right M2 branch (likely inferior branch). Due to decreased profusion, there is delayed enhancement of the right cortical veins. Widely patent anterior communicating artery. Normal appearance of the anterior cerebral arteries.  Posterior circulation: Codominant vertebral arteries, with normal appearance of the vertebral arteries, vertebrobasilar junction and basilar artery, as well as main branch vessels. Small bilateral posterior communicating arteries are present. Normal appearance of the posterior cerebral arteries.  No hemodynamically significant stenosis, dissection, luminal irregularity, contrast extravasation or aneurysm within the anterior nor posterior circulation.  Postcontrast imaging of the brain demonstrates no enhancing intracranial lesions. No intraparenchymal hemorrhage.  Review of the MIP images confirms the above findings.  CTA NECK FINDINGS  Normal appearance of the thoracic arch, normal branch pattern. Moderate calcific atherosclerosis. The origins of the innominate, left Common carotid artery and subclavian artery are widely patent. Streak artifact from dental amalgam limits evaluation of the cervical vessels at skull base.  Bilateral Common carotid arteries are widely patent, coursing in a straight line fashion. Medial to that course of the left Common carotid artery, normal variant and could be transient. 1-2 mm eccentric calcific atherosclerosis of the carotid bulbs extending into the internal carotid  arteries without hemodynamically significant stenosis by NASCET criteria. Normal appearance of the included internal carotid arteries.  Left vertebral artery is dominant. Normal appearance of the vertebral arteries, which appear widely patent.  No hemodynamically significant stenosis by NASCET criteria. No dissection, no pseudoaneurysm. No abnormal luminal irregularity. No contrast extravasation.  Thyromegaly with partially calcified multi lobulated heterogeneous left thyroid mass measuring at least 6.6 x 4.8 x 3.8 cm with substernal extent, at displacing the trachea to the right, splaying the trachea and aorta. Multiple sclerotic lesions in the ribs and vertebral bodies concerning for metastatic disease. Status post median sternotomy.  Review of the MIP images confirms the above findings.  IMPRESSION: CTA head: Acute right M2 occlusive thromboembolism, as seen on prior CT.  CTA neck: No hemodynamically significant stenosis.  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  6.6 x 4.8 x 3.8 cm suspicious left  thyroid mass displaces the trachea to the right, recommend follow-up thyroid sonogram on a nonemergent basis.  Preliminary findings discussed by Dr. Eustace Quail, Radiology and reconfirmed by Dr.MCNEILL [facility] on6/28/2015atat approximately 2230 hr.   Electronically Signed   By: Elon Alas   On: 11/15/2013 23:23   Dg Chest Port 1 View  11/16/2013   CLINICAL DATA:  Re-intubation  EXAM: PORTABLE CHEST - 1 VIEW  COMPARISON:  Portable chest x-ray of November 16, 2013 at 5:45 a.m.  FINDINGS: The endotracheal tube tip lies approximately 3 cm above the crotch of the carina. There remains mild bilateral pulmonary hypoinflation greater on the right than on the left. Density at the left lung base is less conspicuous. The cardiac silhouette is mildly enlarged though stable. The left hemidiaphragm is better demonstrated on the current study. The esophagogastric tube has been removed. The observed  portions of the permanent pacemaker are normal. 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  IMPRESSION: The endotracheal tube tip is in reasonable position. There is slight improved appearance of the lungs since the earlier study.   Electronically Signed   By: David  [place]   On: 11/16/2013 09:47   Dg Chest Port 1 View  11/16/2013   CLINICAL DATA:  Recent stroke  EXAM: PORTABLE CHEST - 1 VIEW  COMPARISON:  08/19/2012  FINDINGS: An endotracheal tube is noted 6 cm above the carina. Nasogastric catheter is noted coiled within the stomach. A pacing device is noted on the right and stable. Postoperative changes are seen as well. Cardiac shadow is stable. Left basilar atelectatic changes are noted. No focal confluent infiltrate is seen. No bony abnormality is noted.  IMPRESSION: Left basilar atelectasis.  Tubes and lines as described.   Electronically Signed   By: Inez Catalina M.D.   On: 11/16/2013 08:01   Dg Abd Portable 1v  11/16/2013   CLINICAL DATA:  Assess orogastric tube position  EXAM: PORTABLE ABDOMEN - 1 VIEW  COMPARISON:  None.  FINDINGS: The orogastric tube tip in proximal port lie below the GE junction in the region of the gastric cardia. There is [place] a moderate amount of gas within the stomach. There is contrast within distended renal pelves and calices bilaterally. There is atelectasis or infiltrate at the left lung base.  IMPRESSION: The orogastric tube tip in proximal port lie in the region of the gastric cardia.   Electronically Signed   By: David  [place]   On: 11/16/2013 11:04    CT of the brain   6/29 24hr 1. Persistent subarachnoid high density around the right sylvian  fissure, favoring subarachnoid hemorrhage rather than contrast.  There has been redistribution, but no definitive increase.  6/29 post intervention 1. Subarachnoid high attenuation along the right sylvian fissure  which is often contrast staining, but cannot exclude subarachnoid   hemorrhage. Staining should dissipated on short followup head CT.  2. No visible infarct.  6/28 pre intervention CTA head: Acute right M2 occlusive thromboembolism, as seen on prior  CT.  CTA neck: No hemodynamically significant stenosis.  Osseous metastasis likely related to patient's known prostate  cancer.  6.6 x 4.8 x 3.8 cm suspicious left thyroid mass displaces the  trachea to the right, recommend follow-up thyroid sonogram on a  nonemergent basis.   MRI of the brain    MRA of the brain    2D Echocardiogram    Carotid Doppler    CXR     Therapy Recommendations pending  Physical Exam   General:intubated off propofol gtt CV: irregular  Mental Status:  Eyes open, alert, responds to questions, follows commands  Cranial Nerves:  II: decreased blink to threat on the left Pupils are equal, round, and reactive to light.  V: Facial sensation is deccreased on left  VII: possible left facial droop, difficult to fully tell due to intubation Motor:  Tone is normal. Bulk is normal. Moves both sides against gravity and light resistance Deep Tendon Reflexes:  2+ and symmetric in the biceps and patellae.     ASSESSMENT Jeffery Gibson is a 80 y.o. male presenting with left sided weakness and unintelligible speech. Status post IV t-PA, IA t-PA and clot retrieval. Initial head CT shows acute right M2 clot but no signs of ischemic stroke. Repeat imaging pending (unable to have MRI due to PPM). Infarct felt to be embolic possibly secondary to known A fib.  On ASA 81mg  prior to admission. Now held secondary to administration of t-PA. Patient with resultant left sided weakness. Stroke work up underway.   HTN  LDL  CAD, s/p MI  History of A fib  PPM  DM  Thyroid mass  Metastatic prostate CA  CKD   Hospital day # 2  TREATMENT/PLAN  24hr head CT shows possible small SAH, holding antiplatelet therapy at this time.   Goal extubation today per CCM  Lipitor  10mg   Rehab evaluation  2D echo   Carotid doppler With history of stroke and A fib will need to consider oral anticoagulation. He has a complicated pmhx including metastatic prostate CA,  history of GI bleed which may prohibit the use of anticoagulation. Based on head CT showing SAH will defer decision for now.    This patient is critically ill and at significant risk of neurological worsening, death and care requires constant monitoring of vital signs, hemodynamics,respiratory and cardiac monitoring,review of multiple databases, neurological assessment, discussion with family, other specialists and medical decision making of high complexity. I spent 35 inutes of neurocritical care time in the care of this patient.    Jim Like, DO [facility] Pager: [phone]   To contact Stroke Continuity provider, please refer to [email or web address] After hours, contact General Neurology

## 2013-11-17 NOTE — Progress Notes (Signed)
OT Cancellation Note  Patient Details Name: Jeffery Gibson MRN: [id number] DOB: [birth year]   Cancelled Treatment:    Reason Eval/Treat Not Completed: Patient not medically ready;Other (comment) (bedrest) Please notify when pt appropriate for OT. Thank you. [place], OTR/L  [phone] 11/17/2013 11/17/2013, 10:04 AM

## 2013-11-17 NOTE — Plan of Care (Signed)
Problem: tPA Day Progression Outcomes-Only if tPA administered Goal: Pre tPA panda/NGT inserted if need anticipated Outcome: Completed/Met Date Met:  11/17/13 OG tube placed

## 2013-11-17 NOTE — Progress Notes (Signed)
Received in shift report from night RN that the patient's secretions have gotten thicker and spiked a fever throughout the night. Tylenol was given twice but the fever is not responding to the tylenol. Notified Dr. Al Pimple; chest x-ray ordered. Will continue to monitor.

## 2013-11-17 NOTE — Progress Notes (Signed)
PT Cancellation Note  Patient Details Name: Jeffery Gibson MRN: [id number] DOB: Sep 03, 1928   Cancelled Treatment:    Reason Eval/Treat Not Completed: Patient not medically ready (pt with strict bedrest orders at this time)   Duncan Dull 11/17/2013, 7:49 AM Alben Deeds, PT DPT  [phone]

## 2013-11-18 ENCOUNTER — Telehealth: Payer: Self-pay | Admitting: *Deleted

## 2013-11-18 ENCOUNTER — Inpatient Hospital Stay (HOSPITAL_COMMUNITY): Payer: Medicare Other

## 2013-11-18 DIAGNOSIS — I219 Acute myocardial infarction, unspecified: Secondary | ICD-10-CM

## 2013-11-18 LAB — CBC
HEMATOCRIT: 25.7 % — AB (ref 39.0–52.0)
HEMOGLOBIN: 8.4 g/dL — AB (ref 13.0–17.0)
MCH: 33.6 pg (ref 26.0–34.0)
MCHC: 32.7 g/dL (ref 30.0–36.0)
MCV: 102.8 fL — ABNORMAL HIGH (ref 78.0–100.0)
Platelets: 143 10*3/uL — ABNORMAL LOW (ref 150–400)
RBC: 2.5 MIL/uL — ABNORMAL LOW (ref 4.22–5.81)
RDW: 15.4 % (ref 11.5–15.5)
WBC: 9.5 10*3/uL (ref 4.0–10.5)

## 2013-11-18 LAB — BASIC METABOLIC PANEL
BUN: 48 mg/dL — ABNORMAL HIGH (ref 6–23)
CO2: 18 mEq/L — ABNORMAL LOW (ref 19–32)
CREATININE: 2.11 mg/dL — AB (ref 0.50–1.35)
Calcium: 8.5 mg/dL (ref 8.4–10.5)
Chloride: 107 mEq/L (ref 96–112)
GFR, EST AFRICAN AMERICAN: 32 mL/min — AB (ref >90)
GFR, EST NON AFRICAN AMERICAN: 27 mL/min — AB (ref >90)
GLUCOSE: 117 mg/dL — AB (ref 70–99)
Potassium: 4.3 mEq/L (ref 3.7–5.3)
Sodium: 141 mEq/L (ref 137–147)

## 2013-11-18 LAB — GLUCOSE, CAPILLARY
GLUCOSE-CAPILLARY: 115 mg/dL — AB (ref 70–99)
GLUCOSE-CAPILLARY: 96 mg/dL (ref 70–99)
Glucose-Capillary: 109 mg/dL — ABNORMAL HIGH (ref 70–99)
Glucose-Capillary: 112 mg/dL — ABNORMAL HIGH (ref 70–99)
Glucose-Capillary: 116 mg/dL — ABNORMAL HIGH (ref 70–99)
Glucose-Capillary: 124 mg/dL — ABNORMAL HIGH (ref 70–99)
Glucose-Capillary: 159 mg/dL — ABNORMAL HIGH (ref 70–99)

## 2013-11-18 MED ORDER — ENZALUTAMIDE 40 MG PO CAPS
160.0000 mg | ORAL_CAPSULE | Freq: Every day | ORAL | Status: DC
  Filled 2013-11-18: qty 4

## 2013-11-18 NOTE — Progress Notes (Signed)
Occupational Therapy Evaluation Patient Details Name: Jeffery Gibson MRN: [id number] DOB: [birth year] Today's Date: 11/18/2013    History of Present Illness 80 yo with CAD post CABG, AF ( not on anticoagulation due to GI bleed ), pacemaker, HTN, DM, CKD. Presents with left sided weakness and found to have a right MCA stroke. Intubated for airway protection. Underwent systemic and catheter directed TPA.   Clinical Impression   PTA, pt lived at home and was mod I with mobility and required min A with ADL from family. Pt's wife passed away 08-20-13 and per son, pt is grieving for his loss. Family is able to provide 24/7 S at D/C. At this time, rec home with [facility] when medically stable with 24/7 S of family. Will follow to address below goals and facilitate D/C home with family.     Follow Up Recommendations  Home health OT;Supervision/Assistance - 24 hour    Equipment Recommendations  3 in 1 bedside comode (unsure if pt owns one)    Recommendations for Other Services       Precautions / Restrictions Precautions Precautions: Fall Restrictions Weight Bearing Restrictions: No      Mobility Bed Mobility Overal bed mobility: Needs Assistance Bed Mobility: Supine to Sit     Supine to sit: Mod assist     General bed mobility comments: assist to elevate to upright sitting EOB (Pt states assistance needed at baseline)  Transfers Overall transfer level: Needs assistance Equipment used: 1 person hand held assist Transfers: Sit to/from [facility] Sit to Stand: Min assist Stand pivot transfers: Min assist            Balance Overall balance assessment: Needs assistance Sitting-balance support: Feet supported Sitting balance-Leahy Scale: Good     Standing balance support: During functional activity Standing balance-Leahy Scale: Poor (to fair)                              ADL Overall ADL's : Needs assistance/impaired Eating/Feeding:  Supervision/ safety;Set up   Grooming: Set up;Supervision/safety   Upper Body Bathing: Minimal assitance;Sitting   Lower Body Bathing: Moderate assistance;Sit to/from stand   Upper Body Dressing : Moderate assistance;Sitting   Lower Body Dressing: Moderate assistance;Sit to/from stand   Toilet Transfer: Minimal assistance   Toileting- Clothing Manipulation and Hygiene:  (incontinent at baseline) Toileting - Clothing Manipulation Details (indicate cue type and reason): mod A     Functional mobility during ADLs: Minimal assistance General ADL Comments: Decline from basleine     Vision                     Perception Perception Perception Tested?: Yes (no deficits)   Praxis Praxis Praxis tested?: Within functional limits    Pertinent Vitals/Pain VSS. O2 97-99 RA     Hand Dominance Right   Extremity/Trunk Assessment Upper Extremity Assessment Upper Extremity Assessment: Generalized weakness   Lower Extremity Assessment Lower Extremity Assessment: Generalized weakness   Cervical / Trunk Assessment Cervical / Trunk Assessment: Normal   Communication Communication Communication: Expressive difficulties;HOH   Cognition Arousal/Alertness: Awake/alert Behavior During Therapy: Flat affect Overall Cognitive Status: Impaired/Different from baseline Area of Impairment: Problem solving Son states h has been "slower than  Normal" since the passing of his wife     Memory: Decreased short-term memory (per son)       Problem Solving: Slow processing;Decreased initiation;Requires verbal cues (? if related to cognition vs  [facility])     General Comments       Exercises       Shoulder Instructions      Home Living Family/patient expects to be discharged to:: Private residence Living Arrangements: Children Available Help at Discharge: Family Type of Home: House Home Access: Stairs to enter Technical brewer of Steps: 3   Home Layout: Able to live on main  level with bedroom/bathroom     Bathroom Shower/Tub: Occupational psychologist: Standard Bathroom Accessibility: Yes How Accessible: Accessible via walker Home Equipment: [place] - 2 wheels;Cane - single point;Shower seat;Wheelchair - manual (lift chair)          Prior Functioning/Environment Level of Independence: Independent with assistive device(s);Needs assistance    ADL's / Homemaking Assistance Needed: family assisted with LB ADL and bathing as needed   Comments: used a cane    OT Diagnosis: Generalized weakness   OT Problem List: Decreased strength;Decreased activity tolerance;Decreased safety awareness;Obesity   OT Treatment/Interventions: Self-care/ADL training;DME and/or AE instruction;Therapeutic activities;Patient/family education    OT Goals(Current goals can be found in the care plan section) Acute Rehab OT Goals Patient Stated Goal: to get home OT Goal Formulation: With patient Time For Goal Achievement: 12/02/13 Potential to Achieve Goals: Good  OT Frequency: Min 2X/week   Barriers to D/C:            Co-evaluation              End of Session Equipment Utilized During Treatment: Gait belt Nurse Communication: Mobility status  Activity Tolerance: Patient tolerated treatment well Patient left: in chair;with call bell/phone within reach   Time: 7793-9030 OT Time Calculation (min): 32 min Charges:  OT General Charges $OT Visit: 1 Procedure OT Evaluation $Initial OT Evaluation Tier I: 1 Procedure OT Treatments $Self Care/Home Management : 8-22 mins G-Codes:    Jeffery Gibson,Jeffery Gibson 06-Dec-2013, 2:14 PM   [facility], OTR/L  [phone] 12/06/2013

## 2013-11-18 NOTE — Evaluation (Signed)
Clinical/Bedside Swallow Evaluation Patient Details  Name: Jeffery Gibson MRN: [id number] Date of Birth: 1929-05-13  Today's Date: 11/18/2013 Time: 0910-0940 SLP Time Calculation (min): 30 min  Past Medical History:  Past Medical History  Diagnosis Date  . Ischemic heart disease 05/06/06    post CARG 05/06/06  . Obese     exogenous  . Renal insufficiency   . Anemia of chronic disease     aranesp injections  . Dyslipidemia   . Coronary artery disease   . Anemia associated with chronic renal failure 04/05/2011  . Cancer     prostate/on Lupron  . Adenocarcinoma of colon 11/2003    stage 2(T3,N0,M0)  . Diabetes mellitus   . Hypertension   . Anginal pain   . Myocardial infarction 1986  . Pacemaker   . Cataracts, bilateral   . Seizures   . Arthritis   . Hemorrhoids   . GERD (gastroesophageal reflux disease)     hx of  . Hematoma     hx of  . Atrial flutter     afib and atrial flutter (permanent)  . Tachycardia-bradycardia 1997    dual-chamber/for tachybradycardia syndrome  . Sleep apnea     no cpap   Past Surgical History:  Past Surgical History  Procedure Laterality Date  . Laparotomy  12/04/2003    resection of rectosigmoid carcinoma/  . Radioactive seed implant  05/2005    transperianeal placement I-125 for prostate cancer/# of  seeds 55  . Cardiac catheterization  05/01/06    EF 40%/diffuse 3 vessel CAD/tight L antereior descending artery stenosis/diffuse disease proximal L anterior descending  . Pacemaker removal  1997  . Fracture surgery      left ankle  . Insert / replace / remove pacemaker  2006  . Colon surgery  2005  . Coronary artery bypass graft  05/06/06    x4 with L internal mammary artery to the L anterior descending coronary artery  . Laser surgery  2012    prostate  . Orchiectomy  august 2013  . Cystoscopy w/ ureteral stent placement Bilateral 08/04/2012    Procedure: CYSTOSCOPY WITH bilateral retrograde;  Surgeon: Jeffery Gray, MD;  Location:  [facility];  Service: Urology;  Laterality: Bilateral;  attempted stent placement   . Peripherally inserted central catheter insertion  10-30-12    right upper arm-Antibiotic IV "urinary pseudomonas"  . Cystoscopy w/ ureteral stent placement Bilateral 11/03/2012    Procedure: CYSTOSCOPY WITH STENT REPLACEMENT;  Surgeon: Jeffery Gray, MD;  Location: [facility];  Service: Urology;  Laterality: Bilateral;  . Radiology with anesthesia N/A 11/15/2013    Procedure: RADIOLOGY WITH ANESTHESIA;  Surgeon: Jeffery Hickman, MD;  Location: [facility];  Service: Radiology;  Laterality: N/A;   HPI:  80 y/o male admitted with left sided weakness found to have right MCA CVA s/p IA tpa and clot retrieval 11/16/13. Intubated 6/28 for procedure, failed extubation trial 6/29 and reintubated. Extubated 6/30. PMH of thyroid mass, HTN, CAD s/p MI, metastatic prostate cancer.    Assessment / Plan / Recommendation Clinical Impression  Patient presents with a functional oropharyngeal swallow. Evidence of a very mild dysphagia noted with subtle s/s of decreased airway protection as noted by intermittent throat clearing, primarily with liquids, likely due to mutliple intubations given intact oropharyngeal neuro findings but hoarse vocal quality and inspiratory stridor. Otherwise, patient appearing to protect airway. Cueing provided for small single sips and slow rate of intake with SLP providing mod verbal reminders due to  HOH. Education compete with patient and family regarding mild-moderate aspiraiton risk which should minimize with time off vent. SLP will f/u briefly for tolerance and continued education as needed.     Aspiration Risk  Moderate    Diet Recommendation Regular;Thin liquid   Liquid Administration via: Cup;Straw Medication Administration: Whole meds with liquid Supervision: Patient able to self feed;Intermittent supervision to cue for compensatory strategies Compensations: Slow rate;Small sips/bites Postural Changes  and/or Swallow Maneuvers: Seated upright 90 degrees    Other  Recommendations Oral Care Recommendations: Oral care BID   Follow Up Recommendations  None    Frequency and Duration min 1 x/week  1 week   Pertinent Vitals/Pain n/a        Swallow Study    General HPI: 80 y/o male admitted with left sided weakness found to have right MCA CVA s/p IA tpa and clot retrieval 11/16/13. Intubated 6/28 for procedure, failed extubation trial 6/29 and reintubated. Extubated 6/30. PMH of thyroid mass, HTN, CAD s/p MI, metastatic prostate cancer.  Type of Study: Bedside swallow evaluation Previous Swallow Assessment: none Diet Prior to this Study: NPO Temperature Spikes Noted: No Respiratory Status: Nasal cannula History of Recent Intubation: Yes Length of Intubations (days): 2 days Date extubated: 11/17/13 (two intubations) Behavior/Cognition: Alert;Pleasant mood;Cooperative;Hard of hearing Oral Cavity - Dentition: Adequate natural dentition Self-Feeding Abilities: Able to feed self Patient Positioning: Upright in bed Baseline Vocal Quality: Hoarse Volitional Cough: Weak Volitional Swallow: Able to elicit    Oral/Motor/Sensory Function Overall Oral Motor/Sensory Function: Appears within functional limits for tasks assessed   Ice Chips Ice chips: Not tested   Thin Liquid Thin Liquid: Impaired Presentation: Cup;Self Fed Pharyngeal  Phase Impairments: Suspected delayed Swallow;Throat Clearing - Delayed    Nectar Thick Nectar Thick Liquid: Not tested   Honey Thick Honey Thick Liquid: Not tested   Puree Puree: Within functional limits Presentation: Spoon;Self Fed   Solid   GO   Jeffery Glasscock MA, CCC-SLP [phone]  Solid: Within functional limits Presentation: [place] 11/18/2013,9:52 AM

## 2013-11-18 NOTE — Plan of Care (Signed)
Problem: Progression Outcomes Goal: Communication method established Outcome: Completed/Met Date Met:  11/18/13 Extubated and vocal

## 2013-11-18 NOTE — Progress Notes (Addendum)
Stroke Team Progress Note  HISTORY NYZIER BOIVIN is a 80 y.o. male with a history of multiple stroke risk factors including afib not on anticoagulation due to history of GI bleeding(none recently). He was in his normal state of health until 8:40 pm at which time his daughter went in to the kitchen to fix him a sandwhich. On returning, he was weak on the left side and unintelligible.  He was functional at baseline, attending to his own adls.   Patient was administered IV tPA and then underwent IA tPA and clot retrieval.   He was admitted to the neuro ICU for further evaluation and treatment.  SUBJECTIVE Patient alert and conversant.   OBJECTIVE Most recent Vital Signs: Filed Vitals:   11/18/13 0900 11/18/13 1000 11/18/13 1100 11/18/13 1200  BP: 151/72 140/54 125/66 140/58  Pulse: 89 71 71 81  Temp:      TempSrc:      Resp: 19 20 20 16   Height:      Weight:      SpO2: 97% 98% 98% 99%   CBG (last 3)   Recent Labs  11/18/13 0025 11/18/13 0341 11/18/13 0829  GLUCAP 116* 109* 159*    IV Fluid Intake:      MEDICATIONS  . amiodarone  100 mg Oral Daily  . ampicillin-sulbactam (UNASYN) IV  3 g Intravenous Q8H  . antiseptic oral rinse  15 mL Mouth Rinse q12n4p  . atorvastatin  10 mg Oral q1800  . chlorhexidine  15 mL Mouth Rinse BID  . finasteride  5 mg Oral QPM  . free water  100 mL Per Tube 3 times per day  . insulin aspart  0-15 Units Subcutaneous 6 times per day  . insulin glargine  10 Units Subcutaneous QHS  . multivitamin  5 mL Per Tube Daily  . pantoprazole (PROTONIX) IV  40 mg Intravenous QHS  . ursodiol  300 mg Oral BID   PRN:  acetaminophen, acetaminophen, fentaNYL, labetalol, ondansetron (ZOFRAN) IV  Diet:  Carb Control  Activity:  Bedrest DVT Prophylaxis:  SCDs  CLINICALLY SIGNIFICANT STUDIES Basic Metabolic Panel:   Recent Labs Lab 11/17/13 0225 11/18/13 0301  NA 141 141  K 4.9 4.3  CL 107 107  CO2 18* 18*  GLUCOSE 182* 117*  BUN 43* 48*   CREATININE 2.11* 2.11*  CALCIUM 8.2* 8.5   Liver Function Tests:   Recent Labs Lab 11/15/13 2117  AST 10  ALT <5  ALKPHOS 94  BILITOT 0.3  PROT 6.9  ALBUMIN 3.2*   CBC:  Recent Labs Lab 11/15/13 2117  11/16/13 0350 11/17/13 0225 11/18/13 0301  WBC 7.3  --  8.9 12.2* 9.5  NEUTROABS 4.8  --  8.0*  --   --   HGB 11.1*  < > 9.6* 8.7* 8.4*  HCT 32.4*  < > 28.2* 26.1* 25.7*  MCV 99.7  --  98.3 102.0* 102.8*  PLT 175  --  145* 151 143*  < > = values in this interval not displayed. Coagulation:   Recent Labs Lab 11/15/13 2117  LABPROT 12.9  INR 0.97   Cardiac Enzymes: No results found for this basename: CKTOTAL, CKMB, CKMBINDEX, TROPONINI,  in the last 168 hours Urinalysis: No results found for this basename: COLORURINE, APPERANCEUR, LABSPEC, PHURINE, GLUCOSEU, HGBUR, BILIRUBINUR, KETONESUR, PROTEINUR, UROBILINOGEN, NITRITE, LEUKOCYTESUR,  in the last 168 hours Lipid Panel    Component Value Date/Time   CHOL 125 11/16/2013 0350   TRIG 232* 11/16/2013 0351  HDL 38* 11/16/2013 0350   CHOLHDL 3.3 11/16/2013 0350   VLDL 45* 11/16/2013 0350   LDLCALC 42 11/16/2013 0350   HgbA1C  Lab Results  Component Value Date   HGBA1C 6.6* 11/16/2013    Urine Drug Screen:   No results found for this basename: labopia,  cocainscrnur,  labbenz,  amphetmu,  thcu,  labbarb    Alcohol Level: No results found for this basename: ETH,  in the last 168 hours  Ct Head Wo Contrast  11/16/2013   CLINICAL DATA:  Stroke.  Monitor for edema or hemorrhage  EXAM: CT HEAD WITHOUT CONTRAST  TECHNIQUE: Contiguous axial images were obtained from the base of the skull through the vertex without intravenous contrast.  COMPARISON:  Head CT from the same day at 1:46 a.m.  FINDINGS: Endotracheal and orogastric tubes are again noted. The mastoids, middle ears, and paranasal sinuses are without new opacification. No new soft tissue findings.  Subarachnoid high attenuation in the lower right sylvian fissure and  neighboring sulci persists. There has been some redistribution, with more high-density in the peri-insular region, but no definitive increase. There is no clear parenchymal hematoma. There is evidence of cortical effacement and cytotoxic edema involving the upper surface of the right temporal lobe, compatible with infarct. No upper division infarct visible. There is no hydrocephalus, shift, or contralateral infarct.  These results were called by telephone at the time of interpretation on 11/16/2013 at 11:33 PM to Dr. Leonel Ramsay, who verbally acknowledged these results.  IMPRESSION: 1. Persistent subarachnoid high density around the right sylvian fissure, favoring subarachnoid hemorrhage rather than contrast. There has been redistribution, but no definitive increase. 2. Newly visible infarct in the upper right temporal lobe.   Electronically Signed   By: Jorje Guild M.D.   On: 11/16/2013 23:39   US Soft Tissue Head/neck  11/17/2013   CLINICAL DATA:  Thyroid mass  EXAM: THYROID ULTRASOUND  TECHNIQUE: Ultrasound examination of the thyroid gland and adjacent soft tissues was performed.  COMPARISON:  CT angio neck 11/15/2013  FINDINGS: Right thyroid lobe  Measurements: 4.2 x 2.5 x 2.2 cm. 9 x 5 x 9 mm hypoechoic solid nodule in the interpolar region.  Left thyroid lobe  Measurements: The left lobe was not measured. 3.0 x 1.7 x 3.0 cm hypoechoic solid mass in the lower pole of the left lobe.  Isthmus  Thickness: 3 mm.  No nodules visualized.  Lymphadenopathy  None visualized.  IMPRESSION: Dominant solid left lower pole nodule measuring 3.0 cm. Findings meet consensus criteria for biopsy. Ultrasound-guided fine needle aspiration should be considered, as per the consensus statement: Management of Thyroid Nodules Detected at [place]: Society of Radiologists in [place]. Radiology 2005; [id number].   Electronically Signed   By: Maryclare Bean M.D.   On: 11/17/2013 08:35   Dg Chest Port 1  View  11/17/2013   CLINICAL DATA:  Endotracheal tube  EXAM: PORTABLE CHEST - 1 VIEW  COMPARISON:  11/16/2013  FINDINGS: Mild patchy bibasilar opacities, likely atelectasis. Possible small left pleural effusion. No pneumothorax.  The heart is top-normal in size. Postsurgical changes related to prior CABG. Right subclavian pacemaker.  Endotracheal tube terminates 5 cm above the carina.  Enteric tube courses below the diaphragm.  IMPRESSION: Endotracheal tube terminates 5 cm above the carina.  Mild patchy bibasilar opacities, likely atelectasis.  Possible small left pleural effusion.   Electronically Signed   By: Julian Hy M.D.   On: 11/17/2013 07:51    CT  of the brain   6/29 24hr 1. Persistent subarachnoid high density around the right sylvian  fissure, favoring subarachnoid hemorrhage rather than contrast.  There has been redistribution, but no definitive increase.  6/29 post intervention 1. Subarachnoid high attenuation along the right sylvian fissure  which is often contrast staining, but cannot exclude subarachnoid  hemorrhage. Staining should dissipated on short followup head CT.  2. No visible infarct.  6/28 pre intervention CTA head: Acute right M2 occlusive thromboembolism, as seen on prior  CT.  CTA neck: No hemodynamically significant stenosis.  Osseous metastasis likely related to patient's known prostate  cancer.  6.6 x 4.8 x 3.8 cm suspicious left thyroid mass displaces the  trachea to the right, recommend follow-up thyroid sonogram on a  nonemergent basis.  2D Echocardiogram  Ef60-65% no definite cardiac source of emboli identified  Carotid Doppler  pending  Therapy Recommendations pending  Physical Exam   CV: irregular  Mental Status:  Eyes open, alert, responds to questions, follows commands  Cranial Nerves:  II: decreased blink to threat on the left Pupils are equal, round, and reactive to light.  V: Facial sensation is deccreased on left  VII: possible  left facial droop, difficult to fully tell due to intubation Motor:  Tone is normal. Bulk is normal. Moves both sides against gravity and light resistance Deep Tendon Reflexes:  2+ and symmetric in the biceps and patellae.     ASSESSMENT Mr. KHIAN REMO is a 80 y.o. male presenting with left sided weakness and unintelligible speech. due to right MCA infarct due to right MCA occlusion  secondary to known A fib treated with iv and IA TPA  with good functional recovery but mild subarachnoid blood post procedure.  On ASA 81mg  prior to admission. Plan to restart anticoagulation pending results of CT. Patient with resultant left sided weakness. Stroke work up underway.   HTN  HLD  CAD, s/p MI  History of A fib  PPM  DM  Thyroid mass  Metastatic prostate CA  CKD   Hospital day # 3  TREATMENT/PLAN  Repeat CT tomorrow. Plan to start antiplatelets pending results of CT. May start anticoagulation after 1 week.  Lipitor 10mg   2D echo: no cardiac source identified  Carotid doppler pending  Rehab eval pending  This patient is critically ill and at significant risk of neurological worsening, death and care requires constant monitoring of vital signs, hemodynamics,respiratory and cardiac monitoring,review of multiple databases, neurological assessment, discussion with family, other specialists and medical decision making of high complexity. I spent 35 inutes of neurocritical care time in the care of this patient.  Antony Contras, MD  To contact Stroke Continuity provider, please refer to [email or web address] After hours, contact General Neurology

## 2013-11-18 NOTE — Progress Notes (Signed)
Spoke with Dr. Titus Mould during rounding  about restarting Gillermina Phy and he did not wish to do so at this time.  The medication Gillermina Phy was reordered by pharmacy. Notified Dr. Titus Mould to clarify order. Dr. Titus Mould ordered to discontinue Xtandi. Notified pharmacy. Will continue to monitor.

## 2013-11-18 NOTE — Progress Notes (Signed)
PULMONARY / CRITICAL CARE MEDICINE  Name: Jeffery Gibson MRN: [id number] DOB: 1929/01/11    ADMISSION DATE:  11/15/2013 CONSULTATION DATE:  11/15/2013  REFERRING MD :  Roland Rack, MD PRIMARY SERVICE: Neurology  CHIEF COMPLAINT:  CVA, acute respiratory failure  BRIEF PATIENT DESCRIPTION:  80 yo with CAD post CABG, AF ( not on anticoagulation due to GI bleed ), pacemaker, HTN, DM, CKD. Presents with left sided weakness and found to have a right MCA stroke. Intubated for airway protection. Underwent systemic and catheter directed TPA.   SIGNIFICANT EVENTS / STUDIES:  6/28  Head CT >>> R MCA thrombus at the M1/M2 junction 6/28  Systemic and catheter directed TPA 6/29  Failed exntubation due to inability to protect airway, reintubated 6/29  TTE >>> EF 65%, no RWMA, grade 1 DD 6/30  Extubated, tolerated well   LINES / TUBES: OETT 6/28 >>> 6/29; 6/29 >>> 6/30 OGT 6/29 >>> 6/30 Foley 6/28 >>>  CULTURES: 6/30  Urine >>> GNR 6/30  Respiratory >>> rare gram + cocci, pending  ANTIBIOTICS: Unasyn 6/30 >>>  INTERVAL HISTORY:  No significant overnight events per nurse. Patient was extubated 6/30 and tolerated this well. Currently on 4L [place] and resting.  Daughter was concerned about "wheezing" sounds audible as patient is sleeping, upon waking him up there is no presence of wheezing, this was likely UA and positional.  VITAL SIGNS: Temp:  [98 F (36.7 C)-98.5 F (36.9 C)] 98.1 F (36.7 C) (07/01 0349) Pulse Rate:  [53-132] 80 (07/01 0700) Resp:  [18-25] 21 (07/01 0700) BP: (124-158)/(46-80) 142/58 mmHg (07/01 0700) SpO2:  [98 %-100 %] 99 % (07/01 0700) FiO2 (%):  [40 %] 40 % (06/30 1600) Weight:  [103.8 kg (228 lb 13.4 oz)] 103.8 kg (228 lb 13.4 oz) (07/01 0500)   VENTILATOR SETTINGS: Vent Mode:  [-] PSV;CPAP FiO2 (%):  [40 %] 40 % PEEP:  [5 cmH20] 5 cmH20 Pressure Support:  [5 cmH20] 5 cmH20  INTAKE / OUTPUT: Intake/Output     06/30 0701 - 07/01 0700 07/01 0701 -  07/02 0700   I.V. (mL/kg) 1736 (16.7)    NG/GT 250    IV Piggyback 300    Total Intake(mL/kg) 2286 (22)    Urine (mL/kg/hr) 2296 (0.9)    Total Output 2296     Net -10           PHYSICAL EXAMINATION: General:  Elderly white male, sleeping in bed, NAD. On [place]. Neuro:  Patient is sleeping but wakes easily to voice/touch. Able to maintain alertness. Oriented x 4. Mild generalized weakness, no speech abnormalities, answering questions appropriately. HEENT:  PERRL, oral mucosa pink and dry.  No bleeding or discharge from nares. [place] in place. Cardiovascular:  S1 S2, RRR, no m/g/r. Lungs:  Resps even and unlabored. Lungs mostly CTAB, occasional scattered rhonchi. Loud UA sounds resolving with position change. Abdomen:  Soft, non-tender, non-distended. BS + Musculoskeletal:  Moves all extremities, mild edema noted. Skin:  Warm, dry, intact.  LABS:  CBC  Recent Labs Lab 11/16/13 0350 11/17/13 0225 11/18/13 0301  WBC 8.9 12.2* 9.5  HGB 9.6* 8.7* 8.4*  HCT 28.2* 26.1* 25.7*  PLT 145* 151 143*   Coag's  Recent Labs Lab 11/15/13 2117  APTT 24  INR 0.97   BMET  Recent Labs Lab 11/16/13 0350 11/17/13 0225 11/18/13 0301  NA 135* 141 141  K 4.6 4.9 4.3  CL 99 107 107  CO2 19 18* 18*  BUN 38* 43* 48*  CREATININE 1.77* 2.11* 2.11*  GLUCOSE 242* 182* 117*   Electrolytes  Recent Labs Lab 11/16/13 0350 11/17/13 0225 11/18/13 0301  CALCIUM 8.3* 8.2* 8.5   Sepsis Markers No results found for this basename: LATICACIDVEN, PROCALCITON, O2SATVEN,  in the last 168 hours  ABG No results found for this basename: PHART, PCO2ART, PO2ART,  in the last 168 hours  Liver Enzymes  Recent Labs Lab 11/15/13 2117  AST 10  ALT <5  ALKPHOS 94  BILITOT 0.3  ALBUMIN 3.2*   Cardiac Enzymes No results found for this basename: TROPONINI, PROBNP,  in the last 168 hours Glucose  Recent Labs Lab 11/17/13 0827 11/17/13 1204 11/17/13 1550 11/17/13 1945 11/18/13 0025  11/18/13 0341  GLUCAP 193* 207* 129* 142* 116* 109*   IMAGING:   Ct Head Wo Contrast  11/16/2013   CLINICAL DATA:  Stroke.  Monitor for edema or hemorrhage  EXAM: CT HEAD WITHOUT CONTRAST  TECHNIQUE: Contiguous axial images were obtained from the base of the skull through the vertex without intravenous contrast.  COMPARISON:  Head CT from the same day at 1:46 a.m.  FINDINGS: Endotracheal and orogastric tubes are again noted. The mastoids, middle ears, and paranasal sinuses are without new opacification. No new soft tissue findings.  Subarachnoid high attenuation in the lower right sylvian fissure and neighboring sulci persists. There has been some redistribution, with more high-density in the peri-insular region, but no definitive increase. There is no clear parenchymal hematoma. There is evidence of cortical effacement and cytotoxic edema involving the upper surface of the right temporal lobe, compatible with infarct. No upper division infarct visible. There is no hydrocephalus, shift, or contralateral infarct.  These results were called by telephone at the time of interpretation on 11/16/2013 at 11:33 PM to Dr. Leonel Ramsay, who verbally acknowledged these results.  IMPRESSION: 1. Persistent subarachnoid high density around the right sylvian fissure, favoring subarachnoid hemorrhage rather than contrast. There has been redistribution, but no definitive increase. 2. Newly visible infarct in the upper right temporal lobe.   Electronically Signed   By: Jorje Guild M.D.   On: 11/16/2013 23:39   US Soft Tissue Head/neck  11/17/2013   CLINICAL DATA:  Thyroid mass  EXAM: THYROID ULTRASOUND  TECHNIQUE: Ultrasound examination of the thyroid gland and adjacent soft tissues was performed.  COMPARISON:  CT angio neck 11/15/2013  FINDINGS: Right thyroid lobe  Measurements: 4.2 x 2.5 x 2.2 cm. 9 x 5 x 9 mm hypoechoic solid nodule in the interpolar region.  Left thyroid lobe  Measurements: The left lobe was not  measured. 3.0 x 1.7 x 3.0 cm hypoechoic solid mass in the lower pole of the left lobe.  Isthmus  Thickness: 3 mm.  No nodules visualized.  Lymphadenopathy  None visualized.  IMPRESSION: Dominant solid left lower pole nodule measuring 3.0 cm. Findings meet consensus criteria for biopsy. Ultrasound-guided fine needle aspiration should be considered, as per the consensus statement: Management of Thyroid Nodules Detected at [place]: Society of Radiologists in [place]. Radiology 2005; [id number].   Electronically Signed   By: Maryclare Bean M.D.   On: 11/17/2013 08:35   Dg Chest Port 1 View  11/17/2013   CLINICAL DATA:  Endotracheal tube  EXAM: PORTABLE CHEST - 1 VIEW  COMPARISON:  11/16/2013  FINDINGS: Mild patchy bibasilar opacities, likely atelectasis. Possible small left pleural effusion. No pneumothorax.  The heart is top-normal in size. Postsurgical changes related to prior CABG. Right subclavian pacemaker.  Endotracheal tube terminates 5  cm above the carina.  Enteric tube courses below the diaphragm.  IMPRESSION: Endotracheal tube terminates 5 cm above the carina.  Mild patchy bibasilar opacities, likely atelectasis.  Possible small left pleural effusion.   Electronically Signed   By: Julian Hy M.D.   On: 11/17/2013 07:51   Dg Chest Port 1 View  11/16/2013   CLINICAL DATA:  Re-intubation  EXAM: PORTABLE CHEST - 1 VIEW  COMPARISON:  Portable chest x-ray of November 16, 2013 at 5:45 a.m.  FINDINGS: The endotracheal tube tip lies approximately 3 cm above the crotch of the carina. There remains mild bilateral pulmonary hypoinflation greater on the right than on the left. Density at the left lung base is less conspicuous. The cardiac silhouette is mildly enlarged though stable. The left hemidiaphragm is better demonstrated on the current study. The esophagogastric tube has been removed. The observed portions of the permanent pacemaker are normal. There are 7 intact sternal wires  visible from the patient's previous CABG.  IMPRESSION: The endotracheal tube tip is in reasonable position. There is slight improved appearance of the lungs since the earlier study.   Electronically Signed   By: David  [place]   On: 11/16/2013 09:47   Dg Abd Portable 1v  11/16/2013   CLINICAL DATA:  Assess orogastric tube position  EXAM: PORTABLE ABDOMEN - 1 VIEW  COMPARISON:  None.  FINDINGS: The orogastric tube tip in proximal port lie below the GE junction in the region of the gastric cardia. There is [place] a moderate amount of gas within the stomach. There is contrast within distended renal pelves and calices bilaterally. There is atelectasis or infiltrate at the left lung base.  IMPRESSION: The orogastric tube tip in proximal port lie in the region of the gastric cardia.   Electronically Signed   By: David  [place]   On: 11/16/2013 11:04   PULMONARY A:   Acute respiratory failure due to inability to protect airway Possible aspiration pneumonia Small pleural effusions / atelectasis P:   - Supplemental O2 for SpO2 > 92% - Rpt PCXR 7/01 assess edema, atx -no lasix as of now  CARDIOVASCULAR A:  AF HTN P:  - Goal SBP < 130 - Labetalol PRN - Continue Amiodarone, Lipitor - Consider restart home meds Norvasc in 24 hr, some permissive HTN remains  RENAL A:   AKI Mild metabolic acidosis Prostate CA P:   - Trend BMP - IVF NS 75mL/hr, consider reduction kvo, diet started - D/C free water - Continue Xtandi - Continue Proscar  GASTROINTESTINAL A:   Nutrition GI Px P:   - NPO until SLP eval - SLP eval planned for 7/01 - Hold TF - PPI  HEMATOLOGIC A:  Anemia, mild VTE Px P:  - SCDs  INFECTIOUS A:   Possible aspiration pneumonia, UTI P:   - gram neg noted, unasyn, follow ID and sens in am -avoiding foley  ENDOCRINE  A:   DM Hyperglycemia  P:   - SSI - Lantus 10 units -diet started  NEUROLOGIC A:   Acute R MCA CVA s/p systemic / catheter tPA Acute  encephalopathy P:   - RASS Goal: 0 - Fentanyl PRN for pain -unlikely to safely start hormonal cancer prostate agent in setting recent stroke, can d/w stroke MD I would NOT start this -repeat CT in am planned -PT  Stephanie M. Reese, PA-S 11/18/2013, 8:00 AM  I have fully examined this patient and agree with above findings.     Lavon Paganini. Titus Mould,  MD, FACP Pgr: [facility]

## 2013-11-18 NOTE — Plan of Care (Signed)
Problem: Progression Outcomes Goal: If vent dependent, tolerates weaning Outcome: Completed/Met Date Met:  11/18/13 Pt extubated

## 2013-11-18 NOTE — Telephone Encounter (Signed)
I spoke with her daughter over the telephone. The patient is recovering from stroke. The plan of care is not clear but the patient will soon be discharged from the hospital. I plan to see him as hospital followup on July 16 as scheduled.

## 2013-11-18 NOTE — Plan of Care (Signed)
Problem: tPA Day Progression Outcomes-Only if tPA administered Goal: Post tPA call MD if neuro decline - plan for STAT CT Outcome: Completed/Met Date Met:  11/18/13 No decline

## 2013-11-18 NOTE — Telephone Encounter (Signed)
Daughter left VM wants Dr. Alvy Bimler to know pt was admitted to [facility] Sunday evening for Stroke.  She says they were told one of the scans shows his cancer has spread to his vertebrae.   She wanted Dr. Alvy Bimler to be aware of this.  Daughter can be reached on cell phone [phone] if needed.

## 2013-11-18 NOTE — Progress Notes (Signed)
NUTRITION FOLLOW-UP  DOCUMENTATION CODES Per approved criteria  -Obesity Unspecified   INTERVENTION: Discontinue TF Protocol.  Magic cup TID with meals, each supplement provides 290 kcal and 9 grams of protein  Reviewed protein foods with son.   NUTRITION DIAGNOSIS: Inadequate oral intake related to nausea as evidenced by family report.  Goal: Pt to meet >/= 90% of their estimated nutrition needs, not met.   Monitor:  PO intake, labs, supplement acceptance, weight trend   ASSESSMENT: Pt admitted with R MCA CVA s/p systemic/catheter tPA.   Pt extubated 6/30 and started on a PO diet after a swallow evaluation. Pt with hx of cancer and will follow up with the [facility] after d/c.   Per pt he has lost weight recently. Usual weight is 215 lb. Pt HOH.  Per son pt has had nausea and been eating less PTA. Pt usually ends up eating very small amounts every few hours. Pt dislikes ensure/boost but willing to try magic cups. Per son pt receives injections at [facility] every month which has caused nausea.    Height: Ht Readings from Last 1 Encounters:  11/16/13 $RemoveB'5\' 8"'anYAvjcj$  (1.727 m)    Weight: Wt Readings from Last 1 Encounters:  11/18/13 228 lb 13.4 oz (103.8 kg)  Admission weight 216 lb (98.3 kg)  BMI:  Body mass index is 34.8 kg/(m^2).  Estimated Nutritional Needs: Kcal: 1800-2000 Protein: 90-100 grams Fluid: >1.8 L/day  Skin: MSAD on sacrum   Diet Order: Carb Control   Intake/Output Summary (Last 24 hours) at 11/18/13 1132 Last data filed at 11/18/13 1100  Gross per 24 hour  Intake   2025 ml  Output   1951 ml  Net     74 ml    Last BM: PTA   Labs:   Recent Labs Lab 11/16/13 0350 11/17/13 0225 11/18/13 0301  NA 135* 141 141  K 4.6 4.9 4.3  CL 99 107 107  CO2 19 18* 18*  BUN 38* 43* 48*  CREATININE 1.77* 2.11* 2.11*  CALCIUM 8.3* 8.2* 8.5  GLUCOSE 242* 182* 117*    CBG (last 3)   Recent Labs  11/18/13 0025 11/18/13 0341 11/18/13 0829  GLUCAP 116* 109*  159*    Scheduled Meds: . amiodarone  100 mg Oral Daily  . ampicillin-sulbactam (UNASYN) IV  3 g Intravenous Q8H  . antiseptic oral rinse  15 mL Mouth Rinse q12n4p  . atorvastatin  10 mg Oral q1800  . chlorhexidine  15 mL Mouth Rinse BID  . feeding supplement (PRO-STAT SUGAR FREE 64)  60 mL Per Tube TID  . feeding supplement (VITAL HIGH PROTEIN)  1,000 mL Per Tube Q24H  . finasteride  5 mg Oral QPM  . free water  100 mL Per Tube 3 times per day  . insulin aspart  0-15 Units Subcutaneous 6 times per day  . insulin glargine  10 Units Subcutaneous QHS  . multivitamin  5 mL Per Tube Daily  . pantoprazole (PROTONIX) IV  40 mg Intravenous QHS  . ursodiol  300 mg Oral BID    Continuous Infusions:    Maylon Peppers RD, LDN, CNSC [phone] Pager [phone] After Hours Pager

## 2013-11-18 NOTE — Plan of Care (Signed)
Problem: tPA Day Progression Outcomes-Only if tPA administered Goal: Post tPA image without hemorrhage Outcome: Not Met (add Reason) Subarachnoid blood present

## 2013-11-18 NOTE — Progress Notes (Signed)
3 Days Post-Op  Subjective: CVA R MCA revascularization IAtpa and retrieval extubated Now up in bed; speaking clearly Follows all commands    Objective: Vital signs in last 24 hours: Temp:  [98 F (36.7 C)-98.5 F (36.9 C)] 98.1 F (36.7 C) (07/01 0349) Pulse Rate:  [53-132] 80 (07/01 0700) Resp:  [18-25] 21 (07/01 0700) BP: (124-158)/(46-80) 142/58 mmHg (07/01 0700) SpO2:  [98 %-100 %] 99 % (07/01 0700) FiO2 (%):  [40 %] 40 % (06/30 1600) Weight:  [103.8 kg (228 lb 13.4 oz)] 103.8 kg (228 lb 13.4 oz) (07/01 0500)    Intake/Output from previous day: 06/30 0701 - 07/01 0700 In: 2286 [I.V.:1736; NG/GT:250; IV Piggyback:300] Out: 2296 [Urine:2296] Intake/Output this shift:    PE:  Afeb; vss Extubated Speech slight slow but clear appropriate Sitting up in bed Moves all 4s; good strength B Good grip B Can push/pull feet B = Good sensation B R/L groin: NT no bleeding; no hematoma R/L feet: 2+ pulses   Lab Results:   Recent Labs  11/17/13 0225 11/18/13 0301  WBC 12.2* 9.5  HGB 8.7* 8.4*  HCT 26.1* 25.7*  PLT 151 143*   BMET  Recent Labs  11/17/13 0225 11/18/13 0301  NA 141 141  K 4.9 4.3  CL 107 107  CO2 18* 18*  GLUCOSE 182* 117*  BUN 43* 48*  CREATININE 2.11* 2.11*  CALCIUM 8.2* 8.5   PT/INR  Recent Labs  11/15/13 2117  LABPROT 12.9  INR 0.97   ABG No results found for this basename: PHART, PCO2, PO2, HCO3,  in the last 72 hours  Studies/Results: Ct Head Wo Contrast  11/16/2013   CLINICAL DATA:  Stroke.  Monitor for edema or hemorrhage  EXAM: CT HEAD WITHOUT CONTRAST  TECHNIQUE: Contiguous axial images were obtained from the base of the skull through the vertex without intravenous contrast.  COMPARISON:  Head CT from the same day at 1:46 a.m.  FINDINGS: Endotracheal and orogastric tubes are again noted. The mastoids, middle ears, and paranasal sinuses are without new opacification. No new soft tissue findings.  Subarachnoid high  attenuation in the lower right sylvian fissure and neighboring sulci persists. There has been some redistribution, with more high-density in the peri-insular region, but no definitive increase. There is no clear parenchymal hematoma. There is evidence of cortical effacement and cytotoxic edema involving the upper surface of the right temporal lobe, compatible with infarct. No upper division infarct visible. There is no hydrocephalus, shift, or contralateral infarct.  These results were called by telephone at the time of interpretation on 11/16/2013 at 11:33 PM to Dr. Leonel Ramsay, who verbally acknowledged these results.  IMPRESSION: 1. Persistent subarachnoid high density around the right sylvian fissure, favoring subarachnoid hemorrhage rather than contrast. There has been redistribution, but no definitive increase. 2. Newly visible infarct in the upper right temporal lobe.   Electronically Signed   By: Jorje Guild M.D.   On: 11/16/2013 23:39   US Soft Tissue Head/neck  11/17/2013   CLINICAL DATA:  Thyroid mass  EXAM: THYROID ULTRASOUND  TECHNIQUE: Ultrasound examination of the thyroid gland and adjacent soft tissues was performed.  COMPARISON:  CT angio neck 11/15/2013  FINDINGS: Right thyroid lobe  Measurements: 4.2 x 2.5 x 2.2 cm. 9 x 5 x 9 mm hypoechoic solid nodule in the interpolar region.  Left thyroid lobe  Measurements: The left lobe was not measured. 3.0 x 1.7 x 3.0 cm hypoechoic solid mass in the lower pole of the left  lobe.  Isthmus  Thickness: 3 mm.  No nodules visualized.  Lymphadenopathy  None visualized.  IMPRESSION: Dominant solid left lower pole nodule measuring 3.0 cm. Findings meet consensus criteria for biopsy. Ultrasound-guided fine needle aspiration should be considered, as per the consensus statement: Management of Thyroid Nodules Detected at [place]: Society of Radiologists in [place]. Radiology 2005; [id number].   Electronically Signed   By: Maryclare Bean  M.D.   On: 11/17/2013 08:35   Dg Chest Port 1 View  11/17/2013   CLINICAL DATA:  Endotracheal tube  EXAM: PORTABLE CHEST - 1 VIEW  COMPARISON:  11/16/2013  FINDINGS: Mild patchy bibasilar opacities, likely atelectasis. Possible small left pleural effusion. No pneumothorax.  The heart is top-normal in size. Postsurgical changes related to prior CABG. Right subclavian pacemaker.  Endotracheal tube terminates 5 cm above the carina.  Enteric tube courses below the diaphragm.  IMPRESSION: Endotracheal tube terminates 5 cm above the carina.  Mild patchy bibasilar opacities, likely atelectasis.  Possible small left pleural effusion.   Electronically Signed   By: Julian Hy M.D.   On: 11/17/2013 07:51   Dg Chest Port 1 View  11/16/2013   CLINICAL DATA:  Re-intubation  EXAM: PORTABLE CHEST - 1 VIEW  COMPARISON:  Portable chest x-ray of November 16, 2013 at 5:45 a.m.  FINDINGS: The endotracheal tube tip lies approximately 3 cm above the crotch of the carina. There remains mild bilateral pulmonary hypoinflation greater on the right than on the left. Density at the left lung base is less conspicuous. The cardiac silhouette is mildly enlarged though stable. The left hemidiaphragm is better demonstrated on the current study. The esophagogastric tube has been removed. The observed portions of the permanent pacemaker are normal. 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  IMPRESSION: The endotracheal tube tip is in reasonable position. There is slight improved appearance of the lungs since the earlier study.   Electronically Signed   By: David  [place]   On: 11/16/2013 09:47   Dg Abd Portable 1v  11/16/2013   CLINICAL DATA:  Assess orogastric tube position  EXAM: PORTABLE ABDOMEN - 1 VIEW  COMPARISON:  None.  FINDINGS: The orogastric tube tip in proximal port lie below the GE junction in the region of the gastric cardia. There is [place] a moderate amount of gas within the stomach. There is  contrast within distended renal pelves and calices bilaterally. There is atelectasis or infiltrate at the left lung base.  IMPRESSION: The orogastric tube tip in proximal port lie in the region of the gastric cardia.   Electronically Signed   By: David  [place]   On: 11/16/2013 11:04    Anti-infectives: Anti-infectives   Start     Dose/Rate Route Frequency Ordered Stop   11/17/13 1030  Ampicillin-Sulbactam (UNASYN) 3 g in sodium chloride 0.9 % 100 mL IVPB     3 g 100 mL/hr over 60 Minutes Intravenous Every 8 hours 11/17/13 0934        Assessment/Plan: s/p Procedure(s): RADIOLOGY WITH ANESTHESIA (N/A)  CVA RMCA revasc IAtpa and retrieval 6/29 Doing so well Will report to Dr Estanislado Pandy   LOS: 3 days    Kushal Saunders A 11/18/2013

## 2013-11-18 NOTE — Evaluation (Signed)
Physical Therapy Evaluation Patient Details Name: Jeffery Gibson MRN: [id number] DOB: Jun 05, 1928 Today's Date: 11/18/2013   History of Present Illness  80 yo with CAD post CABG, AF ( not on anticoagulation due to GI bleed ), pacemaker, HTN, DM, CKD. Presents with left sided weakness and found to have a right MCA stroke. Intubated for airway protection. Underwent systemic and catheter directed TPA.  Clinical Impression  Patient demonstrates deficits in functional mobility as indicated below. Will benefit from continued skilled PT to address deficits and maximize function. Will see as indicated and progress as tolerated.     Follow Up Recommendations Home health PT;Supervision/Assistance - 24 hour    Equipment Recommendations  None recommended by PT    Recommendations for Other Services       Precautions / Restrictions Precautions Precautions: Fall Restrictions Weight Bearing Restrictions: No      Mobility  Bed Mobility Overal bed mobility: Needs Assistance Bed Mobility: Supine to Sit     Supine to sit: Mod assist     General bed mobility comments: assist to elevate to upright sitting EOB (Pt states assistance needed at baseline)  Transfers Overall transfer level: Needs assistance Equipment used: 1 person hand held assist Transfers: Sit to/from [facility] Sit to Stand: Min assist Stand pivot transfers: Min assist          Ambulation/Gait Ambulation/Gait assistance: Min assist Ambulation Distance (Feet): 6 Feet Assistive device: 1 person hand held assist Gait Pattern/deviations: Decreased stride length;Shuffle;Trunk flexed   Gait velocity interpretation: Below normal speed for age/gender    Stairs            Wheelchair Mobility    Modified Rankin (Stroke Patients Only) Modified Rankin (Stroke Patients Only) Pre-Morbid Rankin Score: No symptoms Modified Rankin: Moderately severe disability     Balance Overall balance assessment:  Needs assistance Sitting-balance support: Feet supported Sitting balance-Leahy Scale: Good     Standing balance support: During functional activity Standing balance-Leahy Scale: Poor (to fair)                               Pertinent Vitals/Pain No pain, VSS,    Home Living Family/patient expects to be discharged to:: Private residence Living Arrangements: Children Available Help at Discharge: Family Type of Home: House Home Access: Stairs to enter   Technical brewer of Steps: 3 Home Layout: Able to live on main level with bedroom/bathroom Home Equipment: Walker - 2 wheels;Cane - single point;Shower seat;Wheelchair - manual (lift chair)      Prior Function Level of Independence: Independent with assistive device(s);Needs assistance      ADL's / Homemaking Assistance Needed: family assisted with LB ADL and bathing as needed  Comments: used a cane     Hand Dominance   Dominant Hand: Right    Extremity/Trunk Assessment   Upper Extremity Assessment: Generalized weakness           Lower Extremity Assessment: Generalized weakness      Cervical / Trunk Assessment: Normal  Communication   Communication: Expressive difficulties;HOH  Cognition Arousal/Alertness: Awake/alert Behavior During Therapy: Flat affect Overall Cognitive Status: Impaired/Different from baseline Area of Impairment: Problem solving     Memory: Decreased short-term memory (per son)       Problem Solving: Slow processing;Decreased initiation;Requires verbal cues (? if related to cognition vs HOH)      General Comments General comments (skin integrity, edema, etc.): tearing L eye  Exercises        Assessment/Plan    PT Assessment Patient needs continued PT services  PT Diagnosis Difficulty walking;Abnormality of gait;Generalized weakness   PT Problem List Decreased strength;Decreased activity tolerance;Decreased balance;Decreased mobility;Decreased cognition   PT Treatment Interventions DME instruction;Gait training;Stair training;Functional mobility training;Therapeutic activities;Therapeutic exercise;Balance training;Patient/family education   PT Goals (Current goals can be found in the Care Plan section) Acute Rehab PT Goals Patient Stated Goal: to get home PT Goal Formulation: With patient/family Time For Goal Achievement: 12/02/13 Potential to Achieve Goals: Good    Frequency Min 4X/week   Barriers to discharge        Co-evaluation               End of Session Equipment Utilized During Treatment: Gait belt Activity Tolerance: Patient tolerated treatment well Patient left: in chair;with call bell/phone within reach;with family/visitor present Nurse Communication: Mobility status         Time: 0272-5366 PT Time Calculation (min): 32 min   Charges:   PT Evaluation $Initial PT Evaluation Tier I: 1 Procedure PT Treatments $Therapeutic Activity: 8-22 mins   PT G CodesDuncan Dull 11/18/2013, 3:30 PM Alben Deeds, [place] DPT  [phone]

## 2013-11-19 ENCOUNTER — Inpatient Hospital Stay (HOSPITAL_COMMUNITY): Payer: Medicare Other

## 2013-11-19 ENCOUNTER — Encounter (HOSPITAL_COMMUNITY): Payer: Self-pay | Admitting: Radiology

## 2013-11-19 DIAGNOSIS — N390 Urinary tract infection, site not specified: Secondary | ICD-10-CM

## 2013-11-19 DIAGNOSIS — G9340 Encephalopathy, unspecified: Secondary | ICD-10-CM

## 2013-11-19 DIAGNOSIS — I10 Essential (primary) hypertension: Secondary | ICD-10-CM

## 2013-11-19 LAB — BASIC METABOLIC PANEL
Anion gap: 15 (ref 5–15)
BUN: 45 mg/dL — AB (ref 6–23)
CHLORIDE: 106 meq/L (ref 96–112)
CO2: 20 mEq/L (ref 19–32)
CREATININE: 2.18 mg/dL — AB (ref 0.50–1.35)
Calcium: 8.9 mg/dL (ref 8.4–10.5)
GFR calc Af Amer: 30 mL/min — ABNORMAL LOW (ref >90)
GFR calc non Af Amer: 26 mL/min — ABNORMAL LOW (ref >90)
Glucose, Bld: 114 mg/dL — ABNORMAL HIGH (ref 70–99)
Potassium: 4.2 mEq/L (ref 3.7–5.3)
Sodium: 141 mEq/L (ref 137–147)

## 2013-11-19 LAB — GLUCOSE, CAPILLARY
GLUCOSE-CAPILLARY: 134 mg/dL — AB (ref 70–99)
GLUCOSE-CAPILLARY: 127 mg/dL — AB (ref 70–99)
Glucose-Capillary: 120 mg/dL — ABNORMAL HIGH (ref 70–99)
Glucose-Capillary: 113 mg/dL — ABNORMAL HIGH (ref 70–99)

## 2013-11-19 LAB — URINE CULTURE: Colony Count: >=100000

## 2013-11-19 LAB — PRO B NATRIURETIC PEPTIDE: Pro B Natriuretic peptide (BNP): 16600 pg/mL — ABNORMAL HIGH (ref 0–450)

## 2013-11-19 MED ORDER — PANTOPRAZOLE SODIUM 40 MG PO TBEC: 40.0000 mg | DELAYED_RELEASE_TABLET | Freq: Every day | ORAL | Status: DC

## 2013-11-19 MED ORDER — MENTHOL 3 MG MT LOZG
1.0000 | LOZENGE | OROMUCOSAL | Status: DC | PRN
  Filled 2013-11-19: qty 9

## 2013-11-19 MED ORDER — ENZALUTAMIDE 40 MG PO CAPS: 160.0000 mg | ORAL_CAPSULE | Freq: Every morning | ORAL | Status: AC

## 2013-11-19 MED ORDER — AMOXICILLIN-POT CLAVULANATE 500-125 MG PO TABS: 1.0000 | ORAL_TABLET | Freq: Two times a day (BID) | ORAL | Status: DC

## 2013-11-19 MED ORDER — WARFARIN SODIUM 5 MG PO TABS: 5.0000 mg | ORAL_TABLET | Freq: Every day | ORAL | Status: DC

## 2013-11-19 NOTE — Progress Notes (Signed)
Stroke Team Progress Note  HISTORY Jeffery Gibson is a 80 y.o. male with a history of multiple stroke risk factors including afib not on anticoagulation due to history of GI bleeding(none recently). He was in his normal state of health until 8:40 pm on 11/15/2013 at which time his daughter went in to the kitchen to fix him a sandwhich. On returning, he was weak on the left side and unintelligible. He was functional at baseline, attending to his own ADLS. Patient was administered IV tPA and then underwent IA tPA and clot retrieval. Complete revascularization of occuded Rt MCA using 13 mg of IA TPA and 1 pass with TRevoprovue and one pass with 8mm x 20 mm Solitaire FR device was achieved.    He was admitted to the neuro ICU for further evaluation and treatment.  SUBJECTIVE Son at bedside. Discussed treatment plan and discharge plan with Dr. Leonie Man for this afternoon.  OBJECTIVE Most recent Vital Signs: Filed Vitals:   11/19/13 0600 11/19/13 0700 11/19/13 0800 11/19/13 0900  BP: 139/72 122/83 157/67 157/78  Pulse: 82 78 98 91  Temp:      TempSrc:      Resp: 25 20 15 23   Height:      Weight:      SpO2: 90% 93% 95% 96%   CBG (last 3)   Recent Labs  11/18/13 2336 11/19/13 0315 11/19/13 0812  GLUCAP 96 120* 134*    IV Fluid Intake:      MEDICATIONS  . amiodarone  100 mg Oral Daily  . ampicillin-sulbactam (UNASYN) IV  3 g Intravenous Q8H  . antiseptic oral rinse  15 mL Mouth Rinse q12n4p  . atorvastatin  10 mg Oral q1800  . chlorhexidine  15 mL Mouth Rinse BID  . finasteride  5 mg Oral QPM  . insulin aspart  0-15 Units Subcutaneous 6 times per day  . insulin glargine  10 Units Subcutaneous QHS  . multivitamin  5 mL Per Tube Daily  . pantoprazole (PROTONIX) IV  40 mg Intravenous QHS  . ursodiol  300 mg Oral BID   PRN:  acetaminophen, acetaminophen, labetalol, menthol-cetylpyridinium, ondansetron (ZOFRAN) IV  Diet:  Carb Control thin liquids Activity:  OOB DVT Prophylaxis:   SCDs  CLINICALLY SIGNIFICANT STUDIES Basic Metabolic Panel:   Recent Labs Lab 11/18/13 0301 11/19/13 0234  NA 141 141  K 4.3 4.2  CL 107 106  CO2 18* 20  GLUCOSE 117* 114*  BUN 48* 45*  CREATININE 2.11* 2.18*  CALCIUM 8.5 8.9   Liver Function Tests:   Recent Labs Lab 11/15/13 2117  AST 10  ALT <5  ALKPHOS 94  BILITOT 0.3  PROT 6.9  ALBUMIN 3.2*   CBC:  Recent Labs Lab 11/15/13 2117  11/16/13 0350 11/17/13 0225 11/18/13 0301  WBC 7.3  --  8.9 12.2* 9.5  NEUTROABS 4.8  --  8.0*  --   --   HGB 11.1*  < > 9.6* 8.7* 8.4*  HCT 32.4*  < > 28.2* 26.1* 25.7*  MCV 99.7  --  98.3 102.0* 102.8*  PLT 175  --  145* 151 143*  < > = values in this interval not displayed. Coagulation:   Recent Labs Lab 11/15/13 2117  LABPROT 12.9  INR 0.97   Cardiac Enzymes: No results found for this basename: CKTOTAL, CKMB, CKMBINDEX, TROPONINI,  in the last 168 hours Urinalysis: No results found for this basename: COLORURINE, APPERANCEUR, LABSPEC, PHURINE, GLUCOSEU, HGBUR, BILIRUBINUR, KETONESUR, PROTEINUR, UROBILINOGEN, NITRITE, LEUKOCYTESUR,  in the last 168 hours Lipid Panel    Component Value Date/Time   CHOL 125 11/16/2013 0350   TRIG 232* 11/16/2013 0351   HDL 38* 11/16/2013 0350   CHOLHDL 3.3 11/16/2013 0350   VLDL 45* 11/16/2013 0350   LDLCALC 42 11/16/2013 0350   HgbA1C  Lab Results  Component Value Date   HGBA1C 6.6* 11/16/2013    Urine Drug Screen:   No results found for this basename: labopia,  cocainscrnur,  labbenz,  amphetmu,  thcu,  labbarb    Alcohol Level: No results found for this basename: ETH,  in the last 168 hours  CT Head  11/19/2013   1. Expected increased edema in the right MCA infarct, affecting the temporal lobe. There is new, mild petechial hemorrhage. 2. Clearing and re-distributing subarachnoid hemorrhage 11/16/2013    1. Persistent subarachnoid high density around the right sylvian fissure, favoring subarachnoid hemorrhage rather than contrast. There  has been redistribution, but no definitive increase. 2. Newly visible infarct in the upper right temporal lobe.    11/16/2013    1. Subarachnoid high attenuation along the right sylvian fissure which is often contrast staining, but cannot exclude subarachnoid hemorrhage. Staining should dissipated on short followup head CT. 2. No visible infarct.    11/15/2013    1. Right MCA thrombus at the M1/M2 junction. No ischemic changes currently visible. 2. No acute intracranial hemorrhage. 3. Brain atrophy and chronic small vessel disease.     Cerebral Angio 11/17/2013    Status post endovascular complete revascularization of occluded dominant inferior division of the right middle cerebral artery using approximately 13 mg of superselective intracranial tPA followed by one pass with the Trevo ProVue 16mm x 20 mm retrieval device, and one pass with the 41mm x 20 mm Solitaire FR retrieval device.     CT Angio Head & Neck 11/15/2013     CTA head: Acute right M2 occlusive thromboembolism, as seen on prior CT.  CTA neck: No hemodynamically significant stenosis.  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  6.6 x 4.8 x 3.8 cm suspicious left thyroid mass displaces the trachea to the right, recommend follow-up thyroid sonogram on a nonemergent basis.    2D Echocardiogram  Ef60-65% no definite cardiac source of emboli identified  Chest XRAY 11/18/2013    Endotracheal tube removed.  Improvement in bibasilar atelectasis.  Possible 6 mm left upper lobe nodule.   11/17/2013    Endotracheal tube terminates 5 cm above the carina.  Mild patchy bibasilar opacities, likely atelectasis.  Possible small left pleural effusion.    11/16/2013   The endotracheal tube tip is in reasonable position. There is slight improved appearance of the lungs since the earlier study.    11/16/2013    Left basilar atelectasis.  Tubes and lines as described.     US Soft Tissue Head/neck  11/17/2013    Dominant solid left lower pole  nodule measuring 3.0 cm. Findings meet consensus criteria for biopsy. Ultrasound-guided fine needle aspiration should be considered  Therapy Recommendations HH PT, OT  Physical Exam   CV: irregular  Mental Status:  Eyes open, alert, responds to questions, follows commands  Cranial Nerves:  II: decreased blink to threat on the left Pupils are equal, round, and reactive to light.  V: Facial sensation is deccreased on left  VII: possible left facial droop, difficult to fully tell due to intubation Motor:  Tone is normal. Bulk is normal. Moves both sides against gravity and  light resistance Deep Tendon Reflexes:  2+ and symmetric in the biceps and patellae.    ASSESSMENT [name] is a 80 y.o. male presenting with left sided weakness and unintelligible speech. Imaging confirms a right MCA infarct due to right MCA occlusion  secondary to known A fib treated with IV and neuro intervention. Complete revascularization with IA TPA and mechanical thrombectomy with Trevo and Solitaire. Patient with good functional recovery but mild subarachnoid blood post procedure.  On ASA 81mg  prior to admission. Now on no antithrombotics. Patient with resultant left hemiparesis, acute encephalopathy/delirium which is resolving. Stroke work up completed.  Acute respiratory failure secondary to inability to protect airway,resolved Presumed aspiration pneumonia, on Unasyn Small pleural effusions/atelectasis atrial fibrillation, not anticoagulated candidate at the time secondary to subarachnoid hemorrhage hypertension Hyperlipidemia, LDL 42, on Crestor 10 mg PTA, now on Crestor 10 mg, at goal LDL < 70 for diabetics Diabetes, HgbA1c 6.6, goal < 7.0  CAD, s/p MI  PPM  Thyroid mass  Metastatic prostate CA  CKD with acute care the injury. Mild metabolic acidosis resolved.  UTI, Proteus mirabilis, on Unasyn  Discussed anticoagulation history with son. Patient had been on warfarin in the past. He  was being transitioned off coumadin with Lovenox for upcoming surgery when he developed GI bleeding. He never had issues on warfarin itself.   xtandi not restarted yesterday  Hospital day # 4  TREATMENT/PLAN  Discharge home  Case manager consult for [facility] PT, OT  Okay to resume xtandi from stroke standpoint. Son will touch base with his oncologist prior to resuming   Resume aspirin 81 mg daily at time of discharge. Resume warfarin one week following onset of stroke. Will treat with aspirin 81 mg to INR is therapeutic.  Augmentin 500 mg/125 twice a day for 9 more doses (total 7 days treatment)  F/u Dr. Alvy Bimler 12/03/2013  SIGNED Burnetta Sabin, MSN, RN, ANVP-BC, ANP-BC, GNP-BC [facility] Pager: [phone] 11/19/2013 9:29 AM   I have personally examined this patient, taken history, reviewed pertinent lab results and imaging studies, developed plan of care and agree with above.  Antony Contras, MD  To contact Stroke Continuity provider, please refer to [email or web address] After hours, contact General Neurology

## 2013-11-19 NOTE — Progress Notes (Signed)
PULMONARY / CRITICAL CARE MEDICINE  Name: Jeffery Gibson MRN: [id number] DOB: 07-Dec-1928    ADMISSION DATE:  11/15/2013 CONSULTATION DATE:  11/15/2013  REFERRING MD :  Roland Rack, MD PRIMARY SERVICE: Neurology  CHIEF COMPLAINT:  CVA, acute respiratory failure  BRIEF PATIENT DESCRIPTION:  80 yo with CAD post CABG, AF ( not on anticoagulation due to GI bleed ), pacemaker, HTN, DM, CKD. Presents with left sided weakness and found to have a right MCA stroke. Intubated for airway protection. Underwent systemic and catheter directed TPA.   SIGNIFICANT EVENTS / STUDIES:  6/28  Head CT >>> R MCA thrombus at the M1/M2 junction 6/28  Systemic and catheter directed TPA 6/29  Failed exntubation due to inability to protect airway, reintubated 6/29  TTE >>> EF 65%, no RWMA, grade 1 DD 6/30  Extubated, tolerated well   LINES / TUBES: OETT 6/28 >>> 6/29; 6/29 >>> 6/30 OGT 6/29 >>> 6/30 Foley 6/28 >>>  CULTURES: 6/30  Urine >>> PROTEUS MIRABILIS  6/30  Respiratory >>>   ANTIBIOTICS: Unasyn 6/30 >>>  INTERVAL HISTORY:  Remains intubated.  Complains of mouth being sore from ETT.  VITAL SIGNS: Temp:  [97.9 F (36.6 C)-98.8 F (37.1 C)] 98 F (36.7 C) (07/02 0316) Pulse Rate:  [64-146] 78 (07/02 0700) Resp:  [13-25] 20 (07/02 0700) BP: (114-157)/(53-83) 122/83 mmHg (07/02 0700) SpO2:  [90 %-100 %] 93 % (07/02 0700) Weight:  [105.6 kg (232 lb 12.9 oz)] 105.6 kg (232 lb 12.9 oz) (07/02 0320)    VENTILATOR SETTINGS:   INTAKE / OUTPUT: Intake/Output     07/01 0701 - 07/02 0700 07/02 0701 - 07/03 0700   I.V. (mL/kg) 225 (2.1)    NG/GT     IV Piggyback 300    Total Intake(mL/kg) 525 (5)    Urine (mL/kg/hr) 1160 (0.5)    Total Output 1160     Net -635          Urine Occurrence 1 x     PHYSICAL EXAMINATION: General:  No distress Neuro:  Awake, alert,  Somewhat slow to respond  HEENT:  PERRL, no overt mouth lesions Cardiovascular:  Regular, no murmurs Lungs:  Bilateral  air entry, no added sounds Abdomen:  Soft, non-tender Musculoskeletal:  Moves all extremities, mild edema Skin:  Intact   LABS:  CBC  Recent Labs Lab 11/16/13 0350 11/17/13 0225 11/18/13 0301  WBC 8.9 12.2* 9.5  HGB 9.6* 8.7* 8.4*  HCT 28.2* 26.1* 25.7*  PLT 145* 151 143*   Coag's  Recent Labs Lab 11/15/13 2117  APTT 24  INR 0.97   BMET  Recent Labs Lab 11/17/13 0225 11/18/13 0301 11/19/13 0234  NA 141 141 141  K 4.9 4.3 4.2  CL 107 107 106  CO2 18* 18* 20  BUN 43* 48* 45*  CREATININE 2.11* 2.11* 2.18*  GLUCOSE 182* 117* 114*   Electrolytes  Recent Labs Lab 11/17/13 0225 11/18/13 0301 11/19/13 0234  CALCIUM 8.2* 8.5 8.9   Sepsis Markers No results found for this basename: LATICACIDVEN, PROCALCITON, O2SATVEN,  in the last 168 hours  ABG No results found for this basename: PHART, PCO2ART, PO2ART,  in the last 168 hours  Liver Enzymes  Recent Labs Lab 11/15/13 2117  AST 10  ALT <5  ALKPHOS 94  BILITOT 0.3  ALBUMIN 3.2*   Cardiac Enzymes  Recent Labs Lab 11/19/13 0234  PROBNP 16600.0*   Glucose  Recent Labs Lab 11/18/13 0829 11/18/13 1216 11/18/13 1558 11/18/13 1951 11/18/13  2336 11/19/13 0315  GLUCAP 159* 115* 124* 112* 96 120*   IMAGING:   Ct Head Wo Contrast  11/19/2013   CLINICAL DATA:  Followup stroke.  EXAM: CT HEAD WITHOUT CONTRAST  TECHNIQUE: Contiguous axial images were obtained from the base of the skull through the vertex without intravenous contrast.  COMPARISON:  11/16/2013  FINDINGS: No new opacification of the mastoids, middle ears, or paranasal sinuses. No new bony findings. The orbits are negative for age.  An infarct in the superficial and anterior right temporal lobe is more clearly visible, but likely not increased compared to the most recent examination. Faint linear high density on the surface of the infarct is likely new petechial hemorrhage. Subarachnoid hemorrhage in the sylvian fissure and neighboring sulci  is decreasing. A small volume of intraventricular blood within the occipital horn right lateral ventricle is likely re-distributed blood. There is no evidence of new infarct. There is minimal local mass effect from the cytotoxic edema. Generalized brain atrophy and chronic small vessel disease is again noted.  IMPRESSION: 1. Expected increased edema in the right MCA infarct, affecting the temporal lobe. There is new, mild petechial hemorrhage. 2. Clearing and re-distributing subarachnoid hemorrhage, as above.   Electronically Signed   By: Jorje Guild M.D.   On: 11/19/2013 06:36   US Soft Tissue Head/neck  11/17/2013   CLINICAL DATA:  Thyroid mass  EXAM: THYROID ULTRASOUND  TECHNIQUE: Ultrasound examination of the thyroid gland and adjacent soft tissues was performed.  COMPARISON:  CT angio neck 11/15/2013  FINDINGS: Right thyroid lobe  Measurements: 4.2 x 2.5 x 2.2 cm. 9 x 5 x 9 mm hypoechoic solid nodule in the interpolar region.  Left thyroid lobe  Measurements: The left lobe was not measured. 3.0 x 1.7 x 3.0 cm hypoechoic solid mass in the lower pole of the left lobe.  Isthmus  Thickness: 3 mm.  No nodules visualized.  Lymphadenopathy  None visualized.  IMPRESSION: Dominant solid left lower pole nodule measuring 3.0 cm. Findings meet consensus criteria for biopsy. Ultrasound-guided fine needle aspiration should be considered, as per the consensus statement: Management of Thyroid Nodules Detected at [place]: Society of Radiologists in [place]. Radiology 2005; [id number].   Electronically Signed   By: Maryclare Bean M.D.   On: 11/17/2013 08:35   Dg Chest Port 1 View  11/18/2013   CLINICAL DATA:  Assess edema  EXAM: PORTABLE CHEST - 1 VIEW  COMPARISON:  11/17/2013  FINDINGS: Endotracheal tube removed. NG tube removed. Improvement in bibasilar atelectasis. Negative for edema or effusion.  Possible 6 mm left upper lobe nodule.  No other lung nodules.  Cardiac enlargement with changes  of CABG and dual lead pacemaker. Negative for heart failure  IMPRESSION: Endotracheal tube removed.  Improvement in bibasilar atelectasis.  Possible 6 mm left upper lobe nodule.  Followup recommended.   Electronically Signed   By: Franchot Gallo M.D.   On: 11/18/2013 12:45   PULMONARY A:   Acute respiratory failure due to inability to protect airway, resolved Possible aspiration pneumonia Small pleural effusions / atelectasis P:   Supplemental O2 for SpO2 > 92% Incentive spirometry / flutter valve   CARDIOVASCULAR A:  AF HTN P:  Goal SBP < 130 Labetalol PRN Amiodarone, Lipitor Restart Norvasc?  RENAL A:   AKI Mild metabolic acidosis, resolved Prostate CA P:   Trend BMP Xtandi ? - not sure if CVA is contraindication Proscar as preadmission  GASTROINTESTINAL A:   Nutrition PPI preadmission  P:   Protonix Carb modified diet  HEMATOLOGIC A:  Anemia, mild VTE Px P:  SCDs  INFECTIOUS A:   UTI with PROTEUS MIRABILIS  Possible aspiration pneumonia P:   Unasyn x 7 days  ENDOCRINE  A:   DM Hyperglycemia  P:   SSI Lantus 10 units  NEUROLOGIC A:   Acute R MCA CVA s/p systemic / catheter tPA Acute encephalopathy / delirium, resolving P:   Cepacol for mouth discomfort  D/c Fentanyl  Seems to be stable enough to transfer from ICU.  PCCM will sign off.  Please re consult if necessary.  I have personally obtained history, examined patient, evaluated and interpreted laboratory and imaging results, reviewed medical records, formulated assessment / plan and placed orders.  Doree Fudge, MD Pulmonary and [place] Pager: [phone]  11/19/2013, 7:56 AM

## 2013-11-19 NOTE — Discharge Summary (Signed)
Stroke Discharge Summary  Patient ID: Jeffery Gibson   MRN: [id number]      DOB: [birth year] (80)  Date of Admission: 11/15/2013 Date of Discharge: 11/19/2013  Attending Physician:  Suzzanne Cloud, MD, Stroke MD  Consulting Physician(s):      Kenton Kingfisher, CCM Patient's PCP:  Jeffery Hatchet, MD  Discharge Diagnoses:   right MCA infarct due to right MCA occlusion secondary to known A fib treated with IV tPA and neuro intervention.   mild subarachnoid hemorrhage, secondary to neuro intervention atrial fibrillation hypertension  Hyperlipidemia Diabetes CAD, s/p MI  PPM  Acute respiratory failure secondary to inability to protect airway,resolved  Small pleural effusions/atelectasis  Presumed aspiration pneumonia Thyroid mass  Metastatic prostate CA CKD with acute care the injury. Mild metabolic acidosis resolved.  UTI, Proteus mirabilis obesity, BMI: Body mass index is 35.41 kg/(m^2).  Past Medical History  Diagnosis Date  . Ischemic heart disease 05/06/06    post CARG 05/06/06  . Obese     exogenous  . Renal insufficiency   . Anemia of chronic disease     aranesp injections  . Dyslipidemia   . Coronary artery disease   . Anemia associated with chronic renal failure 04/05/2011  . Cancer     prostate/on Lupron  . Adenocarcinoma of colon 11/2003    stage 2(T3,N0,M0)  . Diabetes mellitus   . Hypertension   . Anginal pain   . Myocardial infarction 1986  . Pacemaker   . Cataracts, bilateral   . Seizures   . Arthritis   . Hemorrhoids   . GERD (gastroesophageal reflux disease)     hx of  . Hematoma     hx of  . Atrial flutter     afib and atrial flutter (permanent)  . Tachycardia-bradycardia 1997    dual-chamber/for tachybradycardia syndrome  . Sleep apnea     no cpap   Past Surgical History  Procedure Laterality Date  . Laparotomy  12/04/2003    resection of rectosigmoid carcinoma/  . Radioactive seed implant  05/2005    transperianeal placement I-125  for prostate cancer/# of  seeds 55  . Cardiac catheterization  05/01/06    EF 40%/diffuse 3 vessel CAD/tight L antereior descending artery stenosis/diffuse disease proximal L anterior descending  . Pacemaker removal  1997  . Fracture surgery      left ankle  . Insert / replace / remove pacemaker  2006  . Colon surgery  2005  . Coronary artery bypass graft  05/06/06    x4 with L internal mammary artery to the L anterior descending coronary artery  . Laser surgery  2012    prostate  . Orchiectomy  august 2013  . Cystoscopy w/ ureteral stent placement Bilateral 08/04/2012    Procedure: CYSTOSCOPY WITH bilateral retrograde;  Surgeon: Dutch Gray, MD;  Location: [facility];  Service: Urology;  Laterality: Bilateral;  attempted stent placement   . Peripherally inserted central catheter insertion  10-30-12    right upper arm-Antibiotic IV "urinary pseudomonas"  . Cystoscopy w/ ureteral stent placement Bilateral 11/03/2012    Procedure: CYSTOSCOPY WITH STENT REPLACEMENT;  Surgeon: Dutch Gray, MD;  Location: [facility];  Service: Urology;  Laterality: Bilateral;  . Radiology with anesthesia N/A 11/15/2013    Procedure: RADIOLOGY WITH ANESTHESIA;  Surgeon: Rob Hickman, MD;  Location: [facility];  Service: Radiology;  Laterality: N/A;      Medication List         acetaminophen 500  MG tablet  Commonly known as:  TYLENOL  Take 500 mg by mouth every 6 (six) hours as needed for pain or fever.     amiodarone 200 MG tablet  Commonly known as:  PACERONE  Take 100 mg by mouth daily.     amLODipine 5 MG tablet  Commonly known as:  NORVASC  Take 5 mg by mouth every morning.     amoxicillin-clavulanate 500-125 MG per tablet  Commonly known as:  AUGMENTIN  Take 1 tablet (500 mg total) by mouth 2 (two) times daily.     ARANESP IJ  Inject 1 Syringe as directed See admin instructions. Every 4 weeks as needed for anemia if hemoglobin is below 11     aspirin EC 81 MG tablet  Take 81 mg by mouth every  evening.     CALCIUM 600 PO  Take 1,200 mg by mouth daily with lunch.     enzalutamide 40 MG capsule  Commonly known as:  XTANDI  Take 4 capsules (160 mg total) by mouth every morning.     finasteride 5 MG tablet  Commonly known as:  PROSCAR  Take 5 mg by mouth every evening.     furosemide 40 MG tablet  Commonly known as:  LASIX  Take 20 mg by mouth as directed.     insulin glargine 100 UNIT/ML injection  Commonly known as:  LANTUS  10 units daily or as directed     metoprolol 50 MG tablet  Commonly known as:  LOPRESSOR  Take 50 mg by mouth 2 (two) times daily.     MYRBETRIQ 50 MG Tb24 tablet  Generic drug:  mirabegron ER  Take 50 mg by mouth at bedtime.     omeprazole 20 MG capsule  Commonly known as:  PRILOSEC  Take 20 mg by mouth at bedtime.     rosuvastatin 10 MG tablet  Commonly known as:  CRESTOR  Take 5 mg by mouth every evening.     ursodiol 300 MG capsule  Commonly known as:  ACTIGALL  Take 300 mg by mouth 2 (two) times daily.     VICKS VAPOR INHALER IN  Inhale 1 puff into the lungs daily as needed (for dry air).     vitamin C 500 MG tablet  Commonly known as:  ASCORBIC ACID  Take 500 mg by mouth daily.     Vitamin D 2000 UNITS tablet  Take 2,000 Units by mouth daily at 12 noon.     warfarin 5 MG tablet  Commonly known as:  COUMADIN  Take 1 tablet (5 mg total) by mouth daily.  Start taking on:  11/23/2013        LABORATORY STUDIES CBC    Component Value Date/Time   WBC 9.5 11/18/2013 0301   WBC 8.2 11/05/2013 1107   WBC 10.2 11/05/2011 0853   WBC 7.5 03/02/2010 1247   RBC 2.50* 11/18/2013 0301   RBC 3.08* 11/05/2013 1107   RBC 2.65* 11/12/2011 0410   RBC 3.91* 11/05/2011 0853   RBC 3.71* 03/02/2010 1247   HGB 8.4* 11/18/2013 0301   HGB 10.2* 11/05/2013 1107   HGB 11.1* 11/05/2011 0853   HGB 11.7* 03/02/2010 1247   HCT 25.7* 11/18/2013 0301   HCT 30.0* 11/05/2013 1107   HCT 35.6* 11/05/2011 0853   HCT 33.5* 03/02/2010 1247   PLT 143* 11/18/2013 0301    PLT 166 11/05/2013 1107   PLT 163 03/02/2010 1247   MCV 102.8* 11/18/2013 0301  MCV 97.4 11/05/2013 1107   MCV 91.0 11/05/2011 0853   MCV 90 03/02/2010 1247   MCH 33.6 11/18/2013 0301   MCH 33.1 11/05/2013 1107   MCH 28.4 11/05/2011 0853   MCH 31.4 03/02/2010 1247   MCHC 32.7 11/18/2013 0301   MCHC 34.0 11/05/2013 1107   MCHC 31.2* 11/05/2011 0853   MCHC 34.8 03/02/2010 1247   RDW 15.4 11/18/2013 0301   RDW 13.4 11/05/2013 1107   RDW 13.8 03/02/2010 1247   LYMPHSABS 0.5* 11/16/2013 0350   LYMPHSABS 0.8* 11/05/2013 1107   LYMPHSABS 0.9 03/02/2010 1247   MONOABS 0.4 11/16/2013 0350   MONOABS 0.8 11/05/2013 1107   EOSABS 0.0 11/16/2013 0350   EOSABS 0.3 11/05/2013 1107   EOSABS 0.2 03/02/2010 1247   BASOSABS 0.0 11/16/2013 0350   BASOSABS 0.0 11/05/2013 1107   BASOSABS 0.0 03/02/2010 1247   CMP    Component Value Date/Time   NA 141 11/19/2013 0234   NA 137 10/08/2013 1056   NA 141 02/09/2010 1156   K 4.2 11/19/2013 0234   K 4.7 10/08/2013 1056   K 4.5 02/09/2010 1156   CL 106 11/19/2013 0234   CL 104 07/10/2012 1115   CL 98 02/09/2010 1156   CO2 20 11/19/2013 0234   CO2 21* 10/08/2013 1056   CO2 25 02/09/2010 1156   GLUCOSE 114* 11/19/2013 0234   GLUCOSE 166* 10/08/2013 1056   GLUCOSE 247* 07/10/2012 1115   GLUCOSE 141* 02/09/2010 1156   BUN 45* 11/19/2013 0234   BUN 43.8* 10/08/2013 1056   BUN 36* 02/09/2010 1156   CREATININE 2.18* 11/19/2013 0234   CREATININE 2.2* 10/08/2013 1056   CREATININE 2.88* 12/28/2011 1546   CALCIUM 8.9 11/19/2013 0234   CALCIUM 9.8 10/08/2013 1056   CALCIUM 8.6 02/09/2010 1156   PROT 6.9 11/15/2013 2117   PROT 6.9 10/08/2013 1056   PROT 7.0 02/09/2010 1156   ALBUMIN 3.2* 11/15/2013 2117   ALBUMIN 3.3* 10/08/2013 1056   AST 10 11/15/2013 2117   AST 10 10/08/2013 1056   AST 25 02/09/2010 1156   ALT <5 11/15/2013 2117   ALT <6 10/08/2013 1056   ALT 24 02/09/2010 1156   ALKPHOS 94 11/15/2013 2117   ALKPHOS 88 10/08/2013 1056   ALKPHOS 77 02/09/2010 1156   BILITOT 0.3 11/15/2013 2117   BILITOT  0.45 10/08/2013 1056   BILITOT 0.70 02/09/2010 1156   GFRNONAA 26* 11/19/2013 0234   GFRAA 30* 11/19/2013 0234   COAGS Lab Results  Component Value Date   INR 0.97 11/15/2013   INR 1.13 08/07/2012   INR 1.31 11/15/2011   Lipid Panel    Component Value Date/Time   CHOL 125 11/16/2013 0350   TRIG 232* 11/16/2013 0351   HDL 38* 11/16/2013 0350   CHOLHDL 3.3 11/16/2013 0350   VLDL 45* 11/16/2013 0350   LDLCALC 42 11/16/2013 0350   HgbA1C  Lab Results  Component Value Date   HGBA1C 6.6* 11/16/2013   Cardiac Panel (last 3 results) No results found for this basename: CKTOTAL, CKMB, TROPONINI, RELINDX,  in the last 72 hours Urinalysis    Component Value Date/Time   COLORURINE YELLOW 08/19/2012 1640   APPEARANCEUR TURBID* 08/19/2012 1640   LABSPEC 1.018 08/19/2012 1640   LABSPEC 1.010 12/29/2009 1137   PHURINE 5.5 08/19/2012 1640   GLUCOSEU NEGATIVE 08/19/2012 1640   HGBUR LARGE* 08/19/2012 1640   BILIRUBINUR NEGATIVE 08/19/2012 1640   KETONESUR NEGATIVE 08/19/2012 1640   PROTEINUR 100* 08/19/2012 1640   UROBILINOGEN  0.2 08/19/2012 1640   NITRITE POSITIVE* 08/19/2012 1640   LEUKOCYTESUR LARGE* 08/19/2012 1640   Urine Drug Screen  No results found for this basename: labopia,  cocainscrnur,  labbenz,  amphetmu,  thcu,  labbarb    Alcohol Level    Component Value Date/Time   [facility] <11 11/07/2011 0915     SIGNIFICANT DIAGNOSTIC STUDIES CT Head  11/19/2013 1. Expected increased edema in the right MCA infarct, affecting the temporal lobe. There is new, mild petechial hemorrhage. 2. Clearing and re-distributing subarachnoid hemorrhage  11/16/2013 1. Persistent subarachnoid high density around the right sylvian fissure, favoring subarachnoid hemorrhage rather than contrast. There has been redistribution, but no definitive increase. 2. Newly visible infarct in the upper right temporal lobe.  11/16/2013 1. Subarachnoid high attenuation along the right sylvian fissure which is often contrast staining, but cannot exclude  subarachnoid hemorrhage. Staining should dissipated on short followup head CT. 2. No visible infarct.  11/15/2013 1. Right MCA thrombus at the M1/M2 junction. No ischemic changes currently visible. 2. No acute intracranial hemorrhage. 3. Brain atrophy and chronic small vessel disease.  Cerebral Angio 11/17/2013 Status post endovascular complete revascularization of occluded dominant inferior division of the right middle cerebral artery using approximately 13 mg of superselective intracranial tPA followed by one pass with the Trevo ProVue 20mm x 20 mm retrieval device, and one pass with the 53mm x 20 mm Solitaire FR retrieval device.  CT Angio Head & Neck 11/15/2013  CTA head: Acute right M2 occlusive thromboembolism, as seen on prior CT. CTA neck: No hemodynamically significant stenosis. 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 6.6 x 4.8 x 3.8 cm suspicious left thyroid mass displaces the trachea to the right, recommend follow-up thyroid sonogram on a nonemergent basis.  2D Echocardiogram Ef60-65% no definite cardiac source of emboli identified  Chest XRAY  11/18/2013 Endotracheal tube removed. Improvement in bibasilar atelectasis. Possible 6 mm left upper lobe nodule.  11/17/2013 Endotracheal tube terminates 5 cm above the carina. Mild patchy bibasilar opacities, likely atelectasis. Possible small left pleural effusion.  11/16/2013 The endotracheal tube tip is in reasonable position. There is slight improved appearance of the lungs since the earlier study.  11/16/2013 Left basilar atelectasis. Tubes and lines as described.  US Soft Tissue Head/neck 11/17/2013 Dominant solid left lower pole nodule measuring 3.0 cm. Findings meet consensus criteria for biopsy. Ultrasound-guided fine needle aspiration should be considered     History of Present Illness   Jeffery Gibson is a 80 y.o. male with a history of multiple stroke risk factors including afib not on anticoagulation due to history  of GI bleeding(none recently). He was in his normal state of health until 8:40 pm on 11/15/2013 at which time his daughter went in to the kitchen to fix him a sandwhich. On returning, he was weak on the left side and unintelligible. He was functional at baseline, attending to his own ADLS. Patient was administered IV tPA and then underwent IA tPA and clot retrieval. Complete revascularization of occuded Rt MCA using 13 mg of IA TPA and 1 pass with TRevoprovue and one pass with 39mm x 20 mm Solitaire FR device was achieved. He was admitted to the neuro ICU for further evaluation and treatment.   Hospital Course Patient tolerated tPA without complication. Imaging at 24 hours shows no hemorrhage. MRI confirmed ischemic infarct in the right MCA infarct due to right MCA occlusion secondary to known A fib treated with IV and neuro intervention. Complete revascularization  with IA TPA and mechanical thrombectomy with Trevo and Solitaire. Patient with good functional recovery but mild subarachnoid blood post procedure. On ASA 81mg  prior to admission. At time of discharge, patient was started back on Aspirin 81 mg daily. 1 week post stroke (7/6/20515) patient will be restarted on coumadin (7.5 mg for the first day followed by 5 mg daily. INR check recommended for Thurs, 11/26/2013).   Patient with vascular risk factors of:  atrial fibrillation, not anticoagulated candidate at the time secondary to subarachnoid hemorrhage  hypertension  Hyperlipidemia, LDL 42, on Crestor 10 mg PTA, now on Crestor 10 mg, at goal LDL < 70 for diabetics  Diabetes, HgbA1c 6.6, goal < 7.0 CAD, s/p MI  PPM  Obesity, Body mass index is 35.41 kg/(m^2).   Patient also with: Acute respiratory failure secondary to inability to protect airway,resolved  Small pleural effusions/atelectasis  Presumed aspiration pneumonia, on Unasyn, Changed to augmentin 500/125 mg bid x 9 more doses to complete a 7-day course Thyroid mass  Metastatic prostate  CA, xtandi restarted at time of discharge if ok with oncologist. Son to call and confirm CKD with acute care the injury. Mild metabolic acidosis resolved.  UTI, Proteus mirabilis, on Unasyn. Changed to augmentin 500/125 mg bid x 9 more doses to complete a 7-day course  Patient with resultant left sided weakness. Physical therapy, occupational therapy and speech therapy evaluated patient. They recommend home health PT and OT, which were arranged.  Discharge Exam  Blood pressure 152/70, pulse 70, temperature 97.5 F (36.4 C), temperature source Oral, resp. rate 14, height 5\' 8"  (1.727 m), weight 105.6 kg (232 lb 12.9 oz), SpO2 97.00%.  CV: irregular  Mental Status:  Eyes open, alert, responds to questions, follows commands  Cranial Nerves:  II: decreased blink to threat on the left Pupils are equal, round, and reactive to light.  V: Facial sensation is deccreased on left  VII: possible left facial droop, difficult to fully tell due to intubation  Motor:  Tone is normal. Bulk is normal. Moves both sides against gravity and light resistance  Deep Tendon Reflexes:  2+ and symmetric in the biceps and patellae.    Discharge Diet   Carb Control thin liquids  Discharge Plan    Disposition:  Home with daughter   aspirin 81 mg orally every day for secondary stroke prevention.  Ongoing risk factor control by Primary Care Physician.  HH PT, OT   Follow-up HOLWERDA, SCOTT, MD in 2 weeks.  Follow-up with Dr. Erlinda Hong, [facility] in 2 months.  F/u Dr. Alvy Bimler 12/03/2013  45 minutes were spent preparing discharge.  Signed Burnetta Sabin, MSN, RN, ANVP-BC, ANP-BC, GNP-BC [facility] Pager: [phone] 11/20/2013 5:13 PM  I have personally examined this patient, reviewed pertinent data and developed the plan of care. I agree with above. Antony Contras, MD

## 2013-11-19 NOTE — Progress Notes (Signed)
4 Days Post-Op  Subjective: CVA R MCA revasc - IA tpa and clot retrieval 6/29 Doing well Alert; up in bed Only complaint is "teeth hurt"  Objective: Vital signs in last 24 hours: Temp:  [97.9 F (36.6 C)-98.8 F (37.1 C)] 98 F (36.7 C) (07/02 0316) Pulse Rate:  [64-146] 78 (07/02 0700) Resp:  [13-25] 20 (07/02 0700) BP: (114-157)/(53-83) 122/83 mmHg (07/02 0700) SpO2:  [90 %-100 %] 93 % (07/02 0700) Weight:  [105.6 kg (232 lb 12.9 oz)] 105.6 kg (232 lb 12.9 oz) (07/02 0320)    Intake/Output from previous day: 07/01 0701 - 07/02 0700 In: 525 [I.V.:225; IV Piggyback:300] Out: 1160 [Urine:1160] Intake/Output this shift:    PE:  Afeb; vss  appropriate Up in bed A/O; face symmetrical Tongue midline Follows all commands Labs wnl Moves all 4s = Good strength; good sensation feet 2+ pulses B groins: No bleeding; no hematoma NT to palpate  Lab Results:   Recent Labs  11/17/13 0225 11/18/13 0301  WBC 12.2* 9.5  HGB 8.7* 8.4*  HCT 26.1* 25.7*  PLT 151 143*   BMET  Recent Labs  11/18/13 0301 11/19/13 0234  NA 141 141  K 4.3 4.2  CL 107 106  CO2 18* 20  GLUCOSE 117* 114*  BUN 48* 45*  CREATININE 2.11* 2.18*  CALCIUM 8.5 8.9   PT/INR No results found for this basename: LABPROT, INR,  in the last 72 hours ABG No results found for this basename: PHART, PCO2, PO2, HCO3,  in the last 72 hours  Studies/Results: Ct Head Wo Contrast  11/19/2013   CLINICAL DATA:  Followup stroke.  EXAM: CT HEAD WITHOUT CONTRAST  TECHNIQUE: Contiguous axial images were obtained from the base of the skull through the vertex without intravenous contrast.  COMPARISON:  11/16/2013  FINDINGS: No new opacification of the mastoids, middle ears, or paranasal sinuses. No new bony findings. The orbits are negative for age.  An infarct in the superficial and anterior right temporal lobe is more clearly visible, but likely not increased compared to the most recent examination. Faint linear high  density on the surface of the infarct is likely new petechial hemorrhage. Subarachnoid hemorrhage in the sylvian fissure and neighboring sulci is decreasing. A small volume of intraventricular blood within the occipital horn right lateral ventricle is likely re-distributed blood. There is no evidence of new infarct. There is minimal local mass effect from the cytotoxic edema. Generalized brain atrophy and chronic small vessel disease is again noted.  IMPRESSION: 1. Expected increased edema in the right MCA infarct, affecting the temporal lobe. There is new, mild petechial hemorrhage. 2. Clearing and re-distributing subarachnoid hemorrhage, as above.   Electronically Signed   By: Jorje Guild M.D.   On: 11/19/2013 06:36   US Soft Tissue Head/neck  11/17/2013   CLINICAL DATA:  Thyroid mass  EXAM: THYROID ULTRASOUND  TECHNIQUE: Ultrasound examination of the thyroid gland and adjacent soft tissues was performed.  COMPARISON:  CT angio neck 11/15/2013  FINDINGS: Right thyroid lobe  Measurements: 4.2 x 2.5 x 2.2 cm. 9 x 5 x 9 mm hypoechoic solid nodule in the interpolar region.  Left thyroid lobe  Measurements: The left lobe was not measured. 3.0 x 1.7 x 3.0 cm hypoechoic solid mass in the lower pole of the left lobe.  Isthmus  Thickness: 3 mm.  No nodules visualized.  Lymphadenopathy  None visualized.  IMPRESSION: Dominant solid left lower pole nodule measuring 3.0 cm. Findings meet consensus criteria for biopsy.  Ultrasound-guided fine needle aspiration should be considered, as per the consensus statement: Management of Thyroid Nodules Detected at [place]: Society of Radiologists in [place]. Radiology 2005; [id number].   Electronically Signed   By: Maryclare Bean M.D.   On: 11/17/2013 08:35   Dg Chest Port 1 View  11/18/2013   CLINICAL DATA:  Assess edema  EXAM: PORTABLE CHEST - 1 VIEW  COMPARISON:  11/17/2013  FINDINGS: Endotracheal tube removed. NG tube removed. Improvement in bibasilar  atelectasis. Negative for edema or effusion.  Possible 6 mm left upper lobe nodule.  No other lung nodules.  Cardiac enlargement with changes of CABG and dual lead pacemaker. Negative for heart failure  IMPRESSION: Endotracheal tube removed.  Improvement in bibasilar atelectasis.  Possible 6 mm left upper lobe nodule.  Followup recommended.   Electronically Signed   By: Franchot Gallo M.D.   On: 11/18/2013 12:45   Dg Chest Port 1 View  11/17/2013   CLINICAL DATA:  Endotracheal tube  EXAM: PORTABLE CHEST - 1 VIEW  COMPARISON:  11/16/2013  FINDINGS: Mild patchy bibasilar opacities, likely atelectasis. Possible small left pleural effusion. No pneumothorax.  The heart is top-normal in size. Postsurgical changes related to prior CABG. Right subclavian pacemaker.  Endotracheal tube terminates 5 cm above the carina.  Enteric tube courses below the diaphragm.  IMPRESSION: Endotracheal tube terminates 5 cm above the carina.  Mild patchy bibasilar opacities, likely atelectasis.  Possible small left pleural effusion.   Electronically Signed   By: Julian Hy M.D.   On: 11/17/2013 07:51    Anti-infectives: Anti-infectives   Start     Dose/Rate Route Frequency Ordered Stop   11/17/13 1030  Ampicillin-Sulbactam (UNASYN) 3 g in sodium chloride 0.9 % 100 mL IVPB     3 g 100 mL/hr over 60 Minutes Intravenous Every 8 hours 11/17/13 0934        Assessment/Plan: s/p Procedure(s): RADIOLOGY WITH ANESTHESIA (N/A)  CVA; R MCA recanalization IA tpa and retrieval 6/29 Doing well followed by CCM and stroke team   LOS: 4 days    Jeffery Gibson A 11/19/2013

## 2013-11-19 NOTE — Progress Notes (Signed)
ANTIBIOTIC CONSULT NOTE - FOLLOW UP  Pharmacy Consult for unasyn Indication: aspiration PNA  Allergies  Allergen Reactions  . Adhesive [Tape] Rash    Rash and itching. USE PAPER TAPE ONLY  . Latex Hives  . Lovenox [Enoxaparin Sodium]     Gi bleed  . Lupron [Leuprolide Acetate]     Kidney failure  . Warfarin And Related     Gi bleed    Patient Measurements: Height: 5\' 8"  (172.7 cm) Weight: 232 lb 12.9 oz (105.6 kg) IBW/kg (Calculated) : 68.4  Vital Signs: Temp: 98 F (36.7 C) (07/02 0316) Temp src: Oral (07/02 0316) BP: 131/63 mmHg (07/02 1100) Pulse Rate: 81 (07/02 1100) Intake/Output from previous day: 07/01 0701 - 07/02 0700 In: 535 [I.V.:225; IV Piggyback:310] Out: 1285 [Urine:1285] Intake/Output from this shift: Total I/O In: 100 [IV Piggyback:100] Out: 350 [Urine:350]  Labs:  Recent Labs  11/17/13 0225 11/18/13 0301 11/19/13 0234  WBC 12.2* 9.5  --   HGB 8.7* 8.4*  --   PLT 151 143*  --   CREATININE 2.11* 2.11* 2.18*   Estimated Creatinine Clearance: 30.3 ml/min (by C-G formula based on Cr of 2.18). No results found for this basename: VANCOTROUGH, Corlis Leak, VANCORANDOM, [place], GENTPEAK, GENTRANDOM, TOBRATROUGH, TOBRAPEAK, TOBRARND, AMIKACINPEAK, AMIKACINTROU, AMIKACIN,  in the last 72 hours   Microbiology: Recent Results (from the past 720 hour(s))  MRSA PCR SCREENING     Status: None   Collection Time    11/16/13  2:15 AM      Result Value Ref Range Status   MRSA by PCR NEGATIVE  NEGATIVE Final   Comment:            The GeneXpert MRSA Assay (FDA     approved for NASAL specimens     only), is one component of a     comprehensive MRSA colonization     surveillance program. It is not     intended to diagnose MRSA     infection nor to guide or     monitor treatment for     MRSA infections.  CULTURE, RESPIRATORY (NON-EXPECTORATED)     Status: None   Collection Time    11/17/13  8:27 AM      Result Value Ref Range Status   Specimen  Description TRACHEAL ASPIRATE   Final   Special Requests NONE   Final   Gram Stain     Final   Value: ABUNDANT WBC PRESENT,BOTH PMN AND MONONUCLEAR     FEW SQUAMOUS EPITHELIAL CELLS PRESENT     RARE GRAM POSITIVE COCCI     IN PAIRS     Performed at [facility]   Culture     Final   Value: FEW STAPHYLOCOCCUS AUREUS     Note: RIFAMPIN AND GENTAMICIN SHOULD NOT BE USED AS SINGLE DRUGS FOR TREATMENT OF STAPH INFECTIONS.     Performed at [facility]   Report Status PENDING   Incomplete  URINE CULTURE     Status: None   Collection Time    11/17/13  8:39 AM      Result Value Ref Range Status   Specimen Description URINE, CLEAN CATCH   Final   Special Requests NONE   Final   Culture  Setup Time     Final   Value: 11/17/2013 12:44     Performed at [facility]     Final   Value: >=100,000 COLONIES/ML     Performed at  [facility]     Final   Value: PROTEUS MIRABILIS     Performed at [facility]   Report Status 11/19/2013 FINAL   Final   Organism ID, Bacteria PROTEUS MIRABILIS   Final    Anti-infectives   Start     Dose/Rate Route Frequency Ordered Stop   11/17/13 1030  Ampicillin-Sulbactam (UNASYN) 3 g in sodium chloride 0.9 % 100 mL IVPB     3 g 100 mL/hr over 60 Minutes Intravenous Every 8 hours 11/17/13 0934        Assessment: 80 yo male here with CVA s/p IV tPA and clot retrieval continues on Unasyn for possible aspiration PNA.  WBC down to 9.5, afebrile.  SCr stable at 2.18 and  CrCl ~ 30.  6/30 unasyn>>  6/30 urine: >100K Proteus, R only to MacroBID 6/30 resp: few staph aureus  Plan:  1. Continue Unasyn 3gm IV q8h 2. Will follow renal function, cultures and clinical progress 3. CCM planning on 7 days of therapy per note today. No stop date entered yet.  Reshard Guillet D. Jenicka Coxe, PharmD, BCPS Clinical Pharmacist Pager: [phone] 11/19/2013 12:13 PM

## 2013-11-19 NOTE — Progress Notes (Signed)
Discharge summary reviewed with Peter Minium (pt's son ) questions answered. Home medications given to son.

## 2013-11-19 NOTE — Progress Notes (Signed)
Physical Therapy Treatment Patient Details Name: Jeffery Gibson MRN: [id number] DOB: [birth year] Today's Date: 11/19/2013    History of Present Illness 80 yo with CAD post CABG, AF ( not on anticoagulation due to GI bleed ), pacemaker, HTN, DM, CKD. Presents with left sided weakness and found to have a right MCA stroke. Intubated for airway protection. Underwent systemic and catheter directed TPA.    PT Comments    Patient very reluctant to participate with therapies, max cues for encouragement. Ambulated with increased time and assist with bed mobility.  Patient declines OOB to chair at this time. Vitals assessed, BP elevated 629B systolic. Progressing well despite reluctance, continue with current POC.   Follow Up Recommendations  Home health PT;Supervision/Assistance - 24 hour     Equipment Recommendations  None recommended by PT    Recommendations for Other Services       Precautions / Restrictions Precautions Precautions: Fall Restrictions Weight Bearing Restrictions: No    Mobility  Bed Mobility Overal bed mobility: Needs Assistance Bed Mobility: Supine to Sit;Sit to Supine     Supine to sit: Min assist Sit to supine: Min assist   General bed mobility comments: assist to elevate to upright sitting EOB, heavy reliance on bed rail today, assist for LEs and repositioning in bed  Transfers Overall transfer level: Needs assistance Equipment used: Rolling walker (2 wheeled) Transfers: Sit to/from Stand Sit to Stand: Min assist Stand pivot transfers: Min assist          Ambulation/Gait Ambulation/Gait assistance: Min guard;Min assist Ambulation Distance (Feet): 150 Feet Assistive device: Rolling walker (2 wheeled) Gait Pattern/deviations: Step-through pattern;Decreased stride length;Shuffle;Drifts right/left;Trunk flexed Gait velocity: decreased Gait velocity interpretation: Below normal speed for age/gender General Gait Details: patient with some modest  instability with gait, Cues for upright posture and positioning within RW   Stairs            Wheelchair Mobility    Modified Rankin (Stroke Patients Only) Modified Rankin (Stroke Patients Only) Pre-Morbid Rankin Score: No symptoms Modified Rankin: Moderately severe disability     Balance     Sitting balance-Leahy Scale: Good       Standing balance-Leahy Scale: Fair                      Cognition Arousal/Alertness: Awake/alert Behavior During Therapy: Flat affect Overall Cognitive Status: Impaired/Different from baseline Area of Impairment: Problem solving             Problem Solving: Slow processing;Decreased initiation;Requires verbal cues (? if related to cognition vs HOH)      Exercises      General Comments        Pertinent Vitals/Pain BP systolic 284X, aching pain reported all over, no value given    Home Living                      Prior Function            PT Goals (current goals can now be found in the care plan section) Acute Rehab PT Goals Patient Stated Goal: to get home PT Goal Formulation: With patient/family Time For Goal Achievement: 12/02/13 Potential to Achieve Goals: Good Progress towards PT goals: Progressing toward goals    Frequency  Min 4X/week    PT Plan      Co-evaluation             End of Session Equipment Utilized During Treatment: Gait belt Activity Tolerance:  Patient tolerated treatment well Patient left: in bed;with call bell/phone within reach;with family/visitor present     Time: 0824-0850 PT Time Calculation (min): 26 min  Charges:  $Gait Training: 8-22 mins $Therapeutic Activity: 8-22 mins                    G CodesDuncan Dull 12/04/13, 9:20 AM Alben Deeds, PT DPT  [phone]

## 2013-11-20 LAB — CULTURE, RESPIRATORY W GRAM STAIN

## 2013-11-20 LAB — CULTURE, RESPIRATORY

## 2013-11-20 NOTE — Progress Notes (Addendum)
Spoke with pt's daughter, Lelon Frohlich, on the phone in pt's room prior to his d/c last night after 5pm.  Daughter stated that both the patient and his late wife had previous [facility] but she was unsure of the agencies at that time. She asked that I review records and call her with the past agency name and she could choose from there. Pt's previous HH was with [facility] and the wife's last HH was with [facility].  Called daughter just now and advised her of what I had found in the records. She asked that we use [facility] and that the mobile number listed be the primary phone. Address in [facility] is confirmed as correct.   Sandi Mariscal, RN BSN MHA CCM  Case Manager, Trauma Service/Unit 4M [phone]

## 2013-11-23 ENCOUNTER — Telehealth: Payer: Self-pay | Admitting: Hematology and Oncology

## 2013-11-23 ENCOUNTER — Telehealth: Payer: Self-pay | Admitting: Cardiology

## 2013-11-23 NOTE — Telephone Encounter (Signed)
s.w.l pt daughter and advised on 7.16 appt...ok and aware

## 2013-11-23 NOTE — Telephone Encounter (Signed)
Sent note to Dr Rayann Heman to see if he could work patient in Thursday, schedule full

## 2013-11-23 NOTE — Telephone Encounter (Signed)
New message   Daughter calling patient had a CVA - has an upcoming appt on Thursday wants to know can he have his pacer maker check at the same time. Patient has to be transport by EMS.

## 2013-11-26 ENCOUNTER — Ambulatory Visit (INDEPENDENT_AMBULATORY_CARE_PROVIDER_SITE_OTHER): Payer: Medicare Other | Admitting: Cardiology

## 2013-11-26 ENCOUNTER — Ambulatory Visit (INDEPENDENT_AMBULATORY_CARE_PROVIDER_SITE_OTHER): Payer: Medicare Other | Admitting: Internal Medicine

## 2013-11-26 ENCOUNTER — Encounter: Payer: Self-pay | Admitting: Cardiology

## 2013-11-26 VITALS — BP 153/71 | HR 61 | Ht 68.0 in | Wt 238.0 lb

## 2013-11-26 DIAGNOSIS — I63511 Cerebral infarction due to unspecified occlusion or stenosis of right middle cerebral artery: Secondary | ICD-10-CM

## 2013-11-26 DIAGNOSIS — I2510 Atherosclerotic heart disease of native coronary artery without angina pectoris: Secondary | ICD-10-CM

## 2013-11-26 DIAGNOSIS — I639 Cerebral infarction, unspecified: Secondary | ICD-10-CM

## 2013-11-26 DIAGNOSIS — I480 Paroxysmal atrial fibrillation: Secondary | ICD-10-CM

## 2013-11-26 DIAGNOSIS — E1165 Type 2 diabetes mellitus with hyperglycemia: Secondary | ICD-10-CM

## 2013-11-26 DIAGNOSIS — I6350 Cerebral infarction due to unspecified occlusion or stenosis of unspecified cerebral artery: Secondary | ICD-10-CM

## 2013-11-26 DIAGNOSIS — I509 Heart failure, unspecified: Secondary | ICD-10-CM

## 2013-11-26 DIAGNOSIS — I495 Sick sinus syndrome: Secondary | ICD-10-CM

## 2013-11-26 DIAGNOSIS — I4891 Unspecified atrial fibrillation: Secondary | ICD-10-CM

## 2013-11-26 DIAGNOSIS — IMO0001 Reserved for inherently not codable concepts without codable children: Secondary | ICD-10-CM

## 2013-11-26 DIAGNOSIS — I5022 Chronic systolic (congestive) heart failure: Secondary | ICD-10-CM

## 2013-11-26 LAB — BASIC METABOLIC PANEL
BUN: 29 mg/dL — ABNORMAL HIGH (ref 6–23)
CHLORIDE: 100 meq/L (ref 96–112)
CO2: 23 mEq/L (ref 19–32)
CREATININE: 2.2 mg/dL — AB (ref 0.4–1.5)
Calcium: 9 mg/dL (ref 8.4–10.5)
GFR: 30.53 mL/min — ABNORMAL LOW (ref >60.00)
Glucose, Bld: 139 mg/dL — ABNORMAL HIGH (ref 70–99)
Potassium: 3.7 mEq/L (ref 3.5–5.1)
Sodium: 132 mEq/L — ABNORMAL LOW (ref 135–145)

## 2013-11-26 LAB — MDC_IDC_ENUM_SESS_TYPE_INCLINIC
Battery Voltage: 2.65 V
Brady Statistic AP VS Percent: 21 %
Brady Statistic AS VP Percent: 0 %
Brady Statistic AS VS Percent: 18 %
Lead Channel Impedance Value: 320 Ohm
Lead Channel Pacing Threshold Amplitude: 1 V
Lead Channel Pacing Threshold Pulse Width: 0.4 ms
Lead Channel Pacing Threshold Pulse Width: 0.4 ms
Lead Channel Sensing Intrinsic Amplitude: 8 mV
Lead Channel Setting Pacing Amplitude: 2 V
Lead Channel Setting Sensing Sensitivity: 4 mV
MDC IDC MSMT BATTERY IMPEDANCE: 3814 Ohm
MDC IDC MSMT BATTERY REMAINING LONGEVITY: 8 mo
MDC IDC MSMT LEADCHNL RA IMPEDANCE VALUE: 328 Ohm
MDC IDC MSMT LEADCHNL RA PACING THRESHOLD AMPLITUDE: 1 V
MDC IDC MSMT LEADCHNL RA SENSING INTR AMPL: 1.4 mV
MDC IDC SESS DTM: 20150709084121
MDC IDC SET LEADCHNL RV PACING AMPLITUDE: 2.5 V
MDC IDC SET LEADCHNL RV PACING PULSEWIDTH: 0.4 ms
MDC IDC STAT BRADY AP VP PERCENT: 61 %

## 2013-11-26 LAB — PROTIME-INR
INR: 3.8 ratio — AB (ref 0.8–1.0)
PROTHROMBIN TIME: 40.5 s — AB (ref 9.6–13.1)

## 2013-11-26 LAB — CBC WITH DIFFERENTIAL/PLATELET
Basophils Absolute: 0 10*3/uL (ref 0.0–0.1)
Basophils Relative: 0.2 % (ref 0.0–3.0)
EOS PCT: 3.7 % (ref 0.0–5.0)
Eosinophils Absolute: 0.2 10*3/uL (ref 0.0–0.7)
HEMATOCRIT: 28 % — AB (ref 39.0–52.0)
Hemoglobin: 9.5 g/dL — ABNORMAL LOW (ref 13.0–17.0)
LYMPHS ABS: 0.6 10*3/uL — AB (ref 0.7–4.0)
Lymphocytes Relative: 9.9 % — ABNORMAL LOW (ref 12.0–46.0)
MCHC: 33.9 g/dL (ref 30.0–36.0)
MCV: 99.3 fl (ref 78.0–100.0)
MONO ABS: 0.5 10*3/uL (ref 0.1–1.0)
Monocytes Relative: 8.3 % (ref 3.0–12.0)
Neutro Abs: 4.4 10*3/uL (ref 1.4–7.7)
Neutrophils Relative %: 77.9 % — ABNORMAL HIGH (ref 43.0–77.0)
Platelets: 243 10*3/uL (ref 150.0–400.0)
RBC: 2.82 Mil/uL — ABNORMAL LOW (ref 4.22–5.81)
RDW: 14.2 % (ref 11.5–15.5)
WBC: 5.7 10*3/uL (ref 4.0–10.5)

## 2013-11-26 LAB — HEPATIC FUNCTION PANEL
ALBUMIN: 3 g/dL — AB (ref 3.5–5.2)
ALT: 7 U/L (ref 0–53)
AST: 15 U/L (ref 0–37)
Alkaline Phosphatase: 76 U/L (ref 39–117)
Bilirubin, Direct: 0 mg/dL (ref 0.0–0.3)
Total Bilirubin: 0.6 mg/dL (ref 0.2–1.2)
Total Protein: 6.5 g/dL (ref 6.0–8.3)

## 2013-11-26 NOTE — Progress Notes (Signed)
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  He has had a pacemaker for syncope and SSS 03/27/1996. He reached ERI and underwent pulse generator replacement (MDT) by Dr Verlon Setting 03/02/05.  He is again approaching ERI.  Since last being seen in our clinic, he has had an embolic stroke.  He has been started on coumadin.  He presents today for follow-up with Dr Mare Ferrari.  I am asked to see for device management.   Past Medical History  Diagnosis Date  . Ischemic heart disease 05/06/06    post CARG 05/06/06  . Obese     exogenous  . Renal insufficiency   . Anemia of chronic disease     aranesp injections  . Dyslipidemia   . Coronary artery disease   . Anemia associated with chronic renal failure 04/05/2011  . Cancer     prostate/on Lupron  . Adenocarcinoma of colon 11/2003    stage 2(T3,N0,M0)  . Diabetes mellitus   . Hypertension   . Anginal pain   . Myocardial infarction 1986  . Pacemaker   . Cataracts, bilateral   . Seizures   . Arthritis   . Hemorrhoids   . GERD (gastroesophageal reflux disease)     hx of  . Hematoma     hx of  . Atrial flutter     afib and atrial flutter (permanent)  . Tachycardia-bradycardia 1997    dual-chamber/for tachybradycardia syndrome  . Sleep apnea     no cpap   Past Surgical History  Procedure Laterality Date  . Laparotomy  12/04/2003    resection of rectosigmoid carcinoma/  . Radioactive seed implant  05/2005    transperianeal placement I-125 for prostate cancer/# of  seeds 55  . Cardiac catheterization  05/01/06    EF 40%/diffuse 3 vessel CAD/tight L antereior descending artery stenosis/diffuse disease proximal L anterior descending  . Pacemaker removal  1997  . Fracture surgery      left ankle  . Insert / replace / remove pacemaker  2006  . Colon surgery  2005  . Coronary artery bypass graft  05/06/06    x4 with L internal  mammary artery to the L anterior descending coronary artery  . Laser surgery  2012    prostate  . Orchiectomy  august 2013  . Cystoscopy w/ ureteral stent placement Bilateral 08/04/2012    Procedure: CYSTOSCOPY WITH bilateral retrograde;  Surgeon: Dutch Gray, MD;  Location: [facility];  Service: Urology;  Laterality: Bilateral;  attempted stent placement   . Peripherally inserted central catheter insertion  10-30-12    right upper arm-Antibiotic IV "urinary pseudomonas"  . Cystoscopy w/ ureteral stent placement Bilateral 11/03/2012    Procedure: CYSTOSCOPY WITH STENT REPLACEMENT;  Surgeon: Dutch Gray, MD;  Location: [facility];  Service: Urology;  Laterality: Bilateral;  . Radiology with anesthesia N/A 11/15/2013    Procedure: RADIOLOGY WITH ANESTHESIA;  Surgeon: Rob Hickman, MD;  Location: [facility];  Service: Radiology;  Laterality: N/A;    Current Outpatient Prescriptions  Medication Sig Dispense Refill  . acetaminophen (TYLENOL) 500 MG tablet Take 500 mg by mouth every 6 (six) hours as needed for pain or fever.      Marland Kitchen amiodarone (PACERONE) 200 MG tablet Take 100 mg by mouth daily.      Marland Kitchen amLODipine (NORVASC) 5 MG tablet Take 5 mg by mouth every morning.      Marland Kitchen  amoxicillin-clavulanate (AUGMENTIN) 500-125 MG per tablet Take 1 tablet (500 mg total) by mouth 2 (two) times daily.  9 tablet  0  . Aromatic Inhalants (VICKS VAPOR INHALER IN) Inhale 1 puff into the lungs daily as needed (for dry air).      Marland Kitchen aspirin EC 81 MG tablet Take 81 mg by mouth every evening.       . Calcium Carbonate (CALCIUM 600 PO) Take 1,200 mg by mouth daily with lunch.      . Cholecalciferol (VITAMIN D) 2000 UNITS tablet Take 2,000 Units by mouth daily at 12 noon.       . Darbepoetin Alfa-Albumin (ARANESP IJ) Inject 1 Syringe as directed See admin instructions. Every 4 weeks as needed for anemia if hemoglobin is below 11      . enzalutamide (XTANDI) 40 MG capsule Take 4 capsules (160 mg total) by mouth every morning.  120  capsule  0  . finasteride (PROSCAR) 5 MG tablet Take 5 mg by mouth every evening.       . furosemide (LASIX) 40 MG tablet Take 20 mg by mouth as directed.       . insulin glargine (LANTUS) 100 UNIT/ML injection 8 units daily or as directed      . metoprolol (LOPRESSOR) 50 MG tablet Take 50 mg by mouth 2 (two) times daily.      . mirabegron ER (MYRBETRIQ) 50 MG TB24 tablet Take 50 mg by mouth at bedtime.      Marland Kitchen omeprazole (PRILOSEC) 20 MG capsule Take 20 mg by mouth at bedtime.      . ondansetron (ZOFRAN) 4 MG tablet Take 4 mg by mouth every 8 (eight) hours as needed for nausea or vomiting.      . OXYCODONE HCL PO Take 325 lb by mouth.      . Psyllium (METAMUCIL PO) Take by mouth daily.      . rosuvastatin (CRESTOR) 10 MG tablet Take 5 mg by mouth every evening.       . sertraline (ZOLOFT) 50 MG tablet Take 50 mg by mouth daily. take 1/2  Tab can up to one tab after a week if can handle it c      . ursodiol (ACTIGALL) 300 MG capsule Take 300 mg by mouth 2 (two) times daily.       . vitamin C (ASCORBIC ACID) 500 MG tablet Take 500 mg by mouth daily.      Marland Kitchen warfarin (COUMADIN) 5 MG tablet Take 1 tablet (5 mg total) by mouth daily.  60 tablet  2   No current facility-administered medications for this visit.    Physical Exam: Vitals as per Dr Ancil Linsey- The patient is elderly appearing, in a stretcher today Head- normocephalic, atraumatic Eyes-  Sclera clear, conjunctiva pink Ears- hearing intact Oropharynx- clear Lungs- Clear to ausculation bilaterally, normal work of breathing Chest- R sided pacemaker pocket is well healed Heart- Regular rate and rhythm (paced) GI- soft, NT, ND, + BS Extremities- no clubbing, cyanosis, or edema  Pacemaker interrogation- reviewed in detail today,  See [facility] report  Assessment and Plan:  1. Tachycardia/ bradycardia syndrome Normal pacemaker function See [facility] Art report Reprogrammed from DDIR to MVP today Battery with 8 months estimated  longevity (range <1-16 months) Will follow with montly carelink transmissions- the importance of compliance was discussed with patient and family Once ERI, he can be set up for generator change with me  2. afib Well controlled He is now starting  anticoagulation given recent stroke Dr Mare Ferrari to follow   He will continue monthly carelink and I will see in 1 year (sooner if he reaches ERI)

## 2013-11-26 NOTE — Patient Instructions (Addendum)
Will obtain labs today and call you with the results (CBC/PT/INR,HFP,BMET)  DECREASE YOUR LANTUS TO 8 UNITS DAILY  Start Metamucil daily   Will arrange home health to check coumadin twice a week  Your physician recommends that you schedule a follow-up appointment in: August 12   Will need to do a remote pacer check at home August 10  Your physician wants you to follow-up in: 1 year with DR Rayann Heman You will receive a reminder letter in the mail two months in advance. If you don't receive a letter, please call our office to schedule the follow-up appointment.

## 2013-11-26 NOTE — Assessment & Plan Note (Signed)
His pacemaker was checked today by Dr. Rayann Heman and his staff.

## 2013-11-26 NOTE — Progress Notes (Signed)
Quick Note:  Please report to patient. The recent labs are stable. Continue same medication and careful diet. No cause for the nausea is found. The serum sodium is slightly low and he can try adding small quantity of salt to his food. His hemoglobin is improving. His white count is normal. Continue current medication. ______

## 2013-11-26 NOTE — Progress Notes (Signed)
Jeffery Gibson Date of Birth:  Jun 12, 1928 [place] [address] [place], [place]  [address] [phone]        Fax   [phone]   History of Present Illness: This pleasant 80 year old gentleman is seen for a post hospital office visit.  He arrived on a stretcher.  He was recently hospitalized for an acute embolic stroke to his right middle cerebral artery, treated with thrombolytic therapy and interventional radiology successfully.  He is still very weak.  He was going to start on physical therapy and occupational therapy.  The patient had a history of known paroxysmal atrial fibrillation.  Because of previous GI bleeds he had not been on Coumadin.  He is now on Coumadin following his embolic stroke.  He has a complex past medical history. He has known ischemic heart disease. He is diabetic. He has a history of tachybradycardia syndrome and paroxysmal atrial fibrillation. He has a pacemaker. He has a malignant neoplasm of the prostate with metastases and recently underwent orchiectomy at [facility] on January 18, 2012. However his PSA is still rising slowly and most recently he was hospitalized and on a nuclear bone scan was found to have a metastatic prostate cancer to his left sixth rib. The patient is now on Xtandi for his metastatic prostate cancer.  Since arriving home from the hospital he has been very nauseated.  He has had some mild hypoglycemic episodes.  He has not been able to take his Gillermina Phy because of his nausea.  That medication makes his nausea worse.   Current Outpatient Prescriptions  Medication Sig Dispense Refill  . acetaminophen (TYLENOL) 500 MG tablet Take 500 mg by mouth every 6 (six) hours as needed for pain or fever.      Marland Kitchen amiodarone (PACERONE) 200 MG tablet Take 100 mg by mouth daily.      Marland Kitchen amLODipine (NORVASC) 5 MG tablet Take 5 mg by mouth every morning.      Marland Kitchen amoxicillin-clavulanate (AUGMENTIN) 500-125 MG per tablet Take 1  tablet (500 mg total) by mouth 2 (two) times daily.  9 tablet  0  . Aromatic Inhalants (VICKS VAPOR INHALER IN) Inhale 1 puff into the lungs daily as needed (for dry air).      Marland Kitchen aspirin EC 81 MG tablet Take 81 mg by mouth every evening.       . Calcium Carbonate (CALCIUM 600 PO) Take 1,200 mg by mouth daily with lunch.      . Cholecalciferol (VITAMIN D) 2000 UNITS tablet Take 2,000 Units by mouth daily at 12 noon.       . Darbepoetin Alfa-Albumin (ARANESP IJ) Inject 1 Syringe as directed See admin instructions. Every 4 weeks as needed for anemia if hemoglobin is below 11      . enzalutamide (XTANDI) 40 MG capsule Take 4 capsules (160 mg total) by mouth every morning.  120 capsule  0  . finasteride (PROSCAR) 5 MG tablet Take 5 mg by mouth every evening.       . furosemide (LASIX) 40 MG tablet Take 20 mg by mouth as directed.       . insulin glargine (LANTUS) 100 UNIT/ML injection 8 units daily or as directed      . metoprolol (LOPRESSOR) 50 MG tablet Take 50 mg by mouth 2 (two) times daily.      . mirabegron ER (MYRBETRIQ) 50 MG TB24 tablet Take 50 mg by mouth at bedtime.      Marland Kitchen  omeprazole (PRILOSEC) 20 MG capsule Take 20 mg by mouth at bedtime.      . ondansetron (ZOFRAN) 4 MG tablet Take 4 mg by mouth every 8 (eight) hours as needed for nausea or vomiting.      . OXYCODONE HCL PO Take 325 lb by mouth.      . Psyllium (METAMUCIL PO) Take by mouth daily.      . rosuvastatin (CRESTOR) 10 MG tablet Take 5 mg by mouth every evening.       . sertraline (ZOLOFT) 50 MG tablet Take 50 mg by mouth daily. take 1/2  Tab can up to one tab after a week if can handle it c      . ursodiol (ACTIGALL) 300 MG capsule Take 300 mg by mouth 2 (two) times daily.       . vitamin C (ASCORBIC ACID) 500 MG tablet Take 500 mg by mouth daily.      Marland Kitchen warfarin (COUMADIN) 5 MG tablet Take 1 tablet (5 mg total) by mouth daily.  60 tablet  2   No current facility-administered medications for this visit.    Allergies    Allergen Reactions  . Adhesive [Tape] Rash    Rash and itching. USE PAPER TAPE ONLY  . Latex Hives  . Lovenox [Enoxaparin Sodium]     Gi bleed  . Lupron [Leuprolide Acetate]     Kidney failure  . Warfarin And Related     Gi bleed    Patient Active Problem List   Diagnosis Date Noted  . BRADYCARDIA-TACHYCARDIA SYNDROME 07/26/2010    Priority: High  . PROSTATE CANCER 07/26/2010    Priority: Medium  . CAD 07/26/2010    Priority: Medium  . Stroke 11/15/2013  . Hyponatremia 08/20/2012  . Sepsis secondary to urinary tract infection, complicated 76/73/4193  . Stage III chronic kidney disease 08/19/2012  . Normocytic anemia 08/19/2012  . Pulmonary nodule, right middle lobe 08/19/2012  . Pacemaker 06/16/2012  . Chronic systolic congestive heart failure 06/16/2012  . UTI (lower urinary tract infection) 11/13/2011  . Paroxysmal a-fib 11/09/2011  . Diabetes mellitus 11/07/2011  . HTN (hypertension) 11/07/2011  . Anemia associated with chronic renal failure 04/05/2011  . ADENOMATOUS COLONIC POLYP 07/26/2010  . HYPERLIPIDEMIA 07/26/2010  . MYOCARDIAL INFARCTION 07/26/2010    History  Smoking status  . Former Smoker -- 10 years  . Types: Cigarettes  . Quit date: 05/22/1963  Smokeless tobacco  . Never Used    History  Alcohol Use No    Family History  Problem Relation Age of Onset  . Heart failure Father 66  . Gallbladder disease Father   . Cancer Son     glioblastoma    Review of Systems: Constitutional: no fever chills diaphoresis or fatigue or change in weight.  Head and neck: no hearing loss, no epistaxis, no photophobia or visual disturbance. Respiratory: No cough, shortness of breath or wheezing. Cardiovascular: No chest pain peripheral edema, palpitations. Gastrointestinal: No abdominal distention, no abdominal pain, no change in bowel habits hematochezia or melena. Genitourinary: No dysuria, no frequency, no urgency, no nocturia. Musculoskeletal:No  arthralgias, no back pain, no gait disturbance or myalgias. Neurological: No dizziness, no headaches, no numbness, no seizures, no syncope, no weakness, no tremors. Hematologic: No lymphadenopathy, no easy bruising. Psychiatric: No confusion, no hallucinations, no sleep disturbance.    Physical Exam: Filed Vitals:   11/26/13 0808  BP: 153/71  Pulse: 61   the general appearance reveals a discharge showed gentleman on  a stretcher.  He has only very mild left-sided weakness.The head and neck exam reveals pupils equal and reactive.  Extraocular movements are full.  There is no scleral icterus.  The mouth and pharynx are normal.  The neck is supple.  The carotids reveal no bruits.  The jugular venous pressure is normal.  The  thyroid is not enlarged.  There is no lymphadenopathy.   There are mild rales at the left base.  Expansion of the chest is symmetrical.  The precordium is quiet.  There is a pacemaker in the right upper chest.  The first heart sound is normal.  The second heart sound is physiologically split.  There is no murmur gallop rub or click.  There is no abnormal lift or heave.  The abdomen is soft and nontender.  The bowel sounds are normal.  The liver and spleen are not enlarged.  There are no abdominal masses.  There are no abdominal bruits.  Extremities reveal good pedal pulses.  There is no phlebitis or edema.  There is no cyanosis or clubbing.  Strength is normal and symmetrical in all extremities.  There is no lateralizing weakness.  There are no sensory deficits.  The skin is warm and dry.  There is no rash.    Assessment / Plan: 1. recent embolic stroke to his right middle cerebral artery. 2. paroxysmal atrial fibrillation.  Not previously on warfarin because of GI bleeding.  Now on warfarin. 3. nausea and poor intake of food 4. situational depression 5. tachybradycardia syndrome with pacemaker 6.  Diabetes mellitus 7. postdates cancer with bony metastases 8. ischemic heart  disease 9. chronic renal insufficiency  Plan: Continue current medication.  Check in one month for office visit and EKG .  We will ask the home health nurse to check his prothrombin times at home 3 times a week initially because his INRs are going to be more erratic in the face of poor oral intake of food. He is having constipation and I suggested that he add Metamucil to his diet

## 2013-11-26 NOTE — Assessment & Plan Note (Signed)
The patient's weight is still up about 10 pounds from what he used to run, felt to be secondary to IV fluid resuscitation.  He is now on furosemide 40 mg daily.

## 2013-11-26 NOTE — Assessment & Plan Note (Signed)
The patient has not been experiencing any angina pectoris.  He has been very discouraged and depressed over the recent loss of his wife to cancer and now having had his stroke.

## 2013-11-27 ENCOUNTER — Telehealth: Payer: Self-pay | Admitting: Cardiology

## 2013-11-27 ENCOUNTER — Ambulatory Visit (INDEPENDENT_AMBULATORY_CARE_PROVIDER_SITE_OTHER): Payer: Medicare Other | Admitting: Pharmacist

## 2013-11-27 DIAGNOSIS — I639 Cerebral infarction, unspecified: Secondary | ICD-10-CM

## 2013-11-27 DIAGNOSIS — I6350 Cerebral infarction due to unspecified occlusion or stenosis of unspecified cerebral artery: Secondary | ICD-10-CM

## 2013-11-27 NOTE — Telephone Encounter (Signed)
Agree with advice given

## 2013-11-27 NOTE — Telephone Encounter (Signed)
New message     Son called EMS this am (4am) because his dad had an "episode"--eyes open but unresponsive.  After ambulance arrived--pt refused to go to hosp. This morning pt feels fine but did not remember ambulance being there.  Son want to talk to a nurse

## 2013-11-27 NOTE — Telephone Encounter (Signed)
Spoke with Donita and explained importance of INR Monday, she stated someone would go out Monday and check

## 2013-11-27 NOTE — Telephone Encounter (Signed)
New message    Want order to check  Pt/inr on Tuesday when she sees him again

## 2013-11-27 NOTE — Telephone Encounter (Signed)
Spoke with son regarding patient. This morning around 4:00 am patient wanted to get up and lay on the couch secondary to nausea. When he was trying to help him up patient got dizzy and fell back into the chair. His head then fell to the side and mouth hung open and he was not responding. Jeneen Rinks had his sister call 911. He did come around quickly. EMS checked VS all ok per son, blood sugar 165. No nuero follow up scheduled yet, advised to call the office to get appointment since he has not heard from them. Wanted for  Dr. Mare Ferrari to be aware and know if he had any recommendations. Will forward for review

## 2013-11-27 NOTE — Telephone Encounter (Signed)
Left message to call back  

## 2013-11-30 ENCOUNTER — Telehealth: Payer: Self-pay | Admitting: Cardiology

## 2013-11-30 ENCOUNTER — Ambulatory Visit (INDEPENDENT_AMBULATORY_CARE_PROVIDER_SITE_OTHER): Payer: Medicare Other | Admitting: Internal Medicine

## 2013-11-30 DIAGNOSIS — I639 Cerebral infarction, unspecified: Secondary | ICD-10-CM

## 2013-11-30 DIAGNOSIS — I6350 Cerebral infarction due to unspecified occlusion or stenosis of unspecified cerebral artery: Secondary | ICD-10-CM

## 2013-11-30 LAB — POCT INR: INR: 2.2

## 2013-12-02 ENCOUNTER — Telehealth: Payer: Self-pay | Admitting: Cardiology

## 2013-12-02 ENCOUNTER — Ambulatory Visit (INDEPENDENT_AMBULATORY_CARE_PROVIDER_SITE_OTHER): Payer: Medicare Other | Admitting: Cardiology

## 2013-12-02 DIAGNOSIS — I639 Cerebral infarction, unspecified: Secondary | ICD-10-CM

## 2013-12-02 DIAGNOSIS — I6350 Cerebral infarction due to unspecified occlusion or stenosis of unspecified cerebral artery: Secondary | ICD-10-CM

## 2013-12-02 LAB — PROTIME-INR: INR: 3.5 — AB (ref 0.9–1.1)

## 2013-12-02 LAB — POCT INR: INR: 4.8

## 2013-12-02 MED ORDER — WARFARIN SODIUM 2.5 MG PO TABS: 2.5000 mg | ORAL_TABLET | ORAL | Status: DC

## 2013-12-02 NOTE — Telephone Encounter (Signed)
New message     Home health nurse took pt pt/inr level.  It was 4.8.  The coumadin clinic is holding his medication but the family want to know if pt should hold his aspirin also.  Please call

## 2013-12-02 NOTE — Telephone Encounter (Signed)
[facility] home care call he states pt's INR is 4.8 the coumadin clinic is holding his warfarin , does he needs to hold the aspirin? Jeneen Rinks was made aware that the aspirin has a different preperties so different effect in the body. So he does not need to stop taking the aspirin,  unless it makes the family feel better. Jeneen Rinks verbalized understanding.

## 2013-12-02 NOTE — Telephone Encounter (Signed)
Left Jeffery Gibson a message to call back.

## 2013-12-03 ENCOUNTER — Telehealth: Payer: Self-pay | Admitting: *Deleted

## 2013-12-03 ENCOUNTER — Ambulatory Visit: Payer: Medicare Other

## 2013-12-03 ENCOUNTER — Ambulatory Visit (HOSPITAL_BASED_OUTPATIENT_CLINIC_OR_DEPARTMENT_OTHER): Payer: Medicare Other | Admitting: Hematology and Oncology

## 2013-12-03 ENCOUNTER — Other Ambulatory Visit (HOSPITAL_COMMUNITY): Payer: Self-pay | Admitting: Interventional Radiology

## 2013-12-03 ENCOUNTER — Encounter: Payer: Self-pay | Admitting: Hematology and Oncology

## 2013-12-03 ENCOUNTER — Other Ambulatory Visit (HOSPITAL_BASED_OUTPATIENT_CLINIC_OR_DEPARTMENT_OTHER): Payer: Medicare Other

## 2013-12-03 ENCOUNTER — Encounter: Payer: Self-pay | Admitting: Internal Medicine

## 2013-12-03 VITALS — BP 128/62 | HR 78 | Temp 97.8°F | Resp 18 | Ht 68.0 in

## 2013-12-03 DIAGNOSIS — M898X9 Other specified disorders of bone, unspecified site: Secondary | ICD-10-CM | POA: Insufficient documentation

## 2013-12-03 DIAGNOSIS — D631 Anemia in chronic kidney disease: Secondary | ICD-10-CM

## 2013-12-03 DIAGNOSIS — M949 Disorder of cartilage, unspecified: Secondary | ICD-10-CM

## 2013-12-03 DIAGNOSIS — C61 Malignant neoplasm of prostate: Secondary | ICD-10-CM

## 2013-12-03 DIAGNOSIS — C7952 Secondary malignant neoplasm of bone marrow: Secondary | ICD-10-CM

## 2013-12-03 DIAGNOSIS — H1032 Unspecified acute conjunctivitis, left eye: Secondary | ICD-10-CM | POA: Insufficient documentation

## 2013-12-03 DIAGNOSIS — I639 Cerebral infarction, unspecified: Secondary | ICD-10-CM

## 2013-12-03 DIAGNOSIS — M899 Disorder of bone, unspecified: Secondary | ICD-10-CM

## 2013-12-03 DIAGNOSIS — R110 Nausea: Secondary | ICD-10-CM

## 2013-12-03 DIAGNOSIS — D649 Anemia, unspecified: Secondary | ICD-10-CM

## 2013-12-03 DIAGNOSIS — I39 Endocarditis and heart valve disorders in diseases classified elsewhere: Secondary | ICD-10-CM

## 2013-12-03 DIAGNOSIS — H1030 Unspecified acute conjunctivitis, unspecified eye: Secondary | ICD-10-CM

## 2013-12-03 DIAGNOSIS — N390 Urinary tract infection, site not specified: Secondary | ICD-10-CM

## 2013-12-03 DIAGNOSIS — N189 Chronic kidney disease, unspecified: Secondary | ICD-10-CM

## 2013-12-03 DIAGNOSIS — C7951 Secondary malignant neoplasm of bone: Secondary | ICD-10-CM

## 2013-12-03 LAB — CBC & DIFF AND RETIC
BASO%: 0.2 % (ref 0.0–2.0)
Basophils Absolute: 0 10*3/uL (ref 0.0–0.1)
EOS%: 2.4 % (ref 0.0–7.0)
Eosinophils Absolute: 0.2 10*3/uL (ref 0.0–0.5)
HEMATOCRIT: 29.3 % — AB (ref 38.4–49.9)
HGB: 10 g/dL — ABNORMAL LOW (ref 13.0–17.1)
IMMATURE RETIC FRACT: 6.7 % (ref 3.00–10.60)
LYMPH#: 0.7 10*3/uL — AB (ref 0.9–3.3)
LYMPH%: 9 % — ABNORMAL LOW (ref 14.0–49.0)
MCH: 32.7 pg (ref 27.2–33.4)
MCHC: 34.1 g/dL (ref 32.0–36.0)
MCV: 95.8 fL (ref 79.3–98.0)
MONO#: 0.9 10*3/uL (ref 0.1–0.9)
MONO%: 11 % (ref 0.0–14.0)
NEUT#: 6.3 10*3/uL (ref 1.5–6.5)
NEUT%: 77.4 % — AB (ref 39.0–75.0)
NRBC: 0 % (ref 0–0)
Platelets: 219 10*3/uL (ref 140–400)
RBC: 3.06 10*6/uL — AB (ref 4.20–5.82)
RDW: 13.9 % (ref 11.0–14.6)
Retic %: 1.95 % — ABNORMAL HIGH (ref 0.80–1.80)
Retic Ct Abs: 59.67 10*3/uL (ref 34.80–93.90)
WBC: 8.1 10*3/uL (ref 4.0–10.3)

## 2013-12-03 MED ORDER — PROMETHAZINE HCL 25 MG PO TABS: 25.0000 mg | ORAL_TABLET | Freq: Four times a day (QID) | ORAL | Status: AC | PRN

## 2013-12-03 MED ORDER — TOBRAMYCIN 0.3 % OP SOLN: 1.0000 [drp] | OPHTHALMIC | Status: AC

## 2013-12-03 MED ORDER — AMOXICILLIN 500 MG PO TABS: 500.0000 mg | ORAL_TABLET | Freq: Two times a day (BID) | ORAL | Status: DC

## 2013-12-03 MED ORDER — MORPHINE SULFATE (CONCENTRATE) 20 MG/ML PO SOLN: 10.0000 mg | ORAL | Status: AC | PRN

## 2013-12-03 NOTE — Assessment & Plan Note (Signed)
This is likely related to his metastatic cancer. I will increase his pain medicine and discussed pain management with the hospice nurse today.

## 2013-12-03 NOTE — Progress Notes (Signed)
[facility] OFFICE PROGRESS NOTE  Patient Care Team: Velna Hatchet, MD as PCP - General (Internal Medicine)  CHIEF COMPLAINTS/PURPOSE OF VISIT:  #1 anemia due to chronic kidney failure, was on treatment with Aranesp #2 history of stage II colon cancer, no evidence of recurrence, T3, N0, M0 #3 metastatic prostate cancer to the bone, refractory disease with rising PSA #4 recent embolic stroke, on warfarin therapy HISTORY OF PRESENTING ILLNESS:  This is a very pleasant 80 year old gentleman accompanied by family members today.  He has background history of colon cancer, resected in 2005. It was discovered on routine colonoscopy. He never received adjuvant treatment. In 2004, the patient was diagnosed with prostate cancer and was treated with radioactive seed implantation. He was recently was found to have metastatic disease in his bone and was undergoing treatment with enzalutamide.  The patient also have anemia of chronic renal failure. He has been receiving erythropoietin stimulating agents to keep his hemoglobin above 11 g.  INTERVAL HISTORY: Please see below for problem oriented charting. The patient was recently hospitalized due to acute stroke. He has persistent lower extremity weakness and had placement of a urinary catheter due to weakness and difficulties getting out of bed. He complained of severe bone pain throughout his body especially on his back. He also complained of severe nausea, uncontrolled with current anti-emetics. Family members noted passage of dark urine which was discolored and cloudy from the urinary catheter. Recently, he has new-onset of left eye conjunctivitis.  His appetite is very poor. His anticoagulation therapy was monitored by his cardiologist. The patient denies any recent signs or symptoms of bleeding such as spontaneous epistaxis, hematuria or hematochezia.   REVIEW OF SYSTEMS:   Constitutional: Denies fevers, chills  Eyes: Denies  blurriness of vision Ears, nose, mouth, throat, and face: Denies mucositis or sore throat Respiratory: Denies cough, dyspnea or wheezes Cardiovascular: Denies palpitation, chest discomfort or lower extremity swelling Skin: Denies abnormal skin rashes Lymphatics: Denies new lymphadenopathy or easy bruising Behavioral/Psych: The patient feels bad and depressed All other systems were reviewed with the patient and are negative.  I have reviewed the past medical history, past surgical history, social history and family history with the patient and they are unchanged from previous note.  ALLERGIES:  is allergic to adhesive; latex; lovenox; lupron; and warfarin and related.  MEDICATIONS:  Current Outpatient Prescriptions  Medication Sig Dispense Refill  . amiodarone (PACERONE) 200 MG tablet Take 100 mg by mouth daily.      Marland Kitchen amLODipine (NORVASC) 5 MG tablet Take 5 mg by mouth every morning.      Marland Kitchen aspirin EC 81 MG tablet Take 81 mg by mouth every evening.       . Calcium Carbonate (CALCIUM 600 PO) Take 1,200 mg by mouth daily with lunch.      . Cholecalciferol (VITAMIN D) 2000 UNITS tablet Take 2,000 Units by mouth daily at 12 noon.       . finasteride (PROSCAR) 5 MG tablet Take 5 mg by mouth every evening.       . insulin glargine (LANTUS) 100 UNIT/ML injection 8 units daily or as directed      . Linaclotide (LINZESS) 145 MCG CAPS capsule Take 145 mcg by mouth every other day.      . metoprolol (LOPRESSOR) 50 MG tablet Take 50 mg by mouth 2 (two) times daily.      . mirabegron ER (MYRBETRIQ) 50 MG TB24 tablet Take 50 mg by mouth at bedtime.      Marland Kitchen  omeprazole (PRILOSEC) 20 MG capsule Take 20 mg by mouth at bedtime.      . ondansetron (ZOFRAN) 4 MG tablet Take 4 mg by mouth every 8 (eight) hours as needed for nausea or vomiting.      . rosuvastatin (CRESTOR) 10 MG tablet Take 5 mg by mouth every evening.       . sertraline (ZOLOFT) 50 MG tablet Take 50 mg by mouth daily. take 1/2  Tab can up to  one tab after a week if can handle it c      . tamsulosin (FLOMAX) 0.4 MG CAPS capsule Take 0.4 mg by mouth daily after supper.      . ursodiol (ACTIGALL) 300 MG capsule Take 300 mg by mouth 2 (two) times daily.       . vitamin C (ASCORBIC ACID) 500 MG tablet Take 500 mg by mouth daily.      . Aromatic Inhalants (VICKS VAPOR INHALER IN) Inhale 1 puff into the lungs daily as needed (for dry air).      . enzalutamide (XTANDI) 40 MG capsule Take 4 capsules (160 mg total) by mouth every morning.  120 capsule  0  . furosemide (LASIX) 40 MG tablet Take 40 mg by mouth daily.       Marland Kitchen morphine (ROXANOL) 20 MG/ML concentrated solution Take 0.5 mLs (10 mg total) by mouth every 2 (two) hours as needed for severe pain.  240 mL  0  . promethazine (PHENERGAN) 25 MG tablet Take 1 tablet (25 mg total) by mouth every 6 (six) hours as needed for nausea.  30 tablet  3  . tobramycin (TOBREX) 0.3 % ophthalmic solution Place 1 drop into the left eye every 4 (four) hours.  5 mL  0   No current facility-administered medications for this visit.    PHYSICAL EXAMINATION: ECOG PERFORMANCE STATUS: 3  Filed Vitals:   12/03/13 1119  BP: 128/62  Pulse: 78  Temp: 97.8 F (36.6 C)  Resp: 18   Filed Weights    GENERAL:alert, but appears to be in distress and uncomfortable SKIN: skin color, texture, turgor are normal, no rashes or significant lesions EYES: Noted conjunctivitis on the left side. OROPHARYNX: noted dry mucous membranes. No thrush. NECK: supple, thyroid normal size, non-tender, without nodularity LYMPH:  no palpable lymphadenopathy in the cervical, axillary or inguinal LUNGS: clear to auscultation and percussion with normal breathing effort HEART: regular rate & rhythm and no murmurs and no lower extremity edema ABDOMEN:abdomen soft, non-tender and normal bowel sounds Musculoskeletal:no cyanosis of digits and no clubbing  NEURO: alert & oriented x 3 with fluent speech, with significant weakness  throughout.  LABORATORY DATA:  I have reviewed the data as listed    Component Value Date/Time   NA 132* 11/26/2013 0908   NA 137 10/08/2013 1056   NA 141 02/09/2010 1156   K 3.7 11/26/2013 0908   K 4.7 10/08/2013 1056   K 4.5 02/09/2010 1156   CL 100 11/26/2013 0908   CL 104 07/10/2012 1115   CL 98 02/09/2010 1156   CO2 23 11/26/2013 0908   CO2 21* 10/08/2013 1056   CO2 25 02/09/2010 1156   GLUCOSE 139* 11/26/2013 0908   GLUCOSE 166* 10/08/2013 1056   GLUCOSE 247* 07/10/2012 1115   GLUCOSE 141* 02/09/2010 1156   BUN 29* 11/26/2013 0908   BUN 43.8* 10/08/2013 1056   BUN 36* 02/09/2010 1156   CREATININE 2.2* 11/26/2013 0908   CREATININE 2.2* 10/08/2013 1056  CREATININE 2.88* 12/28/2011 1546   CALCIUM 9.0 11/26/2013 0908   CALCIUM 9.8 10/08/2013 1056   CALCIUM 8.6 02/09/2010 1156   PROT 6.5 11/26/2013 0908   PROT 6.9 10/08/2013 1056   PROT 7.0 02/09/2010 1156   ALBUMIN 3.0* 11/26/2013 0908   ALBUMIN 3.3* 10/08/2013 1056   AST 15 11/26/2013 0908   AST 10 10/08/2013 1056   AST 25 02/09/2010 1156   ALT 7 11/26/2013 0908   ALT <6 10/08/2013 1056   ALT 24 02/09/2010 1156   ALKPHOS 76 11/26/2013 0908   ALKPHOS 88 10/08/2013 1056   ALKPHOS 77 02/09/2010 1156   BILITOT 0.6 11/26/2013 0908   BILITOT 0.45 10/08/2013 1056   BILITOT 0.70 02/09/2010 1156   GFRNONAA 26* 11/19/2013 0234   GFRAA 30* 11/19/2013 0234    No results found for this basename: SPEP,  UPEP,   kappa and lambda light chains    Lab Results  Component Value Date   WBC 8.1 12/03/2013   NEUTROABS 6.3 12/03/2013   HGB 10.0* 12/03/2013   HCT 29.3* 12/03/2013   MCV 95.8 12/03/2013   PLT 219 12/03/2013      Chemistry      Component Value Date/Time   NA 132* 11/26/2013 0908   NA 137 10/08/2013 1056   NA 141 02/09/2010 1156   K 3.7 11/26/2013 0908   K 4.7 10/08/2013 1056   K 4.5 02/09/2010 1156   CL 100 11/26/2013 0908   CL 104 07/10/2012 1115   CL 98 02/09/2010 1156   CO2 23 11/26/2013 0908   CO2 21* 10/08/2013 1056   CO2 25 02/09/2010 1156   BUN 29* 11/26/2013 0908   BUN  43.8* 10/08/2013 1056   BUN 36* 02/09/2010 1156   CREATININE 2.2* 11/26/2013 0908   CREATININE 2.2* 10/08/2013 1056   CREATININE 2.88* 12/28/2011 1546      Component Value Date/Time   CALCIUM 9.0 11/26/2013 0908   CALCIUM 9.8 10/08/2013 1056   CALCIUM 8.6 02/09/2010 1156   ALKPHOS 76 11/26/2013 0908   ALKPHOS 88 10/08/2013 1056   ALKPHOS 77 02/09/2010 1156   AST 15 11/26/2013 0908   AST 10 10/08/2013 1056   AST 25 02/09/2010 1156   ALT 7 11/26/2013 0908   ALT <6 10/08/2013 1056   ALT 24 02/09/2010 1156   BILITOT 0.6 11/26/2013 0908   BILITOT 0.45 10/08/2013 1056   BILITOT 0.70 02/09/2010 1156      ASSESSMENT & PLAN:  PROSTATE CANCER This patient has stage IV metastatic prostate cancer to the bone. His PSA continued to rise and the patient is hormone refractory. Prognosis is poor and given his recent stroke, he is not a candidate for systemic treatment. I had a long discussion with the patient and family members and they all in agreement to be enrolled in hospice. I've also discussed CODE STATUS with the hospice nurse and will change the patient's current status to DO NOT RESUSCITATE and DO NOT INTUBATE.   Normocytic anemia This is likely anemia of chronic disease. The patient denies recent history of bleeding such as epistaxis, hematuria or hematochezia. He is asymptomatic from the anemia. We will observe for now.  He does not require transfusion now.    Conjunctivitis, acute, left eye I have prescribed eye drops to treat his eye infection.  Bone pain This is likely related to his metastatic cancer. I will increase his pain medicine and discussed pain management with the hospice nurse today.  UTI (lower urinary tract infection)  I will prescribe him with amoxicillin. I recommend a change of his urinary catheter.  Stroke He was treated appropriately on Coumadin. I discussed with the patient and family members the difficulties of monitoring his INR, given his poor appetite, interaction with  antibiotics, difficulties with monitoring, and etc. We are in agreement to discontinue warfarin therapy. I recommend he take two baby aspirin per day.  Nausea alone This is multifactorial. I recommend increasing Zofran to 8 mg 3 times a day when necessary and to take Phenergan as needed if nausea is not controlled by Zofran. I warned him and family about the risk of constipation and headaches with regular Zofran.   I also recommend eliminating unnecessary medications and focus on quality of life measures only. All questions were answered. The patient knows to call the clinic with any problems, questions or concerns. No barriers to learning was detected. I spent 40 minutes counseling the patient face to face. The total time spent in the appointment was 55 minutes and more than 50% was on counseling and review of test results     [facility], [place], [place] 12/03/2013 8:27 PM

## 2013-12-03 NOTE — Assessment & Plan Note (Signed)
He was treated appropriately on Coumadin. I discussed with the patient and family members the difficulties of monitoring his INR, given his poor appetite, interaction with antibiotics, difficulties with monitoring, and etc. We are in agreement to discontinue warfarin therapy. I recommend he take two baby aspirin per day.

## 2013-12-03 NOTE — Assessment & Plan Note (Signed)
I will prescribe him with amoxicillin. I recommend a change of his urinary catheter.

## 2013-12-03 NOTE — Telephone Encounter (Signed)
Referral made to [facility] for home care.  Gave contact number for pt's daughter Verdene Rio, cell [phone] or pt's home number [phone].

## 2013-12-03 NOTE — Telephone Encounter (Signed)
That is reasonable in his situation.

## 2013-12-03 NOTE — Assessment & Plan Note (Signed)
This is multifactorial. I recommend increasing Zofran to 8 mg 3 times a day when necessary and to take Phenergan as needed if nausea is not controlled by Zofran. I warned him and family about the risk of constipation and headaches with regular Zofran.

## 2013-12-03 NOTE — Assessment & Plan Note (Signed)
This patient has stage IV metastatic prostate cancer to the bone. His PSA continued to rise and the patient is hormone refractory. Prognosis is poor and given his recent stroke, he is not a candidate for systemic treatment. I had a long discussion with the patient and family members and they all in agreement to be enrolled in hospice. I've also discussed CODE STATUS with the hospice nurse and will change the patient's current status to DO NOT RESUSCITATE and DO NOT INTUBATE.

## 2013-12-03 NOTE — Telephone Encounter (Signed)
Received a call from Hospice doctor. Patient and family have stopped Warfarin and will just use ASA since patient is declining. Will forward to  Dr. Mare Ferrari and Coumadin Clinic

## 2013-12-03 NOTE — Assessment & Plan Note (Signed)
I have prescribed eye drops to treat his eye infection.

## 2013-12-03 NOTE — Assessment & Plan Note (Signed)
This is likely anemia of chronic disease. The patient denies recent history of bleeding such as epistaxis, hematuria or hematochezia. He is asymptomatic from the anemia. We will observe for now.  He does not require transfusion now.   

## 2013-12-03 NOTE — Telephone Encounter (Signed)
Hospice called requesting information about today's visit and Hospice referral.  Call Transferred to Dr. Alvy Bimler.

## 2013-12-04 ENCOUNTER — Ambulatory Visit: Payer: Self-pay | Admitting: Cardiology

## 2013-12-04 ENCOUNTER — Telehealth: Payer: Self-pay | Admitting: *Deleted

## 2013-12-04 DIAGNOSIS — I639 Cerebral infarction, unspecified: Secondary | ICD-10-CM

## 2013-12-04 NOTE — Telephone Encounter (Signed)
Per Dr. Alvy Bimler office note, verbal order given to Brandon Melnick hospice nurse for DNR.

## 2013-12-04 NOTE — Telephone Encounter (Signed)
PT. IS LETHARGIC. HIS LAST MORPHINE DOSE WAS YESTERDAY AFTERNOON. THIS MORNING PT. STATED HE WAS IN PAIN BUT REFUSED HIS PAIN MEDICATION. HE IS DIAPHORETIC. TEMPERATURE IS 99.4 AXILLARY. B/P 138/60 P 64 R 8 O2 SAT 94% WITH PERIODS OF APNEA.  HE IS HAVING DIFFICULTY SWALLOWING. PT.HAS NOT EATEN SINCE YESTERDAY MORNING. UNABLE TO TAKE ROUTINE MEDICATIONS. URINE OUTPUT IS 25CC/HR. HOSPICE NURSE STATES AN END OF LIFE SITUATION. SHE HAS SEVERAL REQUESTS. 1. AN ORDER FOR TYLENOL- PT.'S FEVER/ 2. TO STOP PT.'S LANTUS INSULIN LAST DOSE WAS LAST NIGHT/ 3. PT.'S AMOXICILLIN IS IN TABLET FORM. IT HAS NOT BEEN STARTED. ORDER LIQUID FORM OR DISCONTINUE/ 4. CONTINUE ORDER TO CHANGE FOLEY TODAY OR LEAVE PRESENT FOLEY IN PLACE/ 5. AN ORDER FOR ATIVAN FOR TERMINAL AGITATION./ 6. [facility] REQUESTED THAT YESTERDAY'S ROXANOL QUANTITY OF 240ML BE DECREASED TO 90ML OR 60ML. THIS NOTE WAS ROUTED TO [place].

## 2013-12-07 ENCOUNTER — Encounter: Payer: Medicare Other | Admitting: Internal Medicine

## 2013-12-08 ENCOUNTER — Telehealth: Payer: Self-pay | Admitting: Cardiology

## 2013-12-08 NOTE — Telephone Encounter (Signed)
New problem   Pt's daughter has questions about her dad's medication he is suppose to be taking. Please call/

## 2013-12-08 NOTE — Telephone Encounter (Signed)
Pt's daughter, Lelon Frohlich, states pt's oncologist ,Dr Natale Lay, discontinued many of pt's medications last Thursday.  He is a Hospice patient. Medications that were discontinued include amlodipine,amiodarone,lasix,tamsulosin,finesteride, and crestor.  She was not given a reason they were being discontinued and states as far as she knows his pulse and BP have been OK.  She wants Dr Mare Ferrari to be aware of this.   I will forward to Dr Mare Ferrari for review.

## 2013-12-09 NOTE — Telephone Encounter (Signed)
In view of his situation I agree with holding those medicines

## 2013-12-09 NOTE — Telephone Encounter (Signed)
Notified that Dr. Mare Ferrari agreed with holding the medications.

## 2013-12-15 DIAGNOSIS — I6350 Cerebral infarction due to unspecified occlusion or stenosis of unspecified cerebral artery: Secondary | ICD-10-CM

## 2013-12-17 ENCOUNTER — Telehealth: Payer: Self-pay | Admitting: Neurology

## 2013-12-17 ENCOUNTER — Telehealth: Payer: Self-pay | Admitting: *Deleted

## 2013-12-17 NOTE — Telephone Encounter (Signed)
Patient's son concerned with patient being placed in Hospice, stated Dr. Leonie Man suggested during hospital stay they he go to [facility] facility.  Patient was discharged from ICU to home care on 11/20/13 and son is concerned/questioning why he wasn't sent to Rehab after ICU.  Please return call at anytime and may leave message  If not available.  Contact # [phone] Peter Minium).

## 2013-12-17 NOTE — Telephone Encounter (Signed)
Seems like the oncologist saw the patient and recommended hospice due to refractory prostate cancer with bony metastasis and even saw the patient in office 12/03/13. Ask son to speak to oncologist Dr Heath Lark

## 2013-12-17 NOTE — Telephone Encounter (Signed)
Son in Sports coach calling on his wife, Webb Silversmith, behalf.  States Webb Silversmith is questioning whether enrolling pt in Hospice was the correct decision?  She is struggling with this decision.   Shanon Brow reports pt continues to be bed bound, barely able to swallow his medications and confused at times.   Informed Shanon Brow that any patient in this condition is not a candidate for any type of chemotherapy.  Discussed that treatment is not option so his wife does not have this decision to make.  She can decide whether or not they want to continue Hospice services and encouraged her to speak with Hospice SW about her concerns.   Asked him to call back if his wife wants to speak with Dr. Alvy Bimler.  He verbalized understanding.

## 2013-12-21 ENCOUNTER — Telehealth: Payer: Self-pay | Admitting: *Deleted

## 2013-12-21 ENCOUNTER — Other Ambulatory Visit (HOSPITAL_BASED_OUTPATIENT_CLINIC_OR_DEPARTMENT_OTHER): Payer: Medicare Other | Admitting: Hematology and Oncology

## 2013-12-21 ENCOUNTER — Ambulatory Visit: Payer: Medicare Other

## 2013-12-21 DIAGNOSIS — C61 Malignant neoplasm of prostate: Secondary | ICD-10-CM

## 2013-12-21 DIAGNOSIS — R300 Dysuria: Secondary | ICD-10-CM

## 2013-12-21 LAB — URINALYSIS, MICROSCOPIC - CHCC
Bilirubin (Urine): NEGATIVE
Glucose: NEGATIVE mg/dL
Ketones: 5 mg/dL
NITRITE: POSITIVE
Protein: 300 mg/dL
Specific Gravity, Urine: 1.025 (ref 1.003–1.035)
UROBILINOGEN UR: 0.2 mg/dL (ref 0.2–1)
pH: 6 (ref 4.6–8.0)

## 2013-12-21 MED ORDER — CIPROFLOXACIN 500 MG/5ML (10%) PO SUSR: 250.0000 mg | Freq: Two times a day (BID) | ORAL | Status: AC

## 2013-12-21 NOTE — Telephone Encounter (Signed)
Message copied by Cathlean Cower on Mon Dec 21, 2013  3:31 PM ------      Message from: [facility], [place]      Created: Mon Dec 21, 2013  2:48 PM      Regarding: Urine culture positive       Pls call in cipro 250 mg BID PO X 7 days and change catheter      ----- Message -----         From: [facility]         Sent: 12/21/2013   2:32 PM           To: Heath Lark, MD                   ------

## 2013-12-21 NOTE — Telephone Encounter (Signed)
Informed Hospice RN of need to change catheter again per Dr. Alvy Bimler.  She verbalized understanding.

## 2013-12-21 NOTE — Telephone Encounter (Signed)
Call from Hospice RN states family is concerned about pt's urine. Appears he may have UTI and they would like this addressed,  Family wants Urine sent to lab for testing.  Hospice RN states pt does have some blood and purulent drainage from penis around foley catheter.  Urine is cloudy with sediment.  Pt is afebrile.  She asks if Dr. Alvy Bimler wants to order u/a?    Pt is lethargic with a lot of agitation.  He has order from Hospice MD for ativan but it does not seem to be working very well.  Instructed Hospice RN to discuss agitation and comfort meds w/ Hospice MD. She verbalized understanding.  Per Dr. Alvy Bimler,  [facility] for RN to bring in urine for u/a and c&s.  Notified Beatrix Fetters and she will collect urine and bring to our lab.

## 2013-12-21 NOTE — Telephone Encounter (Signed)
No, that needs to be changed again otherwise the antibiotics would not work

## 2013-12-21 NOTE — Telephone Encounter (Signed)
Cipro Suspension 500 mg/ml ordered at [facility].  They can get the suspension tomorrow.  Family requested a liquid so it will be easier for pt to swallow.    Notified Hospice RN about Cipro and order to change catheter.  She states she changed the catheter one week ago due to pt had latex allergy and she changed it out to silicone catheter.  States pt had a lot of discomfort w/ catheter change so wants to confirm if ok to not change it since just changed one week ago?

## 2013-12-23 LAB — URINE CULTURE

## 2013-12-23 NOTE — Telephone Encounter (Signed)
Shared Dr Clydene Fake  message with patient's son(James),he verbalized understanding and said that he would speak with Dr Alvy Bimler.

## 2013-12-28 ENCOUNTER — Telehealth: Payer: Self-pay | Admitting: Cardiology

## 2013-12-28 ENCOUNTER — Encounter: Payer: Medicare Other | Admitting: *Deleted

## 2013-12-28 NOTE — Telephone Encounter (Signed)
Confirmed remote transmission with pt daughter.  

## 2013-12-29 ENCOUNTER — Encounter: Payer: Self-pay | Admitting: Cardiology

## 2013-12-29 NOTE — Telephone Encounter (Signed)
SPOKE WITH  DAUGHTER   WOULD  LIKE TO DISCUSS  PT'S  CARE  WITH DR  BRACKBILL AND  MELINDA NOT  SURE  IF HOSPICE  IS  RIGHT  FOR  PT  HAS  BEEN ENROLLED  IN HOSPICE FOR APPROX  MONTH WILL FORWARD TO  [place] AND DR  BRACKBILL  FOR  REVIEW./CY

## 2013-12-29 NOTE — Telephone Encounter (Signed)
NEW MESSAGE  PT DAUGHTER CALLED. REPORTS THE PT IS IN HOSPICE. REQUESTS A CALL BACK. PLEASE CALL

## 2013-12-30 ENCOUNTER — Ambulatory Visit: Payer: Medicare Other | Admitting: Cardiology

## 2013-12-30 NOTE — Telephone Encounter (Signed)
Spoke with Webb Silversmith patients daughter and  Dr. Mare Ferrari will call her later today

## 2013-12-31 ENCOUNTER — Ambulatory Visit: Payer: Medicare Other

## 2013-12-31 ENCOUNTER — Other Ambulatory Visit: Payer: Medicare Other

## 2013-12-31 NOTE — Telephone Encounter (Signed)
Dr. Mare Ferrari spoke with daughter yesterday

## 2014-01-01 ENCOUNTER — Telehealth: Payer: Self-pay | Admitting: *Deleted

## 2014-01-01 NOTE — Telephone Encounter (Signed)
VM from [place] reports she initiated Atropine orders from [facility] Standing Orders as pt is actively dying.  She wanted Dr. Alvy Bimler to be aware.

## 2014-01-07 ENCOUNTER — Other Ambulatory Visit: Payer: Self-pay | Admitting: Hematology and Oncology

## 2014-01-19 DEATH — deceased

## 2014-01-26 ENCOUNTER — Ambulatory Visit: Payer: Medicare Other | Admitting: Neurology

## 2014-01-28 ENCOUNTER — Ambulatory Visit: Payer: Medicare Other

## 2014-01-28 ENCOUNTER — Other Ambulatory Visit: Payer: Medicare Other

## 2014-02-22 ENCOUNTER — Telehealth: Payer: Self-pay | Admitting: Cardiology

## 2014-02-22 NOTE — Telephone Encounter (Signed)
New message     Orders were faxed last week on a date prior to pts death.  They want to make sure you received them.  Can you please fax them back today.  Today is their end of their fiscal year and would like it back today if possible. Please call with the status on the orders

## 2014-02-22 NOTE — Telephone Encounter (Signed)
Spoke with Anderson Malta and faxed over form requested

## 2014-02-25 ENCOUNTER — Other Ambulatory Visit: Payer: Medicare Other

## 2014-02-25 ENCOUNTER — Ambulatory Visit: Payer: Medicare Other

## 2014-03-25 ENCOUNTER — Other Ambulatory Visit: Payer: Medicare Other

## 2014-03-25 ENCOUNTER — Ambulatory Visit: Payer: Medicare Other | Admitting: Hematology and Oncology

## 2014-03-25 ENCOUNTER — Ambulatory Visit: Payer: Medicare Other

## 2016-01-20 DEATH — deceased
